# Patient Record
Sex: Female | Born: 1960 | Race: White | Hispanic: No | Marital: Married | Smoking: Current every day smoker
Health system: Southern US, Community
[De-identification: ages and names within clinical notes are randomized; demographics above are authoritative.]

## PROBLEM LIST (undated history)

## (undated) DIAGNOSIS — M1990 Unspecified osteoarthritis, unspecified site: Secondary | ICD-10-CM

## (undated) DIAGNOSIS — E559 Vitamin D deficiency, unspecified: Secondary | ICD-10-CM

## (undated) DIAGNOSIS — F419 Anxiety disorder, unspecified: Secondary | ICD-10-CM

## (undated) DIAGNOSIS — R011 Cardiac murmur, unspecified: Secondary | ICD-10-CM

## (undated) DIAGNOSIS — K219 Gastro-esophageal reflux disease without esophagitis: Secondary | ICD-10-CM

## (undated) DIAGNOSIS — I10 Essential (primary) hypertension: Secondary | ICD-10-CM

## (undated) HISTORY — DX: Unspecified osteoarthritis, unspecified site: M19.90

## (undated) HISTORY — DX: Essential (primary) hypertension: I10

## (undated) HISTORY — PX: BREAST CYST ASPIRATION: SHX578

## (undated) HISTORY — PX: BREAST BIOPSY: SHX20

## (undated) HISTORY — DX: Vitamin D deficiency, unspecified: E55.9

## (undated) HISTORY — PX: DILATION AND CURETTAGE OF UTERUS: SHX78

## (undated) HISTORY — PX: UTERINE FIBROID EMBOLIZATION: SHX825

## (undated) HISTORY — PX: TUBAL LIGATION: SHX77

## (undated) HISTORY — DX: Anxiety disorder, unspecified: F41.9

## (undated) HISTORY — PX: CHOLECYSTECTOMY: SHX55

## (undated) HISTORY — PX: SHOULDER ARTHROSCOPY: SHX128

## (undated) HISTORY — DX: Cardiac murmur, unspecified: R01.1

## (undated) HISTORY — PX: ABDOMINAL HYSTERECTOMY: SHX81

## (undated) HISTORY — PX: BREAST SURGERY: SHX581

---

## 2012-04-23 ENCOUNTER — Ambulatory Visit: Payer: Self-pay | Admitting: Family Medicine

## 2012-04-27 ENCOUNTER — Emergency Department: Payer: Self-pay | Admitting: Emergency Medicine

## 2012-04-27 LAB — URINALYSIS, COMPLETE
Bilirubin,UR: NEGATIVE
Glucose,UR: NEGATIVE mg/dL (ref 0–75)
Ketone: NEGATIVE
Ph: 5 (ref 4.5–8.0)
Protein: 30
Squamous Epithelial: 3
WBC UR: 2 /HPF (ref 0–5)

## 2012-04-27 LAB — COMPREHENSIVE METABOLIC PANEL
Alkaline Phosphatase: 95 U/L (ref 50–136)
Anion Gap: 7 (ref 7–16)
BUN: 9 mg/dL (ref 7–18)
Calcium, Total: 8.6 mg/dL (ref 8.5–10.1)
Chloride: 104 mmol/L (ref 98–107)
Co2: 25 mmol/L (ref 21–32)
EGFR (African American): >60
Glucose: 106 mg/dL — ABNORMAL HIGH (ref 65–99)
Osmolality: 271 (ref 275–301)
Potassium: 3.3 mmol/L — ABNORMAL LOW (ref 3.5–5.1)
SGOT(AST): 32 U/L (ref 15–37)
SGPT (ALT): 32 U/L (ref 12–78)

## 2012-04-27 LAB — CBC
HCT: 45.3 % (ref 35.0–47.0)
HGB: 15.3 g/dL (ref 12.0–16.0)
MCH: 29.6 pg (ref 26.0–34.0)
MCHC: 33.9 g/dL (ref 32.0–36.0)
MCV: 87 fL (ref 80–100)
RBC: 5.19 10*6/uL (ref 3.80–5.20)
WBC: 4.7 10*3/uL (ref 3.6–11.0)

## 2014-06-03 ENCOUNTER — Ambulatory Visit
Admit: 2014-06-03 | Disposition: A | Discharge status: Home / Self Care | Payer: Self-pay | Attending: Orthopedic Surgery | Admitting: Orthopedic Surgery

## 2014-06-06 ENCOUNTER — Ambulatory Visit
Admit: 2014-06-06 | Disposition: A | Discharge status: Home / Self Care | Payer: Self-pay | Attending: Family Medicine | Admitting: Family Medicine

## 2014-07-26 DIAGNOSIS — M5136 Other intervertebral disc degeneration, lumbar region: Secondary | ICD-10-CM | POA: Insufficient documentation

## 2014-07-26 DIAGNOSIS — M51369 Other intervertebral disc degeneration, lumbar region without mention of lumbar back pain or lower extremity pain: Secondary | ICD-10-CM | POA: Insufficient documentation

## 2014-08-30 ENCOUNTER — Encounter: Payer: Self-pay | Admitting: Family Medicine

## 2014-08-30 ENCOUNTER — Ambulatory Visit (INDEPENDENT_AMBULATORY_CARE_PROVIDER_SITE_OTHER): Payer: Managed Care, Other (non HMO) | Admitting: Family Medicine

## 2014-08-30 VITALS — BP 120/69 | HR 92 | Temp 98.2°F | Resp 18 | Ht 63.0 in | Wt 191.2 lb

## 2014-08-30 DIAGNOSIS — F419 Anxiety disorder, unspecified: Secondary | ICD-10-CM

## 2014-08-30 DIAGNOSIS — M2550 Pain in unspecified joint: Secondary | ICD-10-CM | POA: Diagnosis not present

## 2014-08-30 DIAGNOSIS — F329 Major depressive disorder, single episode, unspecified: Secondary | ICD-10-CM

## 2014-08-30 DIAGNOSIS — I10 Essential (primary) hypertension: Secondary | ICD-10-CM

## 2014-08-30 DIAGNOSIS — F418 Other specified anxiety disorders: Secondary | ICD-10-CM | POA: Diagnosis not present

## 2014-08-30 DIAGNOSIS — R011 Cardiac murmur, unspecified: Secondary | ICD-10-CM | POA: Insufficient documentation

## 2014-08-30 DIAGNOSIS — E785 Hyperlipidemia, unspecified: Secondary | ICD-10-CM | POA: Insufficient documentation

## 2014-08-30 MED ORDER — AMLODIPINE BESYLATE 10 MG PO TABS: 10.0000 mg | ORAL_TABLET | Freq: Every day | ORAL | Status: DC

## 2014-08-30 MED ORDER — FLUOXETINE HCL 40 MG PO CAPS: 40.0000 mg | ORAL_CAPSULE | Freq: Every day | ORAL | Status: DC

## 2014-08-30 NOTE — Progress Notes (Signed)
Name: Nichole Wells   MRN: [id number]    DOB: 03-Feb-1961   Date:08/30/2014       Progress Note  Subjective  Chief Complaint  Chief Complaint  Patient presents with  . Back Pain  . Hypertension  . Pain    Joint pain (all over)    Hypertension This is a chronic problem. Associated symptoms include malaise/fatigue and palpitations (occasional palpitations). Pertinent negatives include no chest pain or headaches. Past treatments include calcium channel blockers, diuretics and beta blockers. The current treatment provides significant improvement. There is no history of kidney disease, CAD/MI or CVA.  Depression Pt. Is here for follow up of Depression. Initially diagnosed over 15 years ago. Currently on Fluoxetine 40 mg daily for 7-8 years. Her symptoms included hopelessness, crying spells, anxiety, and anhedonia. These are now controlled on medication. No side effects reported.  Generalized Joint Pain Pt. describes symptoms of joint aches and pains all over including both hands, both wrists, both feet, and lower back. She feels as if ' my body is attacking my joints' and as if ' I am coming down with something'. She denies any fevers or chills but does complain of fatigue.  Past Medical History  Diagnosis Date  . Heart murmur   . Anxiety   . Hypertension     Past Surgical History  Procedure Laterality Date  . Breast surgery    . Dilation and curettage of uterus    . Cholecystectomy    . Uterine fibroid embolization    . Shoulder arthroscopy Right   . Tubal ligation      Family History  Problem Relation Age of Onset  . Cancer Mother     cervical, lung  . Heart disease Father   . Hypertension Father     History   Social History  . Marital Status: Married    Spouse Name: N/A  . Number of Children: N/A  . Years of Education: N/A   Occupational History  . Not on file.   Social History Main Topics  . Smoking status: Current Every Day Smoker -- 0.50 packs/day    Types:  Cigarettes  . Smokeless tobacco: Never Used  . Alcohol Use: 0.0 oz/week    0 Standard drinks or equivalent per week     Comment: Occasional  . Drug Use: No  . Sexual Activity: Yes     Comment: occasional   Other Topics Concern  . Not on file   Social History Narrative  . No narrative on file     Current outpatient prescriptions:  .  amLODipine (NORVASC) 10 MG tablet, Take 1 tablet by mouth daily., Disp: , Rfl: 5 .  FLUoxetine (PROZAC) 40 MG capsule, Take 1 capsule by mouth daily., Disp: , Rfl: 5 .  hydrochlorothiazide (HYDRODIURIL) 12.5 MG tablet, Take 1 tablet by mouth daily., Disp: , Rfl: 5 .  hydrOXYzine (ATARAX/VISTARIL) 25 MG tablet, Take 1 tablet by mouth daily., Disp: , Rfl: 5 .  metoprolol succinate (TOPROL-XL) 50 MG 24 hr tablet, Take 1 tablet by mouth daily., Disp: , Rfl: 5 .  naproxen sodium (ANAPROX) 550 MG tablet, TK 1 T PO Q 12 H, Disp: , Rfl: 0 .  pravastatin (PRAVACHOL) 40 MG tablet, Take 1 tablet by mouth daily., Disp: , Rfl: 5  Allergies  Allergen Reactions  . Penicillin G Hives  . Penicillins      Review of Systems  Constitutional: Positive for malaise/fatigue. Negative for fever, chills and weight loss.  Cardiovascular: Positive  for palpitations (occasional palpitations). Negative for chest pain.  Musculoskeletal: Positive for back pain and joint pain.  Neurological: Negative for headaches.  Psychiatric/Behavioral: Positive for depression. The patient is nervous/anxious.       Objective  Filed Vitals:   08/30/14 1358  BP: 120/69  Pulse: 92  Temp: 98.2 F (36.8 C)  TempSrc: Oral  Resp: 18  Height: 5' 3" (1.6 m)  Weight: 191 lb 3.2 oz (86.728 kg)  SpO2: 95%    Physical Exam  Constitutional: She is oriented to person, place, and time and well-developed, well-nourished, and in no distress.  HENT:  Head: Normocephalic and atraumatic.  Cardiovascular: Normal rate and regular rhythm.   No murmur heard. Pulmonary/Chest: Effort normal.    Abdominal: Soft. Bowel sounds are normal.  Musculoskeletal:       Back:       Hands:      Feet:  Neurological: She is alert and oriented to person, place, and time.  Skin: Skin is warm and dry.  Psychiatric: Memory, affect and judgment normal.  Nursing note and vitals reviewed.    Assessment & Plan 1. Essential hypertension BP is stable on present therapy. - amLODipine (NORVASC) 10 MG tablet; Take 1 tablet (10 mg total) by mouth daily.  Dispense: 90 tablet; Refill: 0  2. Anxiety and depression Stable on present therapy. - FLUoxetine (PROZAC) 40 MG capsule; Take 1 capsule (40 mg total) by mouth daily.  Dispense: 90 capsule; Refill: 0  3. Pain in joint involving multiple sites Order labs and X rays for evaluation of multiple joint pains. Pt. Has family history of autoimmune disease. - DG Hand Complete Left; Future - DG Foot Complete Left; Future - DG Hand Complete Right; Future - Antinuclear Antib (ANA) - Rheumatoid Factor - Sed Rate (ESR) - CBC w/Diff - C-reactive protein    Syed Asad A. Shah [facility] 08/30/2014 2:35 PM  

## 2014-08-31 ENCOUNTER — Telehealth: Payer: Self-pay | Admitting: Family Medicine

## 2014-08-31 ENCOUNTER — Ambulatory Visit
Admission: RE | Admit: 2014-08-31 | Discharge: 2014-08-31 | Disposition: A | Discharge status: Home / Self Care | Payer: Managed Care, Other (non HMO) | Source: Ambulatory Visit | Attending: Family Medicine | Admitting: Family Medicine

## 2014-08-31 DIAGNOSIS — M2550 Pain in unspecified joint: Secondary | ICD-10-CM

## 2014-08-31 LAB — CBC WITH DIFFERENTIAL/PLATELET
BASOS: 0 %
Basophils Absolute: 0 10*3/uL (ref 0.0–0.2)
EOS (ABSOLUTE): 0.1 10*3/uL (ref 0.0–0.4)
EOS: 1 %
HEMATOCRIT: 42.8 % (ref 34.0–46.6)
Hemoglobin: 14.3 g/dL (ref 11.1–15.9)
IMMATURE GRANS (ABS): 0 10*3/uL (ref 0.0–0.1)
Immature Granulocytes: 0 %
LYMPHS ABS: 2.7 10*3/uL (ref 0.7–3.1)
LYMPHS: 25 %
MCH: 29.8 pg (ref 26.6–33.0)
MCHC: 33.4 g/dL (ref 31.5–35.7)
MCV: 89 fL (ref 79–97)
MONOCYTES: 10 %
Monocytes Absolute: 1 10*3/uL — ABNORMAL HIGH (ref 0.1–0.9)
NEUTROS PCT: 64 %
Neutrophils Absolute: 7 10*3/uL (ref 1.4–7.0)
Platelets: 387 10*3/uL — ABNORMAL HIGH (ref 150–379)
RBC: 4.8 x10E6/uL (ref 3.77–5.28)
RDW: 13.8 % (ref 12.3–15.4)
WBC: 10.9 10*3/uL — ABNORMAL HIGH (ref 3.4–10.8)

## 2014-08-31 LAB — SEDIMENTATION RATE: Sed Rate: 4 mm/hr (ref 0–40)

## 2014-08-31 LAB — ANA: Anti Nuclear Antibody(ANA): NEGATIVE

## 2014-08-31 LAB — C-REACTIVE PROTEIN: CRP: 10.9 mg/L — AB (ref 0.0–4.9)

## 2014-08-31 LAB — RHEUMATOID FACTOR: RHEUMATOID FACTOR: 10.7 [IU]/mL (ref 0.0–13.9)

## 2014-08-31 NOTE — Telephone Encounter (Signed)
PT NEEDS ALL THE MEDS THAT YOU SENT FOR HER YESTERDAY TO [facility]UE TO INSURANCE CHANGE SHE DID NOT KNOW ABOUT. ALL OF THESE NEED TO BE 90 DAYS SUPPLY.  ALSO PT NEEDS REFILLS ON PAXIL ( NEEDS GENERIC) AND THE OTHER ONE IS BLOOD PRESSURE MEDS . SHE SAID TO MAKE IT EASIER ALL OF HER RX WILL NEED NEW RX'S FOR 90 DAY SUPPLY DUE TO INSURANCE CHANGE. [facility]

## 2014-09-01 ENCOUNTER — Other Ambulatory Visit: Payer: Self-pay | Admitting: Family Medicine

## 2014-09-01 DIAGNOSIS — M2550 Pain in unspecified joint: Secondary | ICD-10-CM

## 2014-09-04 ENCOUNTER — Other Ambulatory Visit: Payer: Self-pay | Admitting: Family Medicine

## 2014-09-04 DIAGNOSIS — F419 Anxiety disorder, unspecified: Secondary | ICD-10-CM

## 2014-09-04 DIAGNOSIS — I10 Essential (primary) hypertension: Secondary | ICD-10-CM

## 2014-09-04 DIAGNOSIS — E785 Hyperlipidemia, unspecified: Secondary | ICD-10-CM

## 2014-09-04 DIAGNOSIS — L299 Pruritus, unspecified: Secondary | ICD-10-CM

## 2014-09-04 DIAGNOSIS — F329 Major depressive disorder, single episode, unspecified: Secondary | ICD-10-CM

## 2014-09-04 NOTE — Telephone Encounter (Signed)
Medication has been refilled and sent to [place]

## 2014-09-04 NOTE — Telephone Encounter (Signed)
Patient called over the weekend requesting medication refills be sent to another pharmacy due to insurance. She was given a 7 day supply per Dr. Brigitte Pulse I am refilling medication and sending to [facility] [address]

## 2014-09-05 ENCOUNTER — Telehealth: Payer: Self-pay | Admitting: Family Medicine

## 2014-09-05 MED ORDER — HYDROCHLOROTHIAZIDE 12.5 MG PO TABS: 12.5000 mg | ORAL_TABLET | Freq: Every day | ORAL | Status: DC

## 2014-09-05 MED ORDER — PRAVASTATIN SODIUM 40 MG PO TABS: 40.0000 mg | ORAL_TABLET | Freq: Every day | ORAL | Status: DC

## 2014-09-05 MED ORDER — HYDROXYZINE HCL 25 MG PO TABS: 25.0000 mg | ORAL_TABLET | Freq: Every day | ORAL | Status: DC

## 2014-09-05 MED ORDER — FLUOXETINE HCL 40 MG PO CAPS: 40.0000 mg | ORAL_CAPSULE | Freq: Every day | ORAL | Status: DC

## 2014-09-05 MED ORDER — AMLODIPINE BESYLATE 10 MG PO TABS: 10.0000 mg | ORAL_TABLET | Freq: Every day | ORAL | Status: DC

## 2014-09-05 MED ORDER — METOPROLOL SUCCINATE ER 50 MG PO TB24: 50.0000 mg | ORAL_TABLET | Freq: Every day | ORAL | Status: DC

## 2014-09-05 NOTE — Telephone Encounter (Signed)
THIS PATIENT AND HER HUSBAND ARE PATIENTS OF [facility] AND THEY ARE ASKING TO SWITCH TO YOU INSTEAD. PLEASE LET ME KNOW IF THIS IS POSSIBLE SO THAT I CAN CALL AND LET THEM KNOW. SAYS THAT WHEN THEY NEED THINGS JUST DOES NOT SEEM TO GET DONE.

## 2014-09-06 ENCOUNTER — Telehealth: Payer: Self-pay | Admitting: Family Medicine

## 2014-09-06 NOTE — Telephone Encounter (Signed)
Cathy from [facility] (Rheumotoid) has scheduled the patient for Sept 1 @ 8:30. They are needing the xray report anything pertaining to joint pain in hand and feet. Please fax to [phone]

## 2014-09-06 NOTE — Telephone Encounter (Signed)
As long as they understand I do not manage chronic pain medication refills I will take over their care.  I will need to see them both at some point to transition care to me in 30 min appointments each if they have already been seen in the [facility] system.

## 2014-09-06 NOTE — Telephone Encounter (Signed)
Faxed x-rays to [facility]-rheumatology as requested to [phone]

## 2014-09-07 NOTE — Telephone Encounter (Signed)
SPOKE WITH JOHN THE HUSBAND AND HE IS GOING TO CALL ME BACK AND SET HIM AND HIS WIFE UP WITH AN APPT TO TRANS ION CARE WITH YOU. WAS MADE AWARE OF THE CHRONIC PAIN MEDICATION AND HE SAID THAT WAS FINE THAT HIS WIFE IS JUST ON ANXIETY MEDS.

## 2014-09-20 ENCOUNTER — Ambulatory Visit: Payer: Managed Care, Other (non HMO) | Admitting: Family Medicine

## 2014-11-29 ENCOUNTER — Other Ambulatory Visit: Payer: Self-pay | Admitting: Family Medicine

## 2015-02-24 ENCOUNTER — Other Ambulatory Visit: Payer: Self-pay | Admitting: Family Medicine

## 2015-03-13 ENCOUNTER — Telehealth: Payer: Self-pay | Admitting: Family Medicine

## 2015-03-13 MED ORDER — AMLODIPINE BESYLATE 10 MG PO TABS: 10.0000 mg | ORAL_TABLET | Freq: Every day | ORAL | Status: DC

## 2015-03-13 MED ORDER — HYDROCHLOROTHIAZIDE 12.5 MG PO TABS: 12.5000 mg | ORAL_TABLET | Freq: Every day | ORAL | Status: DC

## 2015-03-13 MED ORDER — METOPROLOL SUCCINATE ER 50 MG PO TB24: 50.0000 mg | ORAL_TABLET | Freq: Every day | ORAL | Status: DC

## 2015-03-13 NOTE — Telephone Encounter (Signed)
Medication has been refilled and [facility] [address] 

## 2015-03-13 NOTE — Telephone Encounter (Signed)
Patient called and scheduled appointment for 03-27-15 the first available time that she can come in, however she will be completely out of all her blood pressure medications. Is it possible to give her enough until her appointment date? If so please send [facility]

## 2015-03-27 ENCOUNTER — Encounter: Payer: Self-pay | Admitting: Family Medicine

## 2015-03-27 ENCOUNTER — Ambulatory Visit (INDEPENDENT_AMBULATORY_CARE_PROVIDER_SITE_OTHER): Payer: Managed Care, Other (non HMO) | Admitting: Family Medicine

## 2015-03-27 VITALS — BP 120/77 | HR 69 | Temp 98.6°F | Resp 17 | Ht 63.0 in | Wt 186.5 lb

## 2015-03-27 DIAGNOSIS — F329 Major depressive disorder, single episode, unspecified: Secondary | ICD-10-CM

## 2015-03-27 DIAGNOSIS — E785 Hyperlipidemia, unspecified: Secondary | ICD-10-CM

## 2015-03-27 DIAGNOSIS — F32A Depression, unspecified: Secondary | ICD-10-CM

## 2015-03-27 DIAGNOSIS — G4700 Insomnia, unspecified: Secondary | ICD-10-CM

## 2015-03-27 DIAGNOSIS — F419 Anxiety disorder, unspecified: Secondary | ICD-10-CM

## 2015-03-27 DIAGNOSIS — I10 Essential (primary) hypertension: Secondary | ICD-10-CM | POA: Diagnosis not present

## 2015-03-27 DIAGNOSIS — F418 Other specified anxiety disorders: Secondary | ICD-10-CM | POA: Diagnosis not present

## 2015-03-27 MED ORDER — HYDROCHLOROTHIAZIDE 12.5 MG PO TABS: 12.5000 mg | ORAL_TABLET | Freq: Every day | ORAL | Status: DC

## 2015-03-27 MED ORDER — SUVOREXANT 10 MG PO TABS: 1.0000 | ORAL_TABLET | Freq: Every day | ORAL | Status: DC

## 2015-03-27 MED ORDER — AMLODIPINE BESYLATE 10 MG PO TABS: 10.0000 mg | ORAL_TABLET | Freq: Every day | ORAL | Status: DC

## 2015-03-27 MED ORDER — PRAVASTATIN SODIUM 40 MG PO TABS: 40.0000 mg | ORAL_TABLET | Freq: Every day | ORAL | Status: DC

## 2015-03-27 MED ORDER — METOPROLOL SUCCINATE ER 50 MG PO TB24: 50.0000 mg | ORAL_TABLET | Freq: Every day | ORAL | Status: DC

## 2015-03-27 MED ORDER — FLUOXETINE HCL 40 MG PO CAPS: 40.0000 mg | ORAL_CAPSULE | Freq: Every day | ORAL | Status: DC

## 2015-03-27 NOTE — Progress Notes (Signed)
Name: Nichole Wells   MRN: [id number]    DOB: [birth year]   Date:03/27/2015       Progress Note  Subjective  Chief Complaint  Chief Complaint  Patient presents with  . Medication Refill    amlodipine 10 mg / hydrochlorothiazide 12.5 mg / metoprolol 50 mg / fluoxtine 40 mg / hydroxyzine 25 mg / pravastatin 40 mg     Hypertension This is a chronic problem. The problem is controlled. Pertinent negatives include no blurred vision, chest pain, headaches, palpitations or shortness of breath. Past treatments include diuretics, calcium channel blockers and beta blockers. There is no history of kidney disease or CAD/MI.  Hyperlipidemia This is a chronic problem. The problem is controlled. Pertinent negatives include no chest pain, leg pain, myalgias or shortness of breath. Current antihyperlipidemic treatment includes statins.  Insomnia Primary symptoms: sleep disturbance, difficulty falling asleep.  The onset quality is gradual. Past treatments include medication. Typical bedtime:  Shift work-varies.  PMH includes: hypertension, depression.  Depression        This is a chronic problem.The problem is unchanged.  Associated symptoms include insomnia.  Associated symptoms include no myalgias and no headaches.  Past treatments include SSRIs - Selective serotonin reuptake inhibitors.  Compliance with treatment is good.    Past Medical History  Diagnosis Date  . Heart murmur   . Anxiety   . Hypertension     Past Surgical History  Procedure Laterality Date  . Breast surgery    . Dilation and curettage of uterus    . Cholecystectomy    . Uterine fibroid embolization    . Shoulder arthroscopy Right   . Tubal ligation      Family History  Problem Relation Age of Onset  . Cancer Mother     cervical, lung  . Heart disease Father   . Hypertension Father     Social History   Social History  . Marital Status: Married    Spouse Name: N/A  . Number of Children: N/A  . Years of Education:  N/A   Occupational History  . Not on file.   Social History Main Topics  . Smoking status: Current Every Day Smoker -- 0.50 packs/day    Types: Cigarettes  . Smokeless tobacco: Never Used  . Alcohol Use: 0.0 oz/week    0 Standard drinks or equivalent per week     Comment: Occasional  . Drug Use: No  . Sexual Activity: Yes     Comment: occasional   Other Topics Concern  . Not on file   Social History Narrative     Current outpatient prescriptions:  .  amLODipine (NORVASC) 10 MG tablet, Take 1 tablet (10 mg total) by mouth daily., Disp: 30 tablet, Rfl: 0 .  FLUoxetine (PROZAC) 40 MG capsule, TAKE 1 CAPSULE (40 MG TOTAL) BY MOUTH DAILY., Disp: 90 capsule, Rfl: 0 .  hydrochlorothiazide (HYDRODIURIL) 12.5 MG tablet, Take 1 tablet (12.5 mg total) by mouth daily., Disp: 30 tablet, Rfl: 0 .  hydrOXYzine (ATARAX/VISTARIL) 25 MG tablet, TAKE 1 TABLET (25 MG TOTAL) BY MOUTH DAILY., Disp: 90 tablet, Rfl: 0 .  metoprolol succinate (TOPROL-XL) 50 MG 24 hr tablet, Take 1 tablet (50 mg total) by mouth daily. Take with or immediately following a meal., Disp: 30 tablet, Rfl: 0 .  naproxen sodium (ANAPROX) 550 MG tablet, TK 1 T PO Q 12 H, Disp: , Rfl: 0 .  pravastatin (PRAVACHOL) 40 MG tablet, TAKE 1 TABLET (40 MG TOTAL) BY MOUTH  DAILY., Disp: 90 tablet, Rfl: 0  Allergies  Allergen Reactions  . Penicillin G Hives  . Penicillins      Review of Systems  Eyes: Negative for blurred vision.  Respiratory: Negative for shortness of breath.   Cardiovascular: Negative for chest pain and palpitations.  Musculoskeletal: Negative for myalgias.  Neurological: Negative for headaches.  Psychiatric/Behavioral: Positive for depression and sleep disturbance. The patient has insomnia.     Objective  Filed Vitals:   03/27/15 1101  BP: 120/77  Pulse: 69  Temp: 98.6 F (37 C)  TempSrc: Oral  Resp: 17  Height: 5\' 3"  (1.6 m)  Weight: 186 lb 8 oz (84.596 kg)  SpO2: 95%    Physical Exam   Constitutional: She is oriented to person, place, and time and well-developed, well-nourished, and in no distress.  HENT:  Head: Normocephalic and atraumatic.  Cardiovascular: Normal rate and regular rhythm.   Murmur heard. Pulmonary/Chest: Effort normal and breath sounds normal.  Neurological: She is alert and oriented to person, place, and time.  Psychiatric: Mood, memory, affect and judgment normal.  Nursing note and vitals reviewed.   Assessment & Plan  1. Essential hypertension BP stable and controlled on therapy - amLODipine (NORVASC) 10 MG tablet; Take 1 tablet (10 mg total) by mouth daily.  Dispense: 30 tablet; Refill: 0 - hydrochlorothiazide (HYDRODIURIL) 12.5 MG tablet; Take 1 tablet (12.5 mg total) by mouth daily.  Dispense: 30 tablet; Refill: 0 - metoprolol succinate (TOPROL-XL) 50 MG 24 hr tablet; Take 1 tablet (50 mg total) by mouth daily. Take with or immediately following a meal.  Dispense: 30 tablet; Refill: 0  2. Anxiety and depression  - FLUoxetine (PROZAC) 40 MG capsule; Take 1 capsule (40 mg total) by mouth daily.  Dispense: 90 capsule; Refill: 0  3. HLD (hyperlipidemia)  - pravastatin (PRAVACHOL) 40 MG tablet; Take 1 tablet (40 mg total) by mouth daily.  Dispense: 90 tablet; Refill: 0 - Lipid Profile - Comprehensive Metabolic Panel (CMET)  4. Insomnia DC hydroxyzine and will start on Belsomra. Educated on potential adverse effects. Follow-up in one month. - Suvorexant (BELSOMRA) 10 MG TABS; Take 1 tablet by mouth at bedtime.  Dispense: 30 tablet; Refill: 0   Maitri Schnoebelen [facility] 03/27/2015 11:15 AM

## 2015-03-30 ENCOUNTER — Telehealth: Payer: Self-pay | Admitting: Family Medicine

## 2015-03-30 DIAGNOSIS — G4700 Insomnia, unspecified: Secondary | ICD-10-CM

## 2015-03-30 NOTE — Telephone Encounter (Signed)
Pt states she was told to call her insurance to see what they will cover and she found out that her insurance will cover Zolpidem , Zaloeplon.

## 2015-04-02 NOTE — Telephone Encounter (Signed)
Routed to Dr. Shah for advice  

## 2015-04-03 MED ORDER — ZOLPIDEM TARTRATE 5 MG PO TABS: 5.0000 mg | ORAL_TABLET | Freq: Every evening | ORAL | Status: DC | PRN

## 2015-04-03 NOTE — Telephone Encounter (Signed)
Routed to Dr. Shah for call back  

## 2015-04-03 NOTE — Telephone Encounter (Signed)
Returned call and discussed Ambien for insomnia. Recommended that she should take Ambien for a short term (1-2 months duration). Prescription is ready for pickup

## 2015-04-03 NOTE — Telephone Encounter (Signed)
Patient returning your call.

## 2015-04-10 ENCOUNTER — Telehealth: Payer: Self-pay | Admitting: Family Medicine

## 2015-04-10 NOTE — Telephone Encounter (Signed)
Called and spoke with pharmacy and medication has been changed to a 90- day supply on 04/10/2015

## 2015-04-10 NOTE — Telephone Encounter (Signed)
Pt states Amlopodine, Metlprolol ER, Hydrochlerozide, and cholesterol were all sent in for 30 days and her insurance will not cover it unless its 90 days. Pt requesting these be resent for 90 days. [facility]

## 2015-04-11 LAB — COMPREHENSIVE METABOLIC PANEL
A/G RATIO: 2 (ref 1.1–2.5)
ALT: 13 IU/L (ref 0–32)
AST: 19 IU/L (ref 0–40)
Albumin: 4.3 g/dL (ref 3.5–5.5)
Alkaline Phosphatase: 84 IU/L (ref 39–117)
BUN/Creatinine Ratio: 19 (ref 9–23)
BUN: 11 mg/dL (ref 6–24)
Bilirubin Total: 0.3 mg/dL (ref 0.0–1.2)
CALCIUM: 9.6 mg/dL (ref 8.7–10.2)
CO2: 25 mmol/L (ref 18–29)
CREATININE: 0.57 mg/dL (ref 0.57–1.00)
Chloride: 99 mmol/L (ref 96–106)
GFR, EST AFRICAN AMERICAN: 123 mL/min/{1.73_m2} (ref >59)
GFR, EST NON AFRICAN AMERICAN: 107 mL/min/{1.73_m2} (ref >59)
GLOBULIN, TOTAL: 2.2 g/dL (ref 1.5–4.5)
Glucose: 106 mg/dL — ABNORMAL HIGH (ref 65–99)
POTASSIUM: 4.6 mmol/L (ref 3.5–5.2)
SODIUM: 137 mmol/L (ref 134–144)
TOTAL PROTEIN: 6.5 g/dL (ref 6.0–8.5)

## 2015-04-11 LAB — LIPID PANEL
CHOLESTEROL TOTAL: 166 mg/dL (ref 100–199)
Chol/HDL Ratio: 3.1 ratio units (ref 0.0–4.4)
HDL: 53 mg/dL (ref >39)
LDL CALC: 93 mg/dL (ref 0–99)
TRIGLYCERIDES: 100 mg/dL (ref 0–149)
VLDL Cholesterol Cal: 20 mg/dL (ref 5–40)

## 2015-04-14 ENCOUNTER — Other Ambulatory Visit: Payer: Self-pay | Admitting: Family Medicine

## 2015-04-18 ENCOUNTER — Telehealth: Payer: Self-pay | Admitting: Family Medicine

## 2015-04-18 ENCOUNTER — Telehealth: Payer: Self-pay

## 2015-04-18 DIAGNOSIS — I10 Essential (primary) hypertension: Secondary | ICD-10-CM

## 2015-04-18 MED ORDER — AMLODIPINE BESYLATE 10 MG PO TABS: 10.0000 mg | ORAL_TABLET | Freq: Every day | ORAL | Status: DC

## 2015-04-18 MED ORDER — METOPROLOL SUCCINATE ER 50 MG PO TB24: 50.0000 mg | ORAL_TABLET | Freq: Every day | ORAL | Status: DC

## 2015-04-18 MED ORDER — HYDROCHLOROTHIAZIDE 12.5 MG PO TABS: 12.5000 mg | ORAL_TABLET | Freq: Every day | ORAL | Status: DC

## 2015-04-18 NOTE — Telephone Encounter (Signed)
Medication has been refilled with a 90-day supply and sent to pharmacy, patient has been notified

## 2015-04-18 NOTE — Telephone Encounter (Signed)
PT IS ASKING THAT YOU ALL CALL HER ABOUT HER MEDS. SHE IS NEEDING 90 DAY SUPPLY FOR THE INS WILL NOT COVER IT UNLESS IT IS 90 DAY SUPPLY. SAYS THAT SHE HAS REQUESTED THIS SEVERAL TIMES AND IT HAS NOT BEEN DONE YET AND SHE IS HAVING TO PAY OUT OF POCKET.

## 2015-04-18 NOTE — Telephone Encounter (Signed)
Medication has been refilled with a 90-day supply and [facility] [address] As requested by pharmacy

## 2015-04-24 ENCOUNTER — Ambulatory Visit: Payer: Managed Care, Other (non HMO) | Admitting: Family Medicine

## 2015-04-29 ENCOUNTER — Emergency Department: Payer: Managed Care, Other (non HMO)

## 2015-04-29 ENCOUNTER — Emergency Department
Admission: EM | Admit: 2015-04-29 | Discharge: 2015-04-29 | Disposition: A | Discharge status: Home / Self Care | Payer: Managed Care, Other (non HMO) | Attending: Emergency Medicine | Admitting: Emergency Medicine

## 2015-04-29 ENCOUNTER — Encounter: Payer: Self-pay | Admitting: *Deleted

## 2015-04-29 DIAGNOSIS — R109 Unspecified abdominal pain: Secondary | ICD-10-CM

## 2015-04-29 DIAGNOSIS — R1033 Periumbilical pain: Secondary | ICD-10-CM | POA: Diagnosis not present

## 2015-04-29 DIAGNOSIS — Z79899 Other long term (current) drug therapy: Secondary | ICD-10-CM | POA: Insufficient documentation

## 2015-04-29 DIAGNOSIS — I10 Essential (primary) hypertension: Secondary | ICD-10-CM | POA: Diagnosis not present

## 2015-04-29 DIAGNOSIS — R1013 Epigastric pain: Secondary | ICD-10-CM | POA: Diagnosis not present

## 2015-04-29 DIAGNOSIS — Z880 Allergy status to penicillin: Secondary | ICD-10-CM | POA: Diagnosis not present

## 2015-04-29 DIAGNOSIS — E785 Hyperlipidemia, unspecified: Secondary | ICD-10-CM | POA: Diagnosis not present

## 2015-04-29 DIAGNOSIS — F17210 Nicotine dependence, cigarettes, uncomplicated: Secondary | ICD-10-CM | POA: Insufficient documentation

## 2015-04-29 LAB — CBC WITH DIFFERENTIAL/PLATELET
BASOS ABS: 0 10*3/uL (ref 0–0.1)
Basophils Relative: 0 %
EOS ABS: 0 10*3/uL (ref 0–0.7)
EOS PCT: 0 %
HCT: 39.1 % (ref 35.0–47.0)
Hemoglobin: 13.5 g/dL (ref 12.0–16.0)
Lymphocytes Relative: 6 %
Lymphs Abs: 0.5 10*3/uL — ABNORMAL LOW (ref 1.0–3.6)
MCH: 29.7 pg (ref 26.0–34.0)
MCHC: 34.4 g/dL (ref 32.0–36.0)
MCV: 86.2 fL (ref 80.0–100.0)
Monocytes Absolute: 0.6 10*3/uL (ref 0.2–0.9)
Monocytes Relative: 7 %
Neutro Abs: 7.5 10*3/uL — ABNORMAL HIGH (ref 1.4–6.5)
Neutrophils Relative %: 87 %
PLATELETS: 287 10*3/uL (ref 150–440)
RBC: 4.54 MIL/uL (ref 3.80–5.20)
RDW: 14.6 % — AB (ref 11.5–14.5)
WBC: 8.7 10*3/uL (ref 3.6–11.0)

## 2015-04-29 LAB — COMPREHENSIVE METABOLIC PANEL
ALK PHOS: 75 U/L (ref 38–126)
ALT: 19 U/L (ref 14–54)
ANION GAP: 8 (ref 5–15)
AST: 26 U/L (ref 15–41)
Albumin: 3.7 g/dL (ref 3.5–5.0)
BILIRUBIN TOTAL: 0.4 mg/dL (ref 0.3–1.2)
BUN: 9 mg/dL (ref 6–20)
CALCIUM: 8.8 mg/dL — AB (ref 8.9–10.3)
CO2: 24 mmol/L (ref 22–32)
Chloride: 98 mmol/L — ABNORMAL LOW (ref 101–111)
Creatinine, Ser: 0.58 mg/dL (ref 0.44–1.00)
GFR calc Af Amer: >60 mL/min (ref >60)
GLUCOSE: 101 mg/dL — AB (ref 65–99)
Potassium: 3.2 mmol/L — ABNORMAL LOW (ref 3.5–5.1)
SODIUM: 130 mmol/L — AB (ref 135–145)
TOTAL PROTEIN: 6.4 g/dL — AB (ref 6.5–8.1)

## 2015-04-29 LAB — LIPASE, BLOOD: LIPASE: 36 U/L (ref 11–51)

## 2015-04-29 MED ORDER — GI COCKTAIL ~~LOC~~
ORAL | Status: AC
  Administered 2015-04-29: 30 mL via ORAL
  Filled 2015-04-29: qty 30

## 2015-04-29 MED ORDER — DICYCLOMINE HCL 20 MG PO TABS: 20.0000 mg | ORAL_TABLET | Freq: Three times a day (TID) | ORAL | Status: DC | PRN

## 2015-04-29 MED ORDER — GI COCKTAIL ~~LOC~~
30.0000 mL | Freq: Once | ORAL | Status: AC
  Administered 2015-04-29: 30 mL via ORAL

## 2015-04-29 MED ORDER — SODIUM CHLORIDE 0.9 % IV BOLUS (SEPSIS)
1000.0000 mL | Freq: Once | INTRAVENOUS | Status: AC
  Administered 2015-04-29: 1000 mL via INTRAVENOUS

## 2015-04-29 MED ORDER — SUCRALFATE 1 G PO TABS: 1.0000 g | ORAL_TABLET | Freq: Four times a day (QID) | ORAL | Status: DC

## 2015-04-29 NOTE — ED Notes (Signed)
Patient transported to x-ray. ?

## 2015-04-29 NOTE — ED Notes (Signed)
Patient c/o sinus pressure and cough for one week and was treated with cough med and Doxycycline. Patient c/o now of upper abdominal pain, back pain and fever.

## 2015-04-29 NOTE — Discharge Instructions (Signed)
Please seek medical attention for any high fevers, chest pain, shortness of breath, change in behavior, persistent vomiting, bloody stool or any other new or concerning symptoms. ° ° °Abdominal Pain, Adult °Many things can cause belly (abdominal) pain. Most times, the belly pain is not dangerous. Many cases of belly pain can be watched and treated at home. °HOME CARE  °· Do not take medicines that help you go poop (laxatives) unless told to by your doctor. °· Only take medicine as told by your doctor. °· Eat or drink as told by your doctor. Your doctor will tell you if you should be on a special diet. °GET HELP IF: °· You do not know what is causing your belly pain. °· You have belly pain while you are sick to your stomach (nauseous) or have runny poop (diarrhea). °· You have pain while you pee or poop. °· Your belly pain wakes you up at night. °· You have belly pain that gets worse or better when you eat. °· You have belly pain that gets worse when you eat fatty foods. °· You have a fever. °GET HELP RIGHT AWAY IF:  °· The pain does not go away within 2 hours. °· You keep throwing up (vomiting). °· The pain changes and is only in the right or left part of the belly. °· You have bloody or tarry looking poop. °MAKE SURE YOU:  °· Understand these instructions. °· Will watch your condition. °· Will get help right away if you are not doing well or get worse. °  °This information is not intended to replace advice given to you by your health care provider. Make sure you discuss any questions you have with your health care provider. °  °Document Released: 07/16/2007 Document Revised: 02/17/2014 Document Reviewed: 10/06/2012 °Elsevier Interactive Patient Education ©2016 [employer] ° °

## 2015-04-29 NOTE — ED Notes (Signed)
Patient was seen at [facility] on March 12th. There they dx her with a sinus infection and an URI. Patient was put on doxycycline 100mg  (1 tab 2x daily) and hydrocodone-chlorphen susp (48mL Q12H). Patient states she thinks her abd pain is from this medication.

## 2015-04-29 NOTE — ED Provider Notes (Signed)
[facility] Emergency Department Provider Note  ____________________________________________  Time seen: ~1315  I have reviewed the triage vital signs and the nursing notes.   HISTORY  Chief Complaint Abdominal Pain   History limited by: Not Limited   HPI Nichole Wells is a 55 y.o. female who presents to the emergency department today because of concerns for abdominal pain. This pain is located in the periumbilical and epigastric region. She describes it as cramping. It will become severe. She states it started after she was placed on antibiotics for sinus and upper respiratory infection. She has had some associated nausea. She denies any vomiting. Denies any diarrhea or bloody stool. She has had cough with brownish phlegm. No chest pain. No shortness breath.    Past Medical History  Diagnosis Date  . Heart murmur   . Anxiety   . Hypertension     Patient Active Problem List   Diagnosis Date Noted  . Insomnia 03/27/2015  . Hypertension 08/30/2014  . Anxiety and depression 08/30/2014  . Cardiac murmur 08/30/2014  . HLD (hyperlipidemia) 08/30/2014  . Pain in joint involving multiple sites 08/30/2014  . DDD (degenerative disc disease), lumbar 07/26/2014    Past Surgical History  Procedure Laterality Date  . Breast surgery    . Dilation and curettage of uterus    . Cholecystectomy    . Uterine fibroid embolization    . Shoulder arthroscopy Right   . Tubal ligation      Current Outpatient Rx  Name  Route  Sig  Dispense  Refill  . amLODipine (NORVASC) 10 MG tablet   Oral   Take 1 tablet (10 mg total) by mouth daily.   90 tablet   0   . FLUoxetine (PROZAC) 40 MG capsule   Oral   Take 1 capsule (40 mg total) by mouth daily.   90 capsule   0   . hydrochlorothiazide (HYDRODIURIL) 12.5 MG tablet   Oral   Take 1 tablet (12.5 mg total) by mouth daily.   90 tablet   0   . metoprolol succinate (TOPROL-XL) 50 MG 24 hr tablet   Oral   Take  1 tablet (50 mg total) by mouth daily. Take with or immediately following a meal.   30 tablet   0   . metoprolol succinate (TOPROL-XL) 50 MG 24 hr tablet   Oral   Take 1 tablet (50 mg total) by mouth daily. Take with or immediately following a meal.   90 tablet   0     WE NEED 90 DAY SUPPLYS FOR ALL MEDS   . naproxen sodium (ANAPROX) 550 MG tablet      TK 1 T PO Q 12 H      0   . pravastatin (PRAVACHOL) 40 MG tablet   Oral   Take 1 tablet (40 mg total) by mouth daily.   90 tablet   0   . zolpidem (AMBIEN) 5 MG tablet   Oral   Take 1 tablet (5 mg total) by mouth at bedtime as needed for sleep.   30 tablet   0     Allergies Penicillin g and Penicillins  Family History  Problem Relation Age of Onset  . Cancer Mother     cervical, lung  . Heart disease Father   . Hypertension Father     Social History Social History  Substance Use Topics  . Smoking status: Current Every Day Smoker -- 0.50 packs/day    Types:  Cigarettes  . Smokeless tobacco: Never Used  . Alcohol Use: 0.0 oz/week    0 Standard drinks or equivalent per week     Comment: Occasional    Review of Systems  Constitutional: Negative for fever. Cardiovascular: Negative for chest pain. Respiratory: Negative for shortness of breath. Positive for cough Gastrointestinal: Positive for abdominal pain Neurological: Negative for headaches, focal weakness or numbness.  10-point ROS otherwise negative.  ____________________________________________   PHYSICAL EXAM:  VITAL SIGNS: ED Triage Vitals  Enc Vitals Group     BP 04/29/15 1252 151/72 mmHg     Pulse Rate 04/29/15 1252 92     Resp 04/29/15 1252 18     Temp 04/29/15 1252 98.5 F (36.9 C)     Temp Source 04/29/15 1252 Oral     SpO2 04/29/15 1252 96 %     Weight 04/29/15 1252 183 lb (83.008 kg)     Height 04/29/15 1252 5\' 2"  (1.575 m)     Head Cir --      Peak Flow --      Pain Score 04/29/15 1255 8   Constitutional: Alert and oriented.  Well appearing and in no distress. Eyes: Conjunctivae are normal. PERRL. Normal extraocular movements. ENT   Head: Normocephalic and atraumatic.   Nose: No congestion/rhinnorhea.   Mouth/Throat: Mucous membranes are moist.   Neck: No stridor. Hematological/Lymphatic/Immunilogical: No cervical lymphadenopathy. Cardiovascular: Normal rate, regular rhythm.  No murmurs, rubs, or gallops. Respiratory: Normal respiratory effort without tachypnea nor retractions. Breath sounds are clear and equal bilaterally. No wheezes/rales/rhonchi. Gastrointestinal: Soft. Tender to palpation in the epigastric periumbilical regions. No rebound. No guarding. Genitourinary: Deferred Musculoskeletal: Normal range of motion in all extremities. No joint effusions.  No lower extremity tenderness nor edema. Neurologic:  Normal speech and language. No gross focal neurologic deficits are appreciated.  Skin:  Skin is warm, dry and intact. No rash noted. Psychiatric: Mood and affect are normal. Speech and behavior are normal. Patient exhibits appropriate insight and judgment.  ____________________________________________    LABS (pertinent positives/negatives)  Labs Reviewed  CBC WITH DIFFERENTIAL/PLATELET - Abnormal; Notable for the following:    RDW 14.6 (*)    Neutro Abs 7.5 (*)    Lymphs Abs 0.5 (*)    All other components within normal limits  COMPREHENSIVE METABOLIC PANEL - Abnormal; Notable for the following:    Sodium 130 (*)    Potassium 3.2 (*)    Chloride 98 (*)    Glucose, Bld 101 (*)    Calcium 8.8 (*)    Total Protein 6.4 (*)    All other components within normal limits  LIPASE, BLOOD     ____________________________________________   EKG  I, Nance Pear, attending physician, personally viewed and interpreted this EKG  EKG Time: 1256 Rate: 91 Rhythm: normal sinus rhythm Axis: normal Intervals: qtc 452 QRS: narrow ST changes: no st elevation Impression: normal  ekg  ____________________________________________    RADIOLOGY  CXR IMPRESSION: No acute abnormality. Mild chronic bronchitic changes.  ____________________________________________   PROCEDURES  Procedure(s) performed: None  Critical Care performed: No  ____________________________________________   INITIAL IMPRESSION / ASSESSMENT AND PLAN / ED COURSE  Pertinent labs & imaging results that were available during my care of the patient were reviewed by me and considered in my medical decision making (see chart for details).  Patient presented to the emergency department today because of concerns for abdominal pain. She recently started doxycycline for sinusitis and upper respiratory infection. Blood work here without  any leukocytosis, elevated lipase were elevated liver enzymes. Patient did have some improvement with GI cocktail. This point I think likely gastritis secondary to antibiotic use. I doubt intra-abdominal infection. Will discharge home with Bentyl and sucralfate.  ____________________________________________   FINAL CLINICAL IMPRESSION(S) / ED DIAGNOSES  Final diagnoses:  Abdominal pain, unspecified abdominal location     Nance Pear, MD 04/29/15 1533

## 2015-06-21 ENCOUNTER — Other Ambulatory Visit: Payer: Self-pay | Admitting: Family Medicine

## 2015-07-02 ENCOUNTER — Emergency Department
Admission: EM | Admit: 2015-07-02 | Discharge: 2015-07-02 | Disposition: A | Discharge status: Home / Self Care | Payer: Managed Care, Other (non HMO) | Attending: Emergency Medicine | Admitting: Emergency Medicine

## 2015-07-02 ENCOUNTER — Encounter: Payer: Self-pay | Admitting: Emergency Medicine

## 2015-07-02 DIAGNOSIS — F329 Major depressive disorder, single episode, unspecified: Secondary | ICD-10-CM | POA: Insufficient documentation

## 2015-07-02 DIAGNOSIS — E785 Hyperlipidemia, unspecified: Secondary | ICD-10-CM | POA: Diagnosis not present

## 2015-07-02 DIAGNOSIS — I10 Essential (primary) hypertension: Secondary | ICD-10-CM | POA: Diagnosis not present

## 2015-07-02 DIAGNOSIS — Y9389 Activity, other specified: Secondary | ICD-10-CM | POA: Insufficient documentation

## 2015-07-02 DIAGNOSIS — S61215A Laceration without foreign body of left ring finger without damage to nail, initial encounter: Secondary | ICD-10-CM | POA: Insufficient documentation

## 2015-07-02 DIAGNOSIS — M5136 Other intervertebral disc degeneration, lumbar region: Secondary | ICD-10-CM | POA: Diagnosis not present

## 2015-07-02 DIAGNOSIS — Z79899 Other long term (current) drug therapy: Secondary | ICD-10-CM | POA: Diagnosis not present

## 2015-07-02 DIAGNOSIS — Y929 Unspecified place or not applicable: Secondary | ICD-10-CM | POA: Insufficient documentation

## 2015-07-02 DIAGNOSIS — S61218A Laceration without foreign body of other finger without damage to nail, initial encounter: Secondary | ICD-10-CM

## 2015-07-02 DIAGNOSIS — F17210 Nicotine dependence, cigarettes, uncomplicated: Secondary | ICD-10-CM | POA: Insufficient documentation

## 2015-07-02 DIAGNOSIS — Y999 Unspecified external cause status: Secondary | ICD-10-CM | POA: Diagnosis not present

## 2015-07-02 DIAGNOSIS — Z791 Long term (current) use of non-steroidal anti-inflammatories (NSAID): Secondary | ICD-10-CM | POA: Insufficient documentation

## 2015-07-02 DIAGNOSIS — W4904XA Ring or other jewelry causing external constriction, initial encounter: Secondary | ICD-10-CM | POA: Insufficient documentation

## 2015-07-02 MED ORDER — BACITRACIN ZINC 500 UNIT/GM EX OINT
TOPICAL_OINTMENT | CUTANEOUS | Status: AC
  Filled 2015-07-02: qty 0.9

## 2015-07-02 MED ORDER — LIDOCAINE HCL (PF) 1 % IJ SOLN
INTRAMUSCULAR | Status: AC
  Filled 2015-07-02: qty 5

## 2015-07-02 MED ORDER — TETANUS-DIPHTH-ACELL PERTUSSIS 5-2.5-18.5 LF-MCG/0.5 IM SUSP
0.5000 mL | Freq: Once | INTRAMUSCULAR | Status: AC
  Administered 2015-07-02: 0.5 mL via INTRAMUSCULAR

## 2015-07-02 MED ORDER — LIDOCAINE HCL (PF) 1 % IJ SOLN: 5.0000 mL | Freq: Once | INTRAMUSCULAR | Status: DC

## 2015-07-02 MED ORDER — BACITRACIN ZINC 500 UNIT/GM EX OINT: 1.0000 "application " | TOPICAL_OINTMENT | Freq: Two times a day (BID) | CUTANEOUS | Status: DC

## 2015-07-02 MED ORDER — TETANUS-DIPHTH-ACELL PERTUSSIS 5-2.5-18.5 LF-MCG/0.5 IM SUSP
INTRAMUSCULAR | Status: AC
  Administered 2015-07-02: 0.5 mL via INTRAMUSCULAR
  Filled 2015-07-02: qty 0.5
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
## 2015-07-02 NOTE — ED Provider Notes (Signed)
[facility] Emergency Department Provider Note  ____________________________________________  Time seen: Approximately 8:26 AM  I have reviewed the triage vital signs and the nursing notes.   HISTORY  Chief Complaint Laceration   HPI Nichole Wells is a 55 y.o. female who presents to the emergency department for evaluation of a laceration that she sustained this morning. She went to take a frozen beer bottle out of the freezer and it exploded in her hand. She has a laceration to the medial aspect of her left ring finger. Bleeding is well controlled. Tetanus is possibly out of date.  Past Medical History  Diagnosis Date  . Heart murmur   . Anxiety   . Hypertension     Patient Active Problem List   Diagnosis Date Noted  . Insomnia 03/27/2015  . Hypertension 08/30/2014  . Anxiety and depression 08/30/2014  . Cardiac murmur 08/30/2014  . HLD (hyperlipidemia) 08/30/2014  . Pain in joint involving multiple sites 08/30/2014  . DDD (degenerative disc disease), lumbar 07/26/2014    Past Surgical History  Procedure Laterality Date  . Breast surgery    . Dilation and curettage of uterus    . Cholecystectomy    . Uterine fibroid embolization    . Shoulder arthroscopy Right   . Tubal ligation      Current Outpatient Rx  Name  Route  Sig  Dispense  Refill  . amLODipine (NORVASC) 10 MG tablet   Oral   Take 1 tablet (10 mg total) by mouth daily.   90 tablet   0   . dicyclomine (BENTYL) 20 MG tablet   Oral   Take 1 tablet (20 mg total) by mouth 3 (three) times daily as needed (abdominal pain).   30 tablet   0   . FLUoxetine (PROZAC) 40 MG capsule      TAKE 1 CAPSULE (40 MG TOTAL) BY MOUTH DAILY.   90 capsule   0   . hydrochlorothiazide (HYDRODIURIL) 12.5 MG tablet   Oral   Take 1 tablet (12.5 mg total) by mouth daily.   90 tablet   0   . metoprolol succinate (TOPROL-XL) 50 MG 24 hr tablet   Oral   Take 1 tablet (50 mg total) by mouth  daily. Take with or immediately following a meal.   30 tablet   0   . metoprolol succinate (TOPROL-XL) 50 MG 24 hr tablet   Oral   Take 1 tablet (50 mg total) by mouth daily. Take with or immediately following a meal.   90 tablet   0     WE NEED 90 DAY SUPPLYS FOR ALL MEDS   . naproxen sodium (ANAPROX) 550 MG tablet      TK 1 T PO Q 12 H      0   . pravastatin (PRAVACHOL) 40 MG tablet      TAKE 1 TABLET (40 MG TOTAL) BY MOUTH DAILY.   90 tablet   0   . sucralfate (CARAFATE) 1 g tablet   Oral   Take 1 tablet (1 g total) by mouth 4 (four) times daily.   40 tablet   0   . zolpidem (AMBIEN) 5 MG tablet   Oral   Take 1 tablet (5 mg total) by mouth at bedtime as needed for sleep.   30 tablet   0     Allergies Penicillin g and Penicillins  Family History  Problem Relation Age of Onset  . Cancer Mother  cervical, lung  . Heart disease Father   . Hypertension Father     Social History Social History  Substance Use Topics  . Smoking status: Current Every Day Smoker -- 0.50 packs/day    Types: Cigarettes  . Smokeless tobacco: Never Used  . Alcohol Use: 0.0 oz/week    0 Standard drinks or equivalent per week     Comment: Occasional    Review of Systems  Constitutional: Negative for fever/chills Respiratory: Negative for shortness of breath. Musculoskeletal: Positive for pain. Skin: Positive for laceration Neurological: Negative for numbness. ____________________________________________   PHYSICAL EXAM:  VITAL SIGNS:132/68; 74; 20; 98.6; 98% ED Triage Vitals  Enc Vitals Group     BP --      Pulse --      Resp --      Temp --      Temp src --      SpO2 --      Weight --      Height --      Head Cir --      Peak Flow --      Pain Score --      Pain Loc --      Pain Edu? --      Excl. in [place]? --      Constitutional: Alert and oriented. Well appearing and in no acute distress. Eyes: Conjunctivae are normal. PERRL. EOMI. Nose: No  congestion/rhinnorhea. Mouth/Throat: Mucous membranes are moist.   Neck: No stridor. Cardiovascular: Good peripheral circulation. Respiratory: Normal respiratory effort. Musculoskeletal: FROM throughout. Neurologic:  Normal speech and language. No gross focal neurologic deficits are appreciated.   ____________________________________________   LABS (all labs ordered are listed, but only abnormal results are displayed)  Labs Reviewed - No data to display ____________________________________________  EKG   ____________________________________________  RADIOLOGY   ____________________________________________   PROCEDURES  Procedure(s) performed:   Yellow band removed from left ring finger via string wrap and lubricating jelly.  LACERATION REPAIR  Performed by: Sherrie George Consent: Verbal consent obtained. Risks and benefits: risks, benefits and alternatives were discussed Consent given by: patient Patient identity confirmed: provided demographic data Prepped and Draped in normal sterile fashion Wound explored  Laceration Location: Left medial ring at PIP  Laceration Length: 1.5 cm  No Foreign Bodies seen or palpated  Anesthesia: digital block  Local anesthetic: lidocaine 1% without epinephrine  Anesthetic total: 3.5 ml  Irrigation method: syringe Amount of cleaning: standard  Skin closure: 5-0 Nylon  Number of sutures: 4  Technique: Simple interrupted  Patient tolerance: Patient tolerated the procedure well with no immediate complications.  ____________________________________________   INITIAL IMPRESSION / ASSESSMENT AND PLAN / ED COURSE  Pertinent labs & imaging results that were available during my care of the patient were reviewed by me and considered in my medical decision making (see chart for details).  Patient will be advised to take tylenol or ibuprofen as needed.  She was advised to follow up with PCP in 10 days for suture removal.   She was also advised to return to the emergency department for symptoms that change or worsen if unable to schedule an appointment.  ____________________________________________   FINAL CLINICAL IMPRESSION(S) / ED DIAGNOSES  Final diagnoses:  Laceration of finger, ring, initial encounter  Ring or other jewelry causing external constriction, initial encounter       Victorino Dike, FNP 07/02/15 1234  Eula Listen, MD 07/02/15 1557

## 2015-07-02 NOTE — Discharge Instructions (Signed)
Laceration Care, Adult  A laceration is a cut that goes through all layers of the skin. The cut also goes into the tissue that is right under the skin. Some cuts heal on their own. Others need to be closed with stitches (sutures), staples, skin adhesive strips, or wound glue. Taking care of your cut lowers your risk of infection and helps your cut to heal better.  HOW TO TAKE CARE OF YOUR CUT  For stitches or staples:  · Keep the wound clean and dry.  · If you were given a bandage (dressing), you should change it at least one time per day or as told by your doctor. You should also change it if it gets wet or dirty.  · Keep the wound completely dry for the first 24 hours or as told by your doctor. After that time, you may take a shower or a bath. However, make sure that the wound is not soaked in water until after the stitches or staples have been removed.  · Clean the wound one time each day or as told by your doctor:    Wash the wound with soap and water.    Rinse the wound with water until all of the soap comes off.    Pat the wound dry with a clean towel. Do not rub the wound.  · After you clean the wound, put a thin layer of antibiotic ointment on it as told by your doctor. This ointment:    Helps to prevent infection.    Keeps the bandage from sticking to the wound.  · Have your stitches or staples removed as told by your doctor.  If your doctor used skin adhesive strips:   · Keep the wound clean and dry.  · If you were given a bandage, you should change it at least one time per day or as told by your doctor. You should also change it if it gets dirty or wet.  · Do not get the skin adhesive strips wet. You can take a shower or a bath, but be careful to keep the wound dry.  · If the wound gets wet, pat it dry with a clean towel. Do not rub the wound.  · Skin adhesive strips fall off on their own. You can trim the strips as the wound heals. Do not remove any strips that are still stuck to the wound. They will  fall off after a while.  If your doctor used wound glue:  · Try to keep your wound dry, but you may briefly wet it in the shower or bath. Do not soak the wound in water, such as by swimming.  · After you take a shower or a bath, gently pat the wound dry with a clean towel. Do not rub the wound.  · Do not do any activities that will make you really sweaty until the skin glue has fallen off on its own.  · Do not apply liquid, cream, or ointment medicine to your wound while the skin glue is still on.  · If you were given a bandage, you should change it at least one time per day or as told by your doctor. You should also change it if it gets dirty or wet.  · If a bandage is placed over the wound, do not let the tape for the bandage touch the skin glue.  · Do not pick at the glue. The skin glue usually stays on for 5-10 days. Then, it   falls off of the skin.  General Instructions   · To help prevent scarring, make sure to cover your wound with sunscreen whenever you are outside after stitches are removed, after adhesive strips are removed, or when wound glue stays in place and the wound is healed. Make sure to wear a sunscreen of at least 30 SPF.  · Take over-the-counter and prescription medicines only as told by your doctor.  · If you were given antibiotic medicine or ointment, take or apply it as told by your doctor. Do not stop using the antibiotic even if your wound is getting better.  · Do not scratch or pick at the wound.  · Keep all follow-up visits as told by your doctor. This is important.  · Check your wound every day for signs of infection. Watch for:    Redness, swelling, or pain.    Fluid, blood, or pus.  · Raise (elevate) the injured area above the level of your heart while you are sitting or lying down, if possible.  GET HELP IF:  · You got a tetanus shot and you have any of these problems at the injection site:    Swelling.    Very bad pain.    Redness.    Bleeding.  · You have a fever.  · A wound that was  closed breaks open.  · You notice a bad smell coming from your wound or your bandage.  · You notice something coming out of the wound, such as wood or glass.  · Medicine does not help your pain.  · You have more redness, swelling, or pain at the site of your wound.  · You have fluid, blood, or pus coming from your wound.  · You notice a change in the color of your skin near your wound.  · You need to change the bandage often because fluid, blood, or pus is coming from the wound.  · You start to have a new rash.  · You start to have numbness around the wound.  GET HELP RIGHT AWAY IF:  · You have very bad swelling around the wound.  · Your pain suddenly gets worse and is very bad.  · You notice painful lumps near the wound or on skin that is anywhere on your body.  · You have a red streak going away from your wound.  · The wound is on your hand or foot and you cannot move a finger or toe like you usually can.  · The wound is on your hand or foot and you notice that your fingers or toes look pale or bluish.     This information is not intended to replace advice given to you by your health care provider. Make sure you discuss any questions you have with your health care provider.     Document Released: 07/16/2007 Document Revised: 06/13/2014 Document Reviewed: 01/23/2014  Elsevier Interactive Patient Education ©2016 [employer]

## 2015-07-12 ENCOUNTER — Ambulatory Visit (INDEPENDENT_AMBULATORY_CARE_PROVIDER_SITE_OTHER): Payer: Managed Care, Other (non HMO) | Admitting: Family Medicine

## 2015-07-12 ENCOUNTER — Encounter: Payer: Self-pay | Admitting: Family Medicine

## 2015-07-12 VITALS — BP 134/77 | HR 75 | Temp 98.4°F | Resp 15 | Ht 62.0 in | Wt 180.8 lb

## 2015-07-12 DIAGNOSIS — Z4802 Encounter for removal of sutures: Secondary | ICD-10-CM | POA: Insufficient documentation

## 2015-07-12 DIAGNOSIS — I10 Essential (primary) hypertension: Secondary | ICD-10-CM | POA: Diagnosis not present

## 2015-07-12 MED ORDER — HYDROCHLOROTHIAZIDE 12.5 MG PO TABS: 12.5000 mg | ORAL_TABLET | Freq: Every day | ORAL | Status: DC

## 2015-07-12 MED ORDER — METOPROLOL SUCCINATE ER 50 MG PO TB24: 50.0000 mg | ORAL_TABLET | Freq: Every day | ORAL | Status: DC

## 2015-07-12 MED ORDER — AMLODIPINE BESYLATE 10 MG PO TABS: 10.0000 mg | ORAL_TABLET | Freq: Every day | ORAL | Status: DC

## 2015-07-12 NOTE — Progress Notes (Signed)
Name: Nichole Wells   MRN: [id number]    DOB: [birth year]   Date:07/12/2015       Progress Note  Subjective  Chief Complaint  Chief Complaint  Patient presents with  . Medication Refill    metoprolol 50 mg / amlodipine 10 mg / hydrochlorothiazide 12.5 mg  . Suture / Staple Removal    Suture / Staple Removal The sutures were placed 7 to 10 days ago. She tried regular peroxide and water cleansings and antibiotic ointment use since the wound repair. Her temperature was unmeasured prior to arrival. There has been no drainage from the wound. The redness has improved. The swelling has improved. The pain has improved. She has no difficulty moving the affected extremity or digit.  Hypertension This is a chronic problem. The problem is controlled. Associated symptoms include palpitations (does get intermittent palpitations.). Pertinent negatives include no chest pain, headaches or shortness of breath. Past treatments include beta blockers, calcium channel blockers and diuretics. There are no compliance problems.  There is no history of kidney disease, CAD/MI or CVA.     Past Medical History  Diagnosis Date  . Heart murmur   . Anxiety   . Hypertension     Past Surgical History  Procedure Laterality Date  . Breast surgery    . Dilation and curettage of uterus    . Cholecystectomy    . Uterine fibroid embolization    . Shoulder arthroscopy Right   . Tubal ligation      Family History  Problem Relation Age of Onset  . Cancer Mother     cervical, lung  . Heart disease Father   . Hypertension Father     Social History   Social History  . Marital Status: Married    Spouse Name: N/A  . Number of Children: N/A  . Years of Education: N/A   Occupational History  . Not on file.   Social History Main Topics  . Smoking status: Current Every Day Smoker -- 0.50 packs/day    Types: Cigarettes  . Smokeless tobacco: Never Used  . Alcohol Use: 0.0 oz/week    0 Standard drinks or  equivalent per week     Comment: Occasional  . Drug Use: No  . Sexual Activity: Yes     Comment: occasional   Other Topics Concern  . Not on file   Social History Narrative     Current outpatient prescriptions:  .  amLODipine (NORVASC) 10 MG tablet, Take 1 tablet (10 mg total) by mouth daily., Disp: 90 tablet, Rfl: 0 .  FLUoxetine (PROZAC) 40 MG capsule, TAKE 1 CAPSULE (40 MG TOTAL) BY MOUTH DAILY., Disp: 90 capsule, Rfl: 0 .  hydrochlorothiazide (HYDRODIURIL) 12.5 MG tablet, Take 1 tablet (12.5 mg total) by mouth daily., Disp: 90 tablet, Rfl: 0 .  meloxicam (MOBIC) 7.5 MG tablet, TAKE 1 TABLET (7.5 MG TOTAL) BY MOUTH ONCE DAILY., Disp: , Rfl: 1 .  metoprolol succinate (TOPROL-XL) 50 MG 24 hr tablet, Take 1 tablet (50 mg total) by mouth daily. Take with or immediately following a meal., Disp: 30 tablet, Rfl: 0 .  metoprolol succinate (TOPROL-XL) 50 MG 24 hr tablet, Take 1 tablet (50 mg total) by mouth daily. Take with or immediately following a meal., Disp: 90 tablet, Rfl: 0 .  pravastatin (PRAVACHOL) 40 MG tablet, TAKE 1 TABLET (40 MG TOTAL) BY MOUTH DAILY., Disp: 90 tablet, Rfl: 0 .  zolpidem (AMBIEN) 5 MG tablet, Take 1 tablet (5 mg total) by mouth  at bedtime as needed for sleep., Disp: 30 tablet, Rfl: 0  Allergies  Allergen Reactions  . Hydroxychloroquine Itching  . Penicillin G Hives  . Penicillins      Review of Systems  Respiratory: Negative for shortness of breath.   Cardiovascular: Positive for palpitations (does get intermittent palpitations.). Negative for chest pain.  Neurological: Negative for headaches.    Objective  Filed Vitals:   07/12/15 0844  BP: 134/77  Pulse: 75  Temp: 98.4 F (36.9 C)  TempSrc: Oral  Resp: 15  Height: 5\' 2"  (1.575 m)  Weight: 180 lb 12.8 oz (82.01 kg)  SpO2: 96%    Physical Exam  Constitutional: She is oriented to person, place, and time and well-developed, well-nourished, and in no distress.  HENT:  Head: Normocephalic and  atraumatic.  Cardiovascular: Normal rate and regular rhythm.   Pulmonary/Chest: Effort normal and breath sounds normal.  Musculoskeletal:       Hands: Repaired laceration on the left ring fingerpalmar aspect, mild surrounding erythema, no drainage from the incision.  Neurological: She is alert and oriented to person, place, and time.  Nursing note and vitals reviewed.    Assessment & Plan  1. Essential hypertension Blood pressure at goal, refills provided - amLODipine (NORVASC) 10 MG tablet; Take 1 tablet (10 mg total) by mouth daily.  Dispense: 90 tablet; Refill: 0 - hydrochlorothiazide (HYDRODIURIL) 12.5 MG tablet; Take 1 tablet (12.5 mg total) by mouth daily.  Dispense: 90 tablet; Refill: 0 - metoprolol succinate (TOPROL-XL) 50 MG 24 hr tablet; Take 1 tablet (50 mg total) by mouth daily. Take with or immediately following a meal.  Dispense: 90 tablet; Refill: 0  2. Encounter for removal of sutures Sutures were removed, patient tolerated the procedure without complications.   Nichole Wells Nichole Wells 07/12/2015 9:09 AM

## 2015-07-14 ENCOUNTER — Other Ambulatory Visit: Payer: Self-pay | Admitting: Family Medicine

## 2015-07-23 ENCOUNTER — Other Ambulatory Visit: Payer: Self-pay | Admitting: Family Medicine

## 2015-10-01 ENCOUNTER — Ambulatory Visit (INDEPENDENT_AMBULATORY_CARE_PROVIDER_SITE_OTHER): Payer: Managed Care, Other (non HMO) | Admitting: Family Medicine

## 2015-10-01 ENCOUNTER — Encounter: Payer: Self-pay | Admitting: Family Medicine

## 2015-10-01 VITALS — BP 126/84 | HR 73 | Temp 98.3°F | Resp 16 | Ht 62.0 in | Wt 187.5 lb

## 2015-10-01 DIAGNOSIS — F418 Other specified anxiety disorders: Secondary | ICD-10-CM | POA: Diagnosis not present

## 2015-10-01 DIAGNOSIS — I10 Essential (primary) hypertension: Secondary | ICD-10-CM | POA: Diagnosis not present

## 2015-10-01 DIAGNOSIS — E785 Hyperlipidemia, unspecified: Secondary | ICD-10-CM | POA: Diagnosis not present

## 2015-10-01 DIAGNOSIS — F329 Major depressive disorder, single episode, unspecified: Secondary | ICD-10-CM

## 2015-10-01 DIAGNOSIS — F32A Depression, unspecified: Secondary | ICD-10-CM

## 2015-10-01 DIAGNOSIS — F419 Anxiety disorder, unspecified: Secondary | ICD-10-CM

## 2015-10-01 MED ORDER — FLUOXETINE HCL 40 MG PO CAPS: 40.0000 mg | ORAL_CAPSULE | Freq: Every day | ORAL | 1 refills | Status: DC

## 2015-10-01 MED ORDER — PRAVASTATIN SODIUM 40 MG PO TABS: 40.0000 mg | ORAL_TABLET | Freq: Every day | ORAL | 1 refills | Status: DC

## 2015-10-01 MED ORDER — METOPROLOL SUCCINATE ER 50 MG PO TB24: 50.0000 mg | ORAL_TABLET | Freq: Every day | ORAL | 1 refills | Status: DC

## 2015-10-01 MED ORDER — HYDROCHLOROTHIAZIDE 12.5 MG PO TABS: 12.5000 mg | ORAL_TABLET | Freq: Every day | ORAL | 1 refills | Status: DC

## 2015-10-01 MED ORDER — AMLODIPINE BESYLATE 10 MG PO TABS: 10.0000 mg | ORAL_TABLET | Freq: Every day | ORAL | 0 refills | Status: DC

## 2015-10-01 NOTE — Progress Notes (Signed)
Name: Nichole Wells   MRN: [id number]    DOB: 1960/04/28   Date:10/01/2015       Progress Note  Subjective  Chief Complaint  Chief Complaint  Patient presents with  . Follow-up    medication refills    Hypertension  This is a chronic problem. The problem is controlled. Pertinent negatives include no blurred vision, chest pain, headaches, palpitations or shortness of breath. Past treatments include diuretics, calcium channel blockers and beta blockers. There is no history of kidney disease or CAD/MI.  Hyperlipidemia  This is a chronic problem. The problem is controlled. Pertinent negatives include no chest pain, leg pain, myalgias or shortness of breath. Current antihyperlipidemic treatment includes statins.  Depression         This is a chronic problem.The problem is unchanged.  Associated symptoms include no fatigue, no myalgias and no headaches.( No symptoms as long a she is on medication.)  Past treatments include SSRIs - Selective serotonin reuptake inhibitors.  Compliance with treatment is good.    Past Medical History:  Diagnosis Date  . Anxiety   . Heart murmur   . Hypertension     Past Surgical History:  Procedure Laterality Date  . BREAST SURGERY    . CHOLECYSTECTOMY    . DILATION AND CURETTAGE OF UTERUS    . SHOULDER ARTHROSCOPY Right   . TUBAL LIGATION    . UTERINE FIBROID EMBOLIZATION      Family History  Problem Relation Age of Onset  . Cancer Mother     cervical, lung  . Heart disease Father   . Hypertension Father     Social History   Social History  . Marital status: Married    Spouse name: N/A  . Number of children: N/A  . Years of education: N/A   Occupational History  . Not on file.   Social History Main Topics  . Smoking status: Current Every Day Smoker    Packs/day: 0.50    Types: Cigarettes  . Smokeless tobacco: Never Used  . Alcohol use 0.0 oz/week     Comment: Occasional  . Drug use: No  . Sexual activity: Yes     Comment:  occasional   Other Topics Concern  . Not on file   Social History Narrative  . No narrative on file     Current Outpatient Prescriptions:  .  amLODipine (NORVASC) 10 MG tablet, Take 1 tablet (10 mg total) by mouth daily., Disp: 90 tablet, Rfl: 0 .  FLUoxetine (PROZAC) 40 MG capsule, TAKE 1 CAPSULE (40 MG TOTAL) BY MOUTH DAILY., Disp: 90 capsule, Rfl: 0 .  hydrochlorothiazide (HYDRODIURIL) 12.5 MG tablet, Take 1 tablet (12.5 mg total) by mouth daily., Disp: 90 tablet, Rfl: 0 .  meloxicam (MOBIC) 7.5 MG tablet, TAKE 1 TABLET (7.5 MG TOTAL) BY MOUTH ONCE DAILY., Disp: , Rfl: 1 .  metoprolol succinate (TOPROL-XL) 50 MG 24 hr tablet, Take 1 tablet (50 mg total) by mouth daily. Take with or immediately following a meal., Disp: 90 tablet, Rfl: 0 .  pravastatin (PRAVACHOL) 40 MG tablet, TAKE 1 TABLET (40 MG TOTAL) BY MOUTH DAILY., Disp: 90 tablet, Rfl: 0 .  zolpidem (AMBIEN) 5 MG tablet, Take 1 tablet (5 mg total) by mouth at bedtime as needed for sleep., Disp: 30 tablet, Rfl: 0  Allergies  Allergen Reactions  . Hydroxychloroquine Itching  . Penicillin G Hives  . Penicillins      Review of Systems  Constitutional: Negative for fatigue.  Eyes:  Negative for blurred vision.  Respiratory: Negative for shortness of breath.   Cardiovascular: Negative for chest pain and palpitations.  Musculoskeletal: Negative for myalgias.  Neurological: Negative for headaches.  Psychiatric/Behavioral: Positive for depression.      Objective  Vitals:   10/01/15 1343  BP: 126/84  Pulse: 73  Resp: 16  Temp: 98.3 F (36.8 C)  TempSrc: Oral  SpO2: 96%  Weight: 187 lb 8 oz (85 kg)  Height: 5\' 2"  (1.575 m)    Physical Exam  Constitutional: She is oriented to person, place, and time and well-developed, well-nourished, and in no distress.  HENT:  Head: Normocephalic and atraumatic.  Cardiovascular: Normal rate, regular rhythm, S1 normal and S2 normal.   Murmur heard.  Systolic murmur is present  with a grade of 2/6  Pulmonary/Chest: Effort normal and breath sounds normal. No accessory muscle usage. No respiratory distress. She has no wheezes. She has no rhonchi.  Neurological: She is alert and oriented to person, place, and time.  Psychiatric: Mood, memory, affect and judgment normal. Her mood appears not anxious. She does not exhibit a depressed mood.  Nursing note and vitals reviewed.     Assessment & Plan  1. Essential hypertension  - amLODipine (NORVASC) 10 MG tablet; Take 1 tablet (10 mg total) by mouth daily.  Dispense: 90 tablet; Refill: 0 - hydrochlorothiazide (HYDRODIURIL) 12.5 MG tablet; Take 1 tablet (12.5 mg total) by mouth daily.  Dispense: 90 tablet; Refill: 1 - metoprolol succinate (TOPROL-XL) 50 MG 24 hr tablet; Take 1 tablet (50 mg total) by mouth daily. Take with or immediately following a meal.  Dispense: 90 tablet; Refill: 1  2. HLD (hyperlipidemia)  - pravastatin (PRAVACHOL) 40 MG tablet; Take 1 tablet (40 mg total) by mouth daily.  Dispense: 90 tablet; Refill: 1  3. Anxiety and depression Symptoms in remission while on SSRI therapy - FLUoxetine (PROZAC) 40 MG capsule; Take 1 capsule (40 mg total) by mouth daily.  Dispense: 90 capsule; Refill: 1   Chrissa Meetze [facility] 10/01/2015 1:50 PM

## 2015-11-01 ENCOUNTER — Encounter: Payer: Self-pay | Admitting: Family Medicine

## 2015-11-01 ENCOUNTER — Ambulatory Visit (INDEPENDENT_AMBULATORY_CARE_PROVIDER_SITE_OTHER): Payer: Managed Care, Other (non HMO) | Admitting: Family Medicine

## 2015-11-01 VITALS — BP 120/80 | HR 70 | Temp 98.4°F | Resp 16 | Ht 62.0 in | Wt 187.0 lb

## 2015-11-01 DIAGNOSIS — Z1211 Encounter for screening for malignant neoplasm of colon: Secondary | ICD-10-CM | POA: Diagnosis not present

## 2015-11-01 DIAGNOSIS — L814 Other melanin hyperpigmentation: Secondary | ICD-10-CM

## 2015-11-01 DIAGNOSIS — Z0000 Encounter for general adult medical examination without abnormal findings: Secondary | ICD-10-CM | POA: Diagnosis not present

## 2015-11-01 DIAGNOSIS — L819 Disorder of pigmentation, unspecified: Secondary | ICD-10-CM

## 2015-11-01 DIAGNOSIS — R739 Hyperglycemia, unspecified: Secondary | ICD-10-CM | POA: Diagnosis not present

## 2015-11-01 LAB — COMPLETE METABOLIC PANEL WITH GFR
ALBUMIN: 4.1 g/dL (ref 3.6–5.1)
ALK PHOS: 73 U/L (ref 33–130)
ALT: 17 U/L (ref 6–29)
AST: 18 U/L (ref 10–35)
BILIRUBIN TOTAL: 0.3 mg/dL (ref 0.2–1.2)
BUN: 13 mg/dL (ref 7–25)
CO2: 26 mmol/L (ref 20–31)
CREATININE: 0.6 mg/dL (ref 0.50–1.05)
Calcium: 9.3 mg/dL (ref 8.6–10.4)
Chloride: 100 mmol/L (ref 98–110)
GFR, Est African American: >89 mL/min (ref >=60)
GFR, Est Non African American: >89 mL/min (ref >=60)
GLUCOSE: 92 mg/dL (ref 65–99)
Potassium: 4.4 mmol/L (ref 3.5–5.3)
SODIUM: 137 mmol/L (ref 135–146)
TOTAL PROTEIN: 6.4 g/dL (ref 6.1–8.1)

## 2015-11-01 LAB — TSH: TSH: 1.73 mIU/L

## 2015-11-01 LAB — POCT GLYCOSYLATED HEMOGLOBIN (HGB A1C): HEMOGLOBIN A1C: 5.8

## 2015-11-01 NOTE — Progress Notes (Signed)
Name: Nichole Wells   MRN: [id number]    DOB: 10/10/1960   Date:11/01/2015       Progress Note  Subjective  Chief Complaint  Chief Complaint  Patient presents with  . Annual Exam    CPE    HPI  Pt. Presents for a Complete Physical Exam. She is scheduled with GYN for mammogram and Pap smear screenings.  She never had a colonoscopy and had blood work done through [facility] for wellness screening and according to the patient, her cholesterol came back fine.     Past Medical History:  Diagnosis Date  . Anxiety   . Heart murmur   . Hypertension     Past Surgical History:  Procedure Laterality Date  . BREAST SURGERY    . CHOLECYSTECTOMY    . DILATION AND CURETTAGE OF UTERUS    . SHOULDER ARTHROSCOPY Right   . TUBAL LIGATION    . UTERINE FIBROID EMBOLIZATION      Family History  Problem Relation Age of Onset  . Cancer Mother     cervical, lung  . Heart disease Father   . Hypertension Father     Social History   Social History  . Marital status: Married    Spouse name: N/A  . Number of children: N/A  . Years of education: N/A   Occupational History  . Not on file.   Social History Main Topics  . Smoking status: Current Every Day Smoker    Packs/day: 0.50    Types: Cigarettes  . Smokeless tobacco: Never Used  . Alcohol use 0.0 oz/week     Comment: Occasional  . Drug use: No  . Sexual activity: Yes     Comment: occasional   Other Topics Concern  . Not on file   Social History Narrative  . No narrative on file     Current Outpatient Prescriptions:  .  amLODipine (NORVASC) 10 MG tablet, Take 1 tablet (10 mg total) by mouth daily., Disp: 90 tablet, Rfl: 0 .  FLUoxetine (PROZAC) 40 MG capsule, Take 1 capsule (40 mg total) by mouth daily., Disp: 90 capsule, Rfl: 1 .  hydrochlorothiazide (HYDRODIURIL) 12.5 MG tablet, Take 1 tablet (12.5 mg total) by mouth daily., Disp: 90 tablet, Rfl: 1 .  metoprolol succinate (TOPROL-XL) 50 MG 24 hr tablet, Take 1 tablet (50  mg total) by mouth daily. Take with or immediately following a meal., Disp: 90 tablet, Rfl: 1 .  pravastatin (PRAVACHOL) 40 MG tablet, Take 1 tablet (40 mg total) by mouth daily., Disp: 90 tablet, Rfl: 1 .  meloxicam (MOBIC) 7.5 MG tablet, TAKE 1 TABLET (7.5 MG TOTAL) BY MOUTH ONCE DAILY., Disp: , Rfl: 1 .  zolpidem (AMBIEN) 5 MG tablet, Take 1 tablet (5 mg total) by mouth at bedtime as needed for sleep. (Patient not taking: Reported on 11/01/2015), Disp: 30 tablet, Rfl: 0  Allergies  Allergen Reactions  . Hydroxychloroquine Itching  . Penicillin G Hives  . Penicillins      Review of Systems  Constitutional: Negative for chills, fever and malaise/fatigue.  Eyes: Negative for blurred vision.  Respiratory: Negative for cough and shortness of breath.   Cardiovascular: Negative for chest pain and palpitations.  Gastrointestinal: Positive for heartburn (occasional heartburn). Negative for blood in stool, melena, nausea and vomiting.  Genitourinary: Negative for dysuria, frequency and hematuria.  Musculoskeletal: Positive for back pain and joint pain. Negative for myalgias and neck pain.  Skin: Positive for rash.  Neurological: Negative for dizziness and  headaches.  Psychiatric/Behavioral: Negative for depression. The patient is not nervous/anxious and does not have insomnia.     Objective  Vitals:   11/01/15 1057  BP: 120/80  Pulse: 70  Resp: 16  Temp: 98.4 F (36.9 C)  TempSrc: Oral  SpO2: 96%  Weight: 187 lb (84.8 kg)  Height: 5\' 2"  (1.575 m)    Physical Exam  Constitutional: She is oriented to person, place, and time and well-developed, well-nourished, and in no distress.  HENT:  Head: Normocephalic.  Cardiovascular: Normal rate, regular rhythm, S1 normal and S2 normal.   Murmur heard.  Systolic murmur is present with a grade of 2/6  Pulmonary/Chest: Breath sounds normal. She has no wheezes. She has no rhonchi.  Abdominal: Soft. Bowel sounds are normal.   Musculoskeletal:       Right ankle: She exhibits no swelling.       Left ankle: She exhibits no swelling.  Neurological: She is alert and oriented to person, place, and time.  Skin: Skin is warm and dry. Lesion noted.  Scattered hyperpigmented macular well defined lesion on the left and right thigh,  Psychiatric: Mood, memory, affect and judgment normal.  Nursing note and vitals reviewed.   Assessment & Plan  1. Well woman exam without gynecological exam Patient will schedule GYN exam and screenings with gynecologist - COMPLETE METABOLIC PANEL WITH GFR - Vitamin D (25 hydroxy) - TSH  2. Screening for colon cancer  - Cologuard  3. Hyperglycemia Point-of-care A1c is 5.8%, consistent with prediabetes - POCT HgB A1C  4. Age spots Likely benign, no change in shape, color, or character. Reassured  [name] Asad A. [facility] 11/01/2015 11:22 AM

## 2015-11-02 LAB — VITAMIN D 25 HYDROXY (VIT D DEFICIENCY, FRACTURES): Vit D, 25-Hydroxy: 24 ng/mL — ABNORMAL LOW (ref 30–100)

## 2015-11-09 ENCOUNTER — Telehealth: Payer: Self-pay

## 2015-11-09 MED ORDER — VITAMIN D (ERGOCALCIFEROL) 1.25 MG (50000 UNIT) PO CAPS: 50000.0000 [IU] | ORAL_CAPSULE | ORAL | 0 refills | Status: DC

## 2015-11-09 NOTE — Telephone Encounter (Signed)
Patient has been notified of lab results and a prescription for vitamin D 50,000 units take 1 capsule once a week for 12 weeks has been sent to [facility] [address] Per Dr. Manuella Ghazi, patient has been notified and verbalized understanding

## 2015-11-12 ENCOUNTER — Telehealth: Payer: Self-pay | Admitting: Emergency Medicine

## 2015-11-12 MED ORDER — VITAMIN D3 1.25 MG (50000 UT) PO CAPS: 1.0000 | ORAL_CAPSULE | ORAL | 0 refills | Status: AC

## 2015-11-12 NOTE — Telephone Encounter (Signed)
Prescription for vitamin D was sent as vitamin D2 by Nichole Wells. I will send a prescription for vitamin D3, cholecalciferol 50,000 units 1 capsule weekly 12 weeks. Recheck levels in 3 months. If patient wants to take over-the-counter vitamin D, she may take 2000 IUs daily after completion of 3 months of vitamin D3 50,000 units.

## 2015-11-12 NOTE — Telephone Encounter (Signed)
Script was sent to pharmacy for Vitamin D 50,000, the insurance will not cover. How much do she need to take if she get over the counter

## 2015-11-13 ENCOUNTER — Ambulatory Visit (INDEPENDENT_AMBULATORY_CARE_PROVIDER_SITE_OTHER): Payer: Managed Care, Other (non HMO) | Admitting: Obstetrics and Gynecology

## 2015-11-13 ENCOUNTER — Encounter: Payer: Self-pay | Admitting: Obstetrics and Gynecology

## 2015-11-13 VITALS — BP 120/77 | HR 73 | Ht 62.0 in | Wt 188.5 lb

## 2015-11-13 DIAGNOSIS — Z842 Family history of other diseases of the genitourinary system: Secondary | ICD-10-CM | POA: Diagnosis not present

## 2015-11-13 DIAGNOSIS — R140 Abdominal distension (gaseous): Secondary | ICD-10-CM | POA: Diagnosis not present

## 2015-11-13 DIAGNOSIS — N946 Dysmenorrhea, unspecified: Secondary | ICD-10-CM | POA: Diagnosis not present

## 2015-11-13 DIAGNOSIS — D259 Leiomyoma of uterus, unspecified: Secondary | ICD-10-CM

## 2015-11-13 DIAGNOSIS — Z8041 Family history of malignant neoplasm of ovary: Secondary | ICD-10-CM | POA: Insufficient documentation

## 2015-11-13 DIAGNOSIS — R102 Pelvic and perineal pain: Secondary | ICD-10-CM

## 2015-11-13 DIAGNOSIS — Z9889 Other specified postprocedural states: Secondary | ICD-10-CM

## 2015-11-13 NOTE — Patient Instructions (Signed)
1. Ultrasound is scheduled to assess uterine fibroids and pelvic pain 2. Return in 2 weeks for annual exam and further management planning

## 2015-11-13 NOTE — Progress Notes (Signed)
GYN ENCOUNTER NOTE  Subjective:       Nichole Wells is a 55 y.o. G39P2002 female is here for gynecologic evaluation of the following issues:  1. Establish care 2. Abdominal pelvic pain  55 year old white female para 2002, having regular cycles, status post BTL, presents for evaluation of right-sided pelvic pain which began several weeks ago. Pain is described as intermittent, lasting seconds, without any exacerbating or alleviating factors. She denies UTI symptoms in the form of urinary frequency, urgency, or dysuria. She denies deep thrusting dyspareunia. Bowel function is normal. She does report some bloating. No recent weight gain or weight loss. Occasional vasomotor symptoms mild   Gynecologic History Patient's last menstrual period was 10/30/2015. Menarche: Age 12 Intervals: Regular on a monthly basis Duration: 3 days Dysmenorrhea: Mild currently; past history of moderate dysmenorrhea which did cause her to miss school and work in the past on an infrequent basis. History of uterine fibroids. Status post uterine artery embolization in 2010 because of menorrhagia; post procedure bleeding normalized History of previous D&C 2 because of menorrhagia without hyperplasia or carcinoma findings Family history of ovarian cancer in maternal aunt; no genetic testing was performed. Family history of breast cancer in a different maternal aunt Family history of endometriosis in daughter who recently underwent hysterectomy  Obstetric History OB History  Gravida Para Term Preterm AB Living  2 2 2     2   SAB TAB Ectopic Multiple Live Births          2    # Outcome Date GA Lbr Len/2nd Weight Sex Delivery Anes PTL Lv  2 Term 1988   7 lb 9.6 oz (3.447 kg) F Vag-Spont   LIV  1 Term 1986   7 lb 9.6 oz (3.447 kg) M Vag-Spont   LIV      Past Medical History:  Diagnosis Date  . Anxiety   . Arthritis   . Heart murmur   . Hypertension   . Vitamin D deficiency     Past Surgical History:   Procedure Laterality Date  . BREAST SURGERY     benign biopsy  . CHOLECYSTECTOMY    . DILATION AND CURETTAGE OF UTERUS    . SHOULDER ARTHROSCOPY Right   . TUBAL LIGATION    . UTERINE FIBROID EMBOLIZATION      Current Outpatient Prescriptions on File Prior to Visit  Medication Sig Dispense Refill  . amLODipine (NORVASC) 10 MG tablet Take 1 tablet (10 mg total) by mouth daily. 90 tablet 0  . Cholecalciferol (VITAMIN D3) 50000 units CAPS Take 1 capsule by mouth once a week. 12 capsule 0  . FLUoxetine (PROZAC) 40 MG capsule Take 1 capsule (40 mg total) by mouth daily. 90 capsule 1  . hydrochlorothiazide (HYDRODIURIL) 12.5 MG tablet Take 1 tablet (12.5 mg total) by mouth daily. 90 tablet 1  . meloxicam (MOBIC) 7.5 MG tablet TAKE 1 TABLET (7.5 MG TOTAL) BY MOUTH ONCE DAILY.  1  . metoprolol succinate (TOPROL-XL) 50 MG 24 hr tablet Take 1 tablet (50 mg total) by mouth daily. Take with or immediately following a meal. 90 tablet 1  . pravastatin (PRAVACHOL) 40 MG tablet Take 1 tablet (40 mg total) by mouth daily. 90 tablet 1   No current facility-administered medications on file prior to visit.     Allergies  Allergen Reactions  . Hydroxychloroquine Itching  . Penicillin G Hives  . Penicillins     Social History   Social History  . Marital status:  Married    Spouse name: N/A  . Number of children: N/A  . Years of education: N/A   Occupational History  . Not on file.   Social History Main Topics  . Smoking status: Current Every Day Smoker    Packs/day: 0.50    Types: Cigarettes  . Smokeless tobacco: Never Used  . Alcohol use 0.0 oz/week     Comment: Occasional  . Drug use: No  . Sexual activity: Yes    Birth control/ protection: Surgical     Comment: occasional   Other Topics Concern  . Not on file   Social History Narrative  . No narrative on file    Family History  Problem Relation Age of Onset  . Cancer Mother     cervical, lung  . Heart disease Father   .  Hypertension Father   . Breast cancer Maternal Aunt   . Ovarian cancer Maternal Aunt   . Diabetes Neg Hx     The following portions of the patient's history were reviewed and updated as appropriate: allergies, current medications, past family history, past medical history, past social history, past surgical history and problem list.  Review of Systems Review of Systems -Per history of present illness  Objective:   BP 120/77   Pulse 73   Ht 5\' 2"  (1.575 m)   Wt 188 lb 8 oz (85.5 kg)   LMP 10/30/2015   BMI 34.48 kg/m  CONSTITUTIONAL: Well-developed, well-nourished female in no acute distress.  HENT:  Normocephalic, atraumatic.  NECK: Normal range of motion, supple, no masses.  Normal thyroid.  SKIN: Skin is warm and dry. No rash noted. Not diaphoretic. No erythema. No pallor. [place]: Alert and oriented to person, place, and time. PSYCHIATRIC: Normal mood and affect. Normal behavior. Normal judgment and thought content. CARDIOVASCULAR:Not Examined RESPIRATORY: Not Examined BREASTS: Not Examined ABDOMEN: Soft, non distended; Non tender.  No Organomegaly. PELVIC:  External Genitalia: Normal  BUS: Normal  Vagina: Normal  Cervix: Parous; no lesions; mild cervical motion tenderness  Uterus: Enlarged 12 week size, irregular shape, normal consistency, mobile, slightly tender with left-sided prominence  Adnexa: Normal; nonpalpable nontender  RV: Normal external exam  Bladder: Nontender MUSCULOSKELETAL: Normal range of motion. No tenderness.  No cyanosis, clubbing, or edema.     Assessment:   1. Uterine leiomyoma, unspecified location - US Pelvis Complete; Future - US Transvaginal Non-OB; Future  2. Pelvic pain - US Pelvis Complete; Future - US Transvaginal Non-OB; Future  3. Dysmenorrhea  4. Status post embolization of uterine artery  5. Family history of endometriosis, daughter  74. Family history of ovarian cancer, maternal aunt  78. Family history of breast  cancer, maternal aunt  27. Obesity     Plan:   1. Pelvic ultrasound 2. Return in 2 weeks for annual exam and further management planning  Brayton Mars, MD  Note: This dictation was prepared with Dragon dictation along with smaller phrase technology. Any transcriptional errors that result from this process are unintentional.

## 2015-11-20 ENCOUNTER — Ambulatory Visit (INDEPENDENT_AMBULATORY_CARE_PROVIDER_SITE_OTHER): Payer: Managed Care, Other (non HMO)

## 2015-11-20 DIAGNOSIS — R102 Pelvic and perineal pain: Secondary | ICD-10-CM | POA: Diagnosis not present

## 2015-11-20 DIAGNOSIS — D259 Leiomyoma of uterus, unspecified: Secondary | ICD-10-CM | POA: Diagnosis not present

## 2015-11-29 ENCOUNTER — Ambulatory Visit (INDEPENDENT_AMBULATORY_CARE_PROVIDER_SITE_OTHER): Payer: Managed Care, Other (non HMO) | Admitting: Obstetrics and Gynecology

## 2015-11-29 VITALS — BP 130/73 | HR 69 | Ht 62.0 in | Wt 186.3 lb

## 2015-11-29 DIAGNOSIS — Z9889 Other specified postprocedural states: Secondary | ICD-10-CM | POA: Diagnosis not present

## 2015-11-29 DIAGNOSIS — N946 Dysmenorrhea, unspecified: Secondary | ICD-10-CM

## 2015-11-29 DIAGNOSIS — Z8041 Family history of malignant neoplasm of ovary: Secondary | ICD-10-CM | POA: Diagnosis not present

## 2015-11-29 DIAGNOSIS — Z1231 Encounter for screening mammogram for malignant neoplasm of breast: Secondary | ICD-10-CM | POA: Diagnosis not present

## 2015-11-29 DIAGNOSIS — Z842 Family history of other diseases of the genitourinary system: Secondary | ICD-10-CM | POA: Diagnosis not present

## 2015-11-29 DIAGNOSIS — Z1239 Encounter for other screening for malignant neoplasm of breast: Secondary | ICD-10-CM

## 2015-11-29 DIAGNOSIS — R102 Pelvic and perineal pain: Secondary | ICD-10-CM | POA: Diagnosis not present

## 2015-11-29 DIAGNOSIS — R140 Abdominal distension (gaseous): Secondary | ICD-10-CM

## 2015-11-29 DIAGNOSIS — Z01419 Encounter for gynecological examination (general) (routine) without abnormal findings: Secondary | ICD-10-CM | POA: Diagnosis not present

## 2015-11-29 DIAGNOSIS — D259 Leiomyoma of uterus, unspecified: Secondary | ICD-10-CM

## 2015-11-29 NOTE — Patient Instructions (Addendum)
1. Pap smear is done 2. Mammogram is ordered 3. Smoking cessation strongly encouraged 4. Continue with healthy eating and exercise 5. Contraception-tubal ligation 6. Schedule laparoscopy with peritoneal biopsies to rule out endometriosis 7. Return for preoperative appointment the week before surgery 8. Return in 1 year for annual exam  Diagnostic Laparoscopy A diagnostic laparoscopy is a procedure to diagnose diseases in the abdomen. During the procedure, a thin, lighted, pencil-sized instrument called a laparoscope is inserted into the abdomen through an incision. The laparoscope allows your health care provider to look at the organs inside your body. LET [facility] CARE PROVIDER KNOW ABOUT:  Any allergies you have.  All medicines you are taking, including vitamins, herbs, eye drops, creams, and over-the-counter medicines.  Previous problems you or members of your family have had with the use of anesthetics.  Any blood disorders you have.  Previous surgeries you have had.  Medical conditions you have. RISKS AND COMPLICATIONS  Generally, this is a safe procedure. However, problems can occur, which may include:  Infection.  Bleeding.  Damage to other organs.  Allergic reaction to the anesthetics used during the procedure. BEFORE THE PROCEDURE  Do not eat or drink anything after midnight on the night before the procedure or as directed by your health care provider.  Ask your health care provider about:  Changing or stopping your regular medicines.  Taking medicines such as aspirin and ibuprofen. These medicines can thin your blood. Do not take these medicines before your procedure if your health care provider instructs you not to.  Plan to have someone take you home after the procedure. PROCEDURE  You may be given a medicine to help you relax (sedative).  You will be given a medicine to make you sleep (general anesthetic).  Your abdomen will be inflated with a gas.  This will make your organs easier to see.  Small incisions will be made in your abdomen.  A laparoscope and other small instruments will be inserted into the abdomen through the incisions.  A tissue sample may be removed from an organ in the abdomen for examination.  The instruments will be removed from the abdomen.  The gas will be released.  The incisions will be closed with stitches (sutures). AFTER THE PROCEDURE  Your blood pressure, heart rate, breathing rate, and blood oxygen level will be monitored often until the medicines you were given have worn off.   This information is not intended to replace advice given to you by your health care provider. Make sure you discuss any questions you have with your health care provider.   Document Released: 05/05/2000 Document Revised: 10/18/2014 Document Reviewed: 09/09/2013 Elsevier Interactive Patient Education [employer].    Endometriosis Endometriosis is a condition in which the tissue that lines the uterus (endometrium) grows outside of its normal location. The tissue may grow in many locations close to the uterus, but it commonly grows on the ovaries, fallopian tubes, vagina, or bowel. Because the uterus expels, or sheds, its lining every menstrual cycle, there is bleeding wherever the endometrial tissue is located. This can cause pain because blood is irritating to tissues not normally exposed to it.  CAUSES  The cause of endometriosis is not known.  SIGNS AND SYMPTOMS  Often, there are no symptoms. When symptoms are present, they can vary with the location of the displaced tissue. Various symptoms can occur at different times. Although symptoms occur mainly during a woman's menstrual period, they can also occur midcycle and  usually stop with menopause. Some people may go months with no symptoms at all. Symptoms may include:   Back or abdominal pain.   Heavier bleeding during periods.   Pain during intercourse.    Painful bowel movements.   Infertility. DIAGNOSIS  Your health care provider will do a physical exam and ask about your symptoms. Various tests may be done, such as:   Blood tests and urine tests. These are done to help rule out other problems.   Ultrasound. This test is done to look for abnormal tissue.   An X-ray of the lower bowel (barium enema).  Laparoscopy. In this procedure, a thin, lighted tube with a tiny camera on the end (laparoscope) is inserted into your abdomen. This helps your health care provider look for abnormal tissue to confirm the diagnosis. The health care provider may also remove a small piece of tissue (biopsy) from any abnormal tissue found. This tissue sample can then be sent to a lab so it can be looked at under a microscope. TREATMENT  Treatment will vary and may include:   Medicines to relieve pain. Nonsteroidal anti-inflammatory drugs (NSAIDs) are a type of pain medicine that can help to relieve the pain caused by endometriosis.  Hormonal therapy. When using hormonal therapy, periods are eliminated. This eliminates the monthly exposure to blood by the displaced endometrial tissue.   Surgery. Surgery may sometimes be done to remove the abnormal endometrial tissue. In severe cases, surgery may be done to remove the fallopian tubes, uterus, and ovaries (hysterectomy). HOME CARE INSTRUCTIONS   Take all medicines as directed by your health care provider. Do not take aspirin because it may increase bleeding when you are not on hormonal therapy.   Avoid activities that produce pain, including sexual activity. SEEK MEDICAL CARE IF:  You have pelvic pain before, after, or during your periods.  You have pelvic pain between periods that gets worse during your period.  You have pelvic pain during or after sex.  You have pelvic pain with bowel movements or urination, especially during your period.  You have problems getting pregnant.  You have a  fever. SEEK IMMEDIATE MEDICAL CARE IF:   Your pain is severe and is not responding to pain medicine.   You have severe nausea and vomiting, or you cannot keep foods down.   You have pain that is limited to the right lower part of your abdomen.   You have swelling or increasing pain in your abdomen.   You see blood in your stool.  MAKE SURE YOU:   Understand these instructions.  Will watch your condition.  Will get help right away if you are not doing well or get worse.   This information is not intended to replace advice given to you by your health care provider. Make sure you discuss any questions you have with your health care provider.   Document Released: 01/25/2000 Document Revised: 02/17/2014 Document Reviewed: 09/24/2012 Elsevier Interactive Patient Education [employer].

## 2015-11-29 NOTE — Progress Notes (Signed)
[place] IS year you have engaged urine down neck vessels  The Now she is now she interruptedANNUAL PREVENTATIVE CARE GYN  ENCOUNTER NOTE  Subjective:       Nichole Wells is a 55 y.o. G10P2002 female here for a routine annual gynecologic exam.  Current complaints: 1.  U/s results  2.  Left side pelvic pain  3.  Bloating 4. History of dysmenorrhea 5. Family history of endometriosis  11/20/2015 ultrasound    Findings:  The uterus measures 7.9 x 4.1x 4.6cm. Echo texture is heterogenous with evidence of focal masses. Within the uterus are multiple suspected fibroids measuring: Fibroid 1: Right Fundal with calcifications measures 1.4 x 1.5 x 1.5 cm. Fibroid 2: Left Fundal with calcifications measures 2 x 2.2 x 1.5 cm.  The Endometrium measures 4.8 mm.  Right Ovary measures 2.5 x 1.8 x 2.2 cm. It is normal in appearance. Left Ovary measures 4.7 x 3.7 x 4 cm. Simple cyst on left ovary measures 3.1 x 2.9 x 2.9 cm. Survey of the adnexa demonstrates no adnexal masses. There is no free fluid in the cul de sac.  Impression: 1. Fibroid Uterus 2. Left ovarian cyst 3.1 cm    Gynecologic History Patient's last menstrual period was 10/30/2015. Contraception: tubal ligation Last Pap: 2015 . Results were: normal Last mammogram: 2012. Results were: normal  Obstetric History OB History  Gravida Para Term Preterm AB Living  2 2 2     2   SAB TAB Ectopic Multiple Live Births          2    # Outcome Date GA Lbr Len/2nd Weight Sex Delivery Anes PTL Lv  2 Term 1988   7 lb 9.6 oz (3.447 kg) F Vag-Spont   LIV  1 Term 1986   7 lb 9.6 oz (3.447 kg) M Vag-Spont   LIV      Past Medical History:  Diagnosis Date  . Anxiety   . Arthritis   . Heart murmur   . Hypertension   . Vitamin D deficiency     Past Surgical History:  Procedure Laterality Date  . BREAST SURGERY     benign biopsy  . CHOLECYSTECTOMY    . DILATION AND CURETTAGE OF UTERUS    . SHOULDER ARTHROSCOPY Right   . TUBAL  LIGATION    . UTERINE FIBROID EMBOLIZATION      Current Outpatient Prescriptions on File Prior to Visit  Medication Sig Dispense Refill  . amLODipine (NORVASC) 10 MG tablet Take 1 tablet (10 mg total) by mouth daily. 90 tablet 0  . Cholecalciferol (VITAMIN D3) 50000 units CAPS Take 1 capsule by mouth once a week. 12 capsule 0  . etodolac (LODINE) 400 MG tablet Take 400 mg by mouth 2 (two) times daily.    Marland Kitchen FLUoxetine (PROZAC) 40 MG capsule Take 1 capsule (40 mg total) by mouth daily. 90 capsule 1  . hydrochlorothiazide (HYDRODIURIL) 12.5 MG tablet Take 1 tablet (12.5 mg total) by mouth daily. 90 tablet 1  . meloxicam (MOBIC) 7.5 MG tablet TAKE 1 TABLET (7.5 MG TOTAL) BY MOUTH ONCE DAILY.  1  . metoprolol succinate (TOPROL-XL) 50 MG 24 hr tablet Take 1 tablet (50 mg total) by mouth daily. Take with or immediately following a meal. 90 tablet 1  . pravastatin (PRAVACHOL) 40 MG tablet Take 1 tablet (40 mg total) by mouth daily. 90 tablet 1   No current facility-administered medications on file prior to visit.     Allergies  Allergen  Reactions  . Hydroxychloroquine Itching  . Penicillin G Hives  . Penicillins     Social History   Social History  . Marital status: Married    Spouse name: N/A  . Number of children: N/A  . Years of education: N/A   Occupational History  . Not on file.   Social History Main Topics  . Smoking status: Current Every Day Smoker    Packs/day: 0.50    Types: Cigarettes  . Smokeless tobacco: Never Used  . Alcohol use 0.0 oz/week     Comment: Occasional  . Drug use: No  . Sexual activity: Yes    Birth control/ protection: Surgical     Comment: occasional   Other Topics Concern  . Not on file   Social History Narrative  . No narrative on file    Family History  Problem Relation Age of Onset  . Cancer Mother     cervical, lung  . Heart disease Father   . Hypertension Father   . Breast cancer Maternal Aunt   . Ovarian cancer Maternal Aunt    . Diabetes Neg Hx     The following portions of the patient's history were reviewed and updated as appropriate: allergies, current medications, past family history, past medical history, past social history, past surgical history and problem list.  Review of Systems ROS Review of Systems - General ROS: negative for - chills, fatigue, fever, hot flashes, night sweats, weight gain or weight loss Psychological ROS: negative for - anxiety, decreased libido, depression, mood swings, physical abuse or sexual abuse Ophthalmic ROS: negative for - blurry vision, eye pain or loss of vision ENT ROS: negative for - headaches, hearing change, visual changes or vocal changes Allergy and Immunology ROS: negative for - hives, itchy/watery eyes or seasonal allergies Hematological and Lymphatic ROS: negative for - bleeding problems, bruising, swollen lymph nodes or weight loss Endocrine ROS: negative for - galactorrhea, hair pattern changes, hot flashes, malaise/lethargy, mood swings, palpitations, polydipsia/polyuria, skin changes, temperature intolerance or unexpected weight changes Breast ROS: negative for - new or changing breast lumps or nipple discharge Respiratory ROS: negative for - cough or shortness of breath Cardiovascular ROS: negative for - chest pain, irregular heartbeat, palpitations or shortness of breath Gastrointestinal ROS: no abdominal pain, change in bowel habits, or black or bloody stools Genito-Urinary ROS: no dysuria, trouble voiding, or hematuria Musculoskeletal ROS: negative for - joint pain or joint stiffness Neurological ROS: negative for - bowel and bladder control changes Dermatological ROS: negative for rash and skin lesion changes   Objective:   BP 130/73   Pulse 69   Ht 5\' 2"  (1.575 m)   Wt 186 lb 4.8 oz (84.5 kg)   LMP 10/30/2015   BMI 34.07 kg/m  CONSTITUTIONAL: Well-developed, well-nourished female in no acute distress.  PSYCHIATRIC: Normal mood and affect. Normal  behavior. Normal judgment and thought content. [place]: Alert and oriented to person, place, and time. Normal muscle tone coordination. No cranial nerve deficit noted. HENT:  Normocephalic, atraumatic EYES: Conjunctivae and EOM are normal.  No scleral icterus.  NECK: Normal range of motion, supple, no masses.  Normal thyroid.  SKIN: Skin is warm and dry. No rash noted. Not diaphoretic. No erythema. No pallor. CARDIOVASCULAR: Normal heart rate noted, regular rhythm, no murmur. RESPIRATORY: Clear to auscultation bilaterally. Effort and breath sounds normal, no problems with respiration noted. BREASTS: Symmetric in size. No masses, skin changes, nipple drainage, or lymphadenopathy. ABDOMEN: Soft, normal bowel sounds, no distention noted.  No tenderness, rebound or guarding.  BLADDER: Normal PELVIC:  External Genitalia: Normal  BUS: Normal  Vagina: Normal  Cervix: Normal; No lesions; greenish brown discharge at os were: Set high within the vaginal vault; 1/4 cervical motion tenderness  Uterus: 10-12 weeks size, midplane to anterior; 2/4 tender  Adnexa: Normal; nonpalpable; tenderness 2/4 left greater than right  RV: External hemorrhoids, No Rectal Masses and Normal Sphincter tone  MUSCULOSKELETAL: Normal range of motion. No tenderness.  No cyanosis, clubbing, or edema.  2+ distal pulses. LYMPHATIC: No Axillary, Supraclavicular, or Inguinal Adenopathy.    Assessment:   Annual gynecologic examination 55 y.o. Contraception: tubal ligation bmi- 34 Problem List Items Addressed This Visit    None    Visit Diagnoses   None.    Uterine fibroids Left ovarian cyst History of dysmenorrhea History of abdominal bloating History of endometriosis in family-daughter Plan:  Pap: pap w/hpv Mammogram: Ordered Stool Guaiac Testing:  thru pcp Labs: thru pcp Routine preventative health maintenance measures emphasized: Exercise/Diet/Weight control, Tobacco Warnings and Alcohol/Substance use  risks Schedule laparoscopy with peritoneal biopsies Return week before surgery for preoperative assessment Return to [place], CMA  Brayton Mars, MD  Note: This dictation was prepared with Dragon dictation along with smaller phrase technology. Any transcriptional errors that result from this process are unintentional.

## 2015-12-05 ENCOUNTER — Encounter: Payer: Self-pay | Admitting: Obstetrics and Gynecology

## 2015-12-05 ENCOUNTER — Ambulatory Visit (INDEPENDENT_AMBULATORY_CARE_PROVIDER_SITE_OTHER): Payer: Managed Care, Other (non HMO) | Admitting: Obstetrics and Gynecology

## 2015-12-05 VITALS — BP 136/81 | HR 67 | Ht 62.0 in | Wt 186.5 lb

## 2015-12-05 DIAGNOSIS — Z8041 Family history of malignant neoplasm of ovary: Secondary | ICD-10-CM

## 2015-12-05 DIAGNOSIS — D259 Leiomyoma of uterus, unspecified: Secondary | ICD-10-CM

## 2015-12-05 DIAGNOSIS — Z01818 Encounter for other preprocedural examination: Secondary | ICD-10-CM

## 2015-12-05 DIAGNOSIS — R140 Abdominal distension (gaseous): Secondary | ICD-10-CM

## 2015-12-05 DIAGNOSIS — N946 Dysmenorrhea, unspecified: Secondary | ICD-10-CM

## 2015-12-05 DIAGNOSIS — Z9889 Other specified postprocedural states: Secondary | ICD-10-CM

## 2015-12-05 DIAGNOSIS — R102 Pelvic and perineal pain: Secondary | ICD-10-CM

## 2015-12-05 DIAGNOSIS — Z842 Family history of other diseases of the genitourinary system: Secondary | ICD-10-CM

## 2015-12-05 NOTE — H&P (Signed)
Subjective:  PREOPERATIVE HISTORY AND PHYSICAL   Date of surgery: 12/10/2015 Procedure: Laparoscopy with peritoneal biopsies    Patient is a 55 y.o. G2P2019female, Status post BTL for contraception, scheduled for Laparoscopy with peritoneal biopsies on 12/10/2015 for evaluation of pelvic pain, abdominal bloating, ovarian cyst, uterine fibroids.  Initial presentation to the clinic was for right-sided pelvic pain evaluation which began in September 2017. Pain is intermittent, lasting seconds, without any exacerbating or alleviating factors. Bowel function is normal. Bladder function is normal. She denies deep thrusting dyspareunia.  11/20/2015 ultrasound    Findings: The uterus measures 7.9 x 4.1x 4.6cm. Echo texture is heterogenouswithevidence of focal masses. Within the uterus are multiple suspected fibroids measuring: Fibroid 1: Right Fundal with calcifications measures 1.4 x 1.5 x 1.5 cm. Fibroid 2: Left Fundal with calcifications measures 2 x 2.2 x 1.5 cm.  The Endometriummeasures 4.8 mm.  Right Ovary measures 2.5 x 1.8 x 2.2 cm. It is normal in appearance. Left Ovary measures 4.7 x 3.7 x 4 cm. Simple cyst on left ovary measures 3.1 x 2.9 x 2.9 cm. Survey of the adnexa demonstrates no adnexal masses. There is no free fluid in the cul de sac.  Impression: 1. Fibroid Uterus 2. Left ovarian cyst 3.1 cm   Pertinent Gynecological History: Menarche: Age 3 Intervals: Regular on a monthly basis Duration: 3 days Dysmenorrhea: Mild currently; past history of moderate dysmenorrhea which did cause her to miss school and work in the past on an infrequent basis. History of uterine fibroids. Status post uterine artery embolization in 2010 because of menorrhagia; post procedure bleeding normalized History of previous D&C 2 because of menorrhagia without hyperplasia or carcinoma findings Family history of ovarian cancer in maternal aunt; no genetic testing was performed. Family  history of breast cancer in a different maternal aunt Family history of endometriosis in daughter who recently underwent hysterectomy   Menstrual History: OB History    Gravida Para Term Preterm AB Living   2 2 2     2    SAB TAB Ectopic Multiple Live Births           2      Patient's last menstrual period was 10/30/2015.    Past Medical History:  Diagnosis Date  . Anxiety   . Arthritis   . Heart murmur   . Hypertension   . Vitamin D deficiency     Past Surgical History:  Procedure Laterality Date  . BREAST SURGERY     benign biopsy  . CHOLECYSTECTOMY    . DILATION AND CURETTAGE OF UTERUS    . SHOULDER ARTHROSCOPY Right   . TUBAL LIGATION    . UTERINE FIBROID EMBOLIZATION      OB History  Gravida Para Term Preterm AB Living  2 2 2     2   SAB TAB Ectopic Multiple Live Births          2    # Outcome Date GA Lbr Len/2nd Weight Sex Delivery Anes PTL Lv  2 Term 1988   7 lb 9.6 oz (3.447 kg) F Vag-Spont   LIV  1 Term 1986   7 lb 9.6 oz (3.447 kg) M Vag-Spont   LIV      Social History   Social History  . Marital status: Married    Spouse name: N/A  . Number of children: N/A  . Years of education: N/A   Social History Main Topics  . Smoking status: Current Every Day Smoker  Packs/day: 0.50    Types: Cigarettes  . Smokeless tobacco: Never Used  . Alcohol use 0.0 oz/week     Comment: Occasional  . Drug use: No  . Sexual activity: Yes    Birth control/ protection: Surgical     Comment: occasional   Other Topics Concern  . None   Social History Narrative  . None    Family History  Problem Relation Age of Onset  . Cancer Mother     cervical, lung  . Heart disease Father   . Hypertension Father   . Breast cancer Maternal Aunt   . Ovarian cancer Maternal Aunt   . Diabetes Neg Hx      (Not in a hospital admission)  Allergies  Allergen Reactions  . Hydroxychloroquine Itching  . Penicillin G Hives    Has patient had a PCN reaction causing  immediate rash, facial/tongue/throat swelling, SOB or lightheadedness with hypotension:unsure Has patient had a PCN reaction causing severe rash involving mucus membranes or skin necrosis:unsure Has patient had a PCN reaction that required hospitalization:unsure Has patient had a PCN reaction occurring within the last 10 years:No If all of the above answers are "NO", then may proceed with Cephalosporin use.   Marland Kitchen Penicillins     Review of Systems Constitutional: No recent fever/chills/sweats Respiratory: No recent cough/bronchitis Cardiovascular: No chest pain Gastrointestinal: No recent nausea/vomiting/diarrhea Genitourinary: No UTI symptoms Hematologic/lymphatic:No history of coagulopathy or recent blood thinner use    Objective:    BP 136/81   Pulse 67   Ht 5\' 2"  (1.575 m)   Wt 186 lb 8 oz (84.6 kg)   LMP 10/30/2015   BMI 34.11 kg/m   General:   Normal  Skin:   normal  HEENT:  Normal  Neck:  Supple without Adenopathy or Thyromegaly  Lungs:   Heart:              Breasts:   Abdomen:  Pelvis:  M/S   Extremeties:  Neuro:    clear to auscultation bilaterally   Normal without murmur   Not Examined   soft, non-tender; bowel sounds normal; no masses,  no organomegaly   Exam deferred to OR  No CVAT  Warm/Dry   Normal        11/13/2015 PELVIC:             External Genitalia: Normal             BUS: Normal             Vagina: Normal             Cervix: Parous; no lesions; mild cervical motion tenderness             Uterus: Enlarged 12 week size, irregular shape, normal consistency, mobile, slightly tender with left-sided prominence             Adnexa: Normal; nonpalpable nontender             RV: Normal external exam             Bladder: Nontender Assessment:    1. Pelvic pain 2. Dysmenorrhea 3. Family history of endometriosis 4. History of uterine fibroids 5. Status post uterine artery embolization 6. Family history of ovarian cancer   Plan:   Laparoscopy  with peritoneal biopsies  Preop counseling: Patient is to undergo laparoscopy with peritoneal biopsies on 12/10/2015. She is understanding of the planned procedure and is accepting of all surgical risks which includes but are  not limited to bleeding, infection, pelvic organ injury with need for repair, blood clot disorders, anesthesia risks, etc. All questions have been answered. Informed consent is given. Patient is ready and willing to proceed with surgery as scheduled.

## 2015-12-05 NOTE — Progress Notes (Signed)
Subjective:  PREOPERATIVE HISTORY AND PHYSICAL   Date of surgery: 12/10/2015 Procedure: Laparoscopy with peritoneal biopsies    Patient is a 55 y.o. G2P2037female scheduled for Laparoscopy with peritoneal biopsies on 12/10/2015 for evaluation of pelvic pain, abdominal bloating, ovarian cyst, uterine fibroids.  11/20/2015 ultrasound    Findings: The uterus measures 7.9 x 4.1x 4.6cm. Echo texture is heterogenouswithevidence of focal masses. Within the uterus are multiple suspected fibroids measuring: Fibroid 1: Right Fundal with calcifications measures 1.4 x 1.5 x 1.5 cm. Fibroid 2: Left Fundal with calcifications measures 2 x 2.2 x 1.5 cm.  The Endometriummeasures 4.8 mm.  Right Ovary measures 2.5 x 1.8 x 2.2 cm. It is normal in appearance. Left Ovary measures 4.7 x 3.7 x 4 cm. Simple cyst on left ovary measures 3.1 x 2.9 x 2.9 cm. Survey of the adnexa demonstrates no adnexal masses. There is no free fluid in the cul de sac.  Impression: 1. Fibroid Uterus 2. Left ovarian cyst 3.1 cm   Pertinent Gynecological History: Menarche: Age 37 Intervals: Regular on a monthly basis Duration: 3 days Dysmenorrhea: Mild currently; past history of moderate dysmenorrhea which did cause her to miss school and work in the past on an infrequent basis. History of uterine fibroids. Status post uterine artery embolization in 2010 because of menorrhagia; post procedure bleeding normalized History of previous D&C 2 because of menorrhagia without hyperplasia or carcinoma findings Family history of ovarian cancer in maternal aunt; no genetic testing was performed. Family history of breast cancer in a different maternal aunt Family history of endometriosis in daughter who recently underwent hysterectomy   Menstrual History: OB History    Gravida Para Term Preterm AB Living   2 2 2     2    SAB TAB Ectopic Multiple Live Births           2      Patient's last menstrual period was  10/30/2015.    Past Medical History:  Diagnosis Date  . Anxiety   . Arthritis   . Heart murmur   . Hypertension   . Vitamin D deficiency     Past Surgical History:  Procedure Laterality Date  . BREAST SURGERY     benign biopsy  . CHOLECYSTECTOMY    . DILATION AND CURETTAGE OF UTERUS    . SHOULDER ARTHROSCOPY Right   . TUBAL LIGATION    . UTERINE FIBROID EMBOLIZATION      OB History  Gravida Para Term Preterm AB Living  2 2 2     2   SAB TAB Ectopic Multiple Live Births          2    # Outcome Date GA Lbr Len/2nd Weight Sex Delivery Anes PTL Lv  2 Term 1988   7 lb 9.6 oz (3.447 kg) F Vag-Spont   LIV  1 Term 1986   7 lb 9.6 oz (3.447 kg) M Vag-Spont   LIV      Social History   Social History  . Marital status: Married    Spouse name: N/A  . Number of children: N/A  . Years of education: N/A   Social History Main Topics  . Smoking status: Current Every Day Smoker    Packs/day: 0.50    Types: Cigarettes  . Smokeless tobacco: Never Used  . Alcohol use 0.0 oz/week     Comment: Occasional  . Drug use: No  . Sexual activity: Yes    Birth control/ protection: Surgical  Comment: occasional   Other Topics Concern  . None   Social History Narrative  . None    Family History  Problem Relation Age of Onset  . Cancer Mother     cervical, lung  . Heart disease Father   . Hypertension Father   . Breast cancer Maternal Aunt   . Ovarian cancer Maternal Aunt   . Diabetes Neg Hx      (Not in a hospital admission)  Allergies  Allergen Reactions  . Hydroxychloroquine Itching  . Penicillin G Hives    Has patient had a PCN reaction causing immediate rash, facial/tongue/throat swelling, SOB or lightheadedness with hypotension:unsure Has patient had a PCN reaction causing severe rash involving mucus membranes or skin necrosis:unsure Has patient had a PCN reaction that required hospitalization:unsure Has patient had a PCN reaction occurring within the last 10  years:No If all of the above answers are "NO", then may proceed with Cephalosporin use.   Marland Kitchen Penicillins     Review of Systems Constitutional: No recent fever/chills/sweats Respiratory: No recent cough/bronchitis Cardiovascular: No chest pain Gastrointestinal: No recent nausea/vomiting/diarrhea Genitourinary: No UTI symptoms Hematologic/lymphatic:No history of coagulopathy or recent blood thinner use    Objective:    BP 136/81   Pulse 67   Ht 5\' 2"  (1.575 m)   Wt 186 lb 8 oz (84.6 kg)   LMP 10/30/2015   BMI 34.11 kg/m   General:   Normal  Skin:   normal  HEENT:  Normal  Neck:  Supple without Adenopathy or Thyromegaly  Lungs:   Heart:              Breasts:   Abdomen:  Pelvis:  M/S   Extremeties:  Neuro:    clear to auscultation bilaterally   Normal without murmur   Not Examined   soft, non-tender; bowel sounds normal; no masses,  no organomegaly   Exam deferred to OR  No CVAT  Warm/Dry   Normal        11/13/2015 PELVIC:             External Genitalia: Normal             BUS: Normal             Vagina: Normal             Cervix: Parous; no lesions; mild cervical motion tenderness             Uterus: Enlarged 12 week size, irregular shape, normal consistency, mobile, slightly tender with left-sided prominence             Adnexa: Normal; nonpalpable nontender             RV: Normal external exam             Bladder: Nontender Assessment:    1. Pelvic pain 2. Dysmenorrhea 3. Family history of endometriosis 4. History of uterine fibroids 5. Status post uterine artery embolization 6. Family history of ovarian cancer   Plan:   Laparoscopy with peritoneal biopsies  Preop counseling: Patient is to undergo laparoscopy with peritoneal biopsies on 12/10/2015. She is understanding of the planned procedure and is accepting of all surgical risks which includes but are not limited to bleeding, infection, pelvic organ injury with need for repair, blood clot  disorders, anesthesia risks, etc. All questions have been answered. Informed consent is given. Patient is ready and willing to proceed with surgery as scheduled.  Brayton Mars, MD  Note: This  dictation was prepared with Dragon dictation along with smaller phrase technology. Any transcriptional errors that result from this process are unintentional.

## 2015-12-05 NOTE — Patient Instructions (Signed)
1. Return in 1 week for postop check after surgery 

## 2015-12-06 ENCOUNTER — Encounter
Admission: RE | Admit: 2015-12-06 | Discharge: 2015-12-06 | Disposition: A | Discharge status: Home / Self Care | Payer: Managed Care, Other (non HMO) | Source: Ambulatory Visit | Attending: Obstetrics and Gynecology | Admitting: Obstetrics and Gynecology

## 2015-12-06 DIAGNOSIS — Z01812 Encounter for preprocedural laboratory examination: Secondary | ICD-10-CM | POA: Diagnosis present

## 2015-12-06 LAB — CBC WITH DIFFERENTIAL/PLATELET
BASOS ABS: 0 10*3/uL (ref 0–0.1)
BASOS PCT: 1 %
EOS PCT: 1 %
Eosinophils Absolute: 0.1 10*3/uL (ref 0–0.7)
HCT: 41.9 % (ref 35.0–47.0)
Hemoglobin: 14.6 g/dL (ref 12.0–16.0)
LYMPHS PCT: 21 %
Lymphs Abs: 1.8 10*3/uL (ref 1.0–3.6)
MCH: 30.8 pg (ref 26.0–34.0)
MCHC: 34.9 g/dL (ref 32.0–36.0)
MCV: 88.4 fL (ref 80.0–100.0)
MONO ABS: 0.6 10*3/uL (ref 0.2–0.9)
Monocytes Relative: 7 %
NEUTROS ABS: 6 10*3/uL (ref 1.4–6.5)
Neutrophils Relative %: 70 %
PLATELETS: 323 10*3/uL (ref 150–440)
RBC: 4.74 MIL/uL (ref 3.80–5.20)
RDW: 14.7 % — AB (ref 11.5–14.5)
WBC: 8.6 10*3/uL (ref 3.6–11.0)

## 2015-12-06 LAB — PAP IG AND HPV HIGH-RISK
HPV, high-risk: NEGATIVE
PAP SMEAR COMMENT: 0

## 2015-12-06 LAB — RAPID HIV SCREEN (HIV 1/2 AB+AG)
HIV 1/2 ANTIBODIES: NONREACTIVE
HIV-1 P24 ANTIGEN - HIV24: NONREACTIVE

## 2015-12-06 LAB — COLOGUARD: Cologuard: NEGATIVE

## 2015-12-06 NOTE — Patient Instructions (Signed)
  Your procedure is scheduled on: 12/10/15 Report to Day Surgery. [facility]OND [facility]LOOR To find out your arrival time please call [phone] between 1PM - 3PM on 12/07/15.  Remember: Instructions that are not followed completely may result in serious medical risk, up to and including death, or upon the discretion of your surgeon and anesthesiologist your surgery may need to be rescheduled.    __X__ 1. Do not eat food or drink liquids after midnight. No gum chewing or hard candies.     __X__ 2. No Alcohol for 24 hours before or after surgery.   __X__ 3. Do Not Smoke For 24 Hours Prior to Your Surgery.   ____ 4. Bring all medications with you on the day of surgery if instructed.    _X___ 5. Notify your doctor if there is any change in your medical condition     (cold, fever, infections).       Do not wear jewelry, make-up, hairpins, clips or nail polish.  Do not wear lotions, powders, or perfumes. You may wear deodorant.  Do not shave 48 hours prior to surgery. Men may shave face and neck.  Do not bring valuables to the hospital.    [facility] is not responsible for any belongings or valuables.               Contacts, dentures or bridgework may not be worn into surgery.  Leave your suitcase in the car. After surgery it may be brought to your room.  For patients admitted to the hospital, discharge time is determined by your                treatment team.   Patients discharged the day of surgery will not be allowed to drive home.   X____ Take these medicines the morning of surgery with A SIP OF WATER:    1.AMLODIPINE  2. FLUOXETINE   3. METOPROLOL  4.  5.  6.  ____ Fleet Enema (as directed)   __X__ Use CHG Soap as directed  ____ Use inhalers on the day of surgery  ____ Stop metformin 2 days prior to surgery    ____ Take 1/2 of usual insulin dose the night before surgery and none on the morning of surgery.   ____ Stop Coumadin/Plavix/aspirin on  _X___ Stop  Anti-inflammatories on  ETODOLAC /NAPROXYN   ____ Stop supplements until after surgery.    ____ Bring C-Pap to the hospital.

## 2015-12-07 LAB — RPR: RPR Ser Ql: NONREACTIVE

## 2015-12-10 ENCOUNTER — Ambulatory Visit: Payer: Managed Care, Other (non HMO) | Admitting: Anesthesiology

## 2015-12-10 ENCOUNTER — Ambulatory Visit
Admission: RE | Admit: 2015-12-10 | Discharge: 2015-12-10 | Disposition: A | Discharge status: Home / Self Care | Payer: Managed Care, Other (non HMO) | Source: Ambulatory Visit | Attending: Obstetrics and Gynecology | Admitting: Obstetrics and Gynecology

## 2015-12-10 ENCOUNTER — Encounter
Admission: RE | Disposition: A | Discharge status: Home / Self Care | Payer: Self-pay | Source: Ambulatory Visit | Attending: Obstetrics and Gynecology

## 2015-12-10 DIAGNOSIS — F419 Anxiety disorder, unspecified: Secondary | ICD-10-CM | POA: Insufficient documentation

## 2015-12-10 DIAGNOSIS — Z801 Family history of malignant neoplasm of trachea, bronchus and lung: Secondary | ICD-10-CM | POA: Diagnosis not present

## 2015-12-10 DIAGNOSIS — G8929 Other chronic pain: Secondary | ICD-10-CM | POA: Diagnosis present

## 2015-12-10 DIAGNOSIS — R102 Pelvic and perineal pain: Secondary | ICD-10-CM

## 2015-12-10 DIAGNOSIS — N946 Dysmenorrhea, unspecified: Secondary | ICD-10-CM | POA: Diagnosis not present

## 2015-12-10 DIAGNOSIS — D259 Leiomyoma of uterus, unspecified: Secondary | ICD-10-CM | POA: Diagnosis not present

## 2015-12-10 DIAGNOSIS — F17210 Nicotine dependence, cigarettes, uncomplicated: Secondary | ICD-10-CM | POA: Diagnosis not present

## 2015-12-10 DIAGNOSIS — Z9889 Other specified postprocedural states: Secondary | ICD-10-CM | POA: Insufficient documentation

## 2015-12-10 DIAGNOSIS — Z9049 Acquired absence of other specified parts of digestive tract: Secondary | ICD-10-CM | POA: Insufficient documentation

## 2015-12-10 DIAGNOSIS — Z803 Family history of malignant neoplasm of breast: Secondary | ICD-10-CM | POA: Diagnosis not present

## 2015-12-10 DIAGNOSIS — N83202 Unspecified ovarian cyst, left side: Secondary | ICD-10-CM | POA: Diagnosis not present

## 2015-12-10 DIAGNOSIS — E559 Vitamin D deficiency, unspecified: Secondary | ICD-10-CM | POA: Insufficient documentation

## 2015-12-10 DIAGNOSIS — N838 Other noninflammatory disorders of ovary, fallopian tube and broad ligament: Secondary | ICD-10-CM | POA: Insufficient documentation

## 2015-12-10 DIAGNOSIS — Z8041 Family history of malignant neoplasm of ovary: Secondary | ICD-10-CM | POA: Insufficient documentation

## 2015-12-10 DIAGNOSIS — M1990 Unspecified osteoarthritis, unspecified site: Secondary | ICD-10-CM | POA: Diagnosis not present

## 2015-12-10 DIAGNOSIS — Z888 Allergy status to other drugs, medicaments and biological substances status: Secondary | ICD-10-CM | POA: Insufficient documentation

## 2015-12-10 DIAGNOSIS — Z9851 Tubal ligation status: Secondary | ICD-10-CM | POA: Diagnosis not present

## 2015-12-10 DIAGNOSIS — I10 Essential (primary) hypertension: Secondary | ICD-10-CM | POA: Insufficient documentation

## 2015-12-10 DIAGNOSIS — D282 Benign neoplasm of uterine tubes and ligaments: Secondary | ICD-10-CM | POA: Insufficient documentation

## 2015-12-10 DIAGNOSIS — Z880 Allergy status to penicillin: Secondary | ICD-10-CM | POA: Diagnosis not present

## 2015-12-10 HISTORY — PX: LAPAROSCOPIC LYSIS OF ADHESIONS: SHX5905

## 2015-12-10 HISTORY — PX: LAPAROSCOPY: SHX197

## 2015-12-10 LAB — POCT PREGNANCY, URINE: PREG TEST UR: NEGATIVE

## 2015-12-10 SURGERY — LAPAROSCOPY, DIAGNOSTIC
Anesthesia: General

## 2015-12-10 MED ORDER — MIDAZOLAM HCL 2 MG/2ML IJ SOLN
INTRAMUSCULAR | Status: DC | PRN
  Administered 2015-12-10: 2 mg via INTRAVENOUS

## 2015-12-10 MED ORDER — ONDANSETRON HCL 4 MG/2ML IJ SOLN
INTRAMUSCULAR | Status: DC | PRN
  Administered 2015-12-10: 4 mg via INTRAVENOUS

## 2015-12-10 MED ORDER — FENTANYL CITRATE (PF) 100 MCG/2ML IJ SOLN
25.0000 ug | INTRAMUSCULAR | Status: DC | PRN
  Administered 2015-12-10 (×2): 25 ug via INTRAVENOUS

## 2015-12-10 MED ORDER — OXYCODONE-ACETAMINOPHEN 5-325 MG PO TABS: 1.0000 | ORAL_TABLET | ORAL | 0 refills | Status: DC | PRN

## 2015-12-10 MED ORDER — ONDANSETRON HCL 4 MG/2ML IJ SOLN: 4.0000 mg | Freq: Once | INTRAMUSCULAR | Status: DC | PRN

## 2015-12-10 MED ORDER — LIDOCAINE HCL (CARDIAC) 20 MG/ML IV SOLN
INTRAVENOUS | Status: DC | PRN
  Administered 2015-12-10: 100 mg via INTRAVENOUS

## 2015-12-10 MED ORDER — LACTATED RINGERS IV SOLN
INTRAVENOUS | Status: DC
  Administered 2015-12-10: 15:00:00 via INTRAVENOUS

## 2015-12-10 MED ORDER — EPHEDRINE SULFATE 50 MG/ML IJ SOLN
INTRAMUSCULAR | Status: DC | PRN
  Administered 2015-12-10: 10 mg via INTRAVENOUS

## 2015-12-10 MED ORDER — PROPOFOL 10 MG/ML IV BOLUS
INTRAVENOUS | Status: DC | PRN
  Administered 2015-12-10: 200 mg via INTRAVENOUS

## 2015-12-10 MED ORDER — FENTANYL CITRATE (PF) 100 MCG/2ML IJ SOLN
INTRAMUSCULAR | Status: AC
  Administered 2015-12-10: 25 ug via INTRAVENOUS
  Filled 2015-12-10: qty 2

## 2015-12-10 MED ORDER — SUGAMMADEX SODIUM 200 MG/2ML IV SOLN
INTRAVENOUS | Status: DC | PRN
  Administered 2015-12-10: 200 mg via INTRAVENOUS

## 2015-12-10 MED ORDER — FAMOTIDINE 20 MG PO TABS
20.0000 mg | ORAL_TABLET | Freq: Once | ORAL | Status: AC
  Administered 2015-12-10: 20 mg via ORAL

## 2015-12-10 MED ORDER — FENTANYL CITRATE (PF) 100 MCG/2ML IJ SOLN
INTRAMUSCULAR | Status: DC | PRN
  Administered 2015-12-10: 50 ug via INTRAVENOUS
  Administered 2015-12-10: 100 ug via INTRAVENOUS
  Administered 2015-12-10 (×2): 50 ug via INTRAVENOUS

## 2015-12-10 MED ORDER — DEXAMETHASONE SODIUM PHOSPHATE 10 MG/ML IJ SOLN
INTRAMUSCULAR | Status: DC | PRN
  Administered 2015-12-10: 10 mg via INTRAVENOUS

## 2015-12-10 MED ORDER — LACTATED RINGERS IV SOLN
INTRAVENOUS | Status: DC
  Administered 2015-12-10 (×2): via INTRAVENOUS

## 2015-12-10 MED ORDER — ROCURONIUM BROMIDE 100 MG/10ML IV SOLN
INTRAVENOUS | Status: DC | PRN
  Administered 2015-12-10: 50 mg via INTRAVENOUS

## 2015-12-10 MED ORDER — FAMOTIDINE 20 MG PO TABS
ORAL_TABLET | ORAL | Status: AC
  Administered 2015-12-10: 20 mg via ORAL
  Filled 2015-12-10: qty 1

## 2015-12-10 SURGICAL SUPPLY — 36 items
BLADE SURG SZ11 CARB STEEL (BLADE) ×4 IMPLANT
CANISTER SUCT 1200ML W/VALVE (MISCELLANEOUS) ×4 IMPLANT
CATH ROBINSON RED A/P 16FR (CATHETERS) ×4 IMPLANT
CHLORAPREP W/TINT 26ML (MISCELLANEOUS) ×4 IMPLANT
DRESSING TELFA 4X3 1S ST N-ADH (GAUZE/BANDAGES/DRESSINGS) ×12 IMPLANT
DRSG TEGADERM 2-3/8X2-3/4 SM (GAUZE/BANDAGES/DRESSINGS) ×12 IMPLANT
DRSG TEGADERM 4X4.75 (GAUZE/BANDAGES/DRESSINGS) ×12 IMPLANT
DRSG TELFA 3X8 NADH (GAUZE/BANDAGES/DRESSINGS) ×4 IMPLANT
GLOVE BIO SURGEON STRL SZ8 (GLOVE) ×4 IMPLANT
GLOVE INDICATOR 8.0 STRL GRN (GLOVE) ×4 IMPLANT
GOWN STRL REUS W/ TWL LRG LVL3 (GOWN DISPOSABLE) ×2 IMPLANT
GOWN STRL REUS W/ TWL XL LVL3 (GOWN DISPOSABLE) ×2 IMPLANT
GOWN STRL REUS W/TWL LRG LVL3 (GOWN DISPOSABLE) ×2
GOWN STRL REUS W/TWL XL LVL3 (GOWN DISPOSABLE) ×2
IRRIGATION STRYKERFLOW (MISCELLANEOUS) ×2 IMPLANT
IRRIGATOR STRYKERFLOW (MISCELLANEOUS) ×4
IV LACTATED RINGERS 1000ML (IV SOLUTION) ×4 IMPLANT
KIT PINK PAD W/HEAD ARE REST (MISCELLANEOUS) ×4
KIT PINK PAD W/HEAD ARM REST (MISCELLANEOUS) ×2 IMPLANT
KIT RM TURNOVER CYSTO AR (KITS) ×4 IMPLANT
LABEL OR SOLS (LABEL) IMPLANT
LIQUID BAND (GAUZE/BANDAGES/DRESSINGS) ×4 IMPLANT
NS IRRIG 500ML POUR BTL (IV SOLUTION) ×4 IMPLANT
PACK GYN LAPAROSCOPIC (MISCELLANEOUS) ×4 IMPLANT
PAD OB MATERNITY 4.3X12.25 (PERSONAL CARE ITEMS) ×4 IMPLANT
PAD PREP 24X41 OB/GYN DISP (PERSONAL CARE ITEMS) ×4 IMPLANT
POUCH ENDO CATCH 10MM SPEC (MISCELLANEOUS) IMPLANT
SCISSORS METZENBAUM CVD 33 (INSTRUMENTS) IMPLANT
SLEEVE ENDOPATH XCEL 5M (ENDOMECHANICALS) ×4 IMPLANT
SUT MNCRL 4-0 (SUTURE) ×2
SUT MNCRL 4-0 27XMFL (SUTURE) ×2
SUT VIC AB 0 UR5 27 (SUTURE) ×4 IMPLANT
SUTURE MNCRL 4-0 27XMF (SUTURE) ×2 IMPLANT
SYR 30ML LL (SYRINGE) ×4 IMPLANT
TROCAR XCEL NON-BLD 5MMX100MML (ENDOMECHANICALS) ×4 IMPLANT
TUBING INSUFFLATOR HI FLOW (MISCELLANEOUS) ×4 IMPLANT

## 2015-12-10 NOTE — Op Note (Addendum)
OPERATIVE NOTE:  Nichole Wells PROCEDURE DATE: 12/10/2015   PREOPERATIVE DIAGNOSIS:  1. Chronic pelvic pain 2. Fibroid uterus 3. Left ovarian cyst  POSTOPERATIVE DIAGNOSIS:  1. Chronic pelvic pain 2. Fibroid uterus 3. Left ovarian cyst 4. Pelvic adhesions, mild  PROCEDURE:  1. Laparoscopy with adhesiolysis and peritoneal biopsies   SURGEON:  Brayton Mars, MD ASSISTANTS: PA-S Alla German ANESTHESIA: General INDICATIONS: 55 y.o. G2P2002 with history of worsening pelvic pain, dysmenorrhea, known uterine fibroids and left ovarian cyst, presents for surgical evaluation.  FINDINGS:   1. Gynecoid pelvis (LAVH candidate) 2. 3 cm simple left ovarian cyst, drained  3. Yellow cystic lesions of fallopian tube, biopsy 4. Lower uterine segment nodules consistent with fibroids, less than 1 cm each 5. Evidence of prior tubal ligation with evidence of HULKA CLIPS present   I/O's: No intake/output data recorded. COUNTS:  YES SPECIMENS:  1. Left uterosacral ligament biopsy/adhesion 2. Lower uterine segment, left sided, white nodule 3. Lower uterine segment, right sided, fibroid 4. Left fallopian tube yellow lesions (cystic)  ANTIBIOTIC PROPHYLAXIS:N/A COMPLICATIONS: None immediate  PROCEDURE IN DETAIL: Patient brought to the operating room placed in the supine position. General endotracheal anesthesia was induced without difficulty. She is placed in dorsal lithotomy position using the bumblebee stirrups. A ChloraPrep and Hibiclens abdominal perineal intravaginal prep and drape was performed in standard fashion. Timeout was performed. Bladder was drained of 25 cc of urine with a red Robinson catheter. TENACULUM was placed on the cervix to facilitate uterine manipulation Laparoscopy was performed in standard fashion. 5 mm vertical incision is made in the subumbilical. The Optiview laparoscopic trocar system is placed intra-abdominally through a direct entry technique without evidence  of bowel or vascular injury. A second 5 mm port was placed in the lower uterine segment under direct visualization. The above-noted findings were photo documented. The abnormal lesions identified were biopsied. Kleppinger bipolar forceps were used on the biopsy sites to facilitate hemostasis. the left ovarian cyst was opened with scissors and drained of clear serous fluid.  Irrigation of the pelvis was performed at all biopsy sites. The irrigant fluid was aspirated. Once satisfied with complete survey of the pelvis and abdomen, procedure was terminated, with all instrumentation being removed from the abdominal pelvic cavity. Pneumoperitoneum was released. The incisions were closed with simple interrupted sutures of 4-0 Monocryl. Dermabond glue was placed over the incisions. Patient is awakened mobilized and taken to recovery room in satisfactory condition.  Martin A. Zipporah Plants, MD, ACOG [facility]

## 2015-12-10 NOTE — Anesthesia Procedure Notes (Signed)
Procedure Name: Intubation Date/Time: 12/10/2015 1:30 PM Performed by: Leander Rams Pre-anesthesia Checklist: Patient identified, Emergency Drugs available, Suction available, Patient being monitored and Timeout performed Patient Re-evaluated:Patient Re-evaluated prior to inductionOxygen Delivery Method: Circle system utilized Preoxygenation: Pre-oxygenation with 100% oxygen Intubation Type: IV induction Ventilation: Mask ventilation without difficulty Laryngoscope Size: Mac and 3 Grade View: Grade I Tube type: Oral Tube size: 7.0 mm Number of attempts: 1 Airway Equipment and Method: Stylet Secured at: 22 cm Dental Injury: Teeth and Oropharynx as per pre-operative assessment

## 2015-12-10 NOTE — Anesthesia Preprocedure Evaluation (Signed)
Anesthesia Evaluation  Patient identified by MRN, date of birth, ID band Patient awake    Reviewed: Allergy & Precautions, H&P , NPO status , Patient's Chart, lab work & pertinent test results, reviewed documented beta blocker date and time   History of Anesthesia Complications Negative for: history of anesthetic complications  Airway Mallampati: III  TM Distance: >3 FB Neck ROM: full    Dental no notable dental hx. (+) Teeth Intact   Pulmonary neg shortness of breath, sleep apnea , neg COPD, neg recent URI, Current Smoker,           Cardiovascular Exercise Tolerance: Good hypertension, On Medications (-) angina(-) CAD, (-) Past MI, (-) Cardiac Stents and (-) CABG (-) dysrhythmias + Valvular Problems/Murmurs      Neuro/Psych PSYCHIATRIC DISORDERS (Anxiety and Depression) negative neurological ROS     GI/Hepatic negative GI ROS, Neg liver ROS,   Endo/Other  negative endocrine ROS  Renal/GU negative Renal ROS  negative genitourinary   Musculoskeletal   Abdominal   Peds  Hematology negative hematology ROS (+)   Anesthesia Other Findings Past Medical History: No date: Anxiety No date: Arthritis     Comment: poss ra No date: Heart murmur No date: Hypertension No date: Vitamin D deficiency   Reproductive/Obstetrics negative OB ROS                             Anesthesia Physical Anesthesia Plan  ASA: II  Anesthesia Plan: General   Post-op Pain Management:    Induction:   Airway Management Planned:   Additional Equipment:   Intra-op Plan:   Post-operative Plan:   Informed Consent: I have reviewed the patients History and Physical, chart, labs and discussed the procedure including the risks, benefits and alternatives for the proposed anesthesia with the patient or authorized representative who has indicated his/her understanding and acceptance.   Dental Advisory Given  Plan  Discussed with: Anesthesiologist, CRNA and Surgeon  Anesthesia Plan Comments:         Anesthesia Quick Evaluation

## 2015-12-10 NOTE — H&P (View-Only) (Signed)
Subjective:  PREOPERATIVE HISTORY AND PHYSICAL   Date of surgery: 12/10/2015 Procedure: Laparoscopy with peritoneal biopsies    Patient is a 55 y.o. G2P2087female, Status post BTL for contraception, scheduled for Laparoscopy with peritoneal biopsies on 12/10/2015 for evaluation of pelvic pain, abdominal bloating, ovarian cyst, uterine fibroids.  Initial presentation to the clinic was for right-sided pelvic pain evaluation which began in September 2017. Pain is intermittent, lasting seconds, without any exacerbating or alleviating factors. Bowel function is normal. Bladder function is normal. She denies deep thrusting dyspareunia.  11/20/2015 ultrasound    Findings: The uterus measures 7.9 x 4.1x 4.6cm. Echo texture is heterogenouswithevidence of focal masses. Within the uterus are multiple suspected fibroids measuring: Fibroid 1: Right Fundal with calcifications measures 1.4 x 1.5 x 1.5 cm. Fibroid 2: Left Fundal with calcifications measures 2 x 2.2 x 1.5 cm.  The Endometriummeasures 4.8 mm.  Right Ovary measures 2.5 x 1.8 x 2.2 cm. It is normal in appearance. Left Ovary measures 4.7 x 3.7 x 4 cm. Simple cyst on left ovary measures 3.1 x 2.9 x 2.9 cm. Survey of the adnexa demonstrates no adnexal masses. There is no free fluid in the cul de sac.  Impression: 1. Fibroid Uterus 2. Left ovarian cyst 3.1 cm   Pertinent Gynecological History: Menarche: Age 25 Intervals: Regular on a monthly basis Duration: 3 days Dysmenorrhea: Mild currently; past history of moderate dysmenorrhea which did cause her to miss school and work in the past on an infrequent basis. History of uterine fibroids. Status post uterine artery embolization in 2010 because of menorrhagia; post procedure bleeding normalized History of previous D&C 2 because of menorrhagia without hyperplasia or carcinoma findings Family history of ovarian cancer in maternal aunt; no genetic testing was performed. Family  history of breast cancer in a different maternal aunt Family history of endometriosis in daughter who recently underwent hysterectomy   Menstrual History: OB History    Gravida Para Term Preterm AB Living   2 2 2     2    SAB TAB Ectopic Multiple Live Births           2      Patient's last menstrual period was 10/30/2015.    Past Medical History:  Diagnosis Date  . Anxiety   . Arthritis   . Heart murmur   . Hypertension   . Vitamin D deficiency     Past Surgical History:  Procedure Laterality Date  . BREAST SURGERY     benign biopsy  . CHOLECYSTECTOMY    . DILATION AND CURETTAGE OF UTERUS    . SHOULDER ARTHROSCOPY Right   . TUBAL LIGATION    . UTERINE FIBROID EMBOLIZATION      OB History  Gravida Para Term Preterm AB Living  2 2 2     2   SAB TAB Ectopic Multiple Live Births          2    # Outcome Date GA Lbr Len/2nd Weight Sex Delivery Anes PTL Lv  2 Term 1988   7 lb 9.6 oz (3.447 kg) F Vag-Spont   LIV  1 Term 1986   7 lb 9.6 oz (3.447 kg) M Vag-Spont   LIV      Social History   Social History  . Marital status: Married    Spouse name: N/A  . Number of children: N/A  . Years of education: N/A   Social History Main Topics  . Smoking status: Current Every Day Smoker  Packs/day: 0.50    Types: Cigarettes  . Smokeless tobacco: Never Used  . Alcohol use 0.0 oz/week     Comment: Occasional  . Drug use: No  . Sexual activity: Yes    Birth control/ protection: Surgical     Comment: occasional   Other Topics Concern  . None   Social History Narrative  . None    Family History  Problem Relation Age of Onset  . Cancer Mother     cervical, lung  . Heart disease Father   . Hypertension Father   . Breast cancer Maternal Aunt   . Ovarian cancer Maternal Aunt   . Diabetes Neg Hx      (Not in a hospital admission)  Allergies  Allergen Reactions  . Hydroxychloroquine Itching  . Penicillin G Hives    Has patient had a PCN reaction causing  immediate rash, facial/tongue/throat swelling, SOB or lightheadedness with hypotension:unsure Has patient had a PCN reaction causing severe rash involving mucus membranes or skin necrosis:unsure Has patient had a PCN reaction that required hospitalization:unsure Has patient had a PCN reaction occurring within the last 10 years:No If all of the above answers are "NO", then may proceed with Cephalosporin use.   Marland Kitchen Penicillins     Review of Systems Constitutional: No recent fever/chills/sweats Respiratory: No recent cough/bronchitis Cardiovascular: No chest pain Gastrointestinal: No recent nausea/vomiting/diarrhea Genitourinary: No UTI symptoms Hematologic/lymphatic:No history of coagulopathy or recent blood thinner use    Objective:    BP 136/81   Pulse 67   Ht 5\' 2"  (1.575 m)   Wt 186 lb 8 oz (84.6 kg)   LMP 10/30/2015   BMI 34.11 kg/m   General:   Normal  Skin:   normal  HEENT:  Normal  Neck:  Supple without Adenopathy or Thyromegaly  Lungs:   Heart:              Breasts:   Abdomen:  Pelvis:  M/S   Extremeties:  Neuro:    clear to auscultation bilaterally   Normal without murmur   Not Examined   soft, non-tender; bowel sounds normal; no masses,  no organomegaly   Exam deferred to OR  No CVAT  Warm/Dry   Normal        11/13/2015 PELVIC:             External Genitalia: Normal             BUS: Normal             Vagina: Normal             Cervix: Parous; no lesions; mild cervical motion tenderness             Uterus: Enlarged 12 week size, irregular shape, normal consistency, mobile, slightly tender with left-sided prominence             Adnexa: Normal; nonpalpable nontender             RV: Normal external exam             Bladder: Nontender Assessment:    1. Pelvic pain 2. Dysmenorrhea 3. Family history of endometriosis 4. History of uterine fibroids 5. Status post uterine artery embolization 6. Family history of ovarian cancer   Plan:   Laparoscopy  with peritoneal biopsies  Preop counseling: Patient is to undergo laparoscopy with peritoneal biopsies on 12/10/2015. She is understanding of the planned procedure and is accepting of all surgical risks which includes but are  not limited to bleeding, infection, pelvic organ injury with need for repair, blood clot disorders, anesthesia risks, etc. All questions have been answered. Informed consent is given. Patient is ready and willing to proceed with surgery as scheduled.

## 2015-12-10 NOTE — Discharge Instructions (Signed)

## 2015-12-10 NOTE — Interval H&P Note (Signed)
History and Physical Interval Note:  12/10/2015 12:46 PM  Nichole Wells  has presented today for surgery, with the diagnosis of uterine leiomyoma, dysmenorrhea,pelvic pain  The various methods of treatment have been discussed with the patient and family. After consideration of risks, benefits and other options for treatment, the patient has consented to  Procedure(s): LAPAROSCOPY DIAGNOSTIC WITH BIOPSIES (N/A) as a surgical intervention .  The patient's history has been reviewed, patient examined, no change in status, stable for surgery.  I have reviewed the patient's chart and labs.  Questions were answered to the patient's satisfaction.     Hassell Done A Defrancesco

## 2015-12-10 NOTE — Transfer of Care (Signed)
Immediate Anesthesia Transfer of Care Note  Patient: Nichole Wells  Procedure(s) Performed: Procedure(s): LAPAROSCOPY DIAGNOSTIC WITH BIOPSIES (N/A) LAPAROSCOPIC LYSIS OF ADHESIONS  Patient Location: PACU  Anesthesia Type:General  Level of Consciousness: awake  Airway & Oxygen Therapy: Patient Spontanous Breathing  Post-op Assessment: Report given to RN  Post vital signs: stable  Last Vitals:  Vitals:   12/10/15 1117  BP: 124/65  Pulse: 62  Resp: 16  Temp: 36.6 C    Last Pain:  Vitals:   12/10/15 1117  TempSrc: Oral         Complications: No apparent anesthesia complications

## 2015-12-11 ENCOUNTER — Encounter: Payer: Self-pay | Admitting: Obstetrics and Gynecology

## 2015-12-12 LAB — SURGICAL PATHOLOGY

## 2015-12-12 NOTE — Anesthesia Postprocedure Evaluation (Signed)
Anesthesia Post Note  Patient: Carlyn Deshler  Procedure(s) Performed: Procedure(s) (LRB): LAPAROSCOPY DIAGNOSTIC WITH BIOPSIES (N/A) LAPAROSCOPIC LYSIS OF ADHESIONS  Patient location during evaluation: PACU Anesthesia Type: General Level of consciousness: awake and alert Pain management: pain level controlled Vital Signs Assessment: post-procedure vital signs reviewed and stable Respiratory status: spontaneous breathing, nonlabored ventilation and respiratory function stable Cardiovascular status: blood pressure returned to baseline and stable Postop Assessment: no signs of nausea or vomiting Anesthetic complications: no    Last Vitals:  Vitals:   12/10/15 1521 12/10/15 1553  BP: 115/63 (!) 116/58  Pulse: 80 76  Resp: 13 16  Temp:      Last Pain:  Vitals:   12/11/15 0850  TempSrc:   PainSc: 1                  Martha Clan

## 2015-12-18 ENCOUNTER — Ambulatory Visit (INDEPENDENT_AMBULATORY_CARE_PROVIDER_SITE_OTHER): Payer: Managed Care, Other (non HMO) | Admitting: Obstetrics and Gynecology

## 2015-12-18 ENCOUNTER — Encounter: Payer: Self-pay | Admitting: Obstetrics and Gynecology

## 2015-12-18 VITALS — BP 115/69 | HR 65 | Ht 62.0 in | Wt 184.9 lb

## 2015-12-18 DIAGNOSIS — R102 Pelvic and perineal pain: Secondary | ICD-10-CM

## 2015-12-18 DIAGNOSIS — D259 Leiomyoma of uterus, unspecified: Secondary | ICD-10-CM

## 2015-12-18 DIAGNOSIS — Z9889 Other specified postprocedural states: Secondary | ICD-10-CM

## 2015-12-18 DIAGNOSIS — N946 Dysmenorrhea, unspecified: Secondary | ICD-10-CM

## 2015-12-18 DIAGNOSIS — R140 Abdominal distension (gaseous): Secondary | ICD-10-CM

## 2015-12-18 DIAGNOSIS — Z09 Encounter for follow-up examination after completed treatment for conditions other than malignant neoplasm: Secondary | ICD-10-CM

## 2015-12-18 NOTE — Progress Notes (Signed)
Chief complaint: 1. One week postop check 2. Status post laparoscopy with peritoneal biopsies  Patient presents for 1 week follow-up after surgery. She is doing well bowel and bladder function. She is not having any significant pelvic pain.  Findings from surgery were reviewed including multiple small fibroids, scarring of uterosacral ligament with biopsy showing fibroadipose tissue without evidence of endometriosis.  While the findings in surgery did not confirm endometriosis in this patient who has a family history of endometriosis, she is concerned about the persistent chronic pain, abnormal bleeding, and bloating symptoms. She wishes to proceed with definitive surgery.  Pathology: DIAGNOSIS:  A. LEFT UTEROSACRAL LIGAMENT; BIOPSY:  - FIBROADIPOSE TISSUE.   B. LOWER LEFT UTERINE SEGMENT NODULE; BIOPSY:  - DENSE FIBROMUSCULAR TISSUE WITH FEATURES OF A LEIOMYOMA.  - NEGATIVE FOR ATYPIA, INCREASED MITOSES AND NECROSIS.   C. LOWER RIGHT UTERINE SEGMENT NODULE; BIOPSY:  - FEATURES OF A LEIOMYOMA.  - NEGATIVE FOR ATYPIA, INCREASED MITOSES AND NECROSIS.   D. YELLOW LESION, LEFT FALLOPIAN TUBE; BIOPSY:  - PARATUBAL CYSTS.    OBJECTIVE: BP 115/69   Pulse 65   Ht 5\' 2"  (1.575 m)   Wt 184 lb 14.4 oz (83.9 kg)   LMP 10/12/2015 (Approximate)   BMI 33.82 kg/m  Pleasant female in no acute distress. Abdomen: Soft, nontender; laparoscopy incision sites are healing well without evidence of hernia. Dermabond glue persists.  ASSESSMENT: 1. Chronic pelvic pain 2. Abdominal bloating 3. Family history of ovarian cancer 4. Family history of endometriosis 5. Uterine fibroids 6. Dense post uterine artery embolization 7. Abnormal uterine bleeding  PLAN: 1. Options of management were reviewed including medical or definitive surgical management 2. Patient decides to proceed with definitive surgery. LAVH BSO is scheduled for mid December 3. Patient is to return for preoperative appointment 1  week before surgery.  Brayton Mars, MD  Note: This dictation was prepared with Dragon dictation along with smaller phrase technology. Any transcriptional errors that result from this process are unintentional.

## 2015-12-18 NOTE — Patient Instructions (Signed)
1. Resume activities as tolerated 2. LAVH BSO is to be scheduled today for mid December 2018 3. Return in 1 week prior to surgery for preoperative appointment

## 2016-01-01 ENCOUNTER — Encounter (HOSPITAL_COMMUNITY): Payer: Self-pay

## 2016-01-01 ENCOUNTER — Ambulatory Visit
Admission: RE | Admit: 2016-01-01 | Discharge: 2016-01-01 | Disposition: A | Discharge status: Home / Self Care | Payer: Managed Care, Other (non HMO) | Source: Ambulatory Visit | Attending: Obstetrics and Gynecology | Admitting: Obstetrics and Gynecology

## 2016-01-01 DIAGNOSIS — Z1231 Encounter for screening mammogram for malignant neoplasm of breast: Secondary | ICD-10-CM | POA: Diagnosis not present

## 2016-01-01 DIAGNOSIS — Z1239 Encounter for other screening for malignant neoplasm of breast: Secondary | ICD-10-CM

## 2016-01-03 ENCOUNTER — Other Ambulatory Visit: Payer: Self-pay | Admitting: Family Medicine

## 2016-01-03 DIAGNOSIS — I10 Essential (primary) hypertension: Secondary | ICD-10-CM

## 2016-01-11 ENCOUNTER — Other Ambulatory Visit: Payer: Self-pay | Admitting: *Deleted

## 2016-01-11 ENCOUNTER — Inpatient Hospital Stay
Admission: RE | Admit: 2016-01-11 | Discharge: 2016-01-11 | Disposition: A | Discharge status: Home / Self Care | Payer: Self-pay | Source: Ambulatory Visit | Attending: *Deleted | Admitting: *Deleted

## 2016-01-11 DIAGNOSIS — Z9289 Personal history of other medical treatment: Secondary | ICD-10-CM

## 2016-01-22 ENCOUNTER — Other Ambulatory Visit: Payer: Self-pay | Admitting: Family Medicine

## 2016-01-22 DIAGNOSIS — I10 Essential (primary) hypertension: Secondary | ICD-10-CM

## 2016-01-23 ENCOUNTER — Encounter: Payer: Self-pay | Admitting: Obstetrics and Gynecology

## 2016-01-23 ENCOUNTER — Encounter
Admission: RE | Admit: 2016-01-23 | Discharge: 2016-01-23 | Disposition: A | Discharge status: Home / Self Care | Payer: Managed Care, Other (non HMO) | Source: Ambulatory Visit | Attending: Obstetrics and Gynecology | Admitting: Obstetrics and Gynecology

## 2016-01-23 ENCOUNTER — Ambulatory Visit (INDEPENDENT_AMBULATORY_CARE_PROVIDER_SITE_OTHER): Payer: Managed Care, Other (non HMO) | Admitting: Obstetrics and Gynecology

## 2016-01-23 VITALS — BP 152/80 | HR 64 | Ht 62.0 in | Wt 188.1 lb

## 2016-01-23 DIAGNOSIS — Z01812 Encounter for preprocedural laboratory examination: Secondary | ICD-10-CM | POA: Insufficient documentation

## 2016-01-23 DIAGNOSIS — Z01810 Encounter for preprocedural cardiovascular examination: Secondary | ICD-10-CM | POA: Diagnosis present

## 2016-01-23 DIAGNOSIS — R001 Bradycardia, unspecified: Secondary | ICD-10-CM | POA: Diagnosis not present

## 2016-01-23 DIAGNOSIS — D259 Leiomyoma of uterus, unspecified: Secondary | ICD-10-CM

## 2016-01-23 DIAGNOSIS — Z8041 Family history of malignant neoplasm of ovary: Secondary | ICD-10-CM

## 2016-01-23 DIAGNOSIS — R102 Pelvic and perineal pain: Secondary | ICD-10-CM

## 2016-01-23 DIAGNOSIS — Z842 Family history of other diseases of the genitourinary system: Secondary | ICD-10-CM

## 2016-01-23 DIAGNOSIS — N946 Dysmenorrhea, unspecified: Secondary | ICD-10-CM

## 2016-01-23 DIAGNOSIS — Z01818 Encounter for other preprocedural examination: Secondary | ICD-10-CM

## 2016-01-23 DIAGNOSIS — Z9889 Other specified postprocedural states: Secondary | ICD-10-CM

## 2016-01-23 DIAGNOSIS — I10 Essential (primary) hypertension: Secondary | ICD-10-CM | POA: Diagnosis not present

## 2016-01-23 DIAGNOSIS — Z0289 Encounter for other administrative examinations: Secondary | ICD-10-CM

## 2016-01-23 DIAGNOSIS — R140 Abdominal distension (gaseous): Secondary | ICD-10-CM

## 2016-01-23 HISTORY — DX: Gastro-esophageal reflux disease without esophagitis: K21.9

## 2016-01-23 LAB — BASIC METABOLIC PANEL
Anion gap: 7 (ref 5–15)
BUN: 16 mg/dL (ref 6–20)
CHLORIDE: 105 mmol/L (ref 101–111)
CO2: 25 mmol/L (ref 22–32)
Calcium: 9.4 mg/dL (ref 8.9–10.3)
Creatinine, Ser: 0.45 mg/dL (ref 0.44–1.00)
GFR calc Af Amer: >60 mL/min (ref >60)
GFR calc non Af Amer: >60 mL/min (ref >60)
Glucose, Bld: 88 mg/dL (ref 65–99)
POTASSIUM: 3.7 mmol/L (ref 3.5–5.1)
SODIUM: 137 mmol/L (ref 135–145)

## 2016-01-23 LAB — CBC WITH DIFFERENTIAL/PLATELET
Basophils Absolute: 0.1 10*3/uL (ref 0–0.1)
Basophils Relative: 1 %
EOS PCT: 1 %
Eosinophils Absolute: 0.1 10*3/uL (ref 0–0.7)
HCT: 41.4 % (ref 35.0–47.0)
HEMOGLOBIN: 14.2 g/dL (ref 12.0–16.0)
LYMPHS ABS: 2.6 10*3/uL (ref 1.0–3.6)
LYMPHS PCT: 24 %
MCH: 30.3 pg (ref 26.0–34.0)
MCHC: 34.3 g/dL (ref 32.0–36.0)
MCV: 88.4 fL (ref 80.0–100.0)
Monocytes Absolute: 0.8 10*3/uL (ref 0.2–0.9)
Monocytes Relative: 8 %
NEUTROS PCT: 66 %
Neutro Abs: 7.3 10*3/uL — ABNORMAL HIGH (ref 1.4–6.5)
Platelets: 355 10*3/uL (ref 150–440)
RBC: 4.68 MIL/uL (ref 3.80–5.20)
RDW: 14.1 % (ref 11.5–14.5)
WBC: 10.9 10*3/uL (ref 3.6–11.0)

## 2016-01-23 LAB — RAPID HIV SCREEN (HIV 1/2 AB+AG)
HIV 1/2 Antibodies: NONREACTIVE
HIV-1 P24 Antigen - HIV24: NONREACTIVE

## 2016-01-23 LAB — TYPE AND SCREEN
ABO/RH(D): O POS
ANTIBODY SCREEN: NEGATIVE

## 2016-01-23 NOTE — Patient Instructions (Signed)
1. Return in 2 weeks after surgery for postop check

## 2016-01-23 NOTE — Patient Instructions (Signed)
  Your procedure is scheduled on: 01-28-16 [facility]) Report to Same Day Surgery 2nd floor medical mall [facility] Entrance-take elevator on left to 2nd floor.  Check in with surgery information desk.) To find out your arrival time please call [phone] between 1PM - 3PM on 01-25-16 (FRIDAY)  Remember: Instructions that are not followed completely may result in serious medical risk, up to and including death, or upon the discretion of your surgeon and anesthesiologist your surgery may need to be rescheduled.    _x___ 1. Do not eat food or drink liquids after midnight. No gum chewing or hard candies.     __x__ 2. No Alcohol for 24 hours before or after surgery.   __x__3. No Smoking for 24 prior to surgery.   ____  4. Bring all medications with you on the day of surgery if instructed.    __x__ 5. Notify your doctor if there is any change in your medical condition     (cold, fever, infections).     Do not wear jewelry, make-up, hairpins, clips or nail polish.  Do not wear lotions, powders, or perfumes. You may wear deodorant.  Do not shave 48 hours prior to surgery. Men may shave face and neck.  Do not bring valuables to the hospital.    [facility] is not responsible for any belongings or valuables.               Contacts, dentures or bridgework may not be worn into surgery.  Leave your suitcase in the car. After surgery it may be brought to your room.  For patients admitted to the hospital, discharge time is determined by your treatment team.   Patients discharged the day of surgery will not be allowed to drive home.  You will need someone to drive you home and stay with you the night of your procedure.    Please read over the following fact sheets that you were given:   [facility] Preparing for Surgery and or MRSA Information   _x___ Take these medicines the morning of surgery with A SIP OF WATER:    1. AMLODIPINE  2. PROZAC  3. METOPROLOL  4. PRILOSEC  5. TAKE A PRILOSEC  Sunday NIGHT BEFORE BED (01-27-16)  6.  ____Fleets enema or Magnesium Citrate as directed.   _x___ Use CHG Soap or sage wipes as directed on instruction sheet   ____ Use inhalers on the day of surgery and bring to hospital day of surgery  ____ Stop metformin 2 days prior to surgery    ____ Take 1/2 of usual insulin dose the night before surgery and none on the morning of surgery.   ____ Stop Aspirin, Coumadin, Pllavix ,Eliquis, Effient, or Pradaxa  x__ Stop Anti-inflammatories such as Advil, Aleve, Ibuprofen, Motrin, Naproxen,          Naprosyn, Goodies powders or aspirin products-STOP LODINE AND NAPROXEN NOW-Ok to take Tylenol.   ____ Stop supplements until after surgery.    ____ Bring C-Pap to the hospital.

## 2016-01-23 NOTE — H&P (Signed)
Subjective:  PREOPERATIVE HISTORY AND PHYSICAL    Date of surgery: 01/28/2016 Procedure: LAVH BSO   Patient is a 55 y.o. G2P2016female scheduled for LAVH BSO on 01/28/2016 for management of chronic pelvic pain, family history of ovarian cancer, and family history of endometriosis. She is status post uterine artery embolization for fibroids. She is status post laparoscopy with peritoneal biopsies which did NOT demonstrate pathologic confirmation of endometriosis:  DIAGNOSIS:  A. LEFT UTEROSACRAL LIGAMENT; BIOPSY:  - FIBROADIPOSE TISSUE.   B. LOWER LEFT UTERINE SEGMENT NODULE; BIOPSY:  - DENSE FIBROMUSCULAR TISSUE WITH FEATURES OF A LEIOMYOMA.  - NEGATIVE FOR ATYPIA, INCREASED MITOSES AND NECROSIS.   C. LOWER RIGHT UTERINE SEGMENT NODULE; BIOPSY:  - FEATURES OF A LEIOMYOMA.  - NEGATIVE FOR ATYPIA, INCREASED MITOSES AND NECROSIS.   D. YELLOW LESION, LEFT FALLOPIAN TUBE; BIOPSY:  - PARATUBAL CYSTS.  Findings at the time of laparoscopy included small multiple fibroids, scarring of uterosacral ligament.  Patient now desires definitive surgery for her chronic pelvic pain and associated comorbidities.    Pertinent Gynecological History: Menarche: Age 63 Intervals: Regular on a monthly basis Duration: 3 days Dysmenorrhea: Mild currently; past history of moderate dysmenorrhea which did cause her to miss school and work in the past on an infrequent basis. History of uterine fibroids. Status post uterine artery embolization in 2010 because of menorrhagia; post procedure bleeding normalized History of previous D&C 2 because of menorrhagia without hyperplasia or carcinoma findings Family history of ovarian cancer in maternal aunt; no genetic testing was performed. Family history of breast cancer ina different maternal aunt Family history of endometriosis in daughter who recently underwent hysterectomy   Menstrual History: OB History    Gravida Para Term Preterm AB Living   2 2 2     2     SAB TAB Ectopic Multiple Live Births           2      Menarche age:NA No LMP recorded. Patient has had an ablation.    Past Medical History:  Diagnosis Date  . Anxiety   . Arthritis    poss ra  . Heart murmur   . Hypertension   . Vitamin D deficiency     Past Surgical History:  Procedure Laterality Date  . BREAST BIOPSY Left    stereo- neg  . BREAST CYST ASPIRATION Right    neg  . BREAST SURGERY     benign biopsy  . CHOLECYSTECTOMY    . DILATION AND CURETTAGE OF UTERUS    . LAPAROSCOPIC LYSIS OF ADHESIONS  12/10/2015   Procedure: LAPAROSCOPIC LYSIS OF ADHESIONS;  Surgeon: Brayton Mars, MD;  Location: [facility];  Service: Gynecology;;  . LAPAROSCOPY N/A 12/10/2015   Procedure: LAPAROSCOPY DIAGNOSTIC WITH BIOPSIES;  Surgeon: Brayton Mars, MD;  Location: [facility];  Service: Gynecology;  Laterality: N/A;  . SHOULDER ARTHROSCOPY Right   . TUBAL LIGATION    . UTERINE FIBROID EMBOLIZATION      OB History  Gravida Para Term Preterm AB Living  2 2 2     2   SAB TAB Ectopic Multiple Live Births          2    # Outcome Date GA Lbr Len/2nd Weight Sex Delivery Anes PTL Lv  2 Term 1988   7 lb 9.6 oz (3.447 kg) F Vag-Spont   LIV  1 Term 1986   7 lb 9.6 oz (3.447 kg) M Vag-Spont   LIV      Social  History   Social History  . Marital status: Married    Spouse name: N/A  . Number of children: N/A  . Years of education: N/A   Social History Main Topics  . Smoking status: Current Every Day Smoker    Packs/day: 0.50    Types: Cigarettes  . Smokeless tobacco: Never Used  . Alcohol use 0.0 oz/week     Comment: Occasional  . Drug use: No  . Sexual activity: Yes    Birth control/ protection: Surgical     Comment: occasional   Other Topics Concern  . None   Social History Narrative  . None    Family History  Problem Relation Age of Onset  . Cancer Mother     cervical, lung  . Heart disease Father   . Hypertension Father   . Breast cancer  Maternal Aunt   . Ovarian cancer Maternal Aunt   . Diabetes Neg Hx      (Not in a hospital admission)  Allergies  Allergen Reactions  . Hydroxychloroquine Itching    Pt take zyrtec to alleviate symptoms   . Penicillin G Hives    Has patient had a PCN reaction causing immediate rash, facial/tongue/throat swelling, SOB or lightheadedness with hypotension:unsure Has patient had a PCN reaction causing severe rash involving mucus membranes or skin necrosis:unsure Has patient had a PCN reaction that required hospitalization:unsure Has patient had a PCN reaction occurring within the last 10 years:No If all of the above answers are "NO", then may proceed with Cephalosporin use.     Review of Systems Constitutional: No recent fever/chills/sweats Respiratory: No recent cough/bronchitis Cardiovascular: No chest pain Gastrointestinal: No recent nausea/vomiting/diarrhea Genitourinary: No UTI symptoms Hematologic/lymphatic:No history of coagulopathy or recent blood thinner use    Objective:    BP (!) 152/80   Pulse 64   Ht 5\' 2"  (1.575 m)   Wt 188 lb 1.6 oz (85.3 kg)   BMI 34.40 kg/m   General:   Normal  Skin:   normal  HEENT:  Normal  Neck:  Supple without Adenopathy or Thyromegaly  Lungs:   Heart:              Breasts:   Abdomen:  Pelvis:  M/S   Extremeties:  Neuro:    clear to auscultation bilaterally   Normal without murmur   Not Examined   soft, non-tender; bowel sounds normal; no masses,  no organomegaly   Exam deferred to OR  No CVAT  Warm/Dry   Normal       11/13/2015 PELVIC: External Genitalia: Normal BUS: Normal Vagina: Normal Cervix: Parous; no lesions; mild cervical motion tenderness Uterus: Enlarged 12 weeksize, irregular shape, normal consistency, mobile, slightly tender with left-sided prominence Adnexa: Normal; nonpalpable nontender RV: Normal external  exam Bladder: Nontender  Assessment:    1. Chronic pelvic pain 2. Abdominal bloating 3. Family history of ovarian cancer 4. Family history of endometriosis 5. Uterine fibroids 6. Dense post uterine artery embolization 7. Abnormal uterine bleeding 8. Status post recent laparoscopy with pathology not conclusively demonstrating endometriosis; patient desires definitive surgery    Plan:  LAVH BSO   Preop counseling: Patient is to undergo LAVH BSO for chronic pelvic pain thought to be secondary to symptomatic endometriosis as well as family history of ovarian cancer. She is understanding of the planned procedure and is aware of and is accepting of all surgical risks which include but are not limited to bleeding, infection, pelvic organ injury with need for repair,  blood clot disorders, anesthesia risks, etc. All questions have been answered. Informed consent is given. Patient is ready and willing to proceed with surgery as scheduled.  Brayton Mars, MD  Note: This dictation was prepared with Dragon dictation along with smaller phrase technology. Any transcriptional errors that result from this process are unintentional.

## 2016-01-23 NOTE — Progress Notes (Signed)
Subjective:  PREOPERATIVE HISTORY AND PHYSICAL    Date of surgery: 01/28/2016 Procedure: LAVH BSO   Patient is a 55 y.o. G2P2090female scheduled for LAVH BSO on 01/28/2016 for management of chronic pelvic pain, family history of ovarian cancer, and family history of endometriosis. She is status post uterine artery embolization for fibroids. She is status post laparoscopy with peritoneal biopsies which did NOT demonstrate pathologic confirmation of endometriosis:  DIAGNOSIS:  A. LEFT UTEROSACRAL LIGAMENT; BIOPSY:  - FIBROADIPOSE TISSUE.   B. LOWER LEFT UTERINE SEGMENT NODULE; BIOPSY:  - DENSE FIBROMUSCULAR TISSUE WITH FEATURES OF A LEIOMYOMA.  - NEGATIVE FOR ATYPIA, INCREASED MITOSES AND NECROSIS.   C. LOWER RIGHT UTERINE SEGMENT NODULE; BIOPSY:  - FEATURES OF A LEIOMYOMA.  - NEGATIVE FOR ATYPIA, INCREASED MITOSES AND NECROSIS.   D. YELLOW LESION, LEFT FALLOPIAN TUBE; BIOPSY:  - PARATUBAL CYSTS.  Findings at the time of laparoscopy included small multiple fibroids, scarring of uterosacral ligament.  Patient now desires definitive surgery for her chronic pelvic pain and associated comorbidities.    Pertinent Gynecological History: Menarche: Age 73 Intervals: Regular on a monthly basis Duration: 3 days Dysmenorrhea: Mild currently; past history of moderate dysmenorrhea which did cause her to miss school and work in the past on an infrequent basis. History of uterine fibroids. Status post uterine artery embolization in 2010 because of menorrhagia; post procedure bleeding normalized History of previous D&C 2 because of menorrhagia without hyperplasia or carcinoma findings Family history of ovarian cancer in maternal aunt; no genetic testing was performed. Family history of breast cancer ina different maternal aunt Family history of endometriosis in daughter who recently underwent hysterectomy   Menstrual History: OB History    Gravida Para Term Preterm AB Living   2 2 2     2     SAB TAB Ectopic Multiple Live Births           2      Menarche age:NA No LMP recorded. Patient has had an ablation.    Past Medical History:  Diagnosis Date  . Anxiety   . Arthritis    poss ra  . Heart murmur   . Hypertension   . Vitamin D deficiency     Past Surgical History:  Procedure Laterality Date  . BREAST BIOPSY Left    stereo- neg  . BREAST CYST ASPIRATION Right    neg  . BREAST SURGERY     benign biopsy  . CHOLECYSTECTOMY    . DILATION AND CURETTAGE OF UTERUS    . LAPAROSCOPIC LYSIS OF ADHESIONS  12/10/2015   Procedure: LAPAROSCOPIC LYSIS OF ADHESIONS;  Surgeon: Brayton Mars, MD;  Location: [facility];  Service: Gynecology;;  . LAPAROSCOPY N/A 12/10/2015   Procedure: LAPAROSCOPY DIAGNOSTIC WITH BIOPSIES;  Surgeon: Brayton Mars, MD;  Location: [facility];  Service: Gynecology;  Laterality: N/A;  . SHOULDER ARTHROSCOPY Right   . TUBAL LIGATION    . UTERINE FIBROID EMBOLIZATION      OB History  Gravida Para Term Preterm AB Living  2 2 2     2   SAB TAB Ectopic Multiple Live Births          2    # Outcome Date GA Lbr Len/2nd Weight Sex Delivery Anes PTL Lv  2 Term 1988   7 lb 9.6 oz (3.447 kg) F Vag-Spont   LIV  1 Term 1986   7 lb 9.6 oz (3.447 kg) M Vag-Spont   LIV      Social  History   Social History  . Marital status: Married    Spouse name: N/A  . Number of children: N/A  . Years of education: N/A   Social History Main Topics  . Smoking status: Current Every Day Smoker    Packs/day: 0.50    Types: Cigarettes  . Smokeless tobacco: Never Used  . Alcohol use 0.0 oz/week     Comment: Occasional  . Drug use: No  . Sexual activity: Yes    Birth control/ protection: Surgical     Comment: occasional   Other Topics Concern  . None   Social History Narrative  . None    Family History  Problem Relation Age of Onset  . Cancer Mother     cervical, lung  . Heart disease Father   . Hypertension Father   . Breast cancer  Maternal Aunt   . Ovarian cancer Maternal Aunt   . Diabetes Neg Hx      (Not in a hospital admission)  Allergies  Allergen Reactions  . Hydroxychloroquine Itching    Pt take zyrtec to alleviate symptoms   . Penicillin G Hives    Has patient had a PCN reaction causing immediate rash, facial/tongue/throat swelling, SOB or lightheadedness with hypotension:unsure Has patient had a PCN reaction causing severe rash involving mucus membranes or skin necrosis:unsure Has patient had a PCN reaction that required hospitalization:unsure Has patient had a PCN reaction occurring within the last 10 years:No If all of the above answers are "NO", then may proceed with Cephalosporin use.     Review of Systems Constitutional: No recent fever/chills/sweats Respiratory: No recent cough/bronchitis Cardiovascular: No chest pain Gastrointestinal: No recent nausea/vomiting/diarrhea Genitourinary: No UTI symptoms Hematologic/lymphatic:No history of coagulopathy or recent blood thinner use    Objective:    BP (!) 152/80   Pulse 64   Ht 5\' 2"  (1.575 m)   Wt 188 lb 1.6 oz (85.3 kg)   BMI 34.40 kg/m   General:   Normal  Skin:   normal  HEENT:  Normal  Neck:  Supple without Adenopathy or Thyromegaly  Lungs:   Heart:              Breasts:   Abdomen:  Pelvis:  M/S   Extremeties:  Neuro:    clear to auscultation bilaterally   Normal without murmur   Not Examined   soft, non-tender; bowel sounds normal; no masses,  no organomegaly   Exam deferred to OR  No CVAT  Warm/Dry   Normal       11/13/2015 PELVIC: External Genitalia: Normal BUS: Normal Vagina: Normal Cervix: Parous; no lesions; mild cervical motion tenderness Uterus: Enlarged 12 weeksize, irregular shape, normal consistency, mobile, slightly tender with left-sided prominence Adnexa: Normal; nonpalpable nontender RV: Normal external  exam Bladder: Nontender  Assessment:    1. Chronic pelvic pain 2. Abdominal bloating 3. Family history of ovarian cancer 4. Family history of endometriosis 5. Uterine fibroids 6. Dense post uterine artery embolization 7. Abnormal uterine bleeding 8. Status post recent laparoscopy with pathology not conclusively demonstrating endometriosis; patient desires definitive surgery    Plan:  LAVH BSO   Preop counseling: Patient is to undergo LAVH BSO for chronic pelvic pain thought to be secondary to symptomatic endometriosis as well as family history of ovarian cancer. She is understanding of the planned procedure and is aware of and is accepting of all surgical risks which include but are not limited to bleeding, infection, pelvic organ injury with need for repair,  blood clot disorders, anesthesia risks, etc. All questions have been answered. Informed consent is given. Patient is ready and willing to proceed with surgery as scheduled.  Brayton Mars, MD  Note: This dictation was prepared with Dragon dictation along with smaller phrase technology. Any transcriptional errors that result from this process are unintentional.

## 2016-01-24 ENCOUNTER — Telehealth: Payer: Self-pay | Admitting: Family Medicine

## 2016-01-24 LAB — RPR: RPR Ser Ql: NONREACTIVE

## 2016-01-24 NOTE — Telephone Encounter (Signed)
PT IS NEEDING REFILL ON HER BLOOD PRESSURE. SHE SAID THAT SHE IS HAVING SURGERY ON Monday AND SHE IS OUT AS OF TODAY. [facility] is [facility] [address] ( not the one in [place]

## 2016-01-25 ENCOUNTER — Other Ambulatory Visit: Payer: Self-pay

## 2016-01-25 DIAGNOSIS — I10 Essential (primary) hypertension: Secondary | ICD-10-CM

## 2016-01-25 MED ORDER — AMLODIPINE BESYLATE 10 MG PO TABS: 10.0000 mg | ORAL_TABLET | Freq: Every day | ORAL | 0 refills | Status: DC

## 2016-01-28 ENCOUNTER — Encounter
Admission: RE | Disposition: A | Discharge status: Home / Self Care | Payer: Self-pay | Source: Ambulatory Visit | Attending: Obstetrics and Gynecology

## 2016-01-28 ENCOUNTER — Ambulatory Visit: Payer: Managed Care, Other (non HMO) | Admitting: Anesthesiology

## 2016-01-28 ENCOUNTER — Observation Stay
Admission: RE | Admit: 2016-01-28 | Discharge: 2016-01-29 | Disposition: A | Discharge status: Home / Self Care | Payer: Managed Care, Other (non HMO) | Source: Ambulatory Visit | Attending: Obstetrics and Gynecology | Admitting: Obstetrics and Gynecology

## 2016-01-28 DIAGNOSIS — I10 Essential (primary) hypertension: Secondary | ICD-10-CM | POA: Insufficient documentation

## 2016-01-28 DIAGNOSIS — D270 Benign neoplasm of right ovary: Secondary | ICD-10-CM | POA: Insufficient documentation

## 2016-01-28 DIAGNOSIS — K219 Gastro-esophageal reflux disease without esophagitis: Secondary | ICD-10-CM | POA: Diagnosis not present

## 2016-01-28 DIAGNOSIS — Z6834 Body mass index (BMI) 34.0-34.9, adult: Secondary | ICD-10-CM | POA: Insufficient documentation

## 2016-01-28 DIAGNOSIS — M1990 Unspecified osteoarthritis, unspecified site: Secondary | ICD-10-CM | POA: Diagnosis not present

## 2016-01-28 DIAGNOSIS — D251 Intramural leiomyoma of uterus: Secondary | ICD-10-CM | POA: Diagnosis not present

## 2016-01-28 DIAGNOSIS — R102 Pelvic and perineal pain: Secondary | ICD-10-CM | POA: Insufficient documentation

## 2016-01-28 DIAGNOSIS — D271 Benign neoplasm of left ovary: Secondary | ICD-10-CM | POA: Insufficient documentation

## 2016-01-28 DIAGNOSIS — Z880 Allergy status to penicillin: Secondary | ICD-10-CM | POA: Diagnosis not present

## 2016-01-28 DIAGNOSIS — N800 Endometriosis of uterus: Secondary | ICD-10-CM | POA: Insufficient documentation

## 2016-01-28 DIAGNOSIS — E669 Obesity, unspecified: Secondary | ICD-10-CM | POA: Insufficient documentation

## 2016-01-28 DIAGNOSIS — F17210 Nicotine dependence, cigarettes, uncomplicated: Secondary | ICD-10-CM | POA: Insufficient documentation

## 2016-01-28 DIAGNOSIS — N949 Unspecified condition associated with female genital organs and menstrual cycle: Secondary | ICD-10-CM

## 2016-01-28 DIAGNOSIS — Z8041 Family history of malignant neoplasm of ovary: Secondary | ICD-10-CM | POA: Diagnosis not present

## 2016-01-28 DIAGNOSIS — Z90710 Acquired absence of both cervix and uterus: Secondary | ICD-10-CM

## 2016-01-28 DIAGNOSIS — F419 Anxiety disorder, unspecified: Secondary | ICD-10-CM | POA: Insufficient documentation

## 2016-01-28 DIAGNOSIS — Z888 Allergy status to other drugs, medicaments and biological substances status: Secondary | ICD-10-CM | POA: Diagnosis not present

## 2016-01-28 DIAGNOSIS — N72 Inflammatory disease of cervix uteri: Secondary | ICD-10-CM | POA: Insufficient documentation

## 2016-01-28 DIAGNOSIS — N838 Other noninflammatory disorders of ovary, fallopian tube and broad ligament: Secondary | ICD-10-CM | POA: Insufficient documentation

## 2016-01-28 DIAGNOSIS — G8929 Other chronic pain: Principal | ICD-10-CM | POA: Insufficient documentation

## 2016-01-28 DIAGNOSIS — N809 Endometriosis, unspecified: Secondary | ICD-10-CM

## 2016-01-28 HISTORY — PX: LAPAROSCOPIC VAGINAL HYSTERECTOMY WITH SALPINGO OOPHORECTOMY: SHX6681

## 2016-01-28 LAB — ABO/RH: ABO/RH(D): O POS

## 2016-01-28 LAB — POCT PREGNANCY, URINE: PREG TEST UR: NEGATIVE

## 2016-01-28 SURGERY — HYSTERECTOMY, VAGINAL, LAPAROSCOPY-ASSISTED, WITH SALPINGO-OOPHORECTOMY
Anesthesia: General | Laterality: Bilateral

## 2016-01-28 MED ORDER — DOCUSATE SODIUM 100 MG PO CAPS
100.0000 mg | ORAL_CAPSULE | Freq: Two times a day (BID) | ORAL | Status: DC
  Administered 2016-01-28 – 2016-01-29 (×3): 100 mg via ORAL
  Filled 2016-01-28 (×3): qty 1

## 2016-01-28 MED ORDER — ONDANSETRON HCL 4 MG/2ML IJ SOLN
INTRAMUSCULAR | Status: DC | PRN
  Administered 2016-01-28: 4 mg via INTRAVENOUS

## 2016-01-28 MED ORDER — ROCURONIUM BROMIDE 100 MG/10ML IV SOLN
INTRAVENOUS | Status: DC | PRN
  Administered 2016-01-28: 50 mg via INTRAVENOUS

## 2016-01-28 MED ORDER — PANTOPRAZOLE SODIUM 40 MG PO TBEC
40.0000 mg | DELAYED_RELEASE_TABLET | Freq: Every day | ORAL | Status: DC
  Administered 2016-01-29: 40 mg via ORAL
  Filled 2016-01-28: qty 1

## 2016-01-28 MED ORDER — FENTANYL CITRATE (PF) 100 MCG/2ML IJ SOLN
INTRAMUSCULAR | Status: DC | PRN
  Administered 2016-01-28 (×4): 50 ug via INTRAVENOUS
  Administered 2016-01-28: 100 ug via INTRAVENOUS

## 2016-01-28 MED ORDER — PROPOFOL 10 MG/ML IV BOLUS
INTRAVENOUS | Status: DC | PRN
  Administered 2016-01-28: 50 mg via INTRAVENOUS
  Administered 2016-01-28: 150 mg via INTRAVENOUS

## 2016-01-28 MED ORDER — HYDROCHLOROTHIAZIDE 25 MG PO TABS
12.5000 mg | ORAL_TABLET | Freq: Every day | ORAL | Status: DC
Start: 2016-01-28 — End: 2016-01-29
  Administered 2016-01-29: 12.5 mg via ORAL
  Filled 2016-01-28 (×3): qty 0.5

## 2016-01-28 MED ORDER — CLINDAMYCIN PHOSPHATE 600 MG/50ML IV SOLN
INTRAVENOUS | Status: DC | PRN
  Administered 2016-01-28: 900 mg via INTRAVENOUS

## 2016-01-28 MED ORDER — HYDROXYCHLOROQUINE SULFATE 200 MG PO TABS
200.0000 mg | ORAL_TABLET | Freq: Two times a day (BID) | ORAL | Status: DC
  Administered 2016-01-28 (×2): 200 mg via ORAL
  Filled 2016-01-28 (×2): qty 1

## 2016-01-28 MED ORDER — SIMETHICONE 80 MG PO CHEW
80.0000 mg | CHEWABLE_TABLET | Freq: Four times a day (QID) | ORAL | Status: DC | PRN
  Administered 2016-01-28: 80 mg via ORAL
  Filled 2016-01-28: qty 1

## 2016-01-28 MED ORDER — GENTAMICIN SULFATE 40 MG/ML IJ SOLN
INTRAVENOUS | Status: AC
  Administered 2016-01-28: 430 mL via INTRAVENOUS
  Filled 2016-01-28: qty 10.75

## 2016-01-28 MED ORDER — FENTANYL CITRATE (PF) 100 MCG/2ML IJ SOLN
INTRAMUSCULAR | Status: AC
  Administered 2016-01-28: 25 ug via INTRAVENOUS
  Filled 2016-01-28: qty 2

## 2016-01-28 MED ORDER — PHENYLEPHRINE HCL 10 MG/ML IJ SOLN
INTRAMUSCULAR | Status: DC | PRN
  Administered 2016-01-28 (×12): 200 ug via INTRAVENOUS

## 2016-01-28 MED ORDER — LACTATED RINGERS IV SOLN: INTRAVENOUS | Status: DC

## 2016-01-28 MED ORDER — BUPIVACAINE HCL (PF) 0.5 % IJ SOLN
INTRAMUSCULAR | Status: DC | PRN
  Administered 2016-01-28: 10 mL

## 2016-01-28 MED ORDER — LIDOCAINE HCL (CARDIAC) 20 MG/ML IV SOLN
INTRAVENOUS | Status: DC | PRN
  Administered 2016-01-28: 100 mg via INTRAVENOUS

## 2016-01-28 MED ORDER — KETOROLAC TROMETHAMINE 30 MG/ML IJ SOLN
30.0000 mg | Freq: Four times a day (QID) | INTRAMUSCULAR | Status: DC
  Administered 2016-01-28 – 2016-01-29 (×5): 30 mg via INTRAVENOUS
  Filled 2016-01-28 (×5): qty 1

## 2016-01-28 MED ORDER — GLYCOPYRROLATE 0.2 MG/ML IJ SOLN
INTRAMUSCULAR | Status: DC | PRN
  Administered 2016-01-28: 0.6 mg via INTRAVENOUS

## 2016-01-28 MED ORDER — CLINDAMYCIN PHOSPHATE 900 MG/50ML IV SOLN
INTRAVENOUS | Status: AC
  Administered 2016-01-28: 900 mg
  Filled 2016-01-28: qty 50

## 2016-01-28 MED ORDER — MORPHINE SULFATE (PF) 2 MG/ML IV SOLN
1.0000 mg | INTRAVENOUS | Status: DC | PRN
  Administered 2016-01-28 (×2): 2 mg via INTRAVENOUS
  Filled 2016-01-28 (×2): qty 1

## 2016-01-28 MED ORDER — LACTATED RINGERS IV SOLN
INTRAVENOUS | Status: DC | PRN
  Administered 2016-01-28 (×2): via INTRAVENOUS

## 2016-01-28 MED ORDER — NEOSTIGMINE METHYLSULFATE 10 MG/10ML IV SOLN
INTRAVENOUS | Status: DC | PRN
  Administered 2016-01-28: 5 mg via INTRAVENOUS

## 2016-01-28 MED ORDER — KETOROLAC TROMETHAMINE 30 MG/ML IJ SOLN: 30.0000 mg | Freq: Four times a day (QID) | INTRAMUSCULAR | Status: DC

## 2016-01-28 MED ORDER — FENTANYL CITRATE (PF) 100 MCG/2ML IJ SOLN
25.0000 ug | INTRAMUSCULAR | Status: AC | PRN
  Administered 2016-01-28 (×6): 25 ug via INTRAVENOUS

## 2016-01-28 MED ORDER — MIDAZOLAM HCL 2 MG/2ML IJ SOLN
INTRAMUSCULAR | Status: DC | PRN
  Administered 2016-01-28: 2 mg via INTRAVENOUS

## 2016-01-28 MED ORDER — BISACODYL 10 MG RE SUPP: 10.0000 mg | Freq: Every day | RECTAL | Status: DC | PRN

## 2016-01-28 MED ORDER — FLUOXETINE HCL 20 MG PO CAPS
40.0000 mg | ORAL_CAPSULE | Freq: Every day | ORAL | Status: DC
  Administered 2016-01-29: 40 mg via ORAL
  Filled 2016-01-28 (×3): qty 2

## 2016-01-28 MED ORDER — HYDROMORPHONE HCL 1 MG/ML IJ SOLN
0.2500 mg | INTRAMUSCULAR | Status: DC | PRN
  Administered 2016-01-28 (×4): 0.25 mg via INTRAVENOUS

## 2016-01-28 MED ORDER — LACTATED RINGERS IV SOLN
INTRAVENOUS | Status: DC
  Administered 2016-01-28: 1000 mL via INTRAVENOUS

## 2016-01-28 MED ORDER — DEXAMETHASONE SODIUM PHOSPHATE 10 MG/ML IJ SOLN
INTRAMUSCULAR | Status: DC | PRN
  Administered 2016-01-28: 4 mg via INTRAVENOUS

## 2016-01-28 MED ORDER — OXYCODONE-ACETAMINOPHEN 5-325 MG PO TABS
1.0000 | ORAL_TABLET | ORAL | Status: DC | PRN
  Administered 2016-01-28 – 2016-01-29 (×4): 2 via ORAL
  Filled 2016-01-28 (×2): qty 2
  Filled 2016-01-28 (×2): qty 1
  Filled 2016-01-28: qty 2

## 2016-01-28 MED ORDER — AMLODIPINE BESYLATE 10 MG PO TABS
10.0000 mg | ORAL_TABLET | Freq: Every day | ORAL | Status: DC
  Administered 2016-01-29: 10 mg via ORAL
  Filled 2016-01-28: qty 1

## 2016-01-28 MED ORDER — ONDANSETRON HCL 4 MG/2ML IJ SOLN: 4.0000 mg | Freq: Once | INTRAMUSCULAR | Status: DC | PRN

## 2016-01-28 MED ORDER — SUCCINYLCHOLINE CHLORIDE 20 MG/ML IJ SOLN
INTRAMUSCULAR | Status: DC | PRN
  Administered 2016-01-28: 100 mg via INTRAVENOUS

## 2016-01-28 MED ORDER — BUPIVACAINE HCL (PF) 0.5 % IJ SOLN
INTRAMUSCULAR | Status: AC
  Filled 2016-01-28: qty 30

## 2016-01-28 MED ORDER — LACTATED RINGERS IV SOLN
INTRAVENOUS | Status: DC
  Administered 2016-01-28 – 2016-01-29 (×3): via INTRAVENOUS

## 2016-01-28 MED ORDER — ACETAMINOPHEN 325 MG PO TABS: 650.0000 mg | ORAL_TABLET | ORAL | Status: DC | PRN

## 2016-01-28 MED ORDER — HYDROMORPHONE HCL 1 MG/ML IJ SOLN
INTRAMUSCULAR | Status: AC
  Administered 2016-01-28: 0.25 mg via INTRAVENOUS
  Filled 2016-01-28: qty 1

## 2016-01-28 MED ORDER — METOPROLOL SUCCINATE ER 50 MG PO TB24
50.0000 mg | ORAL_TABLET | Freq: Every day | ORAL | Status: DC
  Administered 2016-01-29: 50 mg via ORAL
  Filled 2016-01-28: qty 1

## 2016-01-28 MED ORDER — LORATADINE 10 MG PO TABS
10.0000 mg | ORAL_TABLET | Freq: Every day | ORAL | Status: DC
  Administered 2016-01-28 – 2016-01-29 (×2): 10 mg via ORAL
  Filled 2016-01-28 (×2): qty 1

## 2016-01-28 SURGICAL SUPPLY — 50 items
ANCHOR TIS RET SYS 235ML (MISCELLANEOUS) ×3 IMPLANT
BAG URO DRAIN 2000ML W/SPOUT (MISCELLANEOUS) ×3 IMPLANT
BLADE SURG SZ11 CARB STEEL (BLADE) ×3 IMPLANT
CATH FOLEY 2WAY  5CC 16FR (CATHETERS) ×2
CATH URTH 16FR FL 2W BLN LF (CATHETERS) ×1 IMPLANT
CHLORAPREP W/TINT 26ML (MISCELLANEOUS) ×3 IMPLANT
CORD MONOPOLAR M/FML 12FT (MISCELLANEOUS) ×3 IMPLANT
DEVICE PMI PUNCTURE CLOSURE (MISCELLANEOUS) ×3 IMPLANT
DISSECTOR KITTNER STICK (MISCELLANEOUS) ×1 IMPLANT
DISSECTORS/KITTNER STICK (MISCELLANEOUS) ×3
DRSG TEGADERM 2X2.25 PEDS (GAUZE/BANDAGES/DRESSINGS) ×9 IMPLANT
ELECT REM PT RETURN 9FT ADLT (ELECTROSURGICAL) ×3
ELECTRODE REM PT RTRN 9FT ADLT (ELECTROSURGICAL) ×1 IMPLANT
GAUZE PETRO XEROFOAM 1X8 (MISCELLANEOUS) IMPLANT
GLOVE BIO SURGEON STRL SZ 6.5 (GLOVE) ×4 IMPLANT
GLOVE BIO SURGEON STRL SZ8 (GLOVE) ×30 IMPLANT
GLOVE BIO SURGEONS STRL SZ 6.5 (GLOVE) ×2
GLOVE INDICATOR 7.0 STRL GRN (GLOVE) ×3 IMPLANT
GOWN STRL REUS W/ TWL LRG LVL3 (GOWN DISPOSABLE) ×2 IMPLANT
GOWN STRL REUS W/ TWL XL LVL3 (GOWN DISPOSABLE) ×1 IMPLANT
GOWN STRL REUS W/TWL LRG LVL3 (GOWN DISPOSABLE) ×4
GOWN STRL REUS W/TWL XL LVL3 (GOWN DISPOSABLE) ×2
IRRIGATION STRYKERFLOW (MISCELLANEOUS) ×1 IMPLANT
IRRIGATOR STRYKERFLOW (MISCELLANEOUS) ×3
IV LACTATED RINGERS 1000ML (IV SOLUTION) ×3 IMPLANT
KIT PINK PAD W/HEAD ARE REST (MISCELLANEOUS) ×3
KIT PINK PAD W/HEAD ARM REST (MISCELLANEOUS) ×1 IMPLANT
KIT RM TURNOVER CYSTO AR (KITS) ×3 IMPLANT
LABEL OR SOLS (LABEL) ×3 IMPLANT
LIQUID BAND (GAUZE/BANDAGES/DRESSINGS) ×3 IMPLANT
PACK BASIN MINOR ARMC (MISCELLANEOUS) ×3 IMPLANT
PACK GYN LAPAROSCOPIC (MISCELLANEOUS) ×3 IMPLANT
PAD OB MATERNITY 4.3X12.25 (PERSONAL CARE ITEMS) ×3 IMPLANT
PENCIL ELECTRO HAND CTR (MISCELLANEOUS) ×3 IMPLANT
SCISSORS METZENBAUM CVD 33 (INSTRUMENTS) IMPLANT
SHEARS HARMONIC ACE PLUS 36CM (ENDOMECHANICALS) ×3 IMPLANT
SLEEVE ENDOPATH XCEL 5M (ENDOMECHANICALS) ×6 IMPLANT
SUT CHROMIC 2 0 CT 1 (SUTURE) IMPLANT
SUT MNCRL 4-0 (SUTURE) ×4
SUT MNCRL 4-0 27XMFL (SUTURE) ×2
SUT VIC AB 0 CT1 27 (SUTURE) ×2
SUT VIC AB 0 CT1 27XCR 8 STRN (SUTURE) ×1 IMPLANT
SUT VIC AB 0 CT1 36 (SUTURE) ×6 IMPLANT
SUT VIC AB 0 CT2 27 (SUTURE) ×3 IMPLANT
SUTURE MNCRL 4-0 27XMF (SUTURE) ×2 IMPLANT
SYRINGE 10CC LL (SYRINGE) ×3 IMPLANT
TAPE TRANSPORE STRL 2 31045 (GAUZE/BANDAGES/DRESSINGS) ×3 IMPLANT
TROCAR ENDO BLADELESS 11MM (ENDOMECHANICALS) ×3 IMPLANT
TROCAR XCEL NON-BLD 5MMX100MML (ENDOMECHANICALS) ×3 IMPLANT
TUBING INSUFFLATOR HEATED (MISCELLANEOUS) ×3 IMPLANT

## 2016-01-28 NOTE — Op Note (Signed)
OPERATIVE NOTE:  Nichole Wells PROCEDURE DATE: 01/28/2016   PREOPERATIVE DIAGNOSIS:  1. Chronic pelvic pain 2. Symptomatic endometriosis  POSTOPERATIVE DIAGNOSIS:  1. Chronic pelvic pain 2. Symptomatic endometriosis  PROCEDURE: LAVH BSO  SURGEON:  Brayton Mars, MD ASSISTANTS: Dr. Marcelline Mates ANESTHESIA: General INDICATIONS: 55 y.o. [id number] with chronic pelvic pain and suspected endometriosis, presents for definitive surgical management.  FINDINGS:  Bilateral ovarian cysts; lower uterine segment fibroid on the right side; normal-appearing appendix; ureters appeared normal with peristalsis post procedure   I/O's: Total I/O In: 1000 [I.V.:1000] Out: 100 [Blood:100] COUNTS:  YES SPECIMENS: Uterus with cervix, bilateral fallopian tubes and fibroid ANTIBIOTIC PROPHYLAXIS:Gentamicin and clindamycin COMPLICATIONS: None immediate  PROCEDURE IN DETAIL: Patient was brought to the operating room and placed in the supine position. General endotracheal anesthesia was induced without difficulty. She was placed in the dorsal lithotomy position using the bumblebee stirrups. A ChloraPrep and Hibiclens abdominal perineal intravaginal prep and drape was performed in standard fashion. Timeout was completed. Foley catheter placed and was draining clear yellow urine. A Hulka tenaculum was placed onto the cervix to facilitate uterine manipulation intraoperatively. A subumbilical vertical incision 5 mm in length was made. The Optiview laparoscopic trocar system was used with a 5 mm port to make a direct entry into the abdominal pelvic cavity without evidence of bowel or vascular injury. 2 other 5 mm ports were placed right and left lower quadrants respectively under direct visualization. Hysterectomy was then performed in standard fashion. The Ace Harmonic scalpel along with the use of graspers facilitated the procedure. The infundibulo pelvic ligaments were clamped coagulated and cut. The mesosalpinx was  likewise clamped coagulated and cut with the Ace Harmonic scalpel. The round ligaments were clamped coagulated and cut. The anterior posterior leafs of broad ligament were opened and dissected out to expose the right sided lower uterine segment fibroid.. The cardinal broad ligament complexes were taken down in standard fashion using a clamping coagulating and cutting technique with the Ace Harmonic scalpel. Once the uterine vessels were adequately skeletonized Kleppinger bipolar cautery was used to fulgurate these vessels. They were then transected. The bladder flap was created in the lower uterine segment through sharp and blunt dissection. A lower uterine segment fibroid was freed up and separately removed using a grasper and the Ace Harmonic scalpel. This was placed in the cul-de-sac for removal later. Once the cardinal broad ligament complexes were taken down to the level of the cervicovaginal junction, the procedure was then moved to the vaginal approach. Weighted speculum was placed in the vagina. A double-tooth tenaculum placed on the cervix to facilitate uterine manipulation. A posterior colpotomy was made with Mayo scissors. Uterosacral ligaments were clamped cut and stick tied using 0 Vicryl suture. The cervix was circumscribed with the Bovie cautery and the bladder and vagina was dissected off lower uterine segment in standard fashion. Sequentially the remainder of the cardinal ligament complexes were taken down and the specimen was removed from the operative field. The vagina was closed with 2-0 chromic sutures in a simple interrupted manner. Following closure of the vagina, repeat laparoscopy was performed. Good hemostasis was noted. The pelvis was irrigated with saline and the irrigant was aspirated. The fibroid 3 cm in diameter was then removed through an 11 mm port was placed through the right lower quadrant 5 mm port incision site. An Endo Catch bag was used to facilitate removal. The 11 mm port  site was then closed using a 0 Vicryl suture with the needle passer.  Good closure was obtained. Once satisfied with final inspection of the pelvis and abdomen," was removed. The pneumoperitoneum was released. The 5 mm incisions and the skin incisions millimeter ports using 4-0 Monocryl. Dermabond glue was placed over the incisions. Patient was then awakened and spent taking the recovery room in satisfactory condition.  Catalino Plascencia A. Zipporah Plants, MD, [facility]

## 2016-01-28 NOTE — Transfer of Care (Signed)
Immediate Anesthesia Transfer of Care Note  Patient: Nichole Wells  Procedure(s) Performed: Procedure(s): LAPAROSCOPIC ASSISTED VAGINAL HYSTERECTOMY WITH SALPINGO OOPHORECTOMY (Bilateral)  Patient Location: PACU  Anesthesia Type:General  Level of Consciousness: awake  Airway & Oxygen Therapy: Patient Spontanous Breathing  Post-op Assessment: Report given to RN  Post vital signs: stable  Last Vitals:  Vitals:   01/28/16 0615  BP: 133/63  Pulse: 65  Resp: 17  Temp: 36.8 C    Last Pain:  Vitals:   01/28/16 0615  TempSrc: Tympanic         Complications: No apparent anesthesia complications

## 2016-01-28 NOTE — Anesthesia Preprocedure Evaluation (Signed)
Anesthesia Evaluation  Patient identified by MRN, date of birth, ID band Patient awake    Reviewed: Allergy & Precautions, NPO status , Patient's Chart, lab work & pertinent test results, reviewed documented beta blocker date and time   Airway Mallampati: III  TM Distance: >3 FB     Dental  (+) Chipped   Pulmonary Current Smoker,           Cardiovascular hypertension, Pt. on medications and Pt. on home beta blockers      Neuro/Psych PSYCHIATRIC DISORDERS Anxiety    GI/Hepatic GERD  Controlled,  Endo/Other    Renal/GU      Musculoskeletal  (+) Arthritis ,   Abdominal   Peds  Hematology   Anesthesia Other Findings Obese.  Reproductive/Obstetrics                             Anesthesia Physical Anesthesia Plan  ASA: III  Anesthesia Plan: General   Post-op Pain Management:    Induction: Intravenous  Airway Management Planned: Oral ETT  Additional Equipment:   Intra-op Plan:   Post-operative Plan:   Informed Consent: I have reviewed the patients History and Physical, chart, labs and discussed the procedure including the risks, benefits and alternatives for the proposed anesthesia with the patient or authorized representative who has indicated his/her understanding and acceptance.     Plan Discussed with: CRNA  Anesthesia Plan Comments:         Anesthesia Quick Evaluation

## 2016-01-28 NOTE — Interval H&P Note (Signed)
History and Physical Interval Note:  01/28/2016 6:55 AM  Nichole Wells  has presented today for surgery, with the diagnosis of DYSMENORRHEA, UTERINE LEIOMYOMA, PELVIC PAIN  The various methods of treatment have been discussed with the patient and family. After consideration of risks, benefits and other options for treatment, the patient has consented to  Procedure(s): LAPAROSCOPIC ASSISTED VAGINAL HYSTERECTOMY WITH SALPINGO OOPHORECTOMY (Bilateral) as a surgical intervention .  The patient's history has been reviewed, patient examined, no change in status, stable for surgery.  I have reviewed the patient's chart and labs.  Questions were answered to the patient's satisfaction.     Hassell Done A Adrine Hayworth

## 2016-01-28 NOTE — Interval H&P Note (Signed)
History and Physical Interval Note:  01/28/2016 7:31 AM  Nichole Wells  has presented today for surgery, with the diagnosis of DYSMENORRHEA, UTERINE LEIOMYOMA, PELVIC PAIN  The various methods of treatment have been discussed with the patient and family. After consideration of risks, benefits and other options for treatment, the patient has consented to  Procedure(s): LAPAROSCOPIC ASSISTED VAGINAL HYSTERECTOMY WITH SALPINGO OOPHORECTOMY (Bilateral) as a surgical intervention .  The patient's history has been reviewed, patient examined, no change in status, stable for surgery.  I have reviewed the patient's chart and labs.  Questions were answered to the patient's satisfaction.     Hassell Done A Linkoln Alkire

## 2016-01-28 NOTE — Anesthesia Postprocedure Evaluation (Signed)
Anesthesia Post Note  Patient: Nichole Wells  Procedure(s) Performed: Procedure(s) (LRB): LAPAROSCOPIC ASSISTED VAGINAL HYSTERECTOMY WITH SALPINGO OOPHORECTOMY (Bilateral)  Patient location during evaluation: PACU Anesthesia Type: General Level of consciousness: awake and alert Pain management: pain level controlled Vital Signs Assessment: post-procedure vital signs reviewed and stable Respiratory status: spontaneous breathing, nonlabored ventilation, respiratory function stable and patient connected to nasal cannula oxygen Cardiovascular status: blood pressure returned to baseline and stable Postop Assessment: no signs of nausea or vomiting Anesthetic complications: no     Last Vitals:  Vitals:   01/28/16 1337 01/28/16 1446  BP: (!) 109/53 (!) 107/50  Pulse: 78 80  Resp: 20 18  Temp: 36.9 C 36.7 C    Last Pain:  Vitals:   01/28/16 1516  TempSrc:   PainSc: [place]

## 2016-01-28 NOTE — Anesthesia Procedure Notes (Signed)
Procedure Name: Intubation Date/Time: 01/28/2016 7:48 AM Performed by: Leander Rams Pre-anesthesia Checklist: Patient identified, Emergency Drugs available, Suction available, Patient being monitored and Timeout performed Patient Re-evaluated:Patient Re-evaluated prior to inductionOxygen Delivery Method: Circle system utilized Preoxygenation: Pre-oxygenation with 100% oxygen Intubation Type: IV induction Ventilation: Mask ventilation without difficulty Laryngoscope Size: Mac and 3 Grade View: Grade I Tube type: Oral Tube size: 7.0 mm Number of attempts: 1 Airway Equipment and Method: Stylet Secured at: 22 cm Tube secured with: Tape

## 2016-01-28 NOTE — H&P (View-Only) (Signed)
Subjective:  PREOPERATIVE HISTORY AND PHYSICAL    Date of surgery: 01/28/2016 Procedure: LAVH BSO   Patient is a 55 y.o. [id number] scheduled for LAVH BSO on 01/28/2016 for management of chronic pelvic pain, family history of ovarian cancer, and family history of endometriosis. She is status post uterine artery embolization for fibroids. She is status post laparoscopy with peritoneal biopsies which did NOT demonstrate pathologic confirmation of endometriosis:  DIAGNOSIS:  A. LEFT UTEROSACRAL LIGAMENT; BIOPSY:  - FIBROADIPOSE TISSUE.   B. LOWER LEFT UTERINE SEGMENT NODULE; BIOPSY:  - DENSE FIBROMUSCULAR TISSUE WITH FEATURES OF A LEIOMYOMA.  - NEGATIVE FOR ATYPIA, INCREASED MITOSES AND NECROSIS.   C. LOWER RIGHT UTERINE SEGMENT NODULE; BIOPSY:  - FEATURES OF A LEIOMYOMA.  - NEGATIVE FOR ATYPIA, INCREASED MITOSES AND NECROSIS.   D. YELLOW LESION, LEFT FALLOPIAN TUBE; BIOPSY:  - PARATUBAL CYSTS.  Findings at the time of laparoscopy included small multiple fibroids, scarring of uterosacral ligament.  Patient now desires definitive surgery for her chronic pelvic pain and associated comorbidities.    Pertinent Gynecological History: Menarche: Age 47 Intervals: Regular on a monthly basis Duration: 3 days Dysmenorrhea: Mild currently; past history of moderate dysmenorrhea which did cause her to miss school and work in the past on an infrequent basis. History of uterine fibroids. Status post uterine artery embolization in 2010 because of menorrhagia; post procedure bleeding normalized History of previous D&C 2 because of menorrhagia without hyperplasia or carcinoma findings Family history of ovarian cancer in maternal aunt; no genetic testing was performed. Family history of breast cancer ina different maternal aunt Family history of endometriosis in daughter who recently underwent hysterectomy   Menstrual History: OB History    Gravida Para Term Preterm AB Living   2 2 2     2     SAB TAB Ectopic Multiple Live Births           2      Menarche age:NA No LMP recorded. Patient has had an ablation.    Past Medical History:  Diagnosis Date  . Anxiety   . Arthritis    poss ra  . Heart murmur   . Hypertension   . Vitamin D deficiency     Past Surgical History:  Procedure Laterality Date  . BREAST BIOPSY Left    stereo- neg  . BREAST CYST ASPIRATION Right    neg  . BREAST SURGERY     benign biopsy  . CHOLECYSTECTOMY    . DILATION AND CURETTAGE OF UTERUS    . LAPAROSCOPIC LYSIS OF ADHESIONS  12/10/2015   Procedure: LAPAROSCOPIC LYSIS OF ADHESIONS;  Surgeon: Brayton Mars, MD;  Location: [facility];  Service: Gynecology;;  . LAPAROSCOPY N/A 12/10/2015   Procedure: LAPAROSCOPY DIAGNOSTIC WITH BIOPSIES;  Surgeon: Brayton Mars, MD;  Location: [facility];  Service: Gynecology;  Laterality: N/A;  . SHOULDER ARTHROSCOPY Right   . TUBAL LIGATION    . UTERINE FIBROID EMBOLIZATION      OB History  Gravida Para Term Preterm AB Living  2 2 2     2   SAB TAB Ectopic Multiple Live Births          2    # Outcome Date GA Lbr Len/2nd Weight Sex Delivery Anes PTL Lv  2 Term 1988   7 lb 9.6 oz (3.447 kg) F Vag-Spont   LIV  1 Term 1986   7 lb 9.6 oz (3.447 kg) M Vag-Spont   LIV      Social  History   Social History  . Marital status: Married    Spouse name: N/A  . Number of children: N/A  . Years of education: N/A   Social History Main Topics  . Smoking status: Current Every Day Smoker    Packs/day: 0.50    Types: Cigarettes  . Smokeless tobacco: Never Used  . Alcohol use 0.0 oz/week     Comment: Occasional  . Drug use: No  . Sexual activity: Yes    Birth control/ protection: Surgical     Comment: occasional   Other Topics Concern  . None   Social History Narrative  . None    Family History  Problem Relation Age of Onset  . Cancer Mother     cervical, lung  . Heart disease Father   . Hypertension Father   . Breast cancer  Maternal Aunt   . Ovarian cancer Maternal Aunt   . Diabetes Neg Hx      (Not in a hospital admission)  Allergies  Allergen Reactions  . Hydroxychloroquine Itching    Pt take zyrtec to alleviate symptoms   . Penicillin G Hives    Has patient had a PCN reaction causing immediate rash, facial/tongue/throat swelling, SOB or lightheadedness with hypotension:unsure Has patient had a PCN reaction causing severe rash involving mucus membranes or skin necrosis:unsure Has patient had a PCN reaction that required hospitalization:unsure Has patient had a PCN reaction occurring within the last 10 years:No If all of the above answers are "NO", then may proceed with Cephalosporin use.     Review of Systems Constitutional: No recent fever/chills/sweats Respiratory: No recent cough/bronchitis Cardiovascular: No chest pain Gastrointestinal: No recent nausea/vomiting/diarrhea Genitourinary: No UTI symptoms Hematologic/lymphatic:No history of coagulopathy or recent blood thinner use    Objective:    BP (!) 152/80   Pulse 64   Ht 5\' 2"  (1.575 m)   Wt 188 lb 1.6 oz (85.3 kg)   BMI 34.40 kg/m   General:   Normal  Skin:   normal  HEENT:  Normal  Neck:  Supple without Adenopathy or Thyromegaly  Lungs:   Heart:              Breasts:   Abdomen:  Pelvis:  M/S   Extremeties:  Neuro:    clear to auscultation bilaterally   Normal without murmur   Not Examined   soft, non-tender; bowel sounds normal; no masses,  no organomegaly   Exam deferred to OR  No CVAT  Warm/Dry   Normal       11/13/2015 PELVIC: External Genitalia: Normal BUS: Normal Vagina: Normal Cervix: Parous; no lesions; mild cervical motion tenderness Uterus: Enlarged 12 weeksize, irregular shape, normal consistency, mobile, slightly tender with left-sided prominence Adnexa: Normal; nonpalpable nontender RV: Normal external  exam Bladder: Nontender  Assessment:    1. Chronic pelvic pain 2. Abdominal bloating 3. Family history of ovarian cancer 4. Family history of endometriosis 5. Uterine fibroids 6. Dense post uterine artery embolization 7. Abnormal uterine bleeding 8. Status post recent laparoscopy with pathology not conclusively demonstrating endometriosis; patient desires definitive surgery    Plan:  LAVH BSO   Preop counseling: Patient is to undergo LAVH BSO for chronic pelvic pain thought to be secondary to symptomatic endometriosis as well as family history of ovarian cancer. She is understanding of the planned procedure and is aware of and is accepting of all surgical risks which include but are not limited to bleeding, infection, pelvic organ injury with need for repair,  blood clot disorders, anesthesia risks, etc. All questions have been answered. Informed consent is given. Patient is ready and willing to proceed with surgery as scheduled.  Brayton Mars, MD  Note: This dictation was prepared with Dragon dictation along with smaller phrase technology. Any transcriptional errors that result from this process are unintentional.

## 2016-01-29 DIAGNOSIS — G8929 Other chronic pain: Secondary | ICD-10-CM | POA: Diagnosis not present

## 2016-01-29 LAB — HEMOGLOBIN: HEMOGLOBIN: 11.9 g/dL — AB (ref 12.0–16.0)

## 2016-01-29 MED ORDER — OXYCODONE-ACETAMINOPHEN 5-325 MG PO TABS: 1.0000 | ORAL_TABLET | ORAL | 0 refills | Status: DC | PRN

## 2016-01-29 MED ORDER — DOCUSATE SODIUM 100 MG PO CAPS: 100.0000 mg | ORAL_CAPSULE | Freq: Two times a day (BID) | ORAL | 0 refills | Status: DC

## 2016-01-29 NOTE — Discharge Summary (Signed)
Physician Discharge Summary  Patient ID: Nichole Wells MRN: [id number] DOB/AGE: Mar 23, 1960 55 y.o.  Admit date: 01/28/2016 Discharge date: 01/29/2016  Admission Diagnoses: Chronic Pelvic Pain, Endometriosis and Fibroids  Discharge Diagnoses:  SAA  Operative Procedures: Procedure(s): LAPAROSCOPIC ASSISTED VAGINAL HYSTERECTOMY WITH SALPINGO OOPHORECTOMY (Bilateral)  Hospital Course: Uncomplicated    Significant Diagnostic Studies:  Lab Results  Component Value Date   HGB 11.9 (L) 01/29/2016   HGB 14.2 01/23/2016   HGB 14.6 12/06/2015   Lab Results  Component Value Date   HCT 41.4 01/23/2016   HCT 41.9 12/06/2015   HCT 39.1 04/29/2015   CBC Latest Ref Rng & Units 01/29/2016 01/23/2016 12/06/2015  WBC 3.6 - 11.0 K/uL - 10.9 8.6  Hemoglobin 12.0 - 16.0 g/dL 11.9(L) 14.2 14.6  Hematocrit 35.0 - 47.0 % - 41.4 41.9  Platelets 150 - 440 K/uL - 355 323     Discharged Condition: good  Discharge Exam: Blood pressure (!) 119/59, pulse 66, temperature 98.3 F (36.8 C), temperature source Oral, resp. rate 18, last menstrual period 12/24/2015, SpO2 94 %. Incision/Wound: clean, dry and no drainage  Disposition: 01-Home or Self Care  Discharge Instructions    Discharge patient    Complete by:  As directed      Allergies as of 01/29/2016      Reactions   Hydroxychloroquine Itching   Pt take zyrtec to alleviate symptoms    Penicillin G Hives   Has patient had a PCN reaction causing immediate rash, facial/tongue/throat swelling, SOB or lightheadedness with hypotension:unsure Has patient had a PCN reaction causing severe rash involving mucus membranes or skin necrosis:unsure Has patient had a PCN reaction that required hospitalization:unsure Has patient had a PCN reaction occurring within the last 10 years:No If all of the above answers are "NO", then may proceed with Cephalosporin use.      Medication List    TAKE these medications   amLODipine 10 MG tablet Commonly  known as:  NORVASC Take 1 tablet (10 mg total) by mouth daily.   cetirizine 10 MG tablet Commonly known as:  ZYRTEC Take 10 mg by mouth daily.   docusate sodium 100 MG capsule Commonly known as:  COLACE Take 1 capsule (100 mg total) by mouth 2 (two) times daily.   etodolac 400 MG tablet Commonly known as:  LODINE Take 400 mg by mouth 2 (two) times daily as needed for moderate pain.   FLUoxetine 40 MG capsule Commonly known as:  PROZAC Take 1 capsule (40 mg total) by mouth daily. What changed:  when to take this   hydrochlorothiazide 12.5 MG tablet Commonly known as:  HYDRODIURIL TAKE 1 TABLET (12.5 MG TOTAL) BY MOUTH DAILY.   hydroxychloroquine 200 MG tablet Commonly known as:  PLAQUENIL Take 200 mg by mouth 2 (two) times daily.   metoprolol succinate 50 MG 24 hr tablet Commonly known as:  TOPROL-XL TAKE 1 TABLET (50 MG TOTAL) BY MOUTH DAILY. TAKE WITH OR IMMEDIATELY FOLLOWING A MEAL. What changed:  when to take this  additional instructions   naproxen sodium 220 MG tablet Commonly known as:  ANAPROX Take 220 mg by mouth 2 (two) times daily as needed (for pain.).   omeprazole 20 MG capsule Commonly known as:  PRILOSEC Take 20 mg by mouth as needed.   oxyCODONE-acetaminophen 5-325 MG tablet Commonly known as:  PERCOCET/ROXICET Take 1-2 tablets by mouth every 4 (four) hours as needed (moderate to severe pain (when tolerating fluids)).   pravastatin 40 MG tablet Commonly known  as:  PRAVACHOL Take 1 tablet (40 mg total) by mouth daily. What changed:  when to take this   Vitamin D3 50000 units Caps Take 1 capsule by mouth once a week. What changed:  when to take this      Follow-up Information    Brayton Mars, MD. Go in 2 week(s).   Specialties:  Obstetrics and Gynecology, Radiology Why:  Post Op Check Contact information: [address] [place] [address] [phone]           Signed: Alanda Slim Loyce Flaming 01/29/2016,  12:12 PM

## 2016-01-29 NOTE — Progress Notes (Signed)
Discharge inst reviewed with pt.  Verb u/o.  Rx given for home use

## 2016-01-30 LAB — SURGICAL PATHOLOGY

## 2016-02-07 ENCOUNTER — Other Ambulatory Visit: Payer: Self-pay | Admitting: Family Medicine

## 2016-02-13 ENCOUNTER — Ambulatory Visit (INDEPENDENT_AMBULATORY_CARE_PROVIDER_SITE_OTHER): Payer: Managed Care, Other (non HMO) | Admitting: Obstetrics and Gynecology

## 2016-02-13 VITALS — BP 121/74 | HR 67 | Ht 62.0 in | Wt 184.8 lb

## 2016-02-13 DIAGNOSIS — N800 Endometriosis of uterus: Secondary | ICD-10-CM

## 2016-02-13 DIAGNOSIS — N946 Dysmenorrhea, unspecified: Secondary | ICD-10-CM

## 2016-02-13 DIAGNOSIS — N809 Endometriosis, unspecified: Secondary | ICD-10-CM

## 2016-02-13 DIAGNOSIS — Z842 Family history of other diseases of the genitourinary system: Secondary | ICD-10-CM

## 2016-02-13 DIAGNOSIS — Z90710 Acquired absence of both cervix and uterus: Secondary | ICD-10-CM

## 2016-02-13 DIAGNOSIS — Z09 Encounter for follow-up examination after completed treatment for conditions other than malignant neoplasm: Secondary | ICD-10-CM

## 2016-02-13 DIAGNOSIS — D271 Benign neoplasm of left ovary: Secondary | ICD-10-CM | POA: Insufficient documentation

## 2016-02-13 DIAGNOSIS — N8003 Adenomyosis of the uterus: Secondary | ICD-10-CM | POA: Insufficient documentation

## 2016-02-13 DIAGNOSIS — R102 Pelvic and perineal pain: Secondary | ICD-10-CM

## 2016-02-13 DIAGNOSIS — Z9889 Other specified postprocedural states: Secondary | ICD-10-CM

## 2016-02-13 DIAGNOSIS — D259 Leiomyoma of uterus, unspecified: Secondary | ICD-10-CM

## 2016-02-13 NOTE — Progress Notes (Signed)
Chief complaint: 1. Postop check 2. Status post LAVH BSO 3. Symptomatic endometriosis with chronic pelvic pain  PATHOLOGY: DIAGNOSIS:  A. UTERUS WITH CERVIX, BILATERAL FALLOPIAN TUBES AND OVARIES; TOTAL  HYSTERECTOMY WITH BILATERAL SALPINGO-OOPHORECTOMY:  - CHRONIC CERVICITIS WITH SQUAMOUS METAPLASIA.  - PROLIFERATIVE ENDOMETRIUM.  - MYOMETRIUM WITH ADENOMYOSIS AND CALCIFICATION.  - CHANGES CONSISTENT WITH EMBOLIZATION INTERVENTION.  - BILATERAL FALLOPIAN TUBES WITH PARATUBAL CYSTS.  - BILATERAL OVARIES WITH CYSTIC FOLLICLES.  - LEFT OVARY WITH MATURE TERATOMA CONTAINING BENIGN RESPIRATORY AND  SQUAMOUS EPITHELIUM, CARTILAGE AND THYROID TISSUE.  - SEPARATE FRAGMENT OF LEIOMYOMA, 2.0 CM.  - NEGATIVE FOR ATYPIA AND MALIGNANCY.     SUBJECTIVE: Patient noticed mild abdominal soreness. Bowel function is normal. Bladder function is normal. She is not currently taking any pain medication. Questions about return to work were addressed.  OBJECTIVE: BP 121/74   Pulse 67   Ht 5\' 2"  (1.575 m)   Wt 184 lb 12.8 oz (83.8 kg)   LMP 12/24/2015 (Approximate)   BMI 33.80 kg/m  Well-appearing white female in no acute distress Abdomen: Soft, nontender; laparoscopy incisions are well approximated without evidence of hernia; Dermabond glue is still present. Pelvic: Deferred   ASSESSMENT: 1. Normal postop check 2 weeks status post LAVH BSO 2. Pathology: Adenomyosis, left ovarian mature cystic teratoma  PLAN: 1. Resume activities as tolerated 2. Return in 4 weeks for final postop check  Brayton Mars, MD  Note: This dictation was prepared with Dragon dictation along with smaller phrase technology. Any transcriptional errors that result from this process are unintentional.

## 2016-02-13 NOTE — Patient Instructions (Signed)
1. Resume activities as tolerated 2. Return for final postop check in 4 weeks

## 2016-02-22 ENCOUNTER — Other Ambulatory Visit: Payer: Self-pay | Admitting: Family Medicine

## 2016-02-22 DIAGNOSIS — I10 Essential (primary) hypertension: Secondary | ICD-10-CM

## 2016-03-12 ENCOUNTER — Ambulatory Visit (INDEPENDENT_AMBULATORY_CARE_PROVIDER_SITE_OTHER): Payer: Managed Care, Other (non HMO) | Admitting: Obstetrics and Gynecology

## 2016-03-12 ENCOUNTER — Encounter: Payer: Self-pay | Admitting: Obstetrics and Gynecology

## 2016-03-12 VITALS — BP 114/74 | HR 85 | Ht 62.0 in | Wt 188.6 lb

## 2016-03-12 DIAGNOSIS — Z90710 Acquired absence of both cervix and uterus: Secondary | ICD-10-CM

## 2016-03-12 DIAGNOSIS — Z09 Encounter for follow-up examination after completed treatment for conditions other than malignant neoplasm: Secondary | ICD-10-CM

## 2016-03-12 MED ORDER — ESTRADIOL 1 MG PO TABS: 1.0000 mg | ORAL_TABLET | Freq: Every day | ORAL | 1 refills | Status: DC

## 2016-03-12 NOTE — Progress Notes (Signed)
Chief complaint: 1. Status post LAVH BSO 2. Final postop check  Patient presents for her final postop check after LAVH BSO on 01/28/2016. Bowel bladder function are normal. She is not experiencing any significant pelvic pain. Patient reports no significant vasomotor symptoms; however, she is experiencing some emotional lability; she desires to try with estrogen replacement therapy  PATHOLOGY: DIAGNOSIS:  A. UTERUS WITH CERVIX, BILATERAL FALLOPIAN TUBES AND OVARIES; TOTAL  HYSTERECTOMY WITH BILATERAL SALPINGO-OOPHORECTOMY:  - CHRONIC CERVICITIS WITH SQUAMOUS METAPLASIA.  - PROLIFERATIVE ENDOMETRIUM.  - MYOMETRIUM WITH ADENOMYOSIS AND CALCIFICATION.  - CHANGES CONSISTENT WITH EMBOLIZATION INTERVENTION.  - BILATERAL FALLOPIAN TUBES WITH PARATUBAL CYSTS.  - BILATERAL OVARIES WITH CYSTIC FOLLICLES.  - LEFT OVARY WITH MATURE TERATOMA CONTAINING BENIGN RESPIRATORY AND  SQUAMOUS EPITHELIUM, CARTILAGE AND THYROID TISSUE.  - SEPARATE FRAGMENT OF LEIOMYOMA, 2.0 CM.  - NEGATIVE FOR ATYPIA AND MALIGNANCY.    OBJECTIVE: BP 114/74   Pulse 85   Ht 5\' 2"  (1.575 m)   Wt 188 lb 9.6 oz (85.5 kg)   LMP 12/24/2015 (Approximate)   BMI 34.50 kg/m  Pleasant well-appearing female in no acute distress Abdomen: Soft, nontender; laparoscopy port sites are well-healed without evidence of hernia. Pelvic: External genitalia normal BUS-normal Vagina-good vault support; residual tissue was removed from top of the vaginal cuff; no significant tenderness or mass Cervix-surgically absent Uterus-surgically absent Bimanual-old masses or tenderness Rectovaginal-normal external exam  ASSESSMENT: 1. Status post LAVH BSO 6 weeks ago 2. Normal postop check 3. Mild emotional lability  PLAN: 1. Resume activities as tolerated 2. Start estradiol 1 mg a day 3. Recommend calcium with vitamin D supplementation twice a day 4. 6 weeks for follow-up 5. R for return to work is given  Brayton Mars, MD  Note:  This dictation was prepared with Dragon dictation along with smaller phrase technology. Any transcriptional errors that result from this process are unintentional.

## 2016-03-12 NOTE — Patient Instructions (Signed)
1. Resume all activities without restriction 2. Estradiol 1 mg a day described 3. Recommend calcium 600 mg with vitamin D 400 international units twice a day 4. Return in 6 weeks for follow-up

## 2016-03-26 ENCOUNTER — Other Ambulatory Visit: Payer: Self-pay | Admitting: Family Medicine

## 2016-03-26 DIAGNOSIS — F419 Anxiety disorder, unspecified: Secondary | ICD-10-CM

## 2016-03-26 DIAGNOSIS — F329 Major depressive disorder, single episode, unspecified: Secondary | ICD-10-CM

## 2016-03-26 DIAGNOSIS — E785 Hyperlipidemia, unspecified: Secondary | ICD-10-CM

## 2016-03-26 DIAGNOSIS — F32A Depression, unspecified: Secondary | ICD-10-CM

## 2016-04-16 ENCOUNTER — Encounter: Payer: Self-pay | Admitting: Family Medicine

## 2016-04-16 ENCOUNTER — Ambulatory Visit (INDEPENDENT_AMBULATORY_CARE_PROVIDER_SITE_OTHER): Payer: 59 | Admitting: Family Medicine

## 2016-04-16 VITALS — BP 118/76 | HR 90 | Temp 98.3°F | Resp 16 | Ht 62.0 in | Wt 191.2 lb

## 2016-04-16 DIAGNOSIS — J0100 Acute maxillary sinusitis, unspecified: Secondary | ICD-10-CM

## 2016-04-16 MED ORDER — AZITHROMYCIN 250 MG PO TABS: ORAL_TABLET | ORAL | 0 refills | Status: DC

## 2016-04-16 MED ORDER — FLUTICASONE PROPIONATE 50 MCG/ACT NA SUSP: 2.0000 | Freq: Every day | NASAL | 0 refills | Status: DC

## 2016-04-16 NOTE — Progress Notes (Signed)
Name: Nichole Wells   MRN: [id number]    DOB: 1960/06/08   Date:04/16/2016       Progress Note  Subjective  Chief Complaint  Chief Complaint  Patient presents with  . Acute Visit    Possible sinus infection x2 weeks    Sinusitis  This is a new problem. The current episode started 1 to 4 weeks ago (2 weeks ago). There has been no fever. Associated symptoms include congestion, coughing, ear pain (feeling itchiness in ears), sinus pressure and a sore throat. Pertinent negatives include no sneezing. Past treatments include oral decongestants (took Coricidin yesterday).     Past Medical History:  Diagnosis Date  . Anxiety   . Arthritis    poss ra  . GERD (gastroesophageal reflux disease)    occ  . Heart murmur   . Hypertension   . Vitamin D deficiency     Past Surgical History:  Procedure Laterality Date  . BREAST BIOPSY Left    stereo- neg  . BREAST CYST ASPIRATION Right    neg  . BREAST SURGERY     benign biopsy  . CHOLECYSTECTOMY    . DILATION AND CURETTAGE OF UTERUS    . LAPAROSCOPIC LYSIS OF ADHESIONS  12/10/2015   Procedure: LAPAROSCOPIC LYSIS OF ADHESIONS;  Surgeon: Brayton Mars, MD;  Location: [facility];  Service: Gynecology;;  . LAPAROSCOPIC VAGINAL HYSTERECTOMY WITH SALPINGO OOPHORECTOMY Bilateral 01/28/2016   Procedure: LAPAROSCOPIC ASSISTED VAGINAL HYSTERECTOMY WITH SALPINGO OOPHORECTOMY;  Surgeon: Brayton Mars, MD;  Location: [facility];  Service: Gynecology;  Laterality: Bilateral;  . LAPAROSCOPY N/A 12/10/2015   Procedure: LAPAROSCOPY DIAGNOSTIC WITH BIOPSIES;  Surgeon: Brayton Mars, MD;  Location: [facility];  Service: Gynecology;  Laterality: N/A;  . SHOULDER ARTHROSCOPY Right   . TUBAL LIGATION    . UTERINE FIBROID EMBOLIZATION      Family History  Problem Relation Age of Onset  . Cancer Mother     cervical, lung  . Heart disease Father   . Hypertension Father   . Breast cancer Maternal Aunt   . Ovarian cancer Maternal Aunt   .  Diabetes Neg Hx     Social History   Social History  . Marital status: Married    Spouse name: N/A  . Number of children: N/A  . Years of education: N/A   Occupational History  . Not on file.   Social History Main Topics  . Smoking status: Current Every Day Smoker    Packs/day: 0.50    Years: 20.00    Types: Cigarettes  . Smokeless tobacco: Never Used  . Alcohol use 0.0 oz/week     Comment: Occasional  . Drug use: No  . Sexual activity: Yes    Birth control/ protection: Surgical     Comment: occasional   Other Topics Concern  . Not on file   Social History Narrative  . No narrative on file     Current Outpatient Prescriptions:  .  amLODipine (NORVASC) 10 MG tablet, TAKE 1 TABLET (10 MG TOTAL) BY MOUTH DAILY., Disp: 90 tablet, Rfl: 1 .  cetirizine (ZYRTEC) 10 MG tablet, Take 10 mg by mouth daily., Disp: , Rfl:  .  estradiol (ESTRACE) 1 MG tablet, Take 1 tablet (1 mg total) by mouth daily., Disp: 30 tablet, Rfl: 1 .  etodolac (LODINE) 400 MG tablet, Take 400 mg by mouth 2 (two) times daily as needed for moderate pain. , Disp: , Rfl:  .  FLUoxetine (PROZAC) 40 MG  capsule, Take 1 capsule (40 mg total) by mouth daily. (Patient taking differently: Take 40 mg by mouth every morning. ), Disp: 90 capsule, Rfl: 1 .  hydrochlorothiazide (HYDRODIURIL) 12.5 MG tablet, TAKE 1 TABLET (12.5 MG TOTAL) BY MOUTH DAILY., Disp: 90 tablet, Rfl: 1 .  hydroxychloroquine (PLAQUENIL) 200 MG tablet, Take 200 mg by mouth 2 (two) times daily., Disp: , Rfl:  .  metoprolol succinate (TOPROL-XL) 50 MG 24 hr tablet, TAKE 1 TABLET (50 MG TOTAL) BY MOUTH DAILY. TAKE WITH OR IMMEDIATELY FOLLOWING A MEAL. (Patient taking differently: Take 50 mg by mouth every morning. Take with or immediately following a meal.), Disp: 90 tablet, Rfl: 1 .  pravastatin (PRAVACHOL) 40 MG tablet, Take 1 tablet (40 mg total) by mouth daily. (Patient taking differently: Take 40 mg by mouth every evening. ), Disp: 90 tablet, Rfl:  1  Allergies  Allergen Reactions  . Hydroxychloroquine Itching    Pt take zyrtec to alleviate symptoms   . Penicillin G Hives    Has patient had a PCN reaction causing immediate rash, facial/tongue/throat swelling, SOB or lightheadedness with hypotension:unsure Has patient had a PCN reaction causing severe rash involving mucus membranes or skin necrosis:unsure Has patient had a PCN reaction that required hospitalization:unsure Has patient had a PCN reaction occurring within the last 10 years:No If all of the above answers are "NO", then may proceed with Cephalosporin use.      Review of Systems  HENT: Positive for congestion, ear pain (feeling itchiness in ears), sinus pressure and sore throat. Negative for sneezing.   Respiratory: Positive for cough.      Objective  Vitals:   04/16/16 0940  BP: 118/76  Pulse: 90  Resp: 16  Temp: 98.3 F (36.8 C)  TempSrc: Oral  SpO2: 96%  Weight: 191 lb 3.2 oz (86.7 kg)  Height: 5\' 2"  (1.575 m)    Physical Exam  Constitutional: She is oriented to person, place, and time and well-developed, well-nourished, and in no distress.  HENT:  Head: Normocephalic and atraumatic.  Right Ear: Tympanic membrane and ear canal normal. No drainage or swelling.  Left Ear: No drainage or swelling.  Nose: Right sinus exhibits maxillary sinus tenderness. Left sinus exhibits maxillary sinus tenderness.  Mouth/Throat: Posterior oropharyngeal erythema present. No oropharyngeal exudate.  Nasal turbinate hypertrophy, mucosal inflammation  Neck: Neck supple.  Cardiovascular: Normal rate, regular rhythm, S1 normal, S2 normal and normal heart sounds.   No murmur heard. Pulmonary/Chest: Effort normal and breath sounds normal. She has no wheezes.  Neurological: She is alert and oriented to person, place, and time.  Nursing note and vitals reviewed.    Assessment & Plan  1. Acute non-recurrent maxillary sinusitis By history and exam, start on Z-Pak and  Flonase - azithromycin (ZITHROMAX) 250 MG tablet; 2 tabs po day 1, then 1 tab po q day x 4 days  Dispense: 6 tablet; Refill: 0 - fluticasone (FLONASE) 50 MCG/ACT nasal spray; Place 2 sprays into both nostrils daily.  Dispense: 16 g; Refill: 0   Teren Zurcher [facility] 04/16/2016 10:05 AM

## 2016-04-23 ENCOUNTER — Encounter: Payer: Self-pay | Admitting: Obstetrics and Gynecology

## 2016-04-23 ENCOUNTER — Ambulatory Visit (INDEPENDENT_AMBULATORY_CARE_PROVIDER_SITE_OTHER): Payer: 59 | Admitting: Obstetrics and Gynecology

## 2016-04-23 VITALS — BP 123/71 | HR 68 | Ht 62.0 in | Wt 190.5 lb

## 2016-04-23 DIAGNOSIS — Z79890 Hormone replacement therapy (postmenopausal): Secondary | ICD-10-CM

## 2016-04-23 DIAGNOSIS — E8940 Asymptomatic postprocedural ovarian failure: Secondary | ICD-10-CM

## 2016-04-23 DIAGNOSIS — N898 Other specified noninflammatory disorders of vagina: Secondary | ICD-10-CM

## 2016-04-23 MED ORDER — ESTRADIOL 1 MG PO TABS: 1.0000 mg | ORAL_TABLET | Freq: Every day | ORAL | 1 refills | Status: DC

## 2016-04-23 NOTE — Patient Instructions (Signed)
1. Continue estradiol 1 mg a day 2. Continue with routine physical activities without restriction 3. Return in 12 months for an annual gynecologic physical 4. Continue with primary care follow-up-Dr. Brigitte Pulse

## 2016-04-23 NOTE — Progress Notes (Signed)
Chief complaint: 1. Surgical menopause 2. Granulation tissue  Patient presents for follow-up. She was started on estradiol 1 mg a day and is not having any significant vasomotor symptoms. Emotional lability has diminished to a baseline level. Bowel and bladder function are normal. She She is not experiencing any vaginal spotting or discharge. She is not experiencing any pelvic pain. Last visit was notable for granulation tissue being removed from the vaginal apex.  OBJECTIVE: BP 123/71   Pulse 68   Ht 5\' 2"  (1.575 m)   Wt 190 lb 8 oz (86.4 kg)   LMP 12/24/2015 (Approximate)   BMI 34.84 kg/m  Pleasant female in no acute distress Abdomen: Soft, nontender; laparoscopy incisions are well-healed Pelvic exam: External genitalia-normal BUS-normal Vagina-excellent vault support; 5 mm fragment of granulation tissue is identified at the vaginal cuff apex, nonfriable Cervix-surgically absent Uterus surgically absent BiManual-no palpable tenderness or mass Rectovaginal-normal external exam  ASSESSMENT: 1. Surgical menopause status post LAVH BSO for symptomatic endometriosis, uterine fibroids, and dermoid cyst, stable on ERT 2. Minimal residual granulation tissue noted vaginal cuff apex, Ace  PLAN: 1. Continue with routine activities without restriction 2. Return in 1 year for physical exam 3. Return sooner if vaginal spotting, bleeding, or hernia develops 4. Continue taking estradiol 1 mg a day  A total of 15 minutes were spent face-to-face with the patient during this encounter and over half of that time dealt with counseling and coordination of care.   Brayton Mars, MD  Note: This dictation was prepared with Dragon dictation along with smaller phrase technology. Any transcriptional errors that result from this process are unintentional. .[email or web address]

## 2016-04-24 ENCOUNTER — Other Ambulatory Visit: Payer: Self-pay | Admitting: Family Medicine

## 2016-04-24 DIAGNOSIS — F419 Anxiety disorder, unspecified: Principal | ICD-10-CM

## 2016-04-24 DIAGNOSIS — F329 Major depressive disorder, single episode, unspecified: Secondary | ICD-10-CM

## 2016-04-24 DIAGNOSIS — F32A Depression, unspecified: Secondary | ICD-10-CM

## 2016-04-24 NOTE — Telephone Encounter (Signed)
Recently seen; covering for primary; Rx approved

## 2016-05-28 ENCOUNTER — Ambulatory Visit: Payer: 59 | Admitting: Family Medicine

## 2016-05-28 ENCOUNTER — Encounter: Payer: Self-pay | Admitting: Family Medicine

## 2016-05-28 ENCOUNTER — Ambulatory Visit (INDEPENDENT_AMBULATORY_CARE_PROVIDER_SITE_OTHER): Payer: 59 | Admitting: Family Medicine

## 2016-05-28 VITALS — BP 124/81 | HR 72 | Temp 98.0°F | Resp 16 | Ht 62.0 in | Wt 192.2 lb

## 2016-05-28 DIAGNOSIS — E7800 Pure hypercholesterolemia, unspecified: Secondary | ICD-10-CM

## 2016-05-28 DIAGNOSIS — E559 Vitamin D deficiency, unspecified: Secondary | ICD-10-CM

## 2016-05-28 DIAGNOSIS — I10 Essential (primary) hypertension: Secondary | ICD-10-CM | POA: Diagnosis not present

## 2016-05-28 MED ORDER — METOPROLOL SUCCINATE ER 50 MG PO TB24: 50.0000 mg | ORAL_TABLET | Freq: Every day | ORAL | 1 refills | Status: DC

## 2016-05-28 MED ORDER — HYDROCHLOROTHIAZIDE 12.5 MG PO TABS: 12.5000 mg | ORAL_TABLET | Freq: Every day | ORAL | 1 refills | Status: DC

## 2016-05-28 MED ORDER — PRAVASTATIN SODIUM 40 MG PO TABS: 40.0000 mg | ORAL_TABLET | Freq: Every day | ORAL | 1 refills | Status: DC

## 2016-05-28 NOTE — Progress Notes (Signed)
Name: Nichole Wells   MRN: [id number]    DOB: May 09, 1960   Date:05/28/2016       Progress Note  Subjective  Chief Complaint  Chief Complaint  Patient presents with  . Follow-up    BP    Hypertension  This is a chronic problem. The problem is unchanged. The problem is controlled. Pertinent negatives include no blurred vision, chest pain, headaches, orthopnea, palpitations or shortness of breath. Past treatments include beta blockers, calcium channel blockers and diuretics. There is no history of kidney disease, CAD/MI or CVA.  Hyperlipidemia  This is a chronic problem. The problem is controlled. Recent lipid tests were reviewed and are normal. Pertinent negatives include no chest pain, leg pain, myalgias or shortness of breath. Current antihyperlipidemic treatment includes statins.  Patient has deficiency of vitamin D, last level obtained in September 2017 was 24 ng per mL, she has finished taking a 3 month course of vitamin D3 50,000 units, will repeat levels today   Past Medical History:  Diagnosis Date  . Anxiety   . Arthritis    poss ra  . GERD (gastroesophageal reflux disease)    occ  . Heart murmur   . Hypertension   . Vitamin D deficiency     Past Surgical History:  Procedure Laterality Date  . BREAST BIOPSY Left    stereo- neg  . BREAST CYST ASPIRATION Right    neg  . BREAST SURGERY     benign biopsy  . CHOLECYSTECTOMY    . DILATION AND CURETTAGE OF UTERUS    . LAPAROSCOPIC LYSIS OF ADHESIONS  12/10/2015   Procedure: LAPAROSCOPIC LYSIS OF ADHESIONS;  Surgeon: Brayton Mars, MD;  Location: [facility];  Service: Gynecology;;  . LAPAROSCOPIC VAGINAL HYSTERECTOMY WITH SALPINGO OOPHORECTOMY Bilateral 01/28/2016   Procedure: LAPAROSCOPIC ASSISTED VAGINAL HYSTERECTOMY WITH SALPINGO OOPHORECTOMY;  Surgeon: Brayton Mars, MD;  Location: [facility];  Service: Gynecology;  Laterality: Bilateral;  . LAPAROSCOPY N/A 12/10/2015   Procedure: LAPAROSCOPY DIAGNOSTIC WITH  BIOPSIES;  Surgeon: Brayton Mars, MD;  Location: [facility];  Service: Gynecology;  Laterality: N/A;  . SHOULDER ARTHROSCOPY Right   . TUBAL LIGATION    . UTERINE FIBROID EMBOLIZATION      Family History  Problem Relation Age of Onset  . Cancer Mother     cervical, lung  . Heart disease Father   . Hypertension Father   . Breast cancer Maternal Aunt   . Ovarian cancer Maternal Aunt   . Diabetes Neg Hx     Social History   Social History  . Marital status: Married    Spouse name: N/A  . Number of children: N/A  . Years of education: N/A   Occupational History  . Not on file.   Social History Main Topics  . Smoking status: Current Every Day Smoker    Packs/day: 0.50    Years: 20.00    Types: Cigarettes  . Smokeless tobacco: Never Used  . Alcohol use 0.0 oz/week     Comment: Occasional  . Drug use: No  . Sexual activity: Yes    Birth control/ protection: Surgical     Comment: occasional   Other Topics Concern  . Not on file   Social History Narrative  . No narrative on file     Current Outpatient Prescriptions:  .  amLODipine (NORVASC) 10 MG tablet, TAKE 1 TABLET (10 MG TOTAL) BY MOUTH DAILY., Disp: 90 tablet, Rfl: 1 .  cetirizine (ZYRTEC) 10 MG tablet, Take  10 mg by mouth daily., Disp: , Rfl:  .  estradiol (ESTRACE) 1 MG tablet, Take 1 tablet (1 mg total) by mouth daily., Disp: 90 tablet, Rfl: 1 .  etodolac (LODINE) 400 MG tablet, Take 400 mg by mouth 2 (two) times daily as needed for moderate pain. , Disp: , Rfl:  .  FLUoxetine (PROZAC) 40 MG capsule, TAKE 1 CAPSULE (40 MG TOTAL) BY MOUTH DAILY., Disp: 90 capsule, Rfl: 0 .  hydrochlorothiazide (HYDRODIURIL) 12.5 MG tablet, TAKE 1 TABLET (12.5 MG TOTAL) BY MOUTH DAILY., Disp: 90 tablet, Rfl: 1 .  hydroxychloroquine (PLAQUENIL) 200 MG tablet, Take 200 mg by mouth 2 (two) times daily., Disp: , Rfl:  .  metoprolol succinate (TOPROL-XL) 50 MG 24 hr tablet, TAKE 1 TABLET (50 MG TOTAL) BY MOUTH DAILY. TAKE WITH  OR IMMEDIATELY FOLLOWING A MEAL. (Patient taking differently: Take 50 mg by mouth every morning. Take with or immediately following a meal.), Disp: 90 tablet, Rfl: 1 .  pravastatin (PRAVACHOL) 40 MG tablet, Take 1 tablet (40 mg total) by mouth daily. (Patient taking differently: Take 40 mg by mouth every evening. ), Disp: 90 tablet, Rfl: 1 .  fluticasone (FLONASE) 50 MCG/ACT nasal spray, Place 2 sprays into both nostrils daily. (Patient not taking: Reported on 05/28/2016), Disp: 16 g, Rfl: 0  Allergies  Allergen Reactions  . Hydroxychloroquine Itching    Pt take zyrtec to alleviate symptoms   . Penicillin G Hives    Has patient had a PCN reaction causing immediate rash, facial/tongue/throat swelling, SOB or lightheadedness with hypotension:unsure Has patient had a PCN reaction causing severe rash involving mucus membranes or skin necrosis:unsure Has patient had a PCN reaction that required hospitalization:unsure Has patient had a PCN reaction occurring within the last 10 years:No If all of the above answers are "NO", then may proceed with Cephalosporin use.      Review of Systems  Eyes: Negative for blurred vision.  Respiratory: Negative for shortness of breath.   Cardiovascular: Negative for chest pain, palpitations and orthopnea.  Musculoskeletal: Negative for myalgias.  Neurological: Negative for headaches.      Objective  Vitals:   05/28/16 1537  BP: 124/81  Pulse: 72  Resp: 16  Temp: 98 F (36.7 C)  TempSrc: Oral  SpO2: 96%  Weight: 192 lb 3.2 oz (87.2 kg)  Height: 5\' 2"  (1.575 m)    Physical Exam  Constitutional: She is oriented to person, place, and time and well-developed, well-nourished, and in no distress.  HENT:  Head: Normocephalic.  Cardiovascular: Normal rate, regular rhythm, S1 normal and S2 normal.   Murmur heard.  Systolic murmur is present with a grade of 2/6  Pulmonary/Chest: Breath sounds normal. She has no wheezes. She has no rhonchi.  Abdominal:  Soft. Bowel sounds are normal.  Musculoskeletal:       Right ankle: She exhibits no swelling.       Left ankle: She exhibits no swelling.  Neurological: She is alert and oriented to person, place, and time.  Skin: Skin is warm and dry.  Psychiatric: Mood, memory, affect and judgment normal.  Nursing note and vitals reviewed.    Assessment & Plan  1. Essential hypertension  - hydrochlorothiazide (HYDRODIURIL) 12.5 MG tablet; Take 1 tablet (12.5 mg total) by mouth daily.  Dispense: 90 tablet; Refill: 1 - metoprolol succinate (TOPROL-XL) 50 MG 24 hr tablet; Take 1 tablet (50 mg total) by mouth daily. Take with or immediately following a meal.  Dispense: 90 tablet; Refill:  1  2. Pure hypercholesterolemia  - COMPLETE METABOLIC PANEL WITH GFR - Lipid panel - pravastatin (PRAVACHOL) 40 MG tablet; Take 1 tablet (40 mg total) by mouth daily.  Dispense: 90 tablet; Refill: 1  3. Vitamin D deficiency  - VITAMIN D 25 Hydroxy (Vit-D Deficiency, Fractures)  [place] Doig Asad A. [facility] 05/28/2016 3:52 PM

## 2016-06-27 ENCOUNTER — Other Ambulatory Visit: Payer: Self-pay | Admitting: Family Medicine

## 2016-06-27 LAB — COMPLETE METABOLIC PANEL WITH GFR
ALBUMIN: 4.1 g/dL (ref 3.6–5.1)
ALK PHOS: 63 U/L (ref 33–130)
ALT: 13 U/L (ref 6–29)
AST: 14 U/L (ref 10–35)
BUN: 10 mg/dL (ref 7–25)
CHLORIDE: 105 mmol/L (ref 98–110)
CO2: 25 mmol/L (ref 20–31)
Calcium: 9.1 mg/dL (ref 8.6–10.4)
Creat: 0.64 mg/dL (ref 0.50–1.05)
GFR, Est African American: >89 mL/min (ref >=60)
GFR, Est Non African American: >89 mL/min (ref >=60)
GLUCOSE: 86 mg/dL (ref 65–99)
POTASSIUM: 4.2 mmol/L (ref 3.5–5.3)
SODIUM: 140 mmol/L (ref 135–146)
Total Bilirubin: 0.4 mg/dL (ref 0.2–1.2)
Total Protein: 6.4 g/dL (ref 6.1–8.1)

## 2016-06-27 LAB — LIPID PANEL
CHOL/HDL RATIO: 2.9 ratio (ref <5.0)
Cholesterol: 161 mg/dL (ref <200)
HDL: 55 mg/dL (ref >50)
LDL CALC: 83 mg/dL (ref <100)
Triglycerides: 114 mg/dL (ref <150)
VLDL: 23 mg/dL (ref <30)

## 2016-06-28 LAB — VITAMIN D 25 HYDROXY (VIT D DEFICIENCY, FRACTURES): Vit D, 25-Hydroxy: 28 ng/mL — ABNORMAL LOW (ref 30–100)

## 2016-07-23 ENCOUNTER — Other Ambulatory Visit: Payer: Self-pay | Admitting: Family Medicine

## 2016-07-23 DIAGNOSIS — F329 Major depressive disorder, single episode, unspecified: Secondary | ICD-10-CM

## 2016-07-23 DIAGNOSIS — F419 Anxiety disorder, unspecified: Principal | ICD-10-CM

## 2016-07-28 ENCOUNTER — Ambulatory Visit (INDEPENDENT_AMBULATORY_CARE_PROVIDER_SITE_OTHER): Payer: 59 | Admitting: Family Medicine

## 2016-07-28 ENCOUNTER — Encounter: Payer: Self-pay | Admitting: Family Medicine

## 2016-07-28 DIAGNOSIS — F329 Major depressive disorder, single episode, unspecified: Secondary | ICD-10-CM

## 2016-07-28 DIAGNOSIS — F419 Anxiety disorder, unspecified: Secondary | ICD-10-CM

## 2016-07-28 MED ORDER — FLUOXETINE HCL 40 MG PO CAPS: 40.0000 mg | ORAL_CAPSULE | Freq: Every day | ORAL | 0 refills | Status: DC

## 2016-07-28 NOTE — Progress Notes (Signed)
Name: Nichole Wells   MRN: [id number]    DOB: [birth year]   Date:07/28/2016       Progress Note  Subjective  Chief Complaint  Chief Complaint  Patient presents with  . Medication Refill    Depression         This is a chronic problem.  The onset quality is gradual.   The problem has been gradually improving since onset.  Associated symptoms include no decreased concentration, no fatigue, no hopelessness, no decreased interest, no headaches and no indigestion.  Past treatments include SSRIs - Selective serotonin reuptake inhibitors.    Past Medical History:  Diagnosis Date  . Anxiety   . Arthritis    poss ra  . GERD (gastroesophageal reflux disease)    occ  . Heart murmur   . Hypertension   . Vitamin D deficiency     Past Surgical History:  Procedure Laterality Date  . BREAST BIOPSY Left    stereo- neg  . BREAST CYST ASPIRATION Right    neg  . BREAST SURGERY     benign biopsy  . CHOLECYSTECTOMY    . DILATION AND CURETTAGE OF UTERUS    . LAPAROSCOPIC LYSIS OF ADHESIONS  12/10/2015   Procedure: LAPAROSCOPIC LYSIS OF ADHESIONS;  Surgeon: Brayton Mars, MD;  Location: [facility];  Service: Gynecology;;  . LAPAROSCOPIC VAGINAL HYSTERECTOMY WITH SALPINGO OOPHORECTOMY Bilateral 01/28/2016   Procedure: LAPAROSCOPIC ASSISTED VAGINAL HYSTERECTOMY WITH SALPINGO OOPHORECTOMY;  Surgeon: Brayton Mars, MD;  Location: [facility];  Service: Gynecology;  Laterality: Bilateral;  . LAPAROSCOPY N/A 12/10/2015   Procedure: LAPAROSCOPY DIAGNOSTIC WITH BIOPSIES;  Surgeon: Brayton Mars, MD;  Location: [facility];  Service: Gynecology;  Laterality: N/A;  . SHOULDER ARTHROSCOPY Right   . TUBAL LIGATION    . UTERINE FIBROID EMBOLIZATION      Family History  Problem Relation Age of Onset  . Cancer Mother        cervical, lung  . Heart disease Father   . Hypertension Father   . Breast cancer Maternal Aunt   . Ovarian cancer Maternal Aunt   . Diabetes Neg Hx     Social  History   Social History  . Marital status: Married    Spouse name: N/A  . Number of children: N/A  . Years of education: N/A   Occupational History  . Not on file.   Social History Main Topics  . Smoking status: Current Every Day Smoker    Packs/day: 0.50    Years: 20.00    Types: Cigarettes  . Smokeless tobacco: Never Used  . Alcohol use 0.0 oz/week     Comment: Occasional  . Drug use: No  . Sexual activity: Yes    Birth control/ protection: Surgical     Comment: occasional   Other Topics Concern  . Not on file   Social History Narrative  . No narrative on file     Current Outpatient Prescriptions:  .  amLODipine (NORVASC) 10 MG tablet, TAKE 1 TABLET (10 MG TOTAL) BY MOUTH DAILY., Disp: 90 tablet, Rfl: 1 .  cetirizine (ZYRTEC) 10 MG tablet, Take 10 mg by mouth daily., Disp: , Rfl:  .  estradiol (ESTRACE) 1 MG tablet, Take 1 tablet (1 mg total) by mouth daily., Disp: 90 tablet, Rfl: 1 .  etodolac (LODINE) 400 MG tablet, Take 400 mg by mouth 2 (two) times daily as needed for moderate pain. , Disp: , Rfl:  .  FLUoxetine (PROZAC) 40 MG capsule,  TAKE 1 CAPSULE (40 MG TOTAL) BY MOUTH DAILY., Disp: 90 capsule, Rfl: 0 .  fluticasone (FLONASE) 50 MCG/ACT nasal spray, Place 2 sprays into both nostrils daily., Disp: 16 g, Rfl: 0 .  hydrochlorothiazide (HYDRODIURIL) 12.5 MG tablet, Take 1 tablet (12.5 mg total) by mouth daily., Disp: 90 tablet, Rfl: 1 .  hydroxychloroquine (PLAQUENIL) 200 MG tablet, Take 200 mg by mouth 2 (two) times daily., Disp: , Rfl:  .  metoprolol succinate (TOPROL-XL) 50 MG 24 hr tablet, Take 1 tablet (50 mg total) by mouth daily. Take with or immediately following a meal., Disp: 90 tablet, Rfl: 1 .  pravastatin (PRAVACHOL) 40 MG tablet, Take 1 tablet (40 mg total) by mouth daily., Disp: 90 tablet, Rfl: 1  Allergies  Allergen Reactions  . Hydroxychloroquine Itching    Pt take zyrtec to alleviate symptoms   . Penicillin G Hives    Has patient had a PCN  reaction causing immediate rash, facial/tongue/throat swelling, SOB or lightheadedness with hypotension:unsure Has patient had a PCN reaction causing severe rash involving mucus membranes or skin necrosis:unsure Has patient had a PCN reaction that required hospitalization:unsure Has patient had a PCN reaction occurring within the last 10 years:No If all of the above answers are "NO", then may proceed with Cephalosporin use.      Review of Systems  Constitutional: Negative for fatigue.  Neurological: Negative for headaches.  Psychiatric/Behavioral: Positive for depression. Negative for decreased concentration.     Objective  Vitals:   07/28/16 1533  BP: 126/76  Pulse: 84  Resp: 16  Temp: 98.3 F (36.8 C)  TempSrc: Oral  SpO2: 96%  Weight: 186 lb (84.4 kg)  Height: 5\' 2"  (1.575 m)    Physical Exam  Constitutional: She is oriented to person, place, and time and well-developed, well-nourished, and in no distress.  HENT:  Head: Normocephalic and atraumatic.  Cardiovascular: Normal rate, regular rhythm and normal heart sounds.   No murmur heard. Pulmonary/Chest: Effort normal and breath sounds normal. She has no wheezes.  Neurological: She is alert and oriented to person, place, and time.  Psychiatric: Mood, memory, affect and judgment normal.  Nursing note and vitals reviewed.        Assessment & Plan  1. Anxiety and depression Stable on SSRI, refills provided - FLUoxetine (PROZAC) 40 MG capsule; Take 1 capsule (40 mg total) by mouth daily.  Dispense: 90 capsule; Refill: 0   Wilfrido Luedke Asad A. [facility] 07/28/2016 4:01 PM

## 2016-08-13 ENCOUNTER — Other Ambulatory Visit: Payer: Self-pay | Admitting: Family Medicine

## 2016-08-13 DIAGNOSIS — I10 Essential (primary) hypertension: Secondary | ICD-10-CM

## 2016-09-10 ENCOUNTER — Encounter: Payer: 59 | Admitting: Family Medicine

## 2016-09-23 ENCOUNTER — Encounter: Payer: Self-pay | Admitting: Family Medicine

## 2016-09-23 ENCOUNTER — Ambulatory Visit (INDEPENDENT_AMBULATORY_CARE_PROVIDER_SITE_OTHER): Payer: 59 | Admitting: Family Medicine

## 2016-09-23 VITALS — BP 123/75 | HR 74 | Temp 98.1°F | Resp 16 | Ht 62.0 in | Wt 185.4 lb

## 2016-09-23 DIAGNOSIS — Z1159 Encounter for screening for other viral diseases: Secondary | ICD-10-CM | POA: Diagnosis not present

## 2016-09-23 DIAGNOSIS — Z0000 Encounter for general adult medical examination without abnormal findings: Secondary | ICD-10-CM | POA: Diagnosis not present

## 2016-09-23 DIAGNOSIS — I10 Essential (primary) hypertension: Secondary | ICD-10-CM | POA: Diagnosis not present

## 2016-09-23 MED ORDER — AMLODIPINE BESYLATE 10 MG PO TABS: 10.0000 mg | ORAL_TABLET | Freq: Every day | ORAL | 1 refills | Status: DC

## 2016-09-23 NOTE — Progress Notes (Signed)
Name: Nichole Wells   MRN: [id number]    DOB: [birth year]   Date:09/23/2016       Progress Note  Subjective  Chief Complaint  Chief Complaint  Patient presents with  . Annual Exam    CPE    HPI  Pt. presents for Complete Physical Exam. She had a total hysterectomy in December 2017. Pap smear no longer indicated.  Her first Cologuard was negative in October 2017. She is not due for mammogram until November 2018. She is due for Hepatitis C screening.    Past Medical History:  Diagnosis Date  . Anxiety   . Arthritis    poss ra  . GERD (gastroesophageal reflux disease)    occ  . Heart murmur   . Hypertension   . Vitamin D deficiency     Past Surgical History:  Procedure Laterality Date  . BREAST BIOPSY Left    stereo- neg  . BREAST CYST ASPIRATION Right    neg  . BREAST SURGERY     benign biopsy  . CHOLECYSTECTOMY    . DILATION AND CURETTAGE OF UTERUS    . LAPAROSCOPIC LYSIS OF ADHESIONS  12/10/2015   Procedure: LAPAROSCOPIC LYSIS OF ADHESIONS;  Surgeon: Brayton Mars, MD;  Location: [facility];  Service: Gynecology;;  . LAPAROSCOPIC VAGINAL HYSTERECTOMY WITH SALPINGO OOPHORECTOMY Bilateral 01/28/2016   Procedure: LAPAROSCOPIC ASSISTED VAGINAL HYSTERECTOMY WITH SALPINGO OOPHORECTOMY;  Surgeon: Brayton Mars, MD;  Location: [facility];  Service: Gynecology;  Laterality: Bilateral;  . LAPAROSCOPY N/A 12/10/2015   Procedure: LAPAROSCOPY DIAGNOSTIC WITH BIOPSIES;  Surgeon: Brayton Mars, MD;  Location: [facility];  Service: Gynecology;  Laterality: N/A;  . SHOULDER ARTHROSCOPY Right   . TUBAL LIGATION    . UTERINE FIBROID EMBOLIZATION      Family History  Problem Relation Age of Onset  . Cancer Mother        cervical, lung  . Heart disease Father   . Hypertension Father   . Breast cancer Maternal Aunt   . Ovarian cancer Maternal Aunt   . Diabetes Neg Hx     Social History   Social History  . Marital status: Married    Spouse name: N/A  .  Number of children: N/A  . Years of education: N/A   Occupational History  . Not on file.   Social History Main Topics  . Smoking status: Current Every Day Smoker    Packs/day: 0.50    Years: 20.00    Types: Cigarettes  . Smokeless tobacco: Never Used  . Alcohol use 0.0 oz/week     Comment: Occasional  . Drug use: No  . Sexual activity: Yes    Birth control/ protection: Surgical     Comment: occasional   Other Topics Concern  . Not on file   Social History Narrative  . No narrative on file     Current Outpatient Prescriptions:  .  amLODipine (NORVASC) 10 MG tablet, TAKE 1 TABLET (10 MG TOTAL) BY MOUTH DAILY., Disp: 90 tablet, Rfl: 1 .  cetirizine (ZYRTEC) 10 MG tablet, Take 10 mg by mouth daily., Disp: , Rfl:  .  estradiol (ESTRACE) 1 MG tablet, Take 1 tablet (1 mg total) by mouth daily., Disp: 90 tablet, Rfl: 1 .  etodolac (LODINE) 400 MG tablet, Take 400 mg by mouth 2 (two) times daily as needed for moderate pain. , Disp: , Rfl:  .  FLUoxetine (PROZAC) 40 MG capsule, Take 1 capsule (40 mg total) by mouth daily.,  Disp: 90 capsule, Rfl: 0 .  fluticasone (FLONASE) 50 MCG/ACT nasal spray, Place 2 sprays into both nostrils daily., Disp: 16 g, Rfl: 0 .  hydrochlorothiazide (HYDRODIURIL) 12.5 MG tablet, Take 1 tablet (12.5 mg total) by mouth daily., Disp: 90 tablet, Rfl: 1 .  hydroxychloroquine (PLAQUENIL) 200 MG tablet, Take 200 mg by mouth 2 (two) times daily., Disp: , Rfl:  .  metoprolol succinate (TOPROL-XL) 50 MG 24 hr tablet, Take 1 tablet (50 mg total) by mouth daily. Take with or immediately following a meal., Disp: 90 tablet, Rfl: 1 .  pravastatin (PRAVACHOL) 40 MG tablet, Take 1 tablet (40 mg total) by mouth daily., Disp: 90 tablet, Rfl: 1  Allergies  Allergen Reactions  . Hydroxychloroquine Itching    Pt take zyrtec to alleviate symptoms   . Penicillin G Hives    Has patient had a PCN reaction causing immediate rash, facial/tongue/throat swelling, SOB or  lightheadedness with hypotension:unsure Has patient had a PCN reaction causing severe rash involving mucus membranes or skin necrosis:unsure Has patient had a PCN reaction that required hospitalization:unsure Has patient had a PCN reaction occurring within the last 10 years:No If all of the above answers are "NO", then may proceed with Cephalosporin use.      Review of Systems  Constitutional: Negative for chills, fever and malaise/fatigue.  HENT: Negative for congestion, ear pain and sore throat.   Eyes: Negative for blurred vision and double vision.  Respiratory: Negative for cough and shortness of breath.   Cardiovascular: Negative for chest pain and leg swelling.  Gastrointestinal: Negative for abdominal pain, blood in stool, diarrhea, nausea and vomiting.  Genitourinary: Negative for dysuria and hematuria.  Musculoskeletal: Negative for back pain and neck pain.  Neurological: Negative for headaches.  Psychiatric/Behavioral: Negative for depression. The patient is not nervous/anxious and does not have insomnia.      Objective  Vitals:   09/23/16 1343  BP: 123/75  Pulse: 74  Resp: 16  Temp: 98.1 F (36.7 C)  TempSrc: Oral  SpO2: 96%  Weight: 185 lb 6.4 oz (84.1 kg)  Height: 5\' 2"  (1.575 m)    Physical Exam  Constitutional: She is oriented to person, place, and time and well-developed, well-nourished, and in no distress.  HENT:  Head: Normocephalic and atraumatic.  Right Ear: External ear normal.  Left Ear: External ear normal.  Mouth/Throat: Oropharynx is clear and moist.  Eyes: Conjunctivae are normal.  Neck: Neck supple. No thyromegaly present.  Cardiovascular: Normal rate, regular rhythm and normal heart sounds.   No murmur heard. Pulmonary/Chest: Effort normal and breath sounds normal. She has no wheezes.  Abdominal: Soft. Bowel sounds are normal. There is no tenderness.  Musculoskeletal: Normal range of motion. She exhibits no edema.  Neurological: She is  alert and oriented to person, place, and time.  Psychiatric: Mood, memory, affect and judgment normal.  Nursing note and vitals reviewed.      Assessment & Plan  1. Well woman exam without gynecological exam Completed age-appropriate development exam, patient follows up with gynecology  2. Need for hepatitis C screening test  - Hepatitis C antibody  3. Essential hypertension She has not been able to obtain antihypertensive medications through her pharmacy, sent another refill for amlodipine at the request of the patient. - amLODipine (NORVASC) 10 MG tablet; Take 1 tablet (10 mg total) by mouth daily.  Dispense: 90 tablet; Refill: 1   Katieann Hungate Asad A. [facility] 09/23/2016 2:19 PM

## 2016-09-24 LAB — HEPATITIS C ANTIBODY: HCV Ab: NONREACTIVE

## 2016-10-23 ENCOUNTER — Other Ambulatory Visit: Payer: Self-pay | Admitting: Family Medicine

## 2016-10-23 DIAGNOSIS — F419 Anxiety disorder, unspecified: Principal | ICD-10-CM

## 2016-10-23 DIAGNOSIS — F329 Major depressive disorder, single episode, unspecified: Secondary | ICD-10-CM

## 2016-11-04 ENCOUNTER — Other Ambulatory Visit: Payer: Self-pay | Admitting: Family Medicine

## 2016-11-04 ENCOUNTER — Other Ambulatory Visit: Payer: Self-pay | Admitting: Obstetrics and Gynecology

## 2016-11-04 DIAGNOSIS — I10 Essential (primary) hypertension: Secondary | ICD-10-CM

## 2016-11-04 NOTE — Telephone Encounter (Signed)
Medication has been refilled and [facility] [address] 

## 2016-11-18 ENCOUNTER — Other Ambulatory Visit: Payer: Self-pay | Admitting: Family Medicine

## 2016-11-18 DIAGNOSIS — I10 Essential (primary) hypertension: Secondary | ICD-10-CM

## 2016-11-18 DIAGNOSIS — E7800 Pure hypercholesterolemia, unspecified: Secondary | ICD-10-CM

## 2016-12-02 ENCOUNTER — Telehealth: Payer: Self-pay | Admitting: Family Medicine

## 2016-12-02 NOTE — Telephone Encounter (Signed)
errenous °

## 2016-12-13 ENCOUNTER — Other Ambulatory Visit: Payer: Self-pay | Admitting: Family Medicine

## 2016-12-13 DIAGNOSIS — I10 Essential (primary) hypertension: Secondary | ICD-10-CM

## 2017-01-20 ENCOUNTER — Ambulatory Visit: Payer: 59 | Admitting: Family Medicine

## 2017-01-20 ENCOUNTER — Encounter: Payer: Self-pay | Admitting: Family Medicine

## 2017-01-20 VITALS — BP 130/74 | HR 78 | Temp 98.6°F | Resp 16 | Wt 185.6 lb

## 2017-01-20 DIAGNOSIS — J0101 Acute recurrent maxillary sinusitis: Secondary | ICD-10-CM

## 2017-01-20 DIAGNOSIS — Z880 Allergy status to penicillin: Secondary | ICD-10-CM | POA: Diagnosis not present

## 2017-01-20 MED ORDER — DOXYCYCLINE HYCLATE 100 MG PO TABS: 100.0000 mg | ORAL_TABLET | Freq: Two times a day (BID) | ORAL | 0 refills | Status: AC

## 2017-01-20 MED ORDER — PROMETHAZINE HCL 25 MG PO TABS: 12.5000 mg | ORAL_TABLET | Freq: Three times a day (TID) | ORAL | 0 refills | Status: DC | PRN

## 2017-01-20 NOTE — Patient Instructions (Addendum)
Out of work tomorrow Try vitamin C (orange juice if not diabetic or vitamin C tablets) and drink green tea to help your immune system during your illness Get plenty of rest and hydration Please do eat yogurt or kimchi or take a probiotic daily for the next month We want to replace the healthy germs in the gut If you notice foul, watery diarrhea in the next two months, schedule an appointment RIGHT AWAY or go to an urgent care or the emergency room if a holiday or over a weekend   Sinusitis, Adult Sinusitis is soreness and inflammation of your sinuses. Sinuses are hollow spaces in the bones around your face. They are located:  Around your eyes.  In the middle of your forehead.  Behind your nose.  In your cheekbones.  Your sinuses and nasal passages are lined with a stringy fluid (mucus). Mucus normally drains out of your sinuses. When your nasal tissues get inflamed or swollen, the mucus can get trapped or blocked so air cannot flow through your sinuses. This lets bacteria, viruses, and funguses grow, and that leads to infection. Follow these instructions at home: Medicines  Take, use, or apply over-the-counter and prescription medicines only as told by your doctor. These may include nasal sprays.  If you were prescribed an antibiotic medicine, take it as told by your doctor. Do not stop taking the antibiotic even if you start to feel better. Hydrate and Humidify  Drink enough water to keep your pee (urine) clear or pale yellow.  Use a cool mist humidifier to keep the humidity level in your home above 50%.  Breathe in steam for 10-15 minutes, 3-4 times a day or as told by your doctor. You can do this in the bathroom while a hot shower is running.  Try not to spend time in cool or dry air. Rest  Rest as much as possible.  Sleep with your head raised (elevated).  Make sure to get enough sleep each night. General instructions  Put a warm, moist washcloth on your face 3-4 times  a day or as told by your doctor. This will help with discomfort.  Wash your hands often with soap and water. If there is no soap and water, use hand sanitizer.  Do not smoke. Avoid being around people who are smoking (secondhand smoke).  Keep all follow-up visits as told by your doctor. This is important. Contact a doctor if:  You have a fever.  Your symptoms get worse.  Your symptoms do not get better within 10 days. Get help right away if:  You have a very bad headache.  You cannot stop throwing up (vomiting).  You have pain or swelling around your face or eyes.  You have trouble seeing.  You feel confused.  Your neck is stiff.  You have trouble breathing. This information is not intended to replace advice given to you by your health care provider. Make sure you discuss any questions you have with your health care provider. Document Released: 07/16/2007 Document Revised: 09/23/2015 Document Reviewed: 11/22/2014 Elsevier Interactive Patient Education  [employer].

## 2017-01-20 NOTE — Progress Notes (Signed)
BP 130/74   Pulse 78   Temp 98.6 F (37 C) (Oral)   Resp 16   Wt 185 lb 9.6 oz (84.2 kg)   LMP 12/24/2015 (Approximate)   SpO2 97%   BMI 33.95 kg/m    Subjective:    Patient ID: Nichole Wells, female    DOB: [birth year], 56 y.o.   MRN: [id number]  HPI: Nichole Wells is a 56 y.o. female  Chief Complaint  Patient presents with  . URI    several weeks, symptoms include sinus pressure, aches, cough    HPI Patient is here for an acute visit She has a lot of sinus problems; head hurts, above and below her eyes Coughing; bringing up yellow phlegm; not settled in the chest yet Not sure about fevers; does not really run fevers usually Has had sinus infections Nothing tastes rights Ears are clogged like muffled No rash No travel She is using saline, mucinex   Depression screen [facility] 2/9 01/20/2017 09/23/2016 07/28/2016 05/28/2016 04/16/2016  Decreased Interest 0 0 0 0 0  Down, Depressed, Hopeless 0 0 0 0 0  PHQ - 2 Score 0 0 0 0 0    Relevant past medical, surgical, family and social history reviewed Past Medical History:  Diagnosis Date  . Anxiety   . Arthritis    poss ra  . GERD (gastroesophageal reflux disease)    occ  . Heart murmur   . Hypertension   . Vitamin D deficiency    Past Surgical History:  Procedure Laterality Date  . BREAST BIOPSY Left    stereo- neg  . BREAST CYST ASPIRATION Right    neg  . BREAST SURGERY     benign biopsy  . CHOLECYSTECTOMY    . DILATION AND CURETTAGE OF UTERUS    . LAPAROSCOPIC LYSIS OF ADHESIONS  12/10/2015   Procedure: LAPAROSCOPIC LYSIS OF ADHESIONS;  Surgeon: Brayton Mars, MD;  Location: [facility] ORS;  Service: Gynecology;;  . LAPAROSCOPIC VAGINAL HYSTERECTOMY WITH SALPINGO OOPHORECTOMY Bilateral 01/28/2016   Procedure: LAPAROSCOPIC ASSISTED VAGINAL HYSTERECTOMY WITH SALPINGO OOPHORECTOMY;  Surgeon: Brayton Mars, MD;  Location: [facility] OR[facility];  Service: Gynecology;  Laterality: Bilateral;  . LAPAROSCOPY N/A 12/10/2015     Procedure: LAPAROSCOPY DIAGNOSTIC WITH BIOPSIES;  Surgeon: Brayton Mars, MD;  Location: [facility] OR[facility];  Service: Gynecology;  Laterality: N/A;  . SHOULDER ARTHROSCOPY Right   . TUBAL LIGATION    . UTERINE FIBROID EMBOLIZATION     Family History  Problem Relation Age of Onset  . Cancer Mother        cervical, lung  . Lung cancer Mother   . Heart disease Father   . Hypertension Father   . COPD Father   . Breast cancer Maternal Aunt   . Ovarian cancer Maternal Aunt   . Spina bifida Sister   . Diabetes Son   . Diabetes Maternal Grandmother   . Heart disease Maternal Grandmother   . Parkinson's disease Paternal Grandfather    Social History   Tobacco Use  . Smoking status: Current Every Day Smoker    Packs/day: 0.50    Years: 20.00    Pack years: 10.00    Types: Cigarettes  . Smokeless tobacco: Never Used  Substance Use Topics  . Alcohol use: Yes    Alcohol/week: 0.0 oz    Comment: Occasional  . Drug use: No    Interim medical history since last visit reviewed. Allergies and medications reviewed  Review of Systems Per HPI unless  specifically indicated above     Objective:    BP 130/74   Pulse 78   Temp 98.6 F (37 C) (Oral)   Resp 16   Wt 185 lb 9.6 oz (84.2 kg)   LMP 12/24/2015 (Approximate)   SpO2 97%   BMI 33.95 kg/m   Wt Readings from Last 3 Encounters:  01/20/17 185 lb 9.6 oz (84.2 kg)  09/23/16 185 lb 6.4 oz (84.1 kg)  07/28/16 186 lb (84.4 kg)    Physical Exam  Constitutional: She appears well-developed and well-nourished.  HENT:  Right Ear: Tympanic membrane and ear canal normal.  Left Ear: Tympanic membrane and ear canal normal.  Nose: Mucosal edema and rhinorrhea present. Right sinus exhibits maxillary sinus tenderness and frontal sinus tenderness. Left sinus exhibits maxillary sinus tenderness and frontal sinus tenderness.  Mouth/Throat: Mucous membranes are normal. Mucous membranes are not dry. No oropharyngeal exudate, posterior  oropharyngeal edema or posterior oropharyngeal erythema.  Scant dried cerumen in LEFT canal  Eyes: EOM are normal. No scleral icterus.  Cardiovascular: Normal rate and regular rhythm.  Pulmonary/Chest: Effort normal and breath sounds normal.  Lymphadenopathy:    She has no cervical adenopathy.  Skin: She is not diaphoretic. No pallor.  Psychiatric: She has a normal mood and affect. Her behavior is normal.    Results for orders placed or performed in visit on 09/23/16  Hepatitis C antibody  Result Value Ref Range   HCV Ab NON-REACTIVE NON-REACTIVE      Assessment & Plan:   Problem List Items Addressed This Visit    None    Visit Diagnoses    Acute recurrent maxillary sinusitis    -  Primary   reviewed up-to-date; will use doxy; rest, hydration, out of work tomorrow; risk of C diff in AVS, try yogurt or kimchi or probiotics   Relevant Medications   doxycycline (VIBRA-TABS) 100 MG tablet   promethazine (PHENERGAN) 25 MG tablet   Penicillin allergy       front line treatment is augmentin; resistance to zpaks explained; will treat with doxy       Follow up plan: No Follow-up on file.  An after-visit summary was printed and given to the patient at [place].  Please see the patient instructions which may contain other information and recommendations beyond what is mentioned above in the assessment and plan.  Meds ordered this encounter  Medications  . doxycycline (VIBRA-TABS) 100 MG tablet    Sig: Take 1 tablet (100 mg total) by mouth 2 (two) times daily for 10 days.    Dispense:  20 tablet    Refill:  0  . promethazine (PHENERGAN) 25 MG tablet    Sig: Take 0.5-1 tablets (12.5-25 mg total) by mouth every 8 (eight) hours as needed for nausea or vomiting.    Dispense:  20 tablet    Refill:  0    No orders of the defined types were placed in this encounter.

## 2017-01-28 ENCOUNTER — Other Ambulatory Visit: Payer: Self-pay | Admitting: Family Medicine

## 2017-01-28 DIAGNOSIS — F329 Major depressive disorder, single episode, unspecified: Secondary | ICD-10-CM

## 2017-01-28 DIAGNOSIS — F419 Anxiety disorder, unspecified: Principal | ICD-10-CM

## 2017-01-28 DIAGNOSIS — F32A Depression, unspecified: Secondary | ICD-10-CM

## 2017-04-29 ENCOUNTER — Other Ambulatory Visit: Payer: Self-pay | Admitting: Obstetrics and Gynecology

## 2017-04-30 ENCOUNTER — Other Ambulatory Visit: Payer: Self-pay

## 2017-04-30 DIAGNOSIS — F32A Depression, unspecified: Secondary | ICD-10-CM

## 2017-04-30 DIAGNOSIS — F419 Anxiety disorder, unspecified: Principal | ICD-10-CM

## 2017-04-30 DIAGNOSIS — F329 Major depressive disorder, single episode, unspecified: Secondary | ICD-10-CM

## 2017-04-30 NOTE — Telephone Encounter (Signed)
90 day supply

## 2017-04-30 NOTE — Telephone Encounter (Signed)
Not seen since Dec ( acute visit) , needs to be seen, we can give just emergency supply until her next appointment

## 2017-05-01 NOTE — Telephone Encounter (Signed)
LVM for pt to call the office for an appt.

## 2017-05-03 MED ORDER — FLUOXETINE HCL 40 MG PO CAPS: 40.0000 mg | ORAL_CAPSULE | Freq: Every day | ORAL | 0 refills | Status: DC

## 2017-05-04 ENCOUNTER — Encounter: Payer: Self-pay | Admitting: Family Medicine

## 2017-05-04 ENCOUNTER — Other Ambulatory Visit: Payer: Self-pay | Admitting: Obstetrics and Gynecology

## 2017-05-04 ENCOUNTER — Other Ambulatory Visit: Payer: Self-pay | Admitting: Family Medicine

## 2017-05-04 DIAGNOSIS — Z1231 Encounter for screening mammogram for malignant neoplasm of breast: Secondary | ICD-10-CM

## 2017-05-07 ENCOUNTER — Encounter: Payer: Self-pay | Admitting: Family Medicine

## 2017-05-07 ENCOUNTER — Ambulatory Visit: Payer: 59 | Admitting: Family Medicine

## 2017-05-07 VITALS — BP 122/72 | HR 73 | Resp 14 | Ht 62.0 in | Wt 181.1 lb

## 2017-05-07 DIAGNOSIS — M1380 Other specified arthritis, unspecified site: Secondary | ICD-10-CM | POA: Insufficient documentation

## 2017-05-07 DIAGNOSIS — Z720 Tobacco use: Secondary | ICD-10-CM | POA: Diagnosis not present

## 2017-05-07 DIAGNOSIS — F329 Major depressive disorder, single episode, unspecified: Secondary | ICD-10-CM

## 2017-05-07 DIAGNOSIS — J310 Chronic rhinitis: Secondary | ICD-10-CM | POA: Insufficient documentation

## 2017-05-07 DIAGNOSIS — E7800 Pure hypercholesterolemia, unspecified: Secondary | ICD-10-CM | POA: Diagnosis not present

## 2017-05-07 DIAGNOSIS — E559 Vitamin D deficiency, unspecified: Secondary | ICD-10-CM | POA: Diagnosis not present

## 2017-05-07 DIAGNOSIS — I10 Essential (primary) hypertension: Secondary | ICD-10-CM

## 2017-05-07 DIAGNOSIS — F3340 Major depressive disorder, recurrent, in remission, unspecified: Secondary | ICD-10-CM | POA: Diagnosis not present

## 2017-05-07 DIAGNOSIS — M1990 Unspecified osteoarthritis, unspecified site: Secondary | ICD-10-CM | POA: Insufficient documentation

## 2017-05-07 DIAGNOSIS — F419 Anxiety disorder, unspecified: Secondary | ICD-10-CM

## 2017-05-07 DIAGNOSIS — R739 Hyperglycemia, unspecified: Secondary | ICD-10-CM | POA: Diagnosis not present

## 2017-05-07 MED ORDER — METOPROLOL SUCCINATE ER 50 MG PO TB24: 50.0000 mg | ORAL_TABLET | Freq: Every day | ORAL | 1 refills | Status: DC

## 2017-05-07 MED ORDER — FLUOXETINE HCL 40 MG PO CAPS: 40.0000 mg | ORAL_CAPSULE | Freq: Every day | ORAL | 1 refills | Status: DC

## 2017-05-07 MED ORDER — PRAVASTATIN SODIUM 40 MG PO TABS: 40.0000 mg | ORAL_TABLET | Freq: Every day | ORAL | 1 refills | Status: DC

## 2017-05-07 MED ORDER — HYDROCHLOROTHIAZIDE 12.5 MG PO TABS: 12.5000 mg | ORAL_TABLET | Freq: Every day | ORAL | 1 refills | Status: DC

## 2017-05-07 MED ORDER — AMLODIPINE BESYLATE 10 MG PO TABS: 10.0000 mg | ORAL_TABLET | Freq: Every day | ORAL | 1 refills | Status: DC

## 2017-05-07 NOTE — Progress Notes (Signed)
Name: Nichole Wells   MRN: [id number]    DOB: 11/11/1960   Date:05/07/2017       Progress Note  Subjective  Chief Complaint  Chief Complaint  Patient presents with  . Follow-up    HPI  HTN: taking medication daily, no side effects , no chest pain, but has occasional palpitation, however toprol XL helps with that.   Inflammatory arthritis: seeing Dr. Jefm Bryant, states currently only on topical medication, occasional aleve and methotrexate weekly. Having a flare today on her hands, tender, warm and red  Hyperlipidemia: she has not been compliant with Pravastatin but will resume it, she does not like taking medication because it causes myalgia advised to try taking Co-Q 10 and if no improvement we can try Livalo  Major Depression: she was diagnosed in her 46's, she has been taking Prozac for many years, tried other medication in the past. Initially seen by psychiatrist. Doing well on current regiment and does not want to change  Hyperglycemia: she denies polyphagia, polydipsia or polyuria.   Patient Active Problem List   Diagnosis Date Noted  . Inflammatory arthritis 05/07/2017  . Inflamed nasal mucosa 05/07/2017  . Current tobacco use 05/07/2017  . Vitamin D deficiency 05/28/2016  . Surgical menopause on hormone replacement therapy 04/23/2016  . Status post laparoscopic assisted vaginal hysterectomy (LAVH) 01/28/2016  . Family history of ovarian cancer 11/13/2015  . Insomnia 03/27/2015  . Hypertension 08/30/2014  . Anxiety and depression 08/30/2014  . Cardiac murmur 08/30/2014  . HLD (hyperlipidemia) 08/30/2014  . DDD (degenerative disc disease), lumbar 07/26/2014    Past Surgical History:  Procedure Laterality Date  . BREAST BIOPSY Left    stereo- neg  . BREAST CYST ASPIRATION Right    neg  . BREAST SURGERY     benign biopsy  . CHOLECYSTECTOMY    . DILATION AND CURETTAGE OF UTERUS    . LAPAROSCOPIC LYSIS OF ADHESIONS  12/10/2015   Procedure: LAPAROSCOPIC LYSIS OF  ADHESIONS;  Surgeon: Brayton Mars, MD;  Location: [facility];  Service: Gynecology;;  . LAPAROSCOPIC VAGINAL HYSTERECTOMY WITH SALPINGO OOPHORECTOMY Bilateral 01/28/2016   Procedure: LAPAROSCOPIC ASSISTED VAGINAL HYSTERECTOMY WITH SALPINGO OOPHORECTOMY;  Surgeon: Brayton Mars, MD;  Location: [facility];  Service: Gynecology;  Laterality: Bilateral;  . LAPAROSCOPY N/A 12/10/2015   Procedure: LAPAROSCOPY DIAGNOSTIC WITH BIOPSIES;  Surgeon: Brayton Mars, MD;  Location: [facility] ORS;  Service: Gynecology;  Laterality: N/A;  . SHOULDER ARTHROSCOPY Right   . TUBAL LIGATION    . UTERINE FIBROID EMBOLIZATION      Family History  Problem Relation Age of Onset  . Cancer Mother        cervical, lung  . Lung cancer Mother   . Heart disease Father   . Hypertension Father   . COPD Father   . Breast cancer Maternal Aunt   . Ovarian cancer Maternal Aunt   . Spina bifida Sister   . Diabetes Son   . Diabetes Maternal Grandmother   . Heart disease Maternal Grandmother   . Parkinson's disease Paternal Grandfather     Social History   Socioeconomic History  . Marital status: Married    Spouse name: [name]   . Number of children: 2  . Years of education: Not on file  . Highest education level: Some college, no degree  Occupational History  . Occupation: Freight forwarder  Social Needs  . Financial resource strain: Not on file  . Food insecurity:    Worry: Not on file  Inability: Not on file  . Transportation needs:    Medical: Not on file    Non-medical: Not on file  Tobacco Use  . Smoking status: Current Every Day Smoker    Packs/day: 0.50    Years: 30.00    Pack years: 15.00    Types: Cigarettes  . Smokeless tobacco: Never Used  Substance and Sexual Activity  . Alcohol use: Yes    Alcohol/week: 0.0 oz    Comment: Occasional  . Drug use: No  . Sexual activity: Yes    Partners: Male    Birth control/protection: Surgical  Lifestyle  . Physical activity:    Days per week:  0 days    Minutes per session: 0 min  . Stress: To some extent  Relationships  . Social connections:    Talks on phone: Not on file    Gets together: Not on file    Attends religious service: Not on file    Active member of club or organization: Not on file    Attends meetings of clubs or organizations: Not on file    Relationship status: Not on file  . Intimate partner violence:    Fear of current or ex partner: Not on file    Emotionally abused: Not on file    Physically abused: Not on file    Forced sexual activity: Not on file  Other Topics Concern  . Not on file  Social History Narrative   Married   Works full time   Has 2 grown children      Current Outpatient Medications:  .  amLODipine (NORVASC) 10 MG tablet, Take 1 tablet (10 mg total) by mouth daily., Disp: 90 tablet, Rfl: 1 .  cetirizine (ZYRTEC) 10 MG tablet, Take 10 mg by mouth daily., Disp: , Rfl:  .  diclofenac sodium (VOLTAREN) 1 % GEL, Apply 1 application topically as needed., Disp: , Rfl:  .  estradiol (ESTRACE) 1 MG tablet, TAKE 1 TABLET BY MOUTH EVERY DAY, Disp: 90 tablet, Rfl: 0 .  FLUoxetine (PROZAC) 40 MG capsule, Take 1 capsule (40 mg total) by mouth daily., Disp: 90 capsule, Rfl: 1 .  fluticasone (FLONASE) 50 MCG/ACT nasal spray, Place 2 sprays into both nostrils daily., Disp: 16 g, Rfl: 0 .  folic acid (FOLVITE) 1 MG tablet, Take 1 mg by mouth daily., Disp: , Rfl:  .  gabapentin (NEURONTIN) 100 MG capsule, Take 100 mg by mouth 3 (three) times daily., Disp: , Rfl: 11 .  hydrochlorothiazide (HYDRODIURIL) 12.5 MG tablet, Take 1 tablet (12.5 mg total) by mouth daily., Disp: 90 tablet, Rfl: 1 .  methotrexate (RHEUMATREX) 2.5 MG tablet, Take 15 mg by mouth every 7 (seven) days., Disp: , Rfl: 1 .  metoprolol succinate (TOPROL-XL) 50 MG 24 hr tablet, Take 1 tablet (50 mg total) by mouth daily. Take with or immediately following a meal., Disp: 90 tablet, Rfl: 1 .  pravastatin (PRAVACHOL) 40 MG tablet, Take 1  tablet (40 mg total) by mouth daily., Disp: 90 tablet, Rfl: 1  Allergies  Allergen Reactions  . Hydroxychloroquine Itching    Pt take zyrtec to alleviate symptoms   . Penicillin G Hives    Has patient had a PCN reaction causing immediate rash, facial/tongue/throat swelling, SOB or lightheadedness with hypotension:unsure Has patient had a PCN reaction causing severe rash involving mucus membranes or skin necrosis:unsure Has patient had a PCN reaction that required hospitalization:unsure Has patient had a PCN reaction occurring within the last 10 years:No  If all of the above answers are "NO", then may proceed with Cephalosporin use.      ROS  Constitutional: Negative for fever ,for mild  weight change.  Respiratory: Negative for cough and shortness of breath.   Cardiovascular: Negative for chest pain , occasional palpitations.  Gastrointestinal: Negative for abdominal pain, no bowel changes.  Musculoskeletal: Negative for gait problem , positive for intermittent joint swelling.  Skin: Negative for rash.  Neurological: Negative for dizziness or headache.  No other specific complaints in a complete review of systems (except as listed in HPI above).  Objective  Vitals:   05/07/17 1046  BP: 122/72  Pulse: 73  Resp: 14  SpO2: 98%  Weight: 181 lb 1.6 oz (82.1 kg)  Height: 5\' 2"  (1.575 m)    Body mass index is 33.12 kg/m.  Physical Exam  Constitutional: Patient appears well-developed and well-nourished. Obese No distress.  HEENT: head atraumatic, normocephalic, pupils equal and reactive to light, neck supple, throat within normal limits Cardiovascular: Normal rate, regular rhythm and normal heart sounds.  No murmur heard. No BLE edema. Pulmonary/Chest: Effort normal and breath sounds normal. No respiratory distress. Abdominal: Soft.  There is no tenderness. Psychiatric: Patient has a normal mood and affect. behavior is normal. Judgment and thought content normal. Muscular  skeletal: increase in warmth on both wrist, and redness on palm of her hands today, tender to touch   PHQ2/9: Depression screen [facility] 2/9 01/20/2017 09/23/2016 07/28/2016 05/28/2016 04/16/2016  Decreased Interest 0 0 0 0 0  Down, Depressed, Hopeless 0 0 0 0 0  PHQ - 2 Score 0 0 0 0 0     Fall Risk: Fall Risk  05/07/2017 01/20/2017 09/23/2016 07/28/2016 05/28/2016  Falls in the past year? No No No No No     Functional Status Survey: Is the patient deaf or have difficulty hearing?: No Does the patient have difficulty seeing, even when wearing glasses/contacts?: No Does the patient have difficulty concentrating, remembering, or making decisions?: No Does the patient have difficulty walking or climbing stairs?: No Does the patient have difficulty dressing or bathing?: No Does the patient have difficulty doing errands alone such as visiting a doctor's office or shopping?: No    Assessment & Plan  1. Essential hypertension  - amLODipine (NORVASC) 10 MG tablet; Take 1 tablet (10 mg total) by mouth daily.  Dispense: 90 tablet; Refill: 1 - hydrochlorothiazide (HYDRODIURIL) 12.5 MG tablet; Take 1 tablet (12.5 mg total) by mouth daily.  Dispense: 90 tablet; Refill: 1 - metoprolol succinate (TOPROL-XL) 50 MG 24 hr tablet; Take 1 tablet (50 mg total) by mouth daily. Take with or immediately following a meal.  Dispense: 90 tablet; Refill: 1  2. Recurrent major depressive disorder, in remission (HCC)  - FLUoxetine (PROZAC) 40 MG capsule; Take 1 capsule (40 mg total) by mouth daily.  Dispense: 90 capsule; Refill: 1  3. Pure hypercholesterolemia  - pravastatin (PRAVACHOL) 40 MG tablet; Take 1 tablet (40 mg total) by mouth daily.  Dispense: 90 tablet; Refill: 1  4. Vitamin D deficiency   5. Hyperglycemia  Recheck labs next visit    8. Tobacco use  Not ready, but discussed importance of quitting and ways to help her quit

## 2017-05-12 ENCOUNTER — Ambulatory Visit
Admission: RE | Admit: 2017-05-12 | Discharge: 2017-05-12 | Disposition: A | Discharge status: Home / Self Care | Payer: 59 | Source: Ambulatory Visit | Attending: Family Medicine | Admitting: Family Medicine

## 2017-05-12 DIAGNOSIS — Z1231 Encounter for screening mammogram for malignant neoplasm of breast: Secondary | ICD-10-CM | POA: Diagnosis not present

## 2017-07-31 ENCOUNTER — Other Ambulatory Visit: Payer: Self-pay | Admitting: Obstetrics and Gynecology

## 2017-10-05 ENCOUNTER — Ambulatory Visit (INDEPENDENT_AMBULATORY_CARE_PROVIDER_SITE_OTHER): Payer: 59 | Admitting: Nurse Practitioner

## 2017-10-05 ENCOUNTER — Ambulatory Visit
Admission: RE | Admit: 2017-10-05 | Discharge: 2017-10-05 | Disposition: A | Discharge status: Home / Self Care | Payer: 59 | Source: Ambulatory Visit | Attending: Nurse Practitioner | Admitting: Nurse Practitioner

## 2017-10-05 ENCOUNTER — Encounter: Payer: Self-pay | Admitting: Nurse Practitioner

## 2017-10-05 VITALS — BP 150/80 | HR 90 | Temp 98.3°F | Resp 16 | Ht 62.0 in | Wt 179.7 lb

## 2017-10-05 DIAGNOSIS — M7989 Other specified soft tissue disorders: Secondary | ICD-10-CM | POA: Diagnosis not present

## 2017-10-05 DIAGNOSIS — Z0000 Encounter for general adult medical examination without abnormal findings: Secondary | ICD-10-CM

## 2017-10-05 DIAGNOSIS — F17210 Nicotine dependence, cigarettes, uncomplicated: Secondary | ICD-10-CM

## 2017-10-05 DIAGNOSIS — Z23 Encounter for immunization: Secondary | ICD-10-CM | POA: Diagnosis not present

## 2017-10-05 DIAGNOSIS — E7800 Pure hypercholesterolemia, unspecified: Secondary | ICD-10-CM | POA: Diagnosis not present

## 2017-10-05 DIAGNOSIS — Z5181 Encounter for therapeutic drug level monitoring: Secondary | ICD-10-CM

## 2017-10-05 DIAGNOSIS — Z122 Encounter for screening for malignant neoplasm of respiratory organs: Secondary | ICD-10-CM

## 2017-10-05 DIAGNOSIS — I10 Essential (primary) hypertension: Secondary | ICD-10-CM

## 2017-10-05 DIAGNOSIS — F419 Anxiety disorder, unspecified: Secondary | ICD-10-CM

## 2017-10-05 DIAGNOSIS — E559 Vitamin D deficiency, unspecified: Secondary | ICD-10-CM

## 2017-10-05 DIAGNOSIS — F329 Major depressive disorder, single episode, unspecified: Secondary | ICD-10-CM

## 2017-10-05 MED ORDER — ZOSTER VAC RECOMB ADJUVANTED 50 MCG/0.5ML IM SUSR: 0.5000 mL | Freq: Once | INTRAMUSCULAR | 0 refills | Status: AC

## 2017-10-05 NOTE — Patient Instructions (Addendum)
-  150 minutes of physical activity weekly, eat two servings of fish weekly, eat one serving of tree nuts ( cashews, pistachios, pecans, almonds.Marland Kitchen) every other day, eat 6 servings of fruit/vegetables daily and drink plenty of water and avoid sweet beverages.   -Please set up appointment to meet with rheumatologist.   - You can go to the [facility] some time this week to complete the x-ray of your right hand. Use ice and heat to help with pain and swelling. If unimproved, worsening, noticing redness, heat ect. Please come in for re-evaluation  -CDC recommends that healthy adults 50 years and older get two doses of the shingles vaccine called Shingrix (recombinant zoster vaccine), separated by 2 to 6 months, to prevent shingles and the complications from the disease. Your doctor or pharmacist can give you Shingrix as a shot in your upper arm. Shingrix provides strong protection against shingles and PHN. Two doses of Shingrix is more than 90% effective at preventing shingles and PHN

## 2017-10-05 NOTE — Progress Notes (Addendum)
Name: Nichole Wells   MRN: [id number]    DOB: 20-Mar-1960   Date:10/05/2017       Progress Note  Subjective  Chief Complaint  Chief Complaint  Patient presents with  . Annual Exam    HPI   Patient presents for annual CPE. Hypertension: rx amlodipine 10mg  and HCTZ 12.5mg  and metoprolol 50mg . Hasn't taken medication today because she forgot while rushing here. Checks it at home rarely.  BP Readings from Last 3 Encounters:  10/05/17 (!) 150/80  05/07/17 122/72  01/20/17 130/74    Hyperlipidemia: was taking pravastatin 40mg  but stopped several months ago.  Lab Results  Component Value Date   CHOL 161 06/27/2016   HDL 55 06/27/2016   LDLCALC 83 06/27/2016   TRIG 114 06/27/2016   CHOLHDL 2.9 06/27/2016    Vitamin D deficiency : takes OTC vitamin D 2000 IU a day.  Anxiety & Depression: rx prozac 40mg  daily; no issues- feels like it is very well controlled. Diagnosed  With MDD in her 22's.  Inflammatory arthritis: takes methotrexate 15mg  q weekly; sees Dr. Jefm Bryant- last seen on 02/16/2017 added neurontin.  States pain in hands and feet have worsened over the past 3 weeks plans to make an appointment with him.    Right hand has small lump that popped up and started growing. States cut it on thorn 3 weeks ago and then noticed that area popped up. No fevers, chills, mildly tender to palpation, no drainage.    Diet:  Breakfast- muffins Lunch- sandwich, chips Dinner- Depends; chicken, pork not as much beef Occasionally snacks in between- usually nothing healthy Gets 0-1 fruits a day; 1 vegetable a day Drinks unsweetened tea. Avoids soda- drinks rarely.  Exercise: very active at work. Doesn't exercise outside of work.   USPSTF grade A and B recommendations    Office Visit from 10/05/2017 in [facility]  AUDIT-C Score  2     Depression:  Depression screen [facility] 2/9 10/05/2017 01/20/2017 09/23/2016 07/28/2016 05/28/2016  Decreased Interest 0 0 0 0 0  Down,  Depressed, Hopeless 0 0 0 0 0  PHQ - 2 Score 0 0 0 0 0   Hypertension: BP Readings from Last 3 Encounters:  10/05/17 (!) 150/80  05/07/17 122/72  01/20/17 130/74   Obesity: Wt Readings from Last 3 Encounters:  10/05/17 179 lb 11.2 oz (81.5 kg)  05/07/17 181 lb 1.6 oz (82.1 kg)  01/20/17 185 lb 9.6 oz (84.2 kg)   BMI Readings from Last 3 Encounters:  10/05/17 32.87 kg/m  05/07/17 33.12 kg/m  01/20/17 33.95 kg/m    Hep C Screening: negative in 2018 STD testing and prevention (HIV/chl/gon/syphilis):  declines Intimate partner violence: denies  Sexual History/Pain during Intercourse: denies Menstrual History/LMP/Abnormal Bleeding: denies  Incontinence Symptoms: rarely- incontinence- stress   Advanced Care Planning: A voluntary discussion about advance care planning including the explanation and discussion of advance directives.  Discussed health care proxy and Living will, and the patient was able to identify a health care proxy as husband Manasvini Whatley.  Patient does not have a living will at present time. If patient does have living will, I have requested they bring this to the clinic to be scanned in to their chart.  Breast cancer: last completed on 05/2017- negative  No results found for: [facility]  Cervical cancer screening: complete hysterectomy  Mom had cervical cancer.   Osteoporosis Screening: taking vitamin D; weight bearing exercise.  No results found for: HMDEXASCAN  Lipids:  Lab Results  Component Value Date   CHOL 161 06/27/2016   CHOL 166 04/10/2015   Lab Results  Component Value Date   HDL 55 06/27/2016   HDL 53 04/10/2015   Lab Results  Component Value Date   LDLCALC 83 06/27/2016   LDLCALC 93 04/10/2015   Lab Results  Component Value Date   TRIG 114 06/27/2016   TRIG 100 04/10/2015   Lab Results  Component Value Date   CHOLHDL 2.9 06/27/2016   CHOLHDL 3.1 04/10/2015   No results found for: LDLDIRECT  Glucose:  Glucose  Date Value Ref  Range Status  04/27/2012 106 (H) 65 - 99 mg/dL Final   Glucose, Bld  Date Value Ref Range Status  06/27/2016 86 65 - 99 mg/dL Final  01/23/2016 88 65 - 99 mg/dL Final  11/01/2015 92 65 - 99 mg/dL Final    Skin cancer: denies history; doesn't sunscreen  Colorectal cancer: negative cologuard in 2017  Lung cancer:  Smoked 20 cig's a day on average for 30 years- for 30 pack year history.  Low Dose CT Chest recommended if Age 54-80 years, 30 pack-year currently smoking OR have quit w/in 15years. Patient does qualify.    Patient Active Problem List   Diagnosis Date Noted  . Inflammatory arthritis 05/07/2017  . Inflamed nasal mucosa 05/07/2017  . Current tobacco use 05/07/2017  . Vitamin D deficiency 05/28/2016  . Surgical menopause on hormone replacement therapy 04/23/2016  . Status post laparoscopic assisted vaginal hysterectomy (LAVH) 01/28/2016  . Family history of ovarian cancer 11/13/2015  . Insomnia 03/27/2015  . Hypertension 08/30/2014  . Anxiety and depression 08/30/2014  . Cardiac murmur 08/30/2014  . HLD (hyperlipidemia) 08/30/2014  . DDD (degenerative disc disease), lumbar 07/26/2014    Past Surgical History:  Procedure Laterality Date  . ABDOMINAL HYSTERECTOMY    . BREAST BIOPSY Left    stereo- neg  . BREAST CYST ASPIRATION Right    neg  . BREAST SURGERY     benign biopsy  . CHOLECYSTECTOMY    . DILATION AND CURETTAGE OF UTERUS    . LAPAROSCOPIC LYSIS OF ADHESIONS  12/10/2015   Procedure: LAPAROSCOPIC LYSIS OF ADHESIONS;  Surgeon: Brayton Mars, MD;  Location: [facility] ORS;  Service: Gynecology;;  . LAPAROSCOPIC VAGINAL HYSTERECTOMY WITH SALPINGO OOPHORECTOMY Bilateral 01/28/2016   Procedure: LAPAROSCOPIC ASSISTED VAGINAL HYSTERECTOMY WITH SALPINGO OOPHORECTOMY;  Surgeon: Brayton Mars, MD;  Location: [facility] ORS;  Service: Gynecology;  Laterality: Bilateral;  . LAPAROSCOPY N/A 12/10/2015   Procedure: LAPAROSCOPY DIAGNOSTIC WITH BIOPSIES;  Surgeon: Brayton Mars, MD;  Location: [facility] ORS;  Service: Gynecology;  Laterality: N/A;  . SHOULDER ARTHROSCOPY Right   . TUBAL LIGATION    . UTERINE FIBROID EMBOLIZATION      Family History  Problem Relation Age of Onset  . Cancer Mother        cervical, lung  . Lung cancer Mother   . Heart disease Father   . Hypertension Father   . COPD Father   . Breast cancer Maternal Aunt   . Ovarian cancer Maternal Aunt   . Spina bifida Sister   . Diabetes Son   . Diabetes Maternal Grandmother   . Heart disease Maternal Grandmother   . Parkinson's disease Paternal Grandfather     Social History   Socioeconomic History  . Marital status: Married    Spouse name: [name]   . Number of children: 2  . Years of education: Not on file  .  Highest education level: Some college, no degree  Occupational History  . Occupation: Freight forwarder  Social Needs  . Financial resource strain: Not hard at all  . Food insecurity:    Worry: Never true    Inability: Never true  . Transportation needs:    Medical: No    Non-medical: No  Tobacco Use  . Smoking status: Current Every Day Smoker    Packs/day: 0.50    Years: 30.00    Pack years: 15.00    Types: Cigarettes    Start date: 10/06/1987  . Smokeless tobacco: Never Used  Substance and Sexual Activity  . Alcohol use: Yes    Alcohol/week: 0.0 standard drinks    Comment: Occasional  . Drug use: No  . Sexual activity: Yes    Partners: Male    Birth control/protection: Surgical  Lifestyle  . Physical activity:    Days per week: 5 days    Minutes per session: 60 min  . Stress: Not at all  Relationships  . Social connections:    Talks on phone: Once a week    Gets together: Never    Attends religious service: Never    Active member of club or organization: No    Attends meetings of clubs or organizations: Never    Relationship status: Married  . Intimate partner violence:    Fear of current or ex partner: No    Emotionally abused: No    Physically  abused: No    Forced sexual activity: No  Other Topics Concern  . Not on file  Social History Narrative   Married   Works full time   Has 2 grown children      Current Outpatient Medications:  .  amLODipine (NORVASC) 10 MG tablet, Take 1 tablet (10 mg total) by mouth daily., Disp: 90 tablet, Rfl: 1 .  cetirizine (ZYRTEC) 10 MG tablet, Take 10 mg by mouth daily., Disp: , Rfl:  .  FLUoxetine (PROZAC) 40 MG capsule, Take 1 capsule (40 mg total) by mouth daily., Disp: 90 capsule, Rfl: 1 .  fluticasone (FLONASE) 50 MCG/ACT nasal spray, Place 2 sprays into both nostrils daily. (Patient taking differently: Place 2 sprays into both nostrils as needed. ), Disp: 16 g, Rfl: 0 .  gabapentin (NEURONTIN) 100 MG capsule, Take 100 mg by mouth 3 (three) times daily., Disp: , Rfl: 11 .  hydrochlorothiazide (HYDRODIURIL) 12.5 MG tablet, Take 1 tablet (12.5 mg total) by mouth daily., Disp: 90 tablet, Rfl: 1 .  metoprolol succinate (TOPROL-XL) 50 MG 24 hr tablet, Take 1 tablet (50 mg total) by mouth daily. Take with or immediately following a meal., Disp: 90 tablet, Rfl: 1 .  estradiol (ESTRACE) 1 MG tablet, TAKE 1 TABLET BY MOUTH EVERY DAY (Patient not taking: Reported on 10/05/2017), Disp: 90 tablet, Rfl: 0 .  folic acid (FOLVITE) 1 MG tablet, Take 1 mg by mouth daily., Disp: , Rfl:  .  methotrexate (RHEUMATREX) 2.5 MG tablet, Take 15 mg by mouth every 7 (seven) days., Disp: , Rfl: 1 .  pravastatin (PRAVACHOL) 40 MG tablet, Take 1 tablet (40 mg total) by mouth daily. (Patient not taking: Reported on 10/05/2017), Disp: 90 tablet, Rfl: 1  Allergies  Allergen Reactions  . Hydroxychloroquine Itching    Pt take zyrtec to alleviate symptoms   . Penicillin G Hives    Has patient had a PCN reaction causing immediate rash, facial/tongue/throat swelling, SOB or lightheadedness with hypotension:unsure Has patient had a PCN reaction causing severe  rash involving mucus membranes or skin necrosis:unsure Has patient had  a PCN reaction that required hospitalization:unsure Has patient had a PCN reaction occurring within the last 10 years:No If all of the above answers are "NO", then may proceed with Cephalosporin use.      Review of Systems  Constitutional: Negative for chills, fever and weight loss.  HENT: Positive for congestion. Negative for sore throat and tinnitus.   Eyes: Negative for blurred vision and double vision.  Respiratory: Negative for cough, sputum production and shortness of breath.   Cardiovascular: Positive for palpitations (rarely- self-resolves lasts about 4 seconds). Negative for chest pain.  Gastrointestinal: Negative for abdominal pain, blood in stool, constipation, diarrhea, nausea and vomiting.  Genitourinary: Negative for dysuria and hematuria.  Musculoskeletal: Positive for back pain, joint pain, myalgias and neck pain. Negative for falls.  Neurological: Negative for dizziness, tingling, weakness and headaches.  Endo/Heme/Allergies: Positive for polydipsia. Bruises/bleeds easily.  Psychiatric/Behavioral: Negative for depression and suicidal ideas. The patient is not nervous/anxious and does not have insomnia.       Objective  Vitals:   10/05/17 0904 10/05/17 0911  BP: (!) 158/86 (!) 150/80  Pulse: 90   Resp: 16   Temp: 98.3 F (36.8 C)   TempSrc: Oral   SpO2: 96%   Weight: 179 lb 11.2 oz (81.5 kg)   Height: 5\' 2"  (1.575 m)     Body mass index is 32.87 kg/m.  Physical Exam  Constitutional: She appears well-developed and well-nourished.  HENT:  Head: Normocephalic and atraumatic.  Right Ear: Hearing, tympanic membrane, external ear and ear canal normal.  Left Ear: Hearing, tympanic membrane, external ear and ear canal normal.  Nose: Nose normal.  Mouth/Throat: Uvula is midline, oropharynx is clear and moist and mucous membranes are normal.  Eyes: Pupils are equal, round, and reactive to light. Conjunctivae and EOM are normal.  Neck: Trachea normal and normal  range of motion. Neck supple. No thyromegaly present.  Cardiovascular: Normal rate, regular rhythm and intact distal pulses.  Murmur heard. Pulmonary/Chest: Effort normal and breath sounds normal. She exhibits no mass, no tenderness and no retraction. Right breast exhibits no mass, no nipple discharge, no skin change and no tenderness. Left breast exhibits no mass, no nipple discharge, no skin change and no tenderness.  Abdominal: Soft. Bowel sounds are normal. There is no tenderness. There is no CVA tenderness.  Musculoskeletal: Normal range of motion. She exhibits no edema or deformity.  Neurological: She is alert. She has normal strength.  Reflex Scores:      Patellar reflexes are 2+ on the right side and 2+ on the left side. Skin: Skin is warm, dry and intact. No rash noted. No erythema.     Psychiatric: She has a normal mood and affect. Her speech is normal and behavior is normal. Judgment and thought content normal.  Vitals reviewed.    No results found for this or any previous visit (from the past 2160 hour(s)).    PHQ2/9: Depression screen [facility] 2/9 10/05/2017 01/20/2017 09/23/2016 07/28/2016 05/28/2016  Decreased Interest 0 0 0 0 0  Down, Depressed, Hopeless 0 0 0 0 0  PHQ - 2 Score 0 0 0 0 0     Fall Risk: Fall Risk  10/05/2017 05/07/2017 01/20/2017 09/23/2016 07/28/2016  Falls in the past year? No No No No No     Functional Status Survey: Is the patient deaf or have difficulty hearing?: No Does the patient have difficulty seeing, even when wearing  glasses/contacts?: Yes(glasses) Does the patient have difficulty concentrating, remembering, or making decisions?: No Does the patient have difficulty walking or climbing stairs?: No Does the patient have difficulty dressing or bathing?: No Does the patient have difficulty doing errands alone such as visiting a doctor's office or shopping?: No   Assessment & Plan 1. Annual physical exam Discussed, diet, exercise and  immunizations; please see patient instruction handout.  - COMPLETE METABOLIC PANEL WITH GFR  2. Essential hypertension Elevated today as she did not take her medicines; will return in 2 weeks for nurse bp check - COMPLETE METABOLIC PANEL WITH GFR  3. Pure hypercholesterolemia Discussed diet; stopped taking pravachol- will discuss returning on it based off of lipids today - Lipid Profile  4. Vitamin D deficiency Continue OTC, will monitorer - Vitamin D (25 hydroxy)  5. Anxiety and depression Continue med; stable  - COMPLETE METABOLIC PANEL WITH GFR  6. Medication monitoring encounter - COMPLETE METABOLIC PANEL WITH GFR  7. Encounter for screening for malignant neoplasm of respiratory organs - CT CHEST LUNG CA SCREEN LOW DOSE W/O CM; Future  8. Smokes less than 1 pack a day with greater than 30 pack year history Discussed cutting down; greater than 3 minutes discussing  - CT CHEST LUNG CA SCREEN LOW DOSE W/O CM; Future  9. Need for influenza vaccination - Flu Vaccine QUAD 6+ mos PF IM (Fluarix Quad PF)  10. Need for shingles vaccine - Zoster Vaccine Adjuvanted [facility]) injection; Inject 0.5 mLs into the muscle once for 1 dose.  Dispense: 0.5 mL; Refill: 0  11. Swelling of right hand Discussed, ice/heat, monitor  - DG Hand Complete Right; Future  -USPSTF grade A and B recommendations reviewed with patient; age-appropriate recommendations, preventive care, screening tests, etc discussed and encouraged; healthy living encouraged; see AVS for patient education given to patient -Discussed importance of 150 minutes of physical activity weekly, eat two servings of fish weekly, eat one serving of tree nuts ( cashews, pistachios, pecans, almonds.Marland Kitchen) every other day, eat 6 servings of fruit/vegetables daily and drink plenty of water and avoid sweet beverages.   -Reviewed Health Maintenance: flu shot today.

## 2017-10-06 LAB — COMPLETE METABOLIC PANEL WITH GFR
AG Ratio: 1.7 (calc) (ref 1.0–2.5)
ALBUMIN MSPROF: 4.3 g/dL (ref 3.6–5.1)
ALT: 23 U/L (ref 6–29)
AST: 21 U/L (ref 10–35)
Alkaline phosphatase (APISO): 83 U/L (ref 33–130)
BUN: 14 mg/dL (ref 7–25)
CALCIUM: 10.1 mg/dL (ref 8.6–10.4)
CO2: 24 mmol/L (ref 20–32)
CREATININE: 0.6 mg/dL (ref 0.50–1.05)
Chloride: 105 mmol/L (ref 98–110)
GFR, EST AFRICAN AMERICAN: 119 mL/min/{1.73_m2} (ref >=60)
GFR, EST NON AFRICAN AMERICAN: 103 mL/min/{1.73_m2} (ref >=60)
GLOBULIN: 2.6 g/dL (ref 1.9–3.7)
Glucose, Bld: 92 mg/dL (ref 65–99)
Potassium: 4.4 mmol/L (ref 3.5–5.3)
SODIUM: 139 mmol/L (ref 135–146)
TOTAL PROTEIN: 6.9 g/dL (ref 6.1–8.1)
Total Bilirubin: 0.4 mg/dL (ref 0.2–1.2)

## 2017-10-06 LAB — VITAMIN D 25 HYDROXY (VIT D DEFICIENCY, FRACTURES): Vit D, 25-Hydroxy: 38 ng/mL (ref 30–100)

## 2017-10-06 LAB — LIPID PANEL
CHOL/HDL RATIO: 3.5 (calc) (ref <5.0)
Cholesterol: 200 mg/dL — ABNORMAL HIGH (ref <200)
HDL: 57 mg/dL (ref >50)
LDL CHOLESTEROL (CALC): 124 mg/dL — AB
NON-HDL CHOLESTEROL (CALC): 143 mg/dL — AB (ref <130)
Triglycerides: 86 mg/dL (ref <150)

## 2017-10-07 ENCOUNTER — Telehealth: Payer: Self-pay | Admitting: *Deleted

## 2017-10-07 NOTE — Telephone Encounter (Signed)
Received referral for low dose lung cancer screening CT scan. Message left at phone number listed in EMR for patient to call me back to facilitate scheduling scan.  

## 2017-10-09 ENCOUNTER — Other Ambulatory Visit: Payer: Self-pay | Admitting: Family Medicine

## 2017-10-09 DIAGNOSIS — E7800 Pure hypercholesterolemia, unspecified: Secondary | ICD-10-CM

## 2017-10-09 NOTE — Telephone Encounter (Signed)
[facility]ill Request for Cholesterol medication. [facility]   Last physical: 10/05/2017   Lab Results  Component Value Date   CHOL 200 (H) 10/05/2017   HDL 57 10/05/2017   LDLCALC 124 (H) 10/05/2017   TRIG 86 10/05/2017   CHOLHDL 3.5 10/05/2017    Follow up on 10/13/2017

## 2017-10-13 ENCOUNTER — Ambulatory Visit: Payer: 59

## 2017-10-16 ENCOUNTER — Telehealth: Payer: Self-pay | Admitting: *Deleted

## 2017-10-16 NOTE — Telephone Encounter (Signed)
Received referral for low dose lung cancer screening CT scan.  Message left at phone number listed in EMR for patient to call either myself or Shawn Perkins back at [phone] to facilitate scheduling the scan.    

## 2017-10-20 ENCOUNTER — Telehealth: Payer: Self-pay | Admitting: *Deleted

## 2017-10-20 ENCOUNTER — Encounter: Payer: Self-pay | Admitting: *Deleted

## 2017-10-20 DIAGNOSIS — Z122 Encounter for screening for malignant neoplasm of respiratory organs: Secondary | ICD-10-CM

## 2017-10-20 NOTE — Telephone Encounter (Signed)
Received a referral for initial lung cancer screening scan.  Contacted the patient and obtained their smoking history, currently smokes 1 ppd with 37 pkyr history   as well as answering questions related to screening process.  Patient denies signs of lung cancer such as weight loss or hemoptysis at this time.  Patient denies comorbidity that would prevent curative treatment if lung cancer were found.  Patient is scheduled for the Shared Decision Making Visit and CT scan on 11-10-17@1330  .

## 2017-10-26 ENCOUNTER — Other Ambulatory Visit: Payer: Self-pay | Admitting: Family Medicine

## 2017-10-26 DIAGNOSIS — F3340 Major depressive disorder, recurrent, in remission, unspecified: Secondary | ICD-10-CM

## 2017-11-10 ENCOUNTER — Inpatient Hospital Stay: Payer: 59 | Attending: Oncology | Admitting: Oncology

## 2017-11-10 ENCOUNTER — Encounter: Payer: Self-pay | Admitting: Family Medicine

## 2017-11-10 ENCOUNTER — Ambulatory Visit
Admission: RE | Admit: 2017-11-10 | Discharge: 2017-11-10 | Disposition: A | Discharge status: Home / Self Care | Payer: 59 | Source: Ambulatory Visit | Attending: Oncology | Admitting: Oncology

## 2017-11-10 ENCOUNTER — Encounter: Payer: 59 | Admitting: Family Medicine

## 2017-11-10 DIAGNOSIS — J439 Emphysema, unspecified: Secondary | ICD-10-CM | POA: Diagnosis not present

## 2017-11-10 DIAGNOSIS — R932 Abnormal findings on diagnostic imaging of liver and biliary tract: Secondary | ICD-10-CM | POA: Insufficient documentation

## 2017-11-10 DIAGNOSIS — Z87891 Personal history of nicotine dependence: Secondary | ICD-10-CM | POA: Diagnosis not present

## 2017-11-10 DIAGNOSIS — I700 Atherosclerosis of aorta: Secondary | ICD-10-CM | POA: Insufficient documentation

## 2017-11-10 DIAGNOSIS — J432 Centrilobular emphysema: Secondary | ICD-10-CM | POA: Insufficient documentation

## 2017-11-10 DIAGNOSIS — Z122 Encounter for screening for malignant neoplasm of respiratory organs: Secondary | ICD-10-CM | POA: Diagnosis present

## 2017-11-10 NOTE — Progress Notes (Signed)
In accordance with CMS guidelines, patient has met eligibility criteria including age, absence of signs or symptoms of lung cancer.  Social History   Tobacco Use  . Smoking status: Current Every Day Smoker    Packs/day: 1.00    Years: 37.00    Pack years: 37.00    Types: Cigarettes    Start date: 10/06/1987  . Smokeless tobacco: Never Used  Substance Use Topics  . Alcohol use: Yes    Alcohol/week: 0.0 standard drinks    Comment: Occasional  . Drug use: No     A shared decision-making session was conducted prior to the performance of CT scan. This includes one or more decision aids, includes benefits and harms of screening, follow-up diagnostic testing, over-diagnosis, false positive rate, and total radiation exposure.  Counseling on the importance of adherence to annual lung cancer LDCT screening, impact of co-morbidities, and ability or willingness to undergo diagnosis and treatment is imperative for compliance of the program.  Counseling on the importance of continued smoking cessation for former smokers; the importance of smoking cessation for current smokers, and information about tobacco cessation interventions have been given to patient including [place] and 1800 quit [place] programs.  Written order for lung cancer screening with LDCT has been given to the patient and any and all questions have been answered to the best of my abilities.   Yearly follow up will be coordinated by Burgess Estelle, Thoracic Navigator.  Faythe Casa, NP 11/10/2017 3:07 PM

## 2017-11-11 ENCOUNTER — Other Ambulatory Visit: Payer: Self-pay

## 2017-11-11 ENCOUNTER — Other Ambulatory Visit: Payer: Self-pay | Admitting: Family Medicine

## 2017-11-11 ENCOUNTER — Encounter: Payer: Self-pay | Admitting: *Deleted

## 2017-11-11 DIAGNOSIS — F3340 Major depressive disorder, recurrent, in remission, unspecified: Secondary | ICD-10-CM

## 2017-11-11 MED ORDER — FLUOXETINE HCL 40 MG PO CAPS: ORAL_CAPSULE | ORAL | 0 refills | Status: DC

## 2017-11-11 NOTE — Telephone Encounter (Signed)
Sending one refill, but she needs to come in to discuss CT lung results in the next 4-6 weeks

## 2017-11-11 NOTE — Telephone Encounter (Signed)
This appears to be a Dr. Ancil Boozer patient

## 2017-11-11 NOTE — Telephone Encounter (Signed)
Copied from [place] [id number]. Topic: General - Other >> Nov 10, 2017  4:07 PM Carolyn Stare wrote:  Pt never picked up the RX that was sent in on 10/26/17 and now need a refill saw . Had a CPE with Suezanne Cheshire in August 2019   FLUoxetine (PROZAC) 40 MG capsule   [facility] [address]

## 2017-11-12 ENCOUNTER — Telehealth: Payer: Self-pay | Admitting: *Deleted

## 2017-11-12 NOTE — Telephone Encounter (Signed)
Notified patient of LDCT lung cancer screening program results with recommendation for 12 month follow up imaging. Also notified of incidental findings noted below and is encouraged to discuss further with PCP who will receive a copy of this note and/or the CT report. Specifically, patient should expect PCP's office to contact her regarding MRI follow up of hepatic findings. Patient verbalizes understanding.   IMPRESSION: 1. Lung-RADS 2, benign appearance or behavior. Continue annual screening with low-dose chest CT without contrast in 12 months. 2. Markedly heterogeneous liver attenuation. This is most likely related to geographic fatty deposition within the liver parenchyma. MRI of the abdomen is recommended to confirm. 3.  Aortic Atherosclerois (ICD10-170.0) 4.  Emphysema. (ICD10-J43.9)

## 2017-11-12 NOTE — Telephone Encounter (Signed)
Left detailed voicemail

## 2017-11-13 ENCOUNTER — Ambulatory Visit: Payer: 59 | Admitting: Family Medicine

## 2017-11-13 ENCOUNTER — Other Ambulatory Visit: Payer: Self-pay | Admitting: Family Medicine

## 2017-11-13 ENCOUNTER — Encounter: Payer: Self-pay | Admitting: Family Medicine

## 2017-11-13 VITALS — BP 144/76 | HR 72 | Temp 98.2°F | Resp 16 | Ht 62.0 in | Wt 177.3 lb

## 2017-11-13 DIAGNOSIS — J439 Emphysema, unspecified: Secondary | ICD-10-CM | POA: Diagnosis not present

## 2017-11-13 DIAGNOSIS — F3340 Major depressive disorder, recurrent, in remission, unspecified: Secondary | ICD-10-CM | POA: Diagnosis not present

## 2017-11-13 DIAGNOSIS — E7800 Pure hypercholesterolemia, unspecified: Secondary | ICD-10-CM

## 2017-11-13 DIAGNOSIS — K769 Liver disease, unspecified: Secondary | ICD-10-CM

## 2017-11-13 DIAGNOSIS — I10 Essential (primary) hypertension: Secondary | ICD-10-CM

## 2017-11-13 DIAGNOSIS — R932 Abnormal findings on diagnostic imaging of liver and biliary tract: Secondary | ICD-10-CM | POA: Diagnosis not present

## 2017-11-13 DIAGNOSIS — I700 Atherosclerosis of aorta: Secondary | ICD-10-CM

## 2017-11-13 DIAGNOSIS — M5432 Sciatica, left side: Secondary | ICD-10-CM

## 2017-11-13 DIAGNOSIS — Z716 Tobacco abuse counseling: Secondary | ICD-10-CM

## 2017-11-13 MED ORDER — METOPROLOL SUCCINATE ER 50 MG PO TB24: 50.0000 mg | ORAL_TABLET | Freq: Every day | ORAL | 1 refills | Status: DC

## 2017-11-13 MED ORDER — VARENICLINE TARTRATE 0.5 MG X 11 & 1 MG X 42 PO MISC: ORAL | 0 refills | Status: DC

## 2017-11-13 MED ORDER — ROSUVASTATIN CALCIUM 20 MG PO TABS: 20.0000 mg | ORAL_TABLET | Freq: Every day | ORAL | 1 refills | Status: DC

## 2017-11-13 MED ORDER — VARENICLINE TARTRATE 1 MG PO TABS: 1.0000 mg | ORAL_TABLET | Freq: Two times a day (BID) | ORAL | 2 refills | Status: DC

## 2017-11-13 MED ORDER — AMLODIPINE BESYLATE 10 MG PO TABS: 10.0000 mg | ORAL_TABLET | Freq: Every day | ORAL | 1 refills | Status: DC

## 2017-11-13 MED ORDER — CYCLOBENZAPRINE HCL 5 MG PO TABS: 5.0000 mg | ORAL_TABLET | Freq: Three times a day (TID) | ORAL | 0 refills | Status: DC | PRN

## 2017-11-13 MED ORDER — PREDNISONE 10 MG (48) PO TBPK: ORAL_TABLET | ORAL | 0 refills | Status: DC

## 2017-11-13 MED ORDER — TRAMADOL HCL 50 MG PO TABS: 50.0000 mg | ORAL_TABLET | Freq: Three times a day (TID) | ORAL | 0 refills | Status: DC | PRN

## 2017-11-13 MED ORDER — FLUOXETINE HCL 40 MG PO CAPS: ORAL_CAPSULE | ORAL | 1 refills | Status: DC

## 2017-11-13 MED ORDER — HYDROCHLOROTHIAZIDE 12.5 MG PO TABS: 12.5000 mg | ORAL_TABLET | Freq: Every day | ORAL | 1 refills | Status: DC

## 2017-11-13 NOTE — Progress Notes (Signed)
Name: Nichole Wells   MRN: [id number]    DOB: [birth year]   Date:11/13/2017       Progress Note  Subjective  Chief Complaint  Chief Complaint  Patient presents with  . Back Pain    Onset-1 week, left lower back, denies any trauma, constant to the point she is unable to sleep at night, has been taking Aleve with no relief.     HPI  HTN: taking medication daily, no side effects , no chest pain, but has occasional palpitation, however toprol XL helps with that. She is pain today and bp is a little high, but we will continue to monitor. She was advised to stop by for a nurse visit and bp check   Inflammatory arthritis: seeing Dr. Jefm Bryant, she stopped methotrexate months ago, she has follow up with him next week. She has intermittent joint swelling also erythema on both hands.   Hyperlipidemia/Aorta atherosclerosis she has not been compliant with Pravastatin however we will change to Crestor because LDL is above 100 and advised to add aspirin 81 mg daily. Atherosclerosis found on CT chest  Liver lesion: on CT chest, we will order MRI as recommended. She denies nausea or vomiting, appetite is normal, but has lost 4 lbs since March  Major Depression: she was diagnosed in her 80's, she has been taking Prozac for many years, tried other medication in the past. Initially seen by psychiatrist. Stable on medication, no side effects  Hyperglycemia: she denies polyphagia, polydipsia or polyuria. Unchanged  Tobacco use/emphysema on CT chest: today she said is willing to quit, currently smoking one pack daily, has daily productive cough throughout the day, also has some SOB with activity, sometimes wakes up during the night. We will check spirometry on her next visit.   Low back pain with sciatica: going on for weeks, but has been taking Aleve and dealing with it. Intermittent pain going down to left leg. She states pain is constant right now 8/10 at this time, usually aching occasionally shoots down  her leg and is sharp, sometimes has spasms on her back also. Pain radiates to right lower back also. No bowel or bladder incontinence.  She has a history of herniated disc and DDD, never had surgery but had injections in the past  Patient Active Problem List   Diagnosis Date Noted  . Thoracic aorta atherosclerosis ([place]) 11/10/2017  . Emphysema lung ([place]) 11/10/2017  . Abnormal liver CT 11/10/2017  . Inflammatory arthritis 05/07/2017  . Inflamed nasal mucosa 05/07/2017  . Current tobacco use 05/07/2017  . Vitamin D deficiency 05/28/2016  . Surgical menopause on hormone replacement therapy 04/23/2016  . Status post laparoscopic assisted vaginal hysterectomy ([facility]) 01/28/2016  . Family history of ovarian cancer 11/13/2015  . Insomnia 03/27/2015  . Hypertension 08/30/2014  . Anxiety and depression 08/30/2014  . Cardiac murmur 08/30/2014  . HLD (hyperlipidemia) 08/30/2014  . DDD (degenerative disc disease), lumbar 07/26/2014    Past Surgical History:  Procedure Laterality Date  . ABDOMINAL HYSTERECTOMY    . BREAST BIOPSY Left    stereo- neg  . BREAST CYST ASPIRATION Right    neg  . BREAST SURGERY     benign biopsy  . CHOLECYSTECTOMY    . DILATION AND CURETTAGE OF UTERUS    . LAPAROSCOPIC LYSIS OF ADHESIONS  12/10/2015   Procedure: LAPAROSCOPIC LYSIS OF ADHESIONS;  Surgeon: Brayton Mars, MD;  Location: [facility];  Service: Gynecology;;  . LAPAROSCOPIC VAGINAL HYSTERECTOMY WITH SALPINGO OOPHORECTOMY Bilateral 01/28/2016  Procedure: LAPAROSCOPIC ASSISTED VAGINAL HYSTERECTOMY WITH SALPINGO OOPHORECTOMY;  Surgeon: Brayton Mars, MD;  Location: [facility];  Service: Gynecology;  Laterality: Bilateral;  . LAPAROSCOPY N/A 12/10/2015   Procedure: LAPAROSCOPY DIAGNOSTIC WITH BIOPSIES;  Surgeon: Brayton Mars, MD;  Location: [facility];  Service: Gynecology;  Laterality: N/A;  . SHOULDER ARTHROSCOPY Right   . TUBAL LIGATION    . UTERINE FIBROID EMBOLIZATION      Family  History  Problem Relation Age of Onset  . Cancer Mother        cervical, lung  . Lung cancer Mother   . Heart disease Father   . Hypertension Father   . COPD Father   . Breast cancer Maternal Aunt   . Ovarian cancer Maternal Aunt   . Spina bifida Sister   . Diabetes Son   . Diabetes Maternal Grandmother   . Heart disease Maternal Grandmother   . Parkinson's disease Paternal Grandfather     Social History   Socioeconomic History  . Marital status: Married    Spouse name: [name]   . Number of children: 2  . Years of education: Not on file  . Highest education level: Some college, no degree  Occupational History  . Occupation: Freight forwarder  Social Needs  . Financial resource strain: Not hard at all  . Food insecurity:    Worry: Never true    Inability: Never true  . Transportation needs:    Medical: No    Non-medical: No  Tobacco Use  . Smoking status: Current Every Day Smoker    Packs/day: 1.00    Years: 37.00    Pack years: 37.00    Types: Cigarettes    Start date: 10/06/1987  . Smokeless tobacco: Never Used  Substance and Sexual Activity  . Alcohol use: Yes    Alcohol/week: 0.0 standard drinks    Comment: Occasional  . Drug use: No  . Sexual activity: Yes    Partners: Male    Birth control/protection: Surgical  Lifestyle  . Physical activity:    Days per week: 5 days    Minutes per session: 60 min  . Stress: Not at all  Relationships  . Social connections:    Talks on phone: Once a week    Gets together: Never    Attends religious service: Never    Active member of club or organization: No    Attends meetings of clubs or organizations: Never    Relationship status: Married  . Intimate partner violence:    Fear of current or ex partner: No    Emotionally abused: No    Physically abused: No    Forced sexual activity: No  Other Topics Concern  . Not on file  Social History Narrative   Married   Works full time   Has 2 grown children      Current  Outpatient Medications:  .  amLODipine (NORVASC) 10 MG tablet, Take 1 tablet (10 mg total) by mouth daily., Disp: 90 tablet, Rfl: 1 .  cetirizine (ZYRTEC) 10 MG tablet, Take 10 mg by mouth daily., Disp: , Rfl:  .  FLUoxetine (PROZAC) 40 MG capsule, TAKE 1 CAPSULE BY MOUTH EVERY DAY., Disp: 90 capsule, Rfl: 1 .  folic acid (FOLVITE) 1 MG tablet, Take 1 mg by mouth daily., Disp: , Rfl:  .  hydrochlorothiazide (HYDRODIURIL) 12.5 MG tablet, Take 1 tablet (12.5 mg total) by mouth daily., Disp: 90 tablet, Rfl: 1 .  metoprolol succinate (TOPROL-XL) 50 MG 24  hr tablet, Take 1 tablet (50 mg total) by mouth daily. Take with or immediately following a meal., Disp: 90 tablet, Rfl: 1 .  gabapentin (NEURONTIN) 100 MG capsule, Take 100 mg by mouth 3 (three) times daily., Disp: , Rfl: 11 .  methotrexate (RHEUMATREX) 2.5 MG tablet, Take 15 mg by mouth every 7 (seven) days., Disp: , Rfl: 1 .  predniSONE (STERAPRED UNI-PAK 48 TAB) 10 MG (48) TBPK tablet, Take as directed, Disp: 42 tablet, Rfl: 0 .  traMADol (ULTRAM) 50 MG tablet, Take 1 tablet (50 mg total) by mouth every 8 (eight) hours as needed., Disp: 20 tablet, Rfl: 0 .  varenicline (CHANTIX CONTINUING MONTH PAK) 1 MG tablet, Take 1 tablet (1 mg total) by mouth 2 (two) times daily., Disp: 60 tablet, Rfl: 2 .  varenicline (CHANTIX STARTING MONTH PAK) 0.5 MG X 11 & 1 MG X 42 tablet, Take one 0.5 mg tablet once day for 3 days, two pills for 4 days, after that 2 pills twice day, Disp: 53 tablet, Rfl: 0  Allergies  Allergen Reactions  . Hydroxychloroquine Itching    Pt take zyrtec to alleviate symptoms   . Penicillin G Hives    Has patient had a PCN reaction causing immediate rash, facial/tongue/throat swelling, SOB or lightheadedness with hypotension:unsure Has patient had a PCN reaction causing severe rash involving mucus membranes or skin necrosis:unsure Has patient had a PCN reaction that required hospitalization:unsure Has patient had a PCN reaction  occurring within the last 10 years:No If all of the above answers are "NO", then may proceed with Cephalosporin use.     I personally reviewed active problem list, medication list, allergies, family history, social history with the patient/caregiver today.   ROS  Constitutional: Negative for fever, positive for mild  weight change.  Respiratory: Positive  for cough and shortness of breath.   Cardiovascular: Negative for chest pain or palpitations.  Gastrointestinal: Negative for abdominal pain, no bowel changes.  Musculoskeletal: positive  for gait problem and intermittent  joint swelling.  Skin: Negative for rash.  Neurological: Negative for dizziness or headache.  No other specific complaints in a complete review of systems (except as listed in HPI above).   Objective  Vitals:   11/13/17 1135  BP: (!) 144/76  Pulse: 72  Resp: 16  Temp: 98.2 F (36.8 C)  TempSrc: Oral  SpO2: 97%  Weight: 177 lb 4.8 oz (80.4 kg)  Height: 5\' 2"  (1.575 m)    Body mass index is 32.43 kg/m.  Physical Exam  Constitutional: Patient appears well-developed and well-nourished. Obese  No distress.  HEENT: head atraumatic, normocephalic, pupils equal and reactive to light,  neck supple, throat within normal limits Cardiovascular: Normal rate, regular rhythm and normal heart sounds.  No murmur heard. No BLE edema. Pulmonary/Chest: Effort normal and breath sounds normal. No respiratory distress. Abdominal: Soft.  There is no tenderness. Muscular Skeletal: pain during palpation of left lower back with positive left straight leg raise, no rashes on her back antalgic gait Psychiatric: Patient has a normal mood and affect. behavior is normal. Judgment and thought content normal.   Recent Results (from the past 2160 hour(s))  COMPLETE METABOLIC PANEL WITH GFR     Status: None   Collection Time: 10/05/17 10:48 AM  Result Value Ref Range   Glucose, Bld 92 65 - 99 mg/dL    Comment: .             Fasting reference interval .  BUN 14 7 - 25 mg/dL   Creat 0.60 0.50 - 1.05 mg/dL    Comment: For patients >23 years of age, the reference limit for Creatinine is approximately 13% higher for people identified as African-American. .    GFR, Est Non African American 103 > OR = 60 mL/min/1.96m2   GFR, Est African American 119 > OR = 60 mL/min/1.20m2   BUN/Creatinine Ratio NOT APPLICABLE 6 - 22 (calc)   Sodium 139 135 - 146 mmol/L   Potassium 4.4 3.5 - 5.3 mmol/L   Chloride 105 98 - 110 mmol/L   CO2 24 20 - 32 mmol/L   Calcium 10.1 8.6 - 10.4 mg/dL   Total Protein 6.9 6.1 - 8.1 g/dL   Albumin 4.3 3.6 - 5.1 g/dL   Globulin 2.6 1.9 - 3.7 g/dL (calc)   AG Ratio 1.7 1.0 - 2.5 (calc)   Total Bilirubin 0.4 0.2 - 1.2 mg/dL   Alkaline phosphatase (APISO) 83 33 - 130 U/L   AST 21 10 - 35 U/L   ALT 23 6 - 29 U/L  Vitamin D (25 hydroxy)     Status: None   Collection Time: 10/05/17 10:48 AM  Result Value Ref Range   Vit D, 25-Hydroxy 38 30 - 100 ng/mL    Comment: Vitamin D Status         25-OH Vitamin D: . Deficiency:                    <20 ng/mL Insufficiency:             20 - 29 ng/mL Optimal:                 > or = 30 ng/mL . For 25-OH Vitamin D testing on patients on  D2-supplementation and patients for whom quantitation  of D2 and D3 fractions is required, the QuestAssureD(TM) 25-OH VIT D, (D2,D3), LC/MS/MS is recommended: order  code [phone] (patients >78yrs). . For more information on this test, go to: http://education.quest[facility]ostics.com/faq/FAQ163 (This link is being provided for  informational/educational purposes only.)   Lipid Profile     Status: Abnormal   Collection Time: 10/05/17 10:48 AM  Result Value Ref Range   Cholesterol 200 (H) <200 mg/dL   HDL 57 >50 mg/dL   Triglycerides 86 <150 mg/dL   LDL Cholesterol (Calc) 124 (H) mg/dL (calc)    Comment: Reference range: <100 . Desirable range <100 mg/dL for primary prevention;   <70 mg/dL for patients with CHD or  diabetic patients  with > or = 2 CHD risk factors. Marland Kitchen LDL-C is now calculated using the Martin-Hopkins  calculation, which is a validated novel method providing  better accuracy than the Friedewald equation in the  estimation of LDL-C.  Cresenciano Genre et al. Annamaria Helling. 3976;734(19): 2061-2068  (http://education.[facility].com/faq/FAQ164)    Total CHOL/HDL Ratio 3.5 <5.0 (calc)   Non-HDL Cholesterol (Calc) 143 (H) <130 mg/dL (calc)    Comment: For patients with diabetes plus 1 major ASCVD risk  factor, treating to a non-HDL-C goal of <100 mg/dL  (LDL-C of <70 mg/dL) is considered a therapeutic  option.       PHQ2/9: Depression screen [facility] 2/9 10/05/2017 01/20/2017 09/23/2016 07/28/2016 05/28/2016  Decreased Interest 0 0 0 0 0  Down, Depressed, Hopeless 0 0 0 0 0  PHQ - 2 Score 0 0 0 0 0     Fall Risk: Fall Risk  10/05/2017 05/07/2017 01/20/2017 09/23/2016 07/28/2016  Falls in the past year? No  No No No No      Assessment & Plan  1. Essential hypertension  - hydrochlorothiazide (HYDRODIURIL) 12.5 MG tablet; Take 1 tablet (12.5 mg total) by mouth daily.  Dispense: 90 tablet; Refill: 1 - amLODipine (NORVASC) 10 MG tablet; Take 1 tablet (10 mg total) by mouth daily.  Dispense: 90 tablet; Refill: 1 - metoprolol succinate (TOPROL-XL) 50 MG 24 hr tablet; Take 1 tablet (50 mg total) by mouth daily. Take with or immediately following a meal.  Dispense: 90 tablet; Refill: 1  2. Recurrent major depressive disorder, in remission (HCC)  - FLUoxetine (PROZAC) 40 MG capsule; TAKE 1 CAPSULE BY MOUTH EVERY DAY.  Dispense: 90 capsule; Refill: 1  3. Abnormal liver CT  We will check MRI  4. Pulmonary emphysema, unspecified emphysema type ([place])  Return for spirometry, she will start chantix   5. Thoracic aorta atherosclerosis ([place])  Start crestor   6. Pure hypercholesterolemia  - rosuvastatin (CRESTOR) 20 MG tablet; Take 1 tablet (20 mg total) by mouth daily.  Dispense: 90 tablet; Refill:  1  7. Encounter for tobacco use cessation counseling  - varenicline (CHANTIX CONTINUING MONTH PAK) 1 MG tablet; Take 1 tablet (1 mg total) by mouth 2 (two) times daily.  Dispense: 60 tablet; Refill: 2 - varenicline (CHANTIX STARTING MONTH PAK) 0.5 MG X 11 & 1 MG X 42 tablet; Take one 0.5 mg tablet once day for 3 days, two pills for 4 days, after that 2 pills twice day  Dispense: 53 tablet; Refill: 0  8. Liver disease  - MR LIVER W WO CONTRAST; Future  9. Left sided sciatica  - predniSONE (STERAPRED UNI-PAK 48 TAB) 10 MG (48) TBPK tablet; Take as directed  Dispense: 42 tablet; Refill: 0 - traMADol (ULTRAM) 50 MG tablet; Take 1 tablet (50 mg total) by mouth every 8 (eight) hours as needed.  Dispense: 20 tablet; Refill: 0 - cyclobenzaprine (FLEXERIL) 5 MG tablet; Take 1-2 tablets (5-10 mg total) by mouth 3 (three) times daily as needed for muscle spasms.  Dispense: 30 tablet; Refill: 0

## 2017-11-13 NOTE — Patient Instructions (Signed)
Steps to Quit Smoking Smoking tobacco can be bad for your health. It can also affect almost every organ in your body. Smoking puts you and people around you at risk for many serious long-lasting (chronic) diseases. Quitting smoking is hard, but it is one of the best things that you can do for your health. It is never too late to quit. What are the benefits of quitting smoking? When you quit smoking, you lower your risk for getting serious diseases and conditions. They can include:  Lung cancer or lung disease.  Heart disease.  Stroke.  Heart attack.  Not being able to have children (infertility).  Weak bones (osteoporosis) and broken bones (fractures).  If you have coughing, wheezing, and shortness of breath, those symptoms may get better when you quit. You may also get sick less often. If you are pregnant, quitting smoking can help to lower your chances of having a baby of low birth weight. What can I do to help me quit smoking? Talk with your doctor about what can help you quit smoking. Some things you can do (strategies) include:  Quitting smoking totally, instead of slowly cutting back how much you smoke over a period of time.  Going to in-person counseling. You are more likely to quit if you go to many counseling sessions.  Using resources and support systems, such as: ? Online chats with a counselor. ? Phone quitlines. ? Printed self-help materials. ? Support groups or group counseling. ? Text messaging programs. ? Mobile phone apps or applications.  Taking medicines. Some of these medicines may have nicotine in them. If you are pregnant or breastfeeding, do not take any medicines to quit smoking unless your doctor says it is okay. Talk with your doctor about counseling or other things that can help you.  Talk with your doctor about using more than one strategy at the same time, such as taking medicines while you are also going to in-person counseling. This can help make  quitting easier. What things can I do to make it easier to quit? Quitting smoking might feel very hard at first, but there is a lot that you can do to make it easier. Take these steps:  Talk to your family and friends. Ask them to support and encourage you.  Call phone quitlines, reach out to support groups, or work with a counselor.  Ask people who smoke to not smoke around you.  Avoid places that make you want (trigger) to smoke, such as: ? Bars. ? Parties. ? Smoke-break areas at work.  Spend time with people who do not smoke.  Lower the stress in your life. Stress can make you want to smoke. Try these things to help your stress: ? Getting regular exercise. ? Deep-breathing exercises. ? Yoga. ? Meditating. ? Doing a body scan. To do this, close your eyes, focus on one area of your body at a time from head to toe, and notice which parts of your body are tense. Try to relax the muscles in those areas.  Download or buy apps on your mobile phone or tablet that can help you stick to your quit plan. There are many free apps, such as QuitGuide from the CDC (Centers for Disease Control and Prevention). You can find more support from smokefree.gov and other websites.  This information is not intended to replace advice given to you by your health care provider. Make sure you discuss any questions you have with your health care provider. Document Released: 11/23/2008 Document   Revised: 09/25/2015 Document Reviewed: 06/13/2014 Elsevier Interactive Patient Education  2018 [employer] Inc.  

## 2017-12-04 ENCOUNTER — Ambulatory Visit
Admission: RE | Admit: 2017-12-04 | Discharge: 2017-12-04 | Disposition: A | Discharge status: Home / Self Care | Payer: 59 | Source: Ambulatory Visit | Attending: Family Medicine | Admitting: Family Medicine

## 2017-12-04 DIAGNOSIS — K769 Liver disease, unspecified: Secondary | ICD-10-CM | POA: Diagnosis not present

## 2017-12-04 DIAGNOSIS — K760 Fatty (change of) liver, not elsewhere classified: Secondary | ICD-10-CM | POA: Insufficient documentation

## 2017-12-04 DIAGNOSIS — K838 Other specified diseases of biliary tract: Secondary | ICD-10-CM | POA: Diagnosis not present

## 2017-12-04 DIAGNOSIS — Z9049 Acquired absence of other specified parts of digestive tract: Secondary | ICD-10-CM | POA: Diagnosis not present

## 2017-12-04 MED ORDER — GADOBUTROL 1 MMOL/ML IV SOLN
8.0000 mL | Freq: Once | INTRAVENOUS | Status: AC | PRN
  Administered 2017-12-04: 8 mL via INTRAVENOUS

## 2018-01-19 ENCOUNTER — Other Ambulatory Visit: Payer: Self-pay

## 2018-01-19 ENCOUNTER — Emergency Department
Admission: EM | Admit: 2018-01-19 | Discharge: 2018-01-19 | Disposition: A | Discharge status: Home / Self Care | Payer: No Typology Code available for payment source | Attending: Emergency Medicine | Admitting: Emergency Medicine

## 2018-01-19 ENCOUNTER — Emergency Department: Payer: No Typology Code available for payment source

## 2018-01-19 DIAGNOSIS — Z79899 Other long term (current) drug therapy: Secondary | ICD-10-CM | POA: Diagnosis not present

## 2018-01-19 DIAGNOSIS — Y929 Unspecified place or not applicable: Secondary | ICD-10-CM | POA: Insufficient documentation

## 2018-01-19 DIAGNOSIS — Y990 Civilian activity done for income or pay: Secondary | ICD-10-CM | POA: Diagnosis not present

## 2018-01-19 DIAGNOSIS — F17210 Nicotine dependence, cigarettes, uncomplicated: Secondary | ICD-10-CM | POA: Diagnosis not present

## 2018-01-19 DIAGNOSIS — S6991XA Unspecified injury of right wrist, hand and finger(s), initial encounter: Secondary | ICD-10-CM | POA: Diagnosis present

## 2018-01-19 DIAGNOSIS — Y939 Activity, unspecified: Secondary | ICD-10-CM | POA: Diagnosis not present

## 2018-01-19 DIAGNOSIS — I10 Essential (primary) hypertension: Secondary | ICD-10-CM | POA: Diagnosis not present

## 2018-01-19 DIAGNOSIS — W010XXA Fall on same level from slipping, tripping and stumbling without subsequent striking against object, initial encounter: Secondary | ICD-10-CM | POA: Insufficient documentation

## 2018-01-19 MED ORDER — OXYCODONE-ACETAMINOPHEN 5-325 MG PO TABS
1.0000 | ORAL_TABLET | Freq: Once | ORAL | Status: AC
  Administered 2018-01-19: 1 via ORAL
  Filled 2018-01-19: qty 1

## 2018-01-19 MED ORDER — IBUPROFEN 400 MG PO TABS: 400.0000 mg | ORAL_TABLET | Freq: Four times a day (QID) | ORAL | 0 refills | Status: DC | PRN

## 2018-01-19 MED ORDER — IBUPROFEN 600 MG PO TABS
600.0000 mg | ORAL_TABLET | Freq: Once | ORAL | Status: AC
  Administered 2018-01-19: 600 mg via ORAL
  Filled 2018-01-19: qty 1

## 2018-01-19 NOTE — ED Triage Notes (Signed)
Pt fell at work and is now co right wrist pain

## 2018-01-19 NOTE — ED Notes (Signed)
Pt is filing workmans comp, per profile testing is only upon request.  No supervisor with patient at this time.

## 2018-01-19 NOTE — ED Provider Notes (Signed)
[facility] Emergency Department Provider Note  ____________________________________________  Time seen: Approximately 11:02 PM  I have reviewed the triage vital signs and the nursing notes.   HISTORY  Chief Complaint Wrist Pain    HPI Nichole Wells is a 57 y.o. female presents emergency department for evaluation of right wrist pain after fall today.  Patient states that she was at work and tripped.  She landed on her right hand and left knee.  Right wrist is the worst.  She has been walking without difficulty.  No additional injuries.  No numbness or tingling to hand.   Past Medical History:  Diagnosis Date  . Anxiety   . Arthritis    poss ra  . GERD (gastroesophageal reflux disease)    occ  . Heart murmur   . Hypertension   . Inflammatory arthritis   . Vitamin D deficiency     Patient Active Problem List   Diagnosis Date Noted  . Thoracic aorta atherosclerosis ([place]) 11/10/2017  . Emphysema lung ([place]) 11/10/2017  . Abnormal liver CT 11/10/2017  . Inflammatory arthritis 05/07/2017  . Inflamed nasal mucosa 05/07/2017  . Current tobacco use 05/07/2017  . Vitamin D deficiency 05/28/2016  . Surgical menopause on hormone replacement therapy 04/23/2016  . Status post laparoscopic assisted vaginal hysterectomy (LAVH) 01/28/2016  . Family history of ovarian cancer 11/13/2015  . Insomnia 03/27/2015  . Hypertension 08/30/2014  . Anxiety and depression 08/30/2014  . Cardiac murmur 08/30/2014  . HLD (hyperlipidemia) 08/30/2014  . DDD (degenerative disc disease), lumbar 07/26/2014    Past Surgical History:  Procedure Laterality Date  . ABDOMINAL HYSTERECTOMY    . BREAST BIOPSY Left    stereo- neg  . BREAST CYST ASPIRATION Right    neg  . BREAST SURGERY     benign biopsy  . CHOLECYSTECTOMY    . DILATION AND CURETTAGE OF UTERUS    . LAPAROSCOPIC LYSIS OF ADHESIONS  12/10/2015   Procedure: LAPAROSCOPIC LYSIS OF ADHESIONS;  Surgeon: Brayton Mars, MD;  Location: [facility] ORS;  Service: Gynecology;;  . LAPAROSCOPIC VAGINAL HYSTERECTOMY WITH SALPINGO OOPHORECTOMY Bilateral 01/28/2016   Procedure: LAPAROSCOPIC ASSISTED VAGINAL HYSTERECTOMY WITH SALPINGO OOPHORECTOMY;  Surgeon: Brayton Mars, MD;  Location: [facility] ORS;  Service: Gynecology;  Laterality: Bilateral;  . LAPAROSCOPY N/A 12/10/2015   Procedure: LAPAROSCOPY DIAGNOSTIC WITH BIOPSIES;  Surgeon: Brayton Mars, MD;  Location: [facility] ORS;  Service: Gynecology;  Laterality: N/A;  . SHOULDER ARTHROSCOPY Right   . TUBAL LIGATION    . UTERINE FIBROID EMBOLIZATION      Prior to Admission medications   Medication Sig Start Date End Date Taking? Authorizing Provider  amLODipine (NORVASC) 10 MG tablet Take 1 tablet (10 mg total) by mouth daily. 11/13/17   Steele Sizer, MD  cetirizine (ZYRTEC) 10 MG tablet Take 10 mg by mouth daily.    [provider]  cyclobenzaprine (FLEXERIL) 5 MG tablet Take 1-2 tablets (5-10 mg total) by mouth 3 (three) times daily as needed for muscle spasms. 11/13/17   Steele Sizer, MD  FLUoxetine (PROZAC) 40 MG capsule TAKE 1 CAPSULE BY MOUTH EVERY DAY. 11/13/17   Steele Sizer, MD  gabapentin (NEURONTIN) 100 MG capsule Take 100 mg by mouth 3 (three) times daily. 01/12/17   [provider]  hydrochlorothiazide (HYDRODIURIL) 12.5 MG tablet Take 1 tablet (12.5 mg total) by mouth daily. 11/13/17   Steele Sizer, MD  ibuprofen (ADVIL,MOTRIN) 400 MG tablet Take 1 tablet (400 mg total) by mouth every  6 (six) hours as needed. 01/19/18   Laban Emperor, PA-C  methotrexate (RHEUMATREX) 2.5 MG tablet Take 15 mg by mouth every 7 (seven) days. 12/28/16   Emmaline Kluver., MD  metoprolol succinate (TOPROL-XL) 50 MG 24 hr tablet Take 1 tablet (50 mg total) by mouth daily. Take with or immediately following a meal. 11/13/17   Sowles, Drue Stager, MD  predniSONE (STERAPRED UNI-PAK 48 TAB) 10 MG (48) TBPK tablet Take as directed 11/13/17    Steele Sizer, MD  rosuvastatin (CRESTOR) 20 MG tablet Take 1 tablet (20 mg total) by mouth daily. 11/13/17   Steele Sizer, MD  traMADol (ULTRAM) 50 MG tablet Take 1 tablet (50 mg total) by mouth every 8 (eight) hours as needed. 11/13/17   Steele Sizer, MD  varenicline (CHANTIX CONTINUING MONTH PAK) 1 MG tablet Take 1 tablet (1 mg total) by mouth 2 (two) times daily. 11/13/17   Steele Sizer, MD  varenicline (CHANTIX STARTING MONTH PAK) 0.5 MG X 11 & 1 MG X 42 tablet Take one 0.5 mg tablet once day for 3 days, two pills for 4 days, after that 2 pills twice day 11/13/17   Steele Sizer, MD    Allergies Hydroxychloroquine and Penicillin g  Family History  Problem Relation Age of Onset  . Cancer Mother        cervical, lung  . Lung cancer Mother   . Heart disease Father   . Hypertension Father   . COPD Father   . Breast cancer Maternal Aunt   . Ovarian cancer Maternal Aunt   . Spina bifida Sister   . Diabetes Son   . Diabetes Maternal Grandmother   . Heart disease Maternal Grandmother   . Parkinson's disease Paternal Grandfather     Social History Social History   Tobacco Use  . Smoking status: Current Every Day Smoker    Packs/day: 1.00    Years: 37.00    Pack years: 37.00    Types: Cigarettes    Start date: 10/06/1987  . Smokeless tobacco: Never Used  Substance Use Topics  . Alcohol use: Yes    Alcohol/week: 0.0 standard drinks    Comment: Occasional  . Drug use: No     Review of Systems  Respiratory:  No SOB. Gastrointestinal: No nausea, no vomiting.  Musculoskeletal: Positive for knee and wrist pain. Skin: Negative for rash, abrasions, lacerations, ecchymosis. Neurological: Negative for headaches, numbness or tingling   ____________________________________________   PHYSICAL EXAM:  VITAL SIGNS: ED Triage Vitals  Enc Vitals Group     BP 01/19/18 2217 (!) 155/50     Pulse Rate 01/19/18 2217 95     Resp 01/19/18 2217 20     Temp 01/19/18 2217 98.2  F (36.8 C)     Temp Source 01/19/18 2217 Oral     SpO2 01/19/18 2217 98 %     Weight 01/19/18 2204 185 lb (83.9 kg)     Height 01/19/18 2204 5\' 1"  (1.549 m)     Head Circumference --      Peak Flow --      Pain Score 01/19/18 2204 9     Pain Loc --      Pain Edu? --      Excl. in [place]? --      Constitutional: Alert and oriented. Well appearing and in no acute distress. Eyes: Conjunctivae are normal. PERRL. EOMI. Head: Atraumatic. ENT:      Ears:      Nose: No congestion/rhinnorhea.  Mouth/Throat: Mucous membranes are moist.  Neck: No stridor.  Cardiovascular: Normal rate, regular rhythm.  Good peripheral circulation.  Symmetric radial pulse bilaterally. Respiratory: Normal respiratory effort without tachypnea or retractions. Lungs CTAB. Good air entry to the bases with no decreased or absent breath sounds. Musculoskeletal: Full range of motion to all extremities. No gross deformities appreciated.  Full range of motion of right wrist but with pain.  Grip strength intact. Neurologic:  Normal speech and language. No gross focal neurologic deficits are appreciated.  Skin:  Skin is warm, dry and intact. No rash noted. Psychiatric: Mood and affect are normal. Speech and behavior are normal. Patient exhibits appropriate insight and judgement.   ____________________________________________   LABS (all labs ordered are listed, but only abnormal results are displayed)  Labs Reviewed - No data to display ____________________________________________  EKG   ____________________________________________  RADIOLOGY Robinette Haines, personally viewed and evaluated these images (plain radiographs) as part of my medical decision making, as well as reviewing the written report by the radiologist.  Dg Wrist Complete Right  Result Date: 01/19/2018 CLINICAL DATA:  [place] at work.  Pain. EXAM: RIGHT WRIST - COMPLETE 3+ VIEW COMPARISON:  RIGHT hand radiograph October 05, 2017 FINDINGS: No  acute fracture deformity or dislocation. Severe scaphoid -trapezium joint space narrowing with periarticular sclerosis and marginal spurring, unchanged. No destructive bony lesions. Soft tissue planes are non suspicious. IMPRESSION: 1. No acute fracture deformity or dislocation. 2. Severe lateral wrist osteoarthrosis. Electronically Signed   By: Elon Alas M.D.   On: 01/19/2018 22:40    ____________________________________________    PROCEDURES  Procedure(s) performed:    Procedures    Medications  oxyCODONE-acetaminophen (PERCOCET/ROXICET) 5-325 MG per tablet 1 tablet (1 tablet Oral Given 01/19/18 2315)  ibuprofen (ADVIL,MOTRIN) tablet 600 mg (600 mg Oral Given 01/19/18 2315)     ____________________________________________   INITIAL IMPRESSION / ASSESSMENT AND PLAN / ED COURSE  Pertinent labs & imaging results that were available during my care of the patient were reviewed by me and considered in my medical decision making (see chart for details).  Review of the [place] CSRS was performed in accordance of the [place] prior to dispensing any controlled drugs.   Patient presented to emergency department for evaluation of right wrist pain after fall today.  Wrist x-ray negative for acute processes.  Findings were discussed with patient.  Wrist was Ace wrapped.  Patient will be discharged home with prescriptions for ibuprofen. Patient is to follow up with primary care as directed. Patient is given ED precautions to return to the ED for any worsening or new symptoms.     ____________________________________________  FINAL CLINICAL IMPRESSION(S) / ED DIAGNOSES  Final diagnoses:  Injury of right wrist, initial encounter      NEW MEDICATIONS STARTED DURING THIS VISIT:  ED Discharge Orders         Ordered    ibuprofen (ADVIL,MOTRIN) 400 MG tablet  Every 6 hours PRN     01/19/18 2315              This chart was dictated using voice recognition software/Dragon.  Despite best efforts to proofread, errors can occur which can change the meaning. Any change was purely unintentional.    Laban Emperor, PA-C 01/19/18 2323    Carrie Mew, MD 01/19/18 [phone]

## 2018-02-18 ENCOUNTER — Ambulatory Visit: Payer: 59 | Admitting: Family Medicine

## 2018-02-18 ENCOUNTER — Encounter: Payer: Self-pay | Admitting: Family Medicine

## 2018-02-18 VITALS — BP 134/82 | HR 72 | Temp 98.3°F | Resp 16 | Ht 62.0 in | Wt 177.6 lb

## 2018-02-18 DIAGNOSIS — I700 Atherosclerosis of aorta: Secondary | ICD-10-CM

## 2018-02-18 DIAGNOSIS — J439 Emphysema, unspecified: Secondary | ICD-10-CM | POA: Diagnosis not present

## 2018-02-18 DIAGNOSIS — J3089 Other allergic rhinitis: Secondary | ICD-10-CM

## 2018-02-18 DIAGNOSIS — I10 Essential (primary) hypertension: Secondary | ICD-10-CM

## 2018-02-18 DIAGNOSIS — Z720 Tobacco use: Secondary | ICD-10-CM

## 2018-02-18 DIAGNOSIS — Z23 Encounter for immunization: Secondary | ICD-10-CM

## 2018-02-18 DIAGNOSIS — F3340 Major depressive disorder, recurrent, in remission, unspecified: Secondary | ICD-10-CM

## 2018-02-18 MED ORDER — FLUTICASONE PROPIONATE 50 MCG/ACT NA SUSP: 2.0000 | Freq: Every day | NASAL | 2 refills | Status: DC

## 2018-02-18 MED ORDER — LEVOCETIRIZINE DIHYDROCHLORIDE 5 MG PO TABS: 5.0000 mg | ORAL_TABLET | Freq: Every evening | ORAL | 2 refills | Status: DC

## 2018-02-18 MED ORDER — UMECLIDINIUM-VILANTEROL 62.5-25 MCG/INH IN AEPB: 1.0000 | INHALATION_SPRAY | Freq: Every day | RESPIRATORY_TRACT | 2 refills | Status: DC

## 2018-02-18 NOTE — Progress Notes (Signed)
Name: Nichole Wells   MRN: [id number]    DOB: 08-26-1960   Date:02/18/2018       Progress Note  Subjective  Chief Complaint  Chief Complaint  Patient presents with  . Medication Refill  . Hypertension    Headaches  . Hyperlipidemia  . Depression  . Tobacco Use    Down to 1/2 a day  . Back Pain    Hurts once in awhile if she lifts too much-used heating pad to help  . Hyperglycemia    HPI  Inflammatory arthritis: seeing Dr. Jefm Bryant, she is back on methotrexate and prn gabapentin.  She has intermittent joint swelling also erythema on both hands, but doing better now.   HTN: taking medication and bp still above goal, we will recheck before she leaves and if stays high we will change to Benicar hctz , no chest pain or palpitation   Hyperlipidemia/Aorta atherosclerosis: she is now on Crestor, she forgot to add Asprin 81 mg but will get it today. Atherosclerosis found on CT chest  Major Depression: she was diagnosed in her 12's, she has been taking Prozac for many years, tried other medication in the past. Initially seen by psychiatrist. Still in remission and doing well.   Hyperglycemia: she denies polyphagia, polydipsia or polyuria.Stable   Tobacco use/emphysema on CT chest:she has tried Chantix but was unable to quit, she is down from 1 pack daily for over 30 years, but is down to 7 cigarettes daily, she has a daily cough, that is productive and can be brown or yellow in color, up to date with CT chest, spirometry today was normal but willing to start medication for her symptoms   Low back pain with sciatica: off medication and is doing better nowShe has a history of herniated disc and DDD, never had surgery but had injections in the past  Perennial AR: going on for years, no seasonal variation, she has nasal congestion, facial pressure, post-nasal drainage , otc medication is not helping, we will try nasal steroid and also xyzal. We will consider referral for allergy testing if  symptoms of cough does not improve with medication  Patient Active Problem List   Diagnosis Date Noted  . Recurrent major depressive disorder, in remission ([place]) 02/18/2018  . Thoracic aorta atherosclerosis ([place]) 11/10/2017  . Emphysema lung ([place]) 11/10/2017  . Abnormal liver CT 11/10/2017  . Inflammatory arthritis 05/07/2017  . Inflamed nasal mucosa 05/07/2017  . Current tobacco use 05/07/2017  . Vitamin D deficiency 05/28/2016  . Surgical menopause on hormone replacement therapy 04/23/2016  . Status post laparoscopic assisted vaginal hysterectomy (LAVH) 01/28/2016  . Family history of ovarian cancer 11/13/2015  . Insomnia 03/27/2015  . Hypertension 08/30/2014  . Anxiety and depression 08/30/2014  . Cardiac murmur 08/30/2014  . HLD (hyperlipidemia) 08/30/2014  . DDD (degenerative disc disease), lumbar 07/26/2014    Past Surgical History:  Procedure Laterality Date  . ABDOMINAL HYSTERECTOMY    . BREAST BIOPSY Left    stereo- neg  . BREAST CYST ASPIRATION Right    neg  . BREAST SURGERY     benign biopsy  . CHOLECYSTECTOMY    . DILATION AND CURETTAGE OF UTERUS    . LAPAROSCOPIC LYSIS OF ADHESIONS  12/10/2015   Procedure: LAPAROSCOPIC LYSIS OF ADHESIONS;  Surgeon: Brayton Mars, MD;  Location: [facility] ORS;  Service: Gynecology;;  . LAPAROSCOPIC VAGINAL HYSTERECTOMY WITH SALPINGO OOPHORECTOMY Bilateral 01/28/2016   Procedure: LAPAROSCOPIC ASSISTED VAGINAL HYSTERECTOMY WITH SALPINGO OOPHORECTOMY;  Surgeon: Alanda Slim  Defrancesco, MD;  Location: [facility] ORS;  Service: Gynecology;  Laterality: Bilateral;  . LAPAROSCOPY N/A 12/10/2015   Procedure: LAPAROSCOPY DIAGNOSTIC WITH BIOPSIES;  Surgeon: Brayton Mars, MD;  Location: [facility] ORS;  Service: Gynecology;  Laterality: N/A;  . SHOULDER ARTHROSCOPY Right   . TUBAL LIGATION    . UTERINE FIBROID EMBOLIZATION      Family History  Problem Relation Age of Onset  . Cancer Mother        cervical, lung  . Lung cancer Mother   .  Heart disease Father   . Hypertension Father   . COPD Father   . Breast cancer Maternal Aunt   . Ovarian cancer Maternal Aunt   . Spina bifida Sister   . Diabetes Son   . Diabetes Maternal Grandmother   . Heart disease Maternal Grandmother   . Parkinson's disease Paternal Grandfather     Social History   Socioeconomic History  . Marital status: Married    Spouse name: [name]   . Number of children: 2  . Years of education: Not on file  . Highest education level: Some college, no degree  Occupational History  . Occupation: Freight forwarder  Social Needs  . Financial resource strain: Not hard at all  . Food insecurity:    Worry: Never true    Inability: Never true  . Transportation needs:    Medical: No    Non-medical: No  Tobacco Use  . Smoking status: Current Every Day Smoker    Packs/day: 1.00    Years: 37.00    Pack years: 37.00    Types: Cigarettes    Start date: 10/06/1987  . Smokeless tobacco: Never Used  Substance and Sexual Activity  . Alcohol use: Yes    Alcohol/week: 0.0 standard drinks    Comment: Occasional  . Drug use: No  . Sexual activity: Yes    Partners: Male    Birth control/protection: Surgical  Lifestyle  . Physical activity:    Days per week: 5 days    Minutes per session: 60 min  . Stress: Not at all  Relationships  . Social connections:    Talks on phone: Once a week    Gets together: Never    Attends religious service: Never    Active member of club or organization: No    Attends meetings of clubs or organizations: Never    Relationship status: Married  . Intimate partner violence:    Fear of current or ex partner: No    Emotionally abused: No    Physically abused: No    Forced sexual activity: No  Other Topics Concern  . Not on file  Social History Narrative   Married   Works full time   Has 2 grown children      Current Outpatient Medications:  .  amLODipine (NORVASC) 10 MG tablet, Take 1 tablet (10 mg total) by mouth daily.,  Disp: 90 tablet, Rfl: 1 .  FLUoxetine (PROZAC) 40 MG capsule, TAKE 1 CAPSULE BY MOUTH EVERY DAY., Disp: 90 capsule, Rfl: 1 .  gabapentin (NEURONTIN) 100 MG capsule, Take 100 mg by mouth as needed., Disp: , Rfl: 11 .  hydrochlorothiazide (HYDRODIURIL) 12.5 MG tablet, Take 1 tablet (12.5 mg total) by mouth daily., Disp: 90 tablet, Rfl: 1 .  methotrexate (RHEUMATREX) 2.5 MG tablet, Take 15 mg by mouth every 7 (seven) days., Disp: , Rfl: 1 .  metoprolol succinate (TOPROL-XL) 50 MG 24 hr tablet, Take 1 tablet (50 mg total) by  mouth daily. Take with or immediately following a meal., Disp: 90 tablet, Rfl: 1 .  rosuvastatin (CRESTOR) 20 MG tablet, Take 1 tablet (20 mg total) by mouth daily., Disp: 90 tablet, Rfl: 1 .  fluticasone (FLONASE) 50 MCG/ACT nasal spray, Place 2 sprays into both nostrils daily., Disp: 16 g, Rfl: 2 .  levocetirizine (XYZAL) 5 MG tablet, Take 1 tablet (5 mg total) by mouth every evening., Disp: 30 tablet, Rfl: 2 .  umeclidinium-vilanterol (ANORO ELLIPTA) 62.5-25 MCG/INH AEPB, Inhale 1 puff into the lungs daily., Disp: 60 each, Rfl: 2  Allergies  Allergen Reactions  . Hydroxychloroquine Itching    Pt take zyrtec to alleviate symptoms   . Penicillin G Hives    Has patient had a PCN reaction causing immediate rash, facial/tongue/throat swelling, SOB or lightheadedness with hypotension:unsure Has patient had a PCN reaction causing severe rash involving mucus membranes or skin necrosis:unsure Has patient had a PCN reaction that required hospitalization:unsure Has patient had a PCN reaction occurring within the last 10 years:No If all of the above answers are "NO", then may proceed with Cephalosporin use.     I personally reviewed active problem list, medication list, allergies, family history, social history with the patient/caregiver today.   ROS  Constitutional: Negative for fever or weight change.  Respiratory: Positive  for cough and shortness of breath with intense   activity    Cardiovascular: Negative for chest pain or palpitations.  Gastrointestinal: Negative for abdominal pain, no bowel changes.  Musculoskeletal: Negative for gait problem , positive for intermittent  joint swelling.  Skin: Negative for rash.  Neurological: Negative for dizziness, positive mild  headache.  No other specific complaints in a complete review of systems (except as listed in HPI above).  Objective  Vitals:   02/18/18 0937  BP: (!) 152/82  Pulse: 72  Resp: 16  Temp: 98.3 F (36.8 C)  TempSrc: Oral  SpO2: 98%  Weight: 177 lb 9.6 oz (80.6 kg)  Height: 5\' 2"  (1.575 m)    Body mass index is 32.48 kg/m.  Physical Exam  Constitutional: Patient appears well-developed and well-nourished. Obese No distress.  HEENT: head atraumatic, normocephalic, pupils equal and reactive to light, boggy turbinates,neck supple, throat within normal limits Cardiovascular: Normal rate, regular rhythm and normal heart sounds.  No murmur heard. No BLE edema. Pulmonary/Chest: Effort normal and breath sounds normal. No respiratory distress. Abdominal: Soft.  There is no tenderness. Psychiatric: Patient has a normal mood and affect. behavior is normal. Judgment and thought content normal.  PHQ2/9: Depression screen [facility] 2/9 02/18/2018 10/05/2017 01/20/2017 09/23/2016 07/28/2016  Decreased Interest 0 0 0 0 0  Down, Depressed, Hopeless 0 0 0 0 0  PHQ - 2 Score 0 0 0 0 0  Altered sleeping 0 - - - -  Tired, decreased energy 0 - - - -  Change in appetite 0 - - - -  Feeling bad or failure about yourself  0 - - - -  Trouble concentrating 0 - - - -  Moving slowly or fidgety/restless 0 - - - -  Suicidal thoughts 0 - - - -  PHQ-9 Score 0 - - - -  Difficult doing work/chores Not difficult at all - - - -     Fall Risk: Fall Risk  02/18/2018 10/05/2017 05/07/2017 01/20/2017 09/23/2016  Falls in the past year? 0 No No No No  Number falls in past yr: 0 - - - -  Injury with Fall? 0 - - - -  Functional Status Survey: Is the patient deaf or have difficulty hearing?: No Does the patient have difficulty seeing, even when wearing glasses/contacts?: Yes(glasses) Does the patient have difficulty concentrating, remembering, or making decisions?: No Does the patient have difficulty walking or climbing stairs?: No Does the patient have difficulty dressing or bathing?: No Does the patient have difficulty doing errands alone such as visiting a doctor's office or shopping?: No    Assessment & Plan  1. Pulmonary emphysema, unspecified emphysema type ([place])  - umeclidinium-vilanterol (ANORO ELLIPTA) 62.5-25 MCG/INH AEPB; Inhale 1 puff into the lungs daily.  Dispense: 60 each; Refill: 2  2. Need for 23-polyvalent pneumococcal polysaccharide vaccine  - Pneumococcal polysaccharide vaccine 23-valent greater than or equal to 2yo subcutaneous/IM  3. Tobacco use  - Spirometry with Graph  4. Recurrent major depressive disorder, in remission [facility])  Doing well with medication   5. Thoracic aorta atherosclerosis ([place])  On higher dose of statins  6. Perennial allergic rhinitis  - fluticasone (FLONASE) 50 MCG/ACT nasal spray; Place 2 sprays into both nostrils daily.  Dispense: 16 g; Refill: 2 - levocetirizine (XYZAL) 5 MG tablet; Take 1 tablet (5 mg total) by mouth every evening.  Dispense: 30 tablet; Refill: 2

## 2018-04-08 ENCOUNTER — Ambulatory Visit: Payer: 59 | Admitting: Family Medicine

## 2018-04-15 ENCOUNTER — Other Ambulatory Visit: Payer: Self-pay | Admitting: Family Medicine

## 2018-04-15 DIAGNOSIS — J439 Emphysema, unspecified: Secondary | ICD-10-CM

## 2018-04-15 NOTE — Telephone Encounter (Signed)
Refill request for general medication: Anoro Ellipta 62.5-25 mcg  Last office visit: 02/18/2018  Last physical exam: 10/05/2017  Follow-ups on file. 08/19/2018

## 2018-04-27 ENCOUNTER — Ambulatory Visit: Payer: Self-pay | Admitting: Family Medicine

## 2018-04-27 NOTE — Telephone Encounter (Signed)
Pt. Called,concerned because her son, who lives with her was tested for [facility] Virus today at his provider's office. He currently has a cough only.He tested negative for flu. Pt. Has a headache and stuffy nose. No cough or fever.States son is quarantined x 5 days until test results come back. Pt. Will self quarantine x 5 days until his results come back. Instructed if she develops cough, fever, shortness of breath to call back.   Reason for Disposition . [1] No COVID-19 EXPOSURE BUT [2] living with someone who was exposed and who has no fever or cough  Answer Assessment - Initial Assessment Questions 1. PLACE of CONTACT: "Where were you when you were exposed to COVID-19  (coronavirus disease 2019)?" (e.g., city, state, country)     Concern of son having possible exposure from [place] - tested today 2. TYPE of CONTACT: "How much contact was there?" (e.g., live in same house, work in same office, same school)     Just around her son 3. DATE of CONTACT: "When did you have contact with a coronavirus patient?" (e.g., days)     Lives with son 4. DURATION of CONTACT: "How long were you in contact with the COVID-19 (coronavirus disease) patient?" (e.g., a few seconds, passed by person, a few minutes, live with the patient)     Lives with pt. 5. SYMPTOMS: "Do you have any symptoms?" (e.g., fever, cough, breathing difficulty)     No fever, cough x 2 days 6. PREGNANCY OR POSTPARTUM: "Is there any chance you are pregnant?" "When was your last menstrual period?" "Did you deliver in the last 2 weeks?"     No 7. HIGH RISK: "Do you have any heart or lung problems? Do you have a weakened immune system?" (e.g., CHF, COPD, asthma, HIV positive, chemotherapy, renal failure, diabetes mellitus, sickle cell anemia)     HTN, rheumatoid arthritis.  Protocols used: CORONAVIRUS (COVID-19) EXPOSURE-A-AH

## 2018-05-07 ENCOUNTER — Other Ambulatory Visit: Payer: Self-pay | Admitting: Family Medicine

## 2018-05-07 DIAGNOSIS — E7800 Pure hypercholesterolemia, unspecified: Secondary | ICD-10-CM

## 2018-05-07 DIAGNOSIS — F3340 Major depressive disorder, recurrent, in remission, unspecified: Secondary | ICD-10-CM

## 2018-05-07 NOTE — Telephone Encounter (Signed)
Refill request for general medication. Prozac and Crestor to [facility]   Last office visit  02/18/2018    Follow up on 08/19/2018

## 2018-06-18 ENCOUNTER — Other Ambulatory Visit: Payer: Self-pay | Admitting: Family Medicine

## 2018-06-18 DIAGNOSIS — I10 Essential (primary) hypertension: Secondary | ICD-10-CM

## 2018-06-18 NOTE — Telephone Encounter (Signed)
Hypertension medication request: [facility]ml[facility]ipine to [facility]   Last office visit pertaining to hypertension: 02/18/2018   BP Readings from Last 3 Encounters:  02/18/18 134/82  01/19/18 (!) 155/50  11/13/17 (!) 144/76    Lab Results  Component Value Date   CREATININE 0.60 10/05/2017   BUN 14 10/05/2017   NA 139 10/05/2017   K 4.4 10/05/2017   CL 105 10/05/2017   CO2 24 10/05/2017     Follow up on 08/19/2018

## 2018-08-02 ENCOUNTER — Other Ambulatory Visit: Payer: Self-pay | Admitting: Family Medicine

## 2018-08-02 DIAGNOSIS — F3340 Major depressive disorder, recurrent, in remission, unspecified: Secondary | ICD-10-CM

## 2018-08-02 DIAGNOSIS — E7800 Pure hypercholesterolemia, unspecified: Secondary | ICD-10-CM

## 2018-08-12 ENCOUNTER — Other Ambulatory Visit: Payer: Self-pay | Admitting: Family Medicine

## 2018-08-12 DIAGNOSIS — I10 Essential (primary) hypertension: Secondary | ICD-10-CM

## 2018-08-19 ENCOUNTER — Encounter: Payer: Self-pay | Admitting: Family Medicine

## 2018-08-19 ENCOUNTER — Other Ambulatory Visit: Payer: Self-pay

## 2018-08-19 ENCOUNTER — Ambulatory Visit: Payer: 59 | Admitting: Family Medicine

## 2018-08-19 VITALS — BP 130/82 | HR 77 | Temp 97.1°F | Resp 16 | Ht 62.0 in | Wt 177.6 lb

## 2018-08-19 DIAGNOSIS — F3340 Major depressive disorder, recurrent, in remission, unspecified: Secondary | ICD-10-CM | POA: Diagnosis not present

## 2018-08-19 DIAGNOSIS — E7800 Pure hypercholesterolemia, unspecified: Secondary | ICD-10-CM

## 2018-08-19 DIAGNOSIS — I10 Essential (primary) hypertension: Secondary | ICD-10-CM

## 2018-08-19 DIAGNOSIS — J410 Simple chronic bronchitis: Secondary | ICD-10-CM

## 2018-08-19 DIAGNOSIS — J3089 Other allergic rhinitis: Secondary | ICD-10-CM

## 2018-08-19 DIAGNOSIS — Z131 Encounter for screening for diabetes mellitus: Secondary | ICD-10-CM

## 2018-08-19 DIAGNOSIS — I700 Atherosclerosis of aorta: Secondary | ICD-10-CM

## 2018-08-19 DIAGNOSIS — E559 Vitamin D deficiency, unspecified: Secondary | ICD-10-CM

## 2018-08-19 DIAGNOSIS — K760 Fatty (change of) liver, not elsewhere classified: Secondary | ICD-10-CM

## 2018-08-19 DIAGNOSIS — R002 Palpitations: Secondary | ICD-10-CM

## 2018-08-19 MED ORDER — VITAMIN D 50 MCG (2000 UT) PO CAPS: 1.0000 | ORAL_CAPSULE | Freq: Every day | ORAL | 0 refills | Status: DC

## 2018-08-19 MED ORDER — ASPIRIN EC 81 MG PO TBEC: 81.0000 mg | DELAYED_RELEASE_TABLET | Freq: Every day | ORAL | 0 refills | Status: DC

## 2018-08-19 MED ORDER — HYDROCHLOROTHIAZIDE 12.5 MG PO TABS: 12.5000 mg | ORAL_TABLET | Freq: Every day | ORAL | 0 refills | Status: DC

## 2018-08-19 MED ORDER — FLUOXETINE HCL 40 MG PO CAPS: 40.0000 mg | ORAL_CAPSULE | Freq: Every day | ORAL | 0 refills | Status: DC

## 2018-08-19 MED ORDER — AMLODIPINE BESYLATE 10 MG PO TABS: 10.0000 mg | ORAL_TABLET | Freq: Every day | ORAL | 1 refills | Status: DC

## 2018-08-19 MED ORDER — ROSUVASTATIN CALCIUM 20 MG PO TABS: 20.0000 mg | ORAL_TABLET | Freq: Every day | ORAL | 0 refills | Status: DC

## 2018-08-19 NOTE — Progress Notes (Signed)
Name: Nichole Wells   MRN: [id number]    DOB: 02/28/1960   Date:08/19/2018       Progress Note  Subjective  Chief Complaint  Chief Complaint  Patient presents with  . Anxiety  . Depression  . Hypertension  . Hyperlipidemia  . Allergies    Seasonal allergies have been bothering her, maybe causing a sinus infection. She feel pressure under her eyes.    HPI  Simple Chronic bronchitis: she has been using Anoro daily, still has intermittent morning cough, no wheezing or SOB She has tried Chantix but was unable to quit, she has a history of smoking 1 pack daily for over 30 years, but is down to 7 cigarettes daily  AR: over she has noticed more pressure on maxillary sinus but not using flonase and will resume it now, she is back on Zyrtec, she alternates.   Inflammatory arthritis: she missed follow up with Dr. Jefm Bryant , she has been off Methotrexate for months and has noticed joint aches, worse on wrist, feels stiff an sore, taking   Gabapentin prn .  She has intermittent joint swelling also erythema on both hands  HTN: taking medication and bp is at goal, no side effects, no chest pain she has noticed some  palpitation for a few seconds, not associated with SOB or chest pain , we will check TSH today, advised to cut down on caffeine   Hyperlipidemia/Aorta atherosclerosis: she is now on Crestor, she forgot to add Asprin 81 mg but will get it today. Atherosclerosis found on CT chest. We will recheck labs today   Major Depression: she was diagnosed in her 70's, she has been taking Prozac for many years, tried other medication in the past. Initially seen by psychiatrist.Still in remission and doing well. Unchanged   Hyperglycemia: she denies polyphagia, polydipsia or polyuria.we will recheck labs   Low back pain with sciatica: off medication and is doing better nowShe has a history of herniated disc and DDD, never had surgery but had injections in the past. Unchanged   Patient Active  Problem List   Diagnosis Date Noted  . Recurrent major depressive disorder, in remission ([place]) 02/18/2018  . Thoracic aorta atherosclerosis ([place]) 11/10/2017  . Emphysema lung ([place]) 11/10/2017  . Abnormal liver CT 11/10/2017  . Inflammatory arthritis 05/07/2017  . Inflamed nasal mucosa 05/07/2017  . Current tobacco use 05/07/2017  . Vitamin D deficiency 05/28/2016  . Surgical menopause on hormone replacement therapy 04/23/2016  . Status post laparoscopic assisted vaginal hysterectomy ([facility]) 01/28/2016  . Family history of ovarian cancer 11/13/2015  . Insomnia 03/27/2015  . Hypertension 08/30/2014  . Anxiety and depression 08/30/2014  . Cardiac murmur 08/30/2014  . HLD (hyperlipidemia) 08/30/2014  . DDD (degenerative disc disease), lumbar 07/26/2014    Past Surgical History:  Procedure Laterality Date  . ABDOMINAL HYSTERECTOMY    . BREAST BIOPSY Left    stereo- neg  . BREAST CYST ASPIRATION Right    neg  . BREAST SURGERY     benign biopsy  . CHOLECYSTECTOMY    . DILATION AND CURETTAGE OF UTERUS    . LAPAROSCOPIC LYSIS OF ADHESIONS  12/10/2015   Procedure: LAPAROSCOPIC LYSIS OF ADHESIONS;  Surgeon: Brayton Mars, MD;  Location: [facility] OR[facility];  Service: Gynecology;;  . LAPAROSCOPIC VAGINAL HYSTERECTOMY WITH SALPINGO OOPHORECTOMY Bilateral 01/28/2016   Procedure: LAPAROSCOPIC ASSISTED VAGINAL HYSTERECTOMY WITH SALPINGO OOPHORECTOMY;  Surgeon: Brayton Mars, MD;  Location: [facility] OR[facility];  Service: Gynecology;  Laterality: Bilateral;  . LAPAROSCOPY  N/A 12/10/2015   Procedure: LAPAROSCOPY DIAGNOSTIC WITH BIOPSIES;  Surgeon: Brayton Mars, MD;  Location: [facility] OR[facility];  Service: Gynecology;  Laterality: N/A;  . SHOULDER ARTHROSCOPY Right   . TUBAL LIGATION    . UTERINE FIBROID EMBOLIZATION      Family History  Problem Relation Age of Onset  . Cancer Mother        cervical, lung  . Lung cancer Mother   . Heart disease Father   . Hypertension Father   . COPD Father   .  Breast cancer Maternal Aunt   . Ovarian cancer Maternal Aunt   . Spina bifida Sister   . Diabetes Son   . Diabetes Maternal Grandmother   . Heart disease Maternal Grandmother   . Parkinson's disease Paternal Grandfather     Social History   Socioeconomic History  . Marital status: Married    Spouse name: [name]   . Number of children: 2  . Years of education: Not on file  . Highest education level: Some college, no degree  Occupational History  . Occupation: Freight forwarder  Social Needs  . Financial resource strain: Not hard at all  . Food insecurity    Worry: Never true    Inability: Never true  . Transportation needs    Medical: No    Non-medical: No  Tobacco Use  . Smoking status: Current Every Day Smoker    Packs/day: 1.00    Years: 37.00    Pack years: 37.00    Types: Cigarettes    Start date: 10/06/1987  . Smokeless tobacco: Never Used  Substance and Sexual Activity  . Alcohol use: Yes    Alcohol/week: 0.0 standard drinks    Comment: Occasional  . Drug use: No  . Sexual activity: Yes    Partners: Male    Birth control/protection: Surgical  Lifestyle  . Physical activity    Days per week: 5 days    Minutes per session: 60 min  . Stress: Not at all  Relationships  . Social Herbalist on phone: Once a week    Gets together: Never    Attends religious service: Never    Active member of club or organization: No    Attends meetings of clubs or organizations: Never    Relationship status: Married  . Intimate partner violence    Fear of current or ex partner: No    Emotionally abused: No    Physically abused: No    Forced sexual activity: No  Other Topics Concern  . Not on file  Social History Narrative   Married   Works full time   Has 2 grown children      Current Outpatient Medications:  .  amLODipine (NORVASC) 10 MG tablet, TAKE 1 TABLET BY MOUTH EVERY DAY, Disp: 90 tablet, Rfl: 0 .  ANORO ELLIPTA 62.5-25 MCG/INH AEPB, TAKE 1 PUFF BY MOUTH  EVERY DAY, Disp: 180 each, Rfl: 1 .  FLUoxetine (PROZAC) 40 MG capsule, TAKE 1 CAPSULE BY MOUTH EVERY DAY, Disp: 90 capsule, Rfl: 0 .  gabapentin (NEURONTIN) 100 MG capsule, Take 100 mg by mouth as needed., Disp: , Rfl: 11 .  hydrochlorothiazide (HYDRODIURIL) 12.5 MG tablet, TAKE 1 TABLET BY MOUTH EVERY DAY, Disp: 90 tablet, Rfl: 0 .  levocetirizine (XYZAL) 5 MG tablet, Take 1 tablet (5 mg total) by mouth every evening., Disp: 30 tablet, Rfl: 2 .  methotrexate (RHEUMATREX) 2.5 MG tablet, Take 15 mg by mouth every 7 (seven)  days., Disp: , Rfl: 1 .  metoprolol succinate (TOPROL-XL) 50 MG 24 hr tablet, Take 1 tablet (50 mg total) by mouth daily. Take with or immediately following a meal., Disp: 90 tablet, Rfl: 1 .  rosuvastatin (CRESTOR) 20 MG tablet, TAKE 1 TABLET BY MOUTH EVERY DAY, Disp: 90 tablet, Rfl: 0 .  fluticasone (FLONASE) 50 MCG/ACT nasal spray, Place 2 sprays into both nostrils daily. (Patient not taking: Reported on 08/19/2018), Disp: 16 g, Rfl: 2  Allergies  Allergen Reactions  . Hydroxychloroquine Itching    Pt take zyrtec to alleviate symptoms   . Penicillin G Hives    Has patient had a PCN reaction causing immediate rash, facial/tongue/throat swelling, SOB or lightheadedness with hypotension:unsure Has patient had a PCN reaction causing severe rash involving mucus membranes or skin necrosis:unsure Has patient had a PCN reaction that required hospitalization:unsure Has patient had a PCN reaction occurring within the last 10 years:No If all of the above answers are "NO", then may proceed with Cephalosporin use.     I personally reviewed active problem list, medication list, allergies, family history, social history with the patient/caregiver today.   ROS  Constitutional: Negative for fever or weight change.  Respiratory: Negative for cough and shortness of breath.   Cardiovascular: Negative for chest pain but has noticed palpitations - we will check TSH and also for anemia.   Gastrointestinal: Negative for abdominal pain, no bowel changes.  Musculoskeletal: Negative for gait problem , positive for  joint swelling.  Skin: Negative for rash.  Neurological: Negative for dizziness or headache.  No other specific complaints in a complete review of systems (except as listed in HPI above).  Objective  Vitals:   08/19/18 0839  BP: 130/82  Pulse: 77  Resp: 16  Temp: (!) 97.1 F (36.2 C)  TempSrc: Oral  SpO2: 97%  Weight: 177 lb 9.6 oz (80.6 kg)  Height: 5\' 2"  (1.575 m)    Body mass index is 32.48 kg/m.  Physical Exam  Constitutional: Patient appears well-developed and well-nourished. Obese  No distress.  HEENT: head atraumatic, normocephalic, pupils equal and reactive to light,neck supple, oral mucosa not done  Cardiovascular: Normal rate, regular rhythm and normal heart sounds.  No murmur heard. No BLE edema. Pulmonary/Chest: Effort normal and breath sounds normal. No respiratory distress. Abdominal: Soft.  There is no tenderness. Muscular Skeletal: mild tenderness on both wrists not synovitis  Psychiatric: Patient has a normal mood and affect. behavior is normal. Judgment and thought content normal.  PHQ2/9: Depression screen [facility] 2/9 08/19/2018 02/18/2018 10/05/2017 01/20/2017 09/23/2016  Decreased Interest 0 0 0 0 0  Down, Depressed, Hopeless 0 0 0 0 0  PHQ - 2 Score 0 0 0 0 0  Altered sleeping 0 0 - - -  Tired, decreased energy 0 0 - - -  Change in appetite 0 0 - - -  Feeling bad or failure about yourself  0 0 - - -  Trouble concentrating 0 0 - - -  Moving slowly or fidgety/restless 0 0 - - -  Suicidal thoughts 0 0 - - -  PHQ-9 Score 0 0 - - -  Difficult doing work/chores - Not difficult at all - - -    phq 9 is negative   Fall Risk: Fall Risk  08/19/2018 02/18/2018 10/05/2017 05/07/2017 01/20/2017  Falls in the past year? 0 0 No No No  Number falls in past yr: 0 0 - - -  Injury with Fall? 0 0 - - -  Functional Status Survey: Is the  patient deaf or have difficulty hearing?: No Does the patient have difficulty seeing, even when wearing glasses/contacts?: No Does the patient have difficulty concentrating, remembering, or making decisions?: No Does the patient have difficulty walking or climbing stairs?: No Does the patient have difficulty dressing or bathing?: No Does the patient have difficulty doing errands alone such as visiting a doctor's office or shopping?: No    Assessment & Plan  1. Recurrent major depressive disorder, in remission (HCC)  - FLUoxetine (PROZAC) 40 MG capsule; Take 1 capsule (40 mg total) by mouth daily.  Dispense: 90 capsule; Refill: 0  2. Essential hypertension  - hydrochlorothiazide (HYDRODIURIL) 12.5 MG tablet; Take 1 tablet (12.5 mg total) by mouth daily.  Dispense: 90 tablet; Refill: 0 - CBC with Differential/Platelet - COMPLETE METABOLIC PANEL WITH GFR - amLODipine (NORVASC) 10 MG tablet; Take 1 tablet (10 mg total) by mouth daily.  Dispense: 90 tablet; Refill: 1  3. Pure hypercholesterolemia  - rosuvastatin (CRESTOR) 20 MG tablet; Take 1 tablet (20 mg total) by mouth daily.  Dispense: 90 tablet; Refill: 0 - Lipid panel  4. Diabetes mellitus screening  - Hemoglobin A1c  5. Vitamin D deficiency  - Cholecalciferol (VITAMIN D) 50 MCG (2000 UT) CAPS; Take 1 capsule (2,000 Units total) by mouth daily.  Dispense: 30 capsule; Refill: 0  6. Perennial allergic rhinitis   7. Thoracic aorta atherosclerosis (HCC)  - rosuvastatin (CRESTOR) 20 MG tablet; Take 1 tablet (20 mg total) by mouth daily.  Dispense: 90 tablet; Refill: 0 - aspirin EC 81 MG tablet; Take 1 tablet (81 mg total) by mouth daily.  Dispense: 30 tablet; Refill: 0  8. Fatty liver  Discussed healthier diet

## 2018-08-20 LAB — HEMOGLOBIN A1C
Hgb A1c MFr Bld: 5.5 % of total Hgb (ref <5.7)
Mean Plasma Glucose: 111 (calc)
eAG (mmol/L): 6.2 (calc)

## 2018-08-20 LAB — COMPLETE METABOLIC PANEL WITH GFR
AG Ratio: 1.8 (calc) (ref 1.0–2.5)
ALT: 22 U/L (ref 6–29)
AST: 18 U/L (ref 10–35)
Albumin: 4.2 g/dL (ref 3.6–5.1)
Alkaline phosphatase (APISO): 93 U/L (ref 37–153)
BUN: 19 mg/dL (ref 7–25)
CO2: 25 mmol/L (ref 20–32)
Calcium: 9.9 mg/dL (ref 8.6–10.4)
Chloride: 105 mmol/L (ref 98–110)
Creat: 0.66 mg/dL (ref 0.50–1.05)
GFR, Est African American: 115 mL/min/{1.73_m2} (ref >=60)
GFR, Est Non African American: 99 mL/min/{1.73_m2} (ref >=60)
Globulin: 2.4 g/dL (calc) (ref 1.9–3.7)
Glucose, Bld: 99 mg/dL (ref 65–99)
Potassium: 4.5 mmol/L (ref 3.5–5.3)
Sodium: 138 mmol/L (ref 135–146)
Total Bilirubin: 0.3 mg/dL (ref 0.2–1.2)
Total Protein: 6.6 g/dL (ref 6.1–8.1)

## 2018-08-20 LAB — LIPID PANEL
Cholesterol: 209 mg/dL — ABNORMAL HIGH (ref <200)
HDL: 52 mg/dL (ref >=50)
LDL Cholesterol (Calc): 135 mg/dL (calc) — ABNORMAL HIGH
Non-HDL Cholesterol (Calc): 157 mg/dL (calc) — ABNORMAL HIGH (ref <130)
Total CHOL/HDL Ratio: 4 (calc) (ref <5.0)
Triglycerides: 109 mg/dL (ref <150)

## 2018-08-20 LAB — CBC WITH DIFFERENTIAL/PLATELET
Absolute Monocytes: 518 cells/uL (ref 200–950)
Basophils Absolute: 50 cells/uL (ref 0–200)
Basophils Relative: 0.7 %
Eosinophils Absolute: 274 cells/uL (ref 15–500)
Eosinophils Relative: 3.8 %
HCT: 43.1 % (ref 35.0–45.0)
Hemoglobin: 14.6 g/dL (ref 11.7–15.5)
Lymphs Abs: 2009 cells/uL (ref 850–3900)
MCH: 30.7 pg (ref 27.0–33.0)
MCHC: 33.9 g/dL (ref 32.0–36.0)
MCV: 90.7 fL (ref 80.0–100.0)
MPV: 9.8 fL (ref 7.5–12.5)
Monocytes Relative: 7.2 %
Neutro Abs: 4349 cells/uL (ref 1500–7800)
Neutrophils Relative %: 60.4 %
Platelets: 331 10*3/uL (ref 140–400)
RBC: 4.75 10*6/uL (ref 3.80–5.10)
RDW: 12.8 % (ref 11.0–15.0)
Total Lymphocyte: 27.9 %
WBC: 7.2 10*3/uL (ref 3.8–10.8)

## 2018-08-20 LAB — TSH: TSH: 1.36 mIU/L

## 2018-10-16 ENCOUNTER — Other Ambulatory Visit: Payer: Self-pay | Admitting: Family Medicine

## 2018-10-16 DIAGNOSIS — I10 Essential (primary) hypertension: Secondary | ICD-10-CM

## 2018-10-16 DIAGNOSIS — J439 Emphysema, unspecified: Secondary | ICD-10-CM

## 2018-10-19 NOTE — Telephone Encounter (Signed)
lvm to sch appt °

## 2018-10-25 ENCOUNTER — Ambulatory Visit: Payer: Self-pay | Admitting: *Deleted

## 2018-10-25 NOTE — Telephone Encounter (Signed)
Pt called stating that she is having "fluttering and heart palpitations" which started on 10/22/2018; she state that she has had this before however this has been having intermittently throughout the weekend; she denies chest pain, but activity increases the fluttering; she has not missed any doses of her BP meds, and she has not taken her BP since her last visit; recommendations made per nurse triage protocol; she verbalizes understanding but would like to see her PCP; the pt sees Dr Ancil Boozer, [facility], per Suanne Marker there is availability in the office today but she can offer the pt an appt on 10/26/2018 with Dr Ancil Boozer; pt notified and states that she will go to the ED; will route to office for notification.  Reason for Disposition . [1] Skipped or extra beat(s) AND [2] increases with exercise or exertion  Answer Assessment - Initial Assessment Questions 1. DESCRIPTION: "Please describe your heart rate or heart beat that you are having" (e.g., fast/slow, regular/irregular, skipped or extra beats, "palpitations")     palpitations 2. ONSET: "When did it start?" (Minutes, hours or days)      10/22/2018 3. DURATION: "How long does it last" (e.g., seconds, minutes, hours)      days 4. PATTERN "Does it come and go, or has it been constant since it started?"  "Does it get worse with exertion?"   "Are you feeling it now?"   Worse with exertion 5. TAP: "Using your hand, can you tap out what you are feeling on a chair or table in front of you, so that I can hear?" (Note: not all patients can do this)      Can not complete 6. HEART RATE: "Can you tell me your heart rate?" "How many beats in 15 seconds?"  (Note: not all patients can do this)   20 beats in 15 second 7. RECURRENT SYMPTOM: "Have you ever had this before?" If so, ask: "When was the last time?" and "What happened that time?"     In her 30s; BP  8. CAUSE: "What do you think is causing the palpitations?"     Not sure 9. CARDIAC HISTORY: "Do  you have any history of heart disease?" (e.g., heart attack, angina, bypass surgery, angioplasty, arrhythmia)     MVP, HTN 10. OTHER SYMPTOMS: "Do you have any other symptoms?" (e.g., dizziness, chest pain, sweating, difficulty breathing)       SOB due to start of emyphsema 11. PREGNANCY: "Is there any chance you are pregnant?" "When was your last menstrual period?"      No hysterectomy  Protocols used: HEART RATE AND HEARTBEAT QUESTIONS-A-AH

## 2018-11-11 ENCOUNTER — Telehealth: Payer: Self-pay | Admitting: *Deleted

## 2018-11-11 NOTE — Telephone Encounter (Signed)
Left message for patient to notify them that it is time to schedule annual low dose lung cancer screening CT scan. Instructed patient to call back to verify information prior to the scan being scheduled.  

## 2018-11-22 ENCOUNTER — Telehealth: Payer: Self-pay | Admitting: *Deleted

## 2018-11-22 ENCOUNTER — Encounter: Payer: Self-pay | Admitting: *Deleted

## 2018-11-22 NOTE — Telephone Encounter (Signed)
Left message for patient to notify them that it is time to schedule annual low dose lung cancer screening CT scan. Instructed patient to call back to verify information prior to the scan being scheduled.  

## 2018-12-29 ENCOUNTER — Other Ambulatory Visit: Payer: Self-pay | Admitting: Family Medicine

## 2018-12-29 DIAGNOSIS — I700 Atherosclerosis of aorta: Secondary | ICD-10-CM

## 2019-01-03 ENCOUNTER — Other Ambulatory Visit: Payer: Self-pay

## 2019-01-03 ENCOUNTER — Telehealth: Payer: 59 | Admitting: Family Medicine

## 2019-01-18 ENCOUNTER — Ambulatory Visit: Payer: 59 | Admitting: Family Medicine

## 2019-01-18 ENCOUNTER — Encounter: Payer: Self-pay | Admitting: Family Medicine

## 2019-01-18 ENCOUNTER — Other Ambulatory Visit: Payer: Self-pay

## 2019-01-18 VITALS — BP 126/68 | HR 73 | Temp 97.3°F | Resp 16 | Ht 62.0 in | Wt 181.4 lb

## 2019-01-18 DIAGNOSIS — E559 Vitamin D deficiency, unspecified: Secondary | ICD-10-CM

## 2019-01-18 DIAGNOSIS — J410 Simple chronic bronchitis: Secondary | ICD-10-CM

## 2019-01-18 DIAGNOSIS — F3340 Major depressive disorder, recurrent, in remission, unspecified: Secondary | ICD-10-CM | POA: Diagnosis not present

## 2019-01-18 DIAGNOSIS — I10 Essential (primary) hypertension: Secondary | ICD-10-CM

## 2019-01-18 DIAGNOSIS — I700 Atherosclerosis of aorta: Secondary | ICD-10-CM

## 2019-01-18 DIAGNOSIS — E7800 Pure hypercholesterolemia, unspecified: Secondary | ICD-10-CM | POA: Diagnosis not present

## 2019-01-18 DIAGNOSIS — M1990 Unspecified osteoarthritis, unspecified site: Secondary | ICD-10-CM

## 2019-01-18 DIAGNOSIS — G8929 Other chronic pain: Secondary | ICD-10-CM

## 2019-01-18 DIAGNOSIS — M545 Low back pain: Secondary | ICD-10-CM

## 2019-01-18 DIAGNOSIS — M5136 Other intervertebral disc degeneration, lumbar region: Secondary | ICD-10-CM

## 2019-01-18 MED ORDER — METAXALONE 800 MG PO TABS: 800.0000 mg | ORAL_TABLET | Freq: Three times a day (TID) | ORAL | 0 refills | Status: DC

## 2019-01-18 MED ORDER — METOPROLOL SUCCINATE ER 50 MG PO TB24: 50.0000 mg | ORAL_TABLET | Freq: Every day | ORAL | 1 refills | Status: DC

## 2019-01-18 MED ORDER — FLUOXETINE HCL 40 MG PO CAPS: 40.0000 mg | ORAL_CAPSULE | Freq: Every day | ORAL | 1 refills | Status: DC

## 2019-01-18 MED ORDER — ROSUVASTATIN CALCIUM 40 MG PO TABS: 40.0000 mg | ORAL_TABLET | Freq: Every day | ORAL | 1 refills | Status: DC

## 2019-01-18 MED ORDER — AMLODIPINE BESYLATE 10 MG PO TABS: 10.0000 mg | ORAL_TABLET | Freq: Every day | ORAL | 1 refills | Status: DC

## 2019-01-18 MED ORDER — HYDROCHLOROTHIAZIDE 12.5 MG PO TABS: 12.5000 mg | ORAL_TABLET | Freq: Every day | ORAL | 1 refills | Status: DC

## 2019-01-18 MED ORDER — GABAPENTIN 100 MG PO CAPS: 100.0000 mg | ORAL_CAPSULE | Freq: Every day | ORAL | 1 refills | Status: DC

## 2019-01-18 MED ORDER — VITAMIN D 50 MCG (2000 UT) PO CAPS: 1.0000 | ORAL_CAPSULE | Freq: Every day | ORAL | 0 refills | Status: DC

## 2019-01-18 NOTE — Progress Notes (Addendum)
Name: Nichole Wells   MRN: [id number]    DOB: [birth year]   Date:01/18/2019       Progress Note  Subjective  Chief Complaint  Chief Complaint  Patient presents with  . Medication Refill  . Hypertension    Denies any symptoms  . Depression  . Allergic Rhinitis   . Low back pain with sciatica  . Hyperlipidemia    HPI  Simple Chronic bronchitis: she has been using Anoro daily, still has intermittent morning cough, no wheezing or SOB She has tried Chantix but was unable to quit, she has a history of smoking 1 pack daily for over 30 years, she is trying to cut it down. She is due for repeat lung cancer screening and will contact [facility] today   AR: she is on Zyrtec daily, currently doing okay, no rhinorrhea or nasal congestion   Inflammatory arthritis: she missed follow up with Dr. Jefm Bryant , she has been off Methotrexate for over 6 months now, she has noticed pain on top of left foot, she has stiffness and pain on both hands   HTN: taking medication and bp is at goal, no side effects, no chest pain she has noticed some  palpitation about once a week, that lasts less than one minute, she has some sob with episodes. Discussed referral to cardiologist but she would like to hold off for now. She has a history of MVP but has not seen a cardiologist in many years   Hyperlipidemia/Aorta atherosclerosis: she is now on Crestor, she forgot to add Asprin 81 mg but will get it today. Atherosclerosis found on CT chest. Last LDL was still high at 135 and we will adjust dose of Crestor   Major Depression: she was diagnosed in her 6's, she has been taking Prozac for many years, tried other medication in the past. Initially seen by psychiatrist.Still in remission and doing well. She is even coping well in this time of COVID-19   Hyperglycemia: she denies polyphagia, polydipsia or polyuria.last A1C was normal   Low back pain with sciatica:off medication.She has a history of herniated disc  and DDD, never had surgery but had injections in the past. She states over the past 4 days she has noticed left lower back pain and has been [employer], feels like a catch  Patient Active Problem List   Diagnosis Date Noted  . Recurrent major depressive disorder, in remission ([place]) 02/18/2018  . Thoracic aorta atherosclerosis ([place]) 11/10/2017  . Emphysema lung ([place]) 11/10/2017  . Abnormal liver CT 11/10/2017  . Inflammatory arthritis 05/07/2017  . Inflamed nasal mucosa 05/07/2017  . Current tobacco use 05/07/2017  . Vitamin D deficiency 05/28/2016  . Surgical menopause on hormone replacement therapy 04/23/2016  . Status post laparoscopic assisted vaginal hysterectomy ([facility]) 01/28/2016  . Family history of ovarian cancer 11/13/2015  . Insomnia 03/27/2015  . Hypertension 08/30/2014  . Anxiety and depression 08/30/2014  . Cardiac murmur 08/30/2014  . HLD (hyperlipidemia) 08/30/2014  . DDD (degenerative disc disease), lumbar 07/26/2014    Past Surgical History:  Procedure Laterality Date  . ABDOMINAL HYSTERECTOMY    . BREAST BIOPSY Left    stereo- neg  . BREAST CYST ASPIRATION Right    neg  . BREAST SURGERY     benign biopsy  . CHOLECYSTECTOMY    . DILATION AND CURETTAGE OF UTERUS    . LAPAROSCOPIC LYSIS OF ADHESIONS  12/10/2015   Procedure: LAPAROSCOPIC LYSIS OF ADHESIONS;  Surgeon: Brayton Mars, MD;  Location: [facility] ORS;  Service: Gynecology;;  . LAPAROSCOPIC VAGINAL HYSTERECTOMY WITH SALPINGO OOPHORECTOMY Bilateral 01/28/2016   Procedure: LAPAROSCOPIC ASSISTED VAGINAL HYSTERECTOMY WITH SALPINGO OOPHORECTOMY;  Surgeon: Brayton Mars, MD;  Location: [facility] ORS;  Service: Gynecology;  Laterality: Bilateral;  . LAPAROSCOPY N/A 12/10/2015   Procedure: LAPAROSCOPY DIAGNOSTIC WITH BIOPSIES;  Surgeon: Brayton Mars, MD;  Location: [facility] ORS;  Service: Gynecology;  Laterality: N/A;  . SHOULDER ARTHROSCOPY Right   . TUBAL LIGATION    . UTERINE FIBROID EMBOLIZATION       Family History  Problem Relation Age of Onset  . Cancer Mother        cervical, lung  . Lung cancer Mother   . Heart disease Father   . Hypertension Father   . COPD Father   . Breast cancer Maternal Aunt   . Ovarian cancer Maternal Aunt   . Spina bifida Sister   . Diabetes Son   . Diabetes Maternal Grandmother   . Heart disease Maternal Grandmother   . Parkinson's disease Paternal Grandfather     Social History   Socioeconomic History  . Marital status: Married    Spouse name: [name]   . Number of children: 2  . Years of education: Not on file  . Highest education level: Some college, no degree  Occupational History  . Occupation: Freight forwarder  Social Needs  . Financial resource strain: Not hard at all  . Food insecurity    Worry: Never true    Inability: Never true  . Transportation needs    Medical: No    Non-medical: No  Tobacco Use  . Smoking status: Current Every Day Smoker    Packs/day: 1.00    Years: 37.00    Pack years: 37.00    Types: Cigarettes    Start date: 10/06/1987  . Smokeless tobacco: Never Used  Substance and Sexual Activity  . Alcohol use: Yes    Alcohol/week: 0.0 standard drinks    Comment: Occasional  . Drug use: No  . Sexual activity: Yes    Partners: Male    Birth control/protection: Surgical  Lifestyle  . Physical activity    Days per week: 5 days    Minutes per session: 60 min  . Stress: Not at all  Relationships  . Social connections    Talks on phone: More than three times a week    Gets together: Once a week    Attends religious service: Never    Active member of club or organization: No    Attends meetings of clubs or organizations: Never    Relationship status: Married  . Intimate partner violence    Fear of current or ex partner: No    Emotionally abused: No    Physically abused: No    Forced sexual activity: No  Other Topics Concern  . Not on file  Social History Narrative   Married   Works full time   Has 2 grown  children      Current Outpatient Medications:  .  amLODipine (NORVASC) 10 MG tablet, Take 1 tablet (10 mg total) by mouth daily., Disp: 90 tablet, Rfl: 1 .  ANORO ELLIPTA 62.5-25 MCG/INH AEPB, INHALE 1 PUFF BY MOUTH EVERY DAY, Disp: 180 each, Rfl: 1 .  aspirin 81 MG EC tablet, TAKE 1 TABLET BY MOUTH EVERY DAY, Disp: 30 tablet, Rfl: 0 .  FLUoxetine (PROZAC) 40 MG capsule, Take 1 capsule (40 mg total) by mouth daily., Disp: 90 capsule, Rfl: 1 .  gabapentin (NEURONTIN) 100 MG capsule, Take 1 capsule (100 mg total) by mouth at bedtime., Disp: 90 capsule, Rfl: 1 .  hydrochlorothiazide (HYDRODIURIL) 12.5 MG tablet, Take 1 tablet (12.5 mg total) by mouth daily., Disp: 90 tablet, Rfl: 1 .  metoprolol succinate (TOPROL-XL) 50 MG 24 hr tablet, Take 1 tablet (50 mg total) by mouth daily. Take with or immediately following a meal., Disp: 90 tablet, Rfl: 1 .  rosuvastatin (CRESTOR) 40 MG tablet, Take 1 tablet (40 mg total) by mouth daily., Disp: 90 tablet, Rfl: 1 .  Cholecalciferol (VITAMIN D) 50 MCG (2000 UT) CAPS, Take 1 capsule (2,000 Units total) by mouth daily., Disp: 30 capsule, Rfl: 0 .  fluticasone (FLONASE) 50 MCG/ACT nasal spray, Place 2 sprays into both nostrils daily. (Patient not taking: Reported on 01/18/2019), Disp: 16 g, Rfl: 2 .  metaxalone (SKELAXIN) 800 MG tablet, Take 1 tablet (800 mg total) by mouth 3 (three) times daily., Disp: 90 tablet, Rfl: 0  Allergies  Allergen Reactions  . Hydroxychloroquine Itching    Pt take zyrtec to alleviate symptoms   . Penicillin G Hives    Has patient had a PCN reaction causing immediate rash, facial/tongue/throat swelling, SOB or lightheadedness with hypotension:unsure Has patient had a PCN reaction causing severe rash involving mucus membranes or skin necrosis:unsure Has patient had a PCN reaction that required hospitalization:unsure Has patient had a PCN reaction occurring within the last 10 years:No If all of the above answers are "NO", then may  proceed with Cephalosporin use.     I personally reviewed active problem list, medication list, allergies, family history, social history, health maintenance with the patient/caregiver today.   ROS  Constitutional: Negative for fever or weight change.  Respiratory: positive  for cough but no  shortness of breath.   Cardiovascular: Negative for chest pain or palpitations.  Gastrointestinal: Negative for abdominal pain, no bowel changes.  Musculoskeletal: Negative for gait problem or joint swelling.  Skin: Negative for rash.  Neurological: Negative for dizziness or headache.  No other specific complaints in a complete review of systems (except as listed in HPI above).  Objective  Vitals:   01/18/19 1053  BP: 126/68  Pulse: 73  Resp: 16  Temp: (!) 97.3 F (36.3 C)  TempSrc: Temporal  SpO2: 95%  Weight: 181 lb 6.4 oz (82.3 kg)  Height: 5\' 2"  (1.575 m)    Body mass index is 33.18 kg/m.  Physical Exam  Constitutional: Patient appears well-developed and well-nourished. Obese  No distress.  HEENT: head atraumatic, normocephalic, pupils equal and reactive to light Cardiovascular: Normal rate, regular rhythm and normal heart sounds.  No murmur heard. No BLE edema. Pulmonary/Chest: Effort normal and breath sounds normal. No respiratory distress. Abdominal: Soft.  There is no tenderness. Psychiatric: Patient has a normal mood and affect. behavior is normal. Judgment and thought content normal. Muscular skeletal: pain during palpation of lumbar spine, negative straight leg raise   PHQ2/9: Depression screen [facility] 2/9 01/18/2019 08/19/2018 02/18/2018 10/05/2017 01/20/2017  Decreased Interest 0 0 0 0 0  Down, Depressed, Hopeless 0 0 0 0 0  PHQ - 2 Score 0 0 0 0 0  Altered sleeping 0 0 0 - -  Tired, decreased energy 0 0 0 - -  Change in appetite 0 0 0 - -  Feeling bad or failure about yourself  0 0 0 - -  Trouble concentrating 0 0 0 - -  Moving slowly or fidgety/restless 0 0 0 - -  Suicidal thoughts 0 0 0 - -  PHQ-9 Score 0 0 0 - -  Difficult doing work/chores Not difficult at all - Not difficult at all - -    phq 9 is negative   Fall Risk: Fall Risk  01/18/2019 08/19/2018 02/18/2018 10/05/2017 05/07/2017  Falls in the past year? 0 0 0 No No  Number falls in past yr: 0 0 0 - -  Injury with Fall? 0 0 0 - -     Functional Status Survey: Is the patient deaf or have difficulty hearing?: No Does the patient have difficulty seeing, even when wearing glasses/contacts?: Yes Does the patient have difficulty concentrating, remembering, or making decisions?: No Does the patient have difficulty walking or climbing stairs?: No Does the patient have difficulty dressing or bathing?: No Does the patient have difficulty doing errands alone such as visiting a doctor's office or shopping?: No   Assessment & Plan  1. Recurrent major depressive disorder, in remission (HCC)  - FLUoxetine (PROZAC) 40 MG capsule; Take 1 capsule (40 mg total) by mouth daily.  Dispense: 90 capsule; Refill: 1  2. Essential hypertension  - hydrochlorothiazide (HYDRODIURIL) 12.5 MG tablet; Take 1 tablet (12.5 mg total) by mouth daily.  Dispense: 90 tablet; Refill: 1 - metoprolol succinate (TOPROL-XL) 50 MG 24 hr tablet; Take 1 tablet (50 mg total) by mouth daily. Take with or immediately following a meal.  Dispense: 90 tablet; Refill: 1 - amLODipine (NORVASC) 10 MG tablet; Take 1 tablet (10 mg total) by mouth daily.  Dispense: 90 tablet; Refill: 1  3. Pure hypercholesterolemia  - rosuvastatin (CRESTOR) 40 MG tablet; Take 1 tablet (40 mg total) by mouth daily.  Dispense: 90 tablet; Refill: 1  4. Simple chronic bronchitis (HCC)  Continue Anoro , discussed importance of quitting smoking again   5. Thoracic aorta atherosclerosis ([facility])  - rosuvastatin (CRESTOR) 40 MG tablet; Take 1 tablet (40 mg total) by mouth daily.  Dispense: 90 tablet; Refill: 1  6. Vitamin D deficiency  - Cholecalciferol  (VITAMIN D) 50 MCG (2000 UT) CAPS; Take 1 capsule (2,000 Units total) by mouth daily.  Dispense: 30 capsule; Refill: 0  7. Chronic left-sided low back pain without sciatica  - gabapentin (NEURONTIN) 100 MG capsule; Take 1 capsule (100 mg total) by mouth at bedtime.  Dispense: 90 capsule; Refill: 1 - metaxalone (SKELAXIN) 800 MG tablet; Take 1 tablet (800 mg total) by mouth 3 (three) times daily.  Dispense: 90 tablet; Refill: 0  8. DDD (degenerative disc disease), lumbar  - gabapentin (NEURONTIN) 100 MG capsule; Take 1 capsule (100 mg total) by mouth at bedtime.  Dispense: 90 capsule; Refill: 1 - metaxalone (SKELAXIN) 800 MG tablet; Take 1 tablet (800 mg total) by mouth 3 (three) times daily.  Dispense: 90 tablet; Refill: 0  9. Inflammatory arthritis  She will call Dr[facility]

## 2019-01-18 NOTE — Patient Instructions (Signed)
Lung cancer screening , please call:   [facility] [phone]

## 2019-01-23 ENCOUNTER — Other Ambulatory Visit: Payer: Self-pay | Admitting: Family Medicine

## 2019-01-23 DIAGNOSIS — I700 Atherosclerosis of aorta: Secondary | ICD-10-CM

## 2019-01-28 ENCOUNTER — Other Ambulatory Visit: Payer: Self-pay | Admitting: Family Medicine

## 2019-01-28 DIAGNOSIS — I700 Atherosclerosis of aorta: Secondary | ICD-10-CM

## 2019-01-28 DIAGNOSIS — E7800 Pure hypercholesterolemia, unspecified: Secondary | ICD-10-CM

## 2019-04-08 ENCOUNTER — Other Ambulatory Visit: Payer: Self-pay | Admitting: Family Medicine

## 2019-04-08 DIAGNOSIS — J439 Emphysema, unspecified: Secondary | ICD-10-CM

## 2019-05-03 ENCOUNTER — Telehealth: Payer: Self-pay

## 2019-05-03 NOTE — Telephone Encounter (Signed)
Message left notifying patient that it is time to schedule the low dose lung cancer screening CT scan.  Instructed patient to return call to Shawn Perkins at [phone] to verify information prior to CT scan being scheduled.    

## 2019-05-19 ENCOUNTER — Encounter: Payer: Self-pay | Admitting: *Deleted

## 2019-07-11 ENCOUNTER — Other Ambulatory Visit: Payer: Self-pay | Admitting: Family Medicine

## 2019-07-11 DIAGNOSIS — G8929 Other chronic pain: Secondary | ICD-10-CM

## 2019-07-11 DIAGNOSIS — I700 Atherosclerosis of aorta: Secondary | ICD-10-CM

## 2019-07-11 DIAGNOSIS — M5136 Other intervertebral disc degeneration, lumbar region: Secondary | ICD-10-CM

## 2019-07-11 DIAGNOSIS — F3340 Major depressive disorder, recurrent, in remission, unspecified: Secondary | ICD-10-CM

## 2019-07-11 DIAGNOSIS — I10 Essential (primary) hypertension: Secondary | ICD-10-CM

## 2019-07-11 DIAGNOSIS — E7800 Pure hypercholesterolemia, unspecified: Secondary | ICD-10-CM

## 2019-07-12 NOTE — Telephone Encounter (Signed)
LVM for patient to call back just to let us know if she has enough medication to last until her appointment with Dr. Ancil Boozer on 07/20/19.

## 2019-07-20 ENCOUNTER — Other Ambulatory Visit: Payer: Self-pay

## 2019-07-20 ENCOUNTER — Ambulatory Visit: Payer: 59 | Admitting: Family Medicine

## 2019-07-20 ENCOUNTER — Telehealth: Payer: Self-pay | Admitting: *Deleted

## 2019-07-20 ENCOUNTER — Encounter: Payer: Self-pay | Admitting: Family Medicine

## 2019-07-20 VITALS — BP 130/70 | HR 82 | Temp 96.8°F | Resp 16 | Ht 61.5 in | Wt 179.5 lb

## 2019-07-20 DIAGNOSIS — Z720 Tobacco use: Secondary | ICD-10-CM

## 2019-07-20 DIAGNOSIS — J441 Chronic obstructive pulmonary disease with (acute) exacerbation: Secondary | ICD-10-CM

## 2019-07-20 DIAGNOSIS — I700 Atherosclerosis of aorta: Secondary | ICD-10-CM

## 2019-07-20 DIAGNOSIS — Z131 Encounter for screening for diabetes mellitus: Secondary | ICD-10-CM

## 2019-07-20 DIAGNOSIS — Z1211 Encounter for screening for malignant neoplasm of colon: Secondary | ICD-10-CM

## 2019-07-20 DIAGNOSIS — E559 Vitamin D deficiency, unspecified: Secondary | ICD-10-CM

## 2019-07-20 DIAGNOSIS — E7800 Pure hypercholesterolemia, unspecified: Secondary | ICD-10-CM

## 2019-07-20 DIAGNOSIS — Z122 Encounter for screening for malignant neoplasm of respiratory organs: Secondary | ICD-10-CM

## 2019-07-20 DIAGNOSIS — J432 Centrilobular emphysema: Secondary | ICD-10-CM

## 2019-07-20 DIAGNOSIS — I10 Essential (primary) hypertension: Secondary | ICD-10-CM

## 2019-07-20 DIAGNOSIS — Z0000 Encounter for general adult medical examination without abnormal findings: Secondary | ICD-10-CM | POA: Diagnosis not present

## 2019-07-20 DIAGNOSIS — Z87891 Personal history of nicotine dependence: Secondary | ICD-10-CM

## 2019-07-20 DIAGNOSIS — M545 Low back pain: Secondary | ICD-10-CM

## 2019-07-20 DIAGNOSIS — Z1231 Encounter for screening mammogram for malignant neoplasm of breast: Secondary | ICD-10-CM

## 2019-07-20 DIAGNOSIS — M5136 Other intervertebral disc degeneration, lumbar region: Secondary | ICD-10-CM

## 2019-07-20 DIAGNOSIS — J3089 Other allergic rhinitis: Secondary | ICD-10-CM

## 2019-07-20 DIAGNOSIS — F3340 Major depressive disorder, recurrent, in remission, unspecified: Secondary | ICD-10-CM

## 2019-07-20 DIAGNOSIS — G8929 Other chronic pain: Secondary | ICD-10-CM

## 2019-07-20 DIAGNOSIS — K760 Fatty (change of) liver, not elsewhere classified: Secondary | ICD-10-CM

## 2019-07-20 DIAGNOSIS — M1990 Unspecified osteoarthritis, unspecified site: Secondary | ICD-10-CM

## 2019-07-20 MED ORDER — PREDNISONE 20 MG PO TABS: 20.0000 mg | ORAL_TABLET | Freq: Two times a day (BID) | ORAL | 0 refills | Status: DC

## 2019-07-20 MED ORDER — FLUTICASONE PROPIONATE 50 MCG/ACT NA SUSP: 2.0000 | Freq: Every day | NASAL | 2 refills | Status: DC

## 2019-07-20 MED ORDER — METOPROLOL SUCCINATE ER 50 MG PO TB24: 50.0000 mg | ORAL_TABLET | Freq: Every day | ORAL | 1 refills | Status: DC

## 2019-07-20 MED ORDER — TRELEGY ELLIPTA 100-62.5-25 MCG/INH IN AEPB: 1.0000 | INHALATION_SPRAY | Freq: Every day | RESPIRATORY_TRACT | 5 refills | Status: DC

## 2019-07-20 MED ORDER — LEVOFLOXACIN 500 MG PO TABS: 500.0000 mg | ORAL_TABLET | Freq: Every day | ORAL | 0 refills | Status: DC

## 2019-07-20 MED ORDER — AMLODIPINE BESYLATE 10 MG PO TABS: 10.0000 mg | ORAL_TABLET | Freq: Every day | ORAL | 1 refills | Status: DC

## 2019-07-20 MED ORDER — HYDROCHLOROTHIAZIDE 12.5 MG PO TABS: 12.5000 mg | ORAL_TABLET | Freq: Every day | ORAL | 1 refills | Status: DC

## 2019-07-20 MED ORDER — GABAPENTIN 100 MG PO CAPS: 100.0000 mg | ORAL_CAPSULE | Freq: Every day | ORAL | 1 refills | Status: DC

## 2019-07-20 MED ORDER — ROSUVASTATIN CALCIUM 40 MG PO TABS: 40.0000 mg | ORAL_TABLET | Freq: Every day | ORAL | 1 refills | Status: DC

## 2019-07-20 MED ORDER — FLUOXETINE HCL 40 MG PO CAPS: 40.0000 mg | ORAL_CAPSULE | Freq: Every day | ORAL | 1 refills | Status: DC

## 2019-07-20 NOTE — Progress Notes (Signed)
Name: Nichole Wells   MRN: [id number]    DOB: 1960/06/23   Date:07/20/2019       Progress Note  Subjective  Chief Complaint  Chief Complaint  Patient presents with  . Annual Exam  . Ear Problem    Her left ear is clogged and it makes her off balance. She had a sinus infection 3 weeks ago and she was taking doxycyline and benzonate.    HPI  Patient presents for annual CPE and follow up  Emphysema with COPD flare : she has been using Anoro daily, cough has been much worse over the past few weeks when she developed URI symptoms, she has noticed wheezing and some sob, she had a tele health visit and was given tessalon Perles and doxycycline but is still having symptoms and facial pressure, left ear pain, still constantly coughing . Shehas a history of smoking1 pack daily for over 30 years, she is trying to cut it down. She is due for repeat lung cancer screening and will contact [facility] today . She denies lack of taste, appetite, nausea, vomiting.   Inflammatory arthritis:she missed follow up with Dr. Jefm Bryant , she has been off Methotrexate for over 12  months now, she has stiffness and pain on both hands . Explained importance of follow up with him   HTN: taking medicationand bp is at goal, no side effects, no chest pain except for intermittent sharp sensation but resolves after one second, palpitations has resolved. She has a history of MVP but has not seen a cardiologist in many years   Hyperlipidemia/Aorta atherosclerosis: she is now on Crestor and aspirin daily now.  Atherosclerosis found on CT chest. We will recheck labs today   Major Depression: she was diagnosed in her 43's, she has been taking Prozac for many years, tried other medication in the past. Initially seen by psychiatrist.Still in remission and doing well. She does not want to stop medication because she had severe episode that required hospitalization in her 45's for suicide thoughts.   Hyperglycemia: she  denies polyphagia, polydipsia or polyuria.last A1C was normal , we will recheck labs today   Low back pain with sciatica:off medication.She has a history of herniated disc and DDD, never had surgery but had injections in the past. She has been working non stop - 3 rd week now and is noticing worsening of symptoms, difficulty raising right leg at the end of the day because it causes left lower back    Diet: balanced diet  Exercise: walks at work only  USPSTF grade A and B recommendations    Office Visit from 07/20/2019 in [facility]  AUDIT-C Score  0     Depression: Phq 9 is  negative Depression screen [facility] 2/9 07/20/2019 01/18/2019 08/19/2018 02/18/2018 10/05/2017  Decreased Interest 0 0 0 0 0  Down, Depressed, Hopeless 0 0 0 0 0  PHQ - 2 Score 0 0 0 0 0  Altered sleeping 0 0 0 0 -  Tired, decreased energy 0 0 0 0 -  Change in appetite 0 0 0 0 -  Feeling bad or failure about yourself  0 0 0 0 -  Trouble concentrating 0 0 0 0 -  Moving slowly or fidgety/restless 0 0 0 0 -  Suicidal thoughts 0 0 0 0 -  PHQ-9 Score 0 0 0 0 -  Difficult doing work/chores - Not difficult at all - Not difficult at all -   Hypertension: BP Readings from  Last 3 Encounters:  07/20/19 130/70  01/18/19 126/68  08/19/18 130/82   Obesity: Wt Readings from Last 3 Encounters:  07/20/19 179 lb 8 oz (81.4 kg)  01/18/19 181 lb 6.4 oz (82.3 kg)  08/19/18 177 lb 9.6 oz (80.6 kg)   BMI Readings from Last 3 Encounters:  07/20/19 33.37 kg/m  01/18/19 33.18 kg/m  08/19/18 32.48 kg/m     Hep C Screening: up to date STD testing and prevention (HIV/chl/gon/syphilis): not interested  Intimate partner violence: negative screen  Sexual History (Partners/Practices/Protection from [employer] hx STI/Pregnancy Plans):married, one partner  Pain during Intercourse: none  Menstrual History/LMP/Abnormal Bleeding: s/p hysterectomy for DUB Incontinence Symptoms: none   Breast cancer:  - Last Mammogram:  05/2011  - BRCA gene screening: aunt had breast cancer   Osteoporosis: Discussed high calcium and vitamin D supplementation, weight bearing exercises  Cervical cancer screening: s/ p hysterectomy   Skin cancer: Discussed monitoring for atypical lesions  Colorectal cancer: referral placed    Lung cancer:  Low Dose CT Chest recommended if Age 50-80 years, 30 pack-year currently smoking OR have quit w/in 15years. Patient does qualify.   ECG: 2017   Advanced Care Planning: A voluntary discussion about advance care planning including the explanation and discussion of advance directives.  Discussed health care proxy and Living will, and the patient was able to identify a health care proxy as husband .  Patient does not have a living will at present time.  Lipids: Lab Results  Component Value Date   CHOL 209 (H) 08/19/2018   CHOL 200 (H) 10/05/2017   CHOL 161 06/27/2016   Lab Results  Component Value Date   HDL 52 08/19/2018   HDL 57 10/05/2017   HDL 55 06/27/2016   Lab Results  Component Value Date   LDLCALC 135 (H) 08/19/2018   LDLCALC 124 (H) 10/05/2017   LDLCALC 83 06/27/2016   Lab Results  Component Value Date   TRIG 109 08/19/2018   TRIG 86 10/05/2017   TRIG 114 06/27/2016   Lab Results  Component Value Date   CHOLHDL 4.0 08/19/2018   CHOLHDL 3.5 10/05/2017   CHOLHDL 2.9 06/27/2016   No results found for: LDLDIRECT  Glucose: Glucose  Date Value Ref Range Status  04/27/2012 106 (H) 65 - 99 mg/dL Final   Glucose, Bld  Date Value Ref Range Status  08/19/2018 99 65 - 99 mg/dL Final    Comment:    .            Fasting reference interval .   10/05/2017 92 65 - 99 mg/dL Final    Comment:    .            Fasting reference interval .   06/27/2016 86 65 - 99 mg/dL Final    Patient Active Problem List   Diagnosis Date Noted  . Recurrent major depressive disorder, in remission ([place]) 02/18/2018  . Thoracic aorta atherosclerosis ([place]) 11/10/2017  .  Centrilobular emphysema ([place]) 11/10/2017  . Abnormal liver CT 11/10/2017  . Inflammatory arthritis 05/07/2017  . Current tobacco use 05/07/2017  . Vitamin D deficiency 05/28/2016  . Surgical menopause on hormone replacement therapy 04/23/2016  . Status post laparoscopic assisted vaginal hysterectomy ([facility]) 01/28/2016  . Family history of ovarian cancer 11/13/2015  . Insomnia 03/27/2015  . Hypertension 08/30/2014  . Anxiety and depression 08/30/2014  . Cardiac murmur 08/30/2014  . HLD (hyperlipidemia) 08/30/2014  . DDD (degenerative disc disease), lumbar 07/26/2014    Past  Surgical History:  Procedure Laterality Date  . ABDOMINAL HYSTERECTOMY    . BREAST BIOPSY Left    stereo- neg  . BREAST CYST ASPIRATION Right    neg  . BREAST SURGERY     benign biopsy  . CHOLECYSTECTOMY    . DILATION AND CURETTAGE OF UTERUS    . LAPAROSCOPIC LYSIS OF ADHESIONS  12/10/2015   Procedure: LAPAROSCOPIC LYSIS OF ADHESIONS;  Surgeon: Brayton Mars, MD;  Location: [facility] ORS;  Service: Gynecology;;  . LAPAROSCOPIC VAGINAL HYSTERECTOMY WITH SALPINGO OOPHORECTOMY Bilateral 01/28/2016   Procedure: LAPAROSCOPIC ASSISTED VAGINAL HYSTERECTOMY WITH SALPINGO OOPHORECTOMY;  Surgeon: Brayton Mars, MD;  Location: [facility] ORS;  Service: Gynecology;  Laterality: Bilateral;  . LAPAROSCOPY N/A 12/10/2015   Procedure: LAPAROSCOPY DIAGNOSTIC WITH BIOPSIES;  Surgeon: Brayton Mars, MD;  Location: [facility] ORS;  Service: Gynecology;  Laterality: N/A;  . SHOULDER ARTHROSCOPY Right   . TUBAL LIGATION    . UTERINE FIBROID EMBOLIZATION      Family History  Problem Relation Age of Onset  . Cancer Mother        cervical, lung  . Lung cancer Mother   . Heart disease Father   . Hypertension Father   . COPD Father   . Breast cancer Maternal Aunt   . Ovarian cancer Maternal Aunt   . Spina bifida Sister   . Diabetes Son   . Diabetes Maternal Grandmother   . Heart disease Maternal Grandmother   .  Parkinson's disease Paternal Grandfather     Social History   Socioeconomic History  . Marital status: Married    Spouse name: [name]   . Number of children: 2  . Years of education: Not on file  . Highest education level: Some college, no degree  Occupational History  . Occupation: Freight forwarder  Tobacco Use  . Smoking status: Current Every Day Smoker    Packs/day: 1.00    Years: 37.00    Pack years: 37.00    Types: Cigarettes    Start date: 10/06/1987  . Smokeless tobacco: Never Used  Substance and Sexual Activity  . Alcohol use: Yes    Alcohol/week: 0.0 standard drinks    Comment: Occasional  . Drug use: No  . Sexual activity: Yes    Partners: Male    Birth control/protection: Surgical  Other Topics Concern  . Not on file  Social History Narrative   Married   Works full time   Has 2 grown children    Social Determinants of Radio broadcast assistant Strain: Low Risk   . Difficulty of Paying Living Expenses: Not hard at all  Food Insecurity: No Food Insecurity  . Worried About Charity fundraiser in the Last Year: Never true  . Ran Out of Food in the Last Year: Never true  Transportation Needs: No Transportation Needs  . Lack of Transportation (Medical): No  . Lack of Transportation (Non-Medical): No  Physical Activity: Inactive  . Days of Exercise per Week: 2 days  . Minutes of Exercise per Session: 0 min  Stress: No Stress Concern Present  . Feeling of Stress : Not at all  Social Connections: Somewhat Isolated  . Frequency of Communication with Friends and Family: Twice a week  . Frequency of Social Gatherings with Friends and Family: Once a week  . Attends Religious Services: Never  . Active Member of Clubs or Organizations: No  . Attends Archivist Meetings: Never  . Marital Status: Married  Human resources officer  Violence: Not At Risk  . Fear of Current or Ex-Partner: No  . Emotionally Abused: No  . Physically Abused: No  . Sexually Abused: No      Current Outpatient Medications:  .  amLODipine (NORVASC) 10 MG tablet, Take 1 tablet (10 mg total) by mouth daily., Disp: 90 tablet, Rfl: 1 .  aspirin 81 MG EC tablet, TAKE 1 TABLET BY MOUTH EVERY DAY, Disp: 30 tablet, Rfl: 5 .  Cholecalciferol (VITAMIN D) 50 MCG (2000 UT) CAPS, Take 1 capsule (2,000 Units total) by mouth daily., Disp: 30 capsule, Rfl: 0 .  FLUoxetine (PROZAC) 40 MG capsule, Take 1 capsule (40 mg total) by mouth daily., Disp: 90 capsule, Rfl: 1 .  fluticasone (FLONASE) 50 MCG/ACT nasal spray, Place 2 sprays into both nostrils daily., Disp: 16 g, Rfl: 2 .  gabapentin (NEURONTIN) 100 MG capsule, Take 1 capsule (100 mg total) by mouth at bedtime., Disp: 90 capsule, Rfl: 1 .  hydrochlorothiazide (HYDRODIURIL) 12.5 MG tablet, Take 1 tablet (12.5 mg total) by mouth daily., Disp: 90 tablet, Rfl: 1 .  metaxalone (SKELAXIN) 800 MG tablet, Take 1 tablet (800 mg total) by mouth 3 (three) times daily., Disp: 90 tablet, Rfl: 0 .  metoprolol succinate (TOPROL-XL) 50 MG 24 hr tablet, Take 1 tablet (50 mg total) by mouth daily. Take with or immediately following a meal., Disp: 90 tablet, Rfl: 1 .  rosuvastatin (CRESTOR) 40 MG tablet, Take 1 tablet (40 mg total) by mouth daily., Disp: 90 tablet, Rfl: 1 .  Fluticasone-Umeclidin-Vilant (TRELEGY ELLIPTA) 100-62.5-25 MCG/INH AEPB, Inhale 1 puff into the lungs daily., Disp: 60 each, Rfl: 5 .  levofloxacin (LEVAQUIN) 500 MG tablet, Take 1 tablet (500 mg total) by mouth daily., Disp: 7 tablet, Rfl: 0  Allergies  Allergen Reactions  . Hydroxychloroquine Itching    Pt take zyrtec to alleviate symptoms   . Penicillin G Hives    Has patient had a PCN reaction causing immediate rash, facial/tongue/throat swelling, SOB or lightheadedness with hypotension:unsure Has patient had a PCN reaction causing severe rash involving mucus membranes or skin necrosis:unsure Has patient had a PCN reaction that required hospitalization:unsure Has patient had a PCN  reaction occurring within the last 10 years:No If all of the above answers are "NO", then may proceed with Cephalosporin use.      ROS  Constitutional: Negative for fever or weight change.  Respiratory: Positive  for cough and shortness of breath with activity .   Cardiovascular: Negative for chest pain or palpitations.  Gastrointestinal: Negative for abdominal pain, no bowel changes.  Musculoskeletal: Negative for gait problem or joint swelling.  Skin: Negative for rash.  Neurological: Negative for dizziness or headache.  No other specific complaints in a complete review of systems (except as listed in HPI above).  Objective  Vitals:   07/20/19 0804  BP: 130/70  Pulse: 82  Resp: 16  Temp: (!) 96.8 F (36 C)  TempSrc: Temporal  SpO2: 94%  Weight: 179 lb 8 oz (81.4 kg)  Height: 5' 1.5" (1.562 m)    Body mass index is 33.37 kg/m.  Physical Exam  Constitutional: Patient appears well-developed and well-nourished.Obese  No distress.  HENT: Head: Normocephalic and atraumatic. Ears: B TMs ok, no erythema or effusion; Nose: Nose normal. Mouth/Throat: Oropharynx is clear and moist. No oropharyngeal exudate.  Eyes: Conjunctivae and EOM are normal. Pupils are equal, round, and reactive to light. No scleral icterus.  Neck: Normal range of motion. Neck supple. No JVD present. No  thyromegaly present.  Cardiovascular: Normal rate, regular rhythm and normal heart sounds.  No murmur heard. No BLE edema. Pulmonary/Chest: Effort normal and breath sounds normal. No respiratory distress. Abdominal: Soft. Bowel sounds are normal, no distension. There is no tenderness. no masses Breast: no lumps or masses, no nipple discharge or rashes FEMALE GENITALIA:  Not done RECTAL: not done Musculoskeletal: Normal range of motion, no joint effusions. No gross deformities Neurological: he is alert and oriented to person, place, and time. No cranial nerve deficit. Coordination, balance, strength, speech  and gait are normal.  Skin: Skin is warm and dry. No rash noted. No erythema.  Psychiatric: Patient has a normal mood and affect. behavior is normal. Judgment and thought content normal.  Fall Risk: Fall Risk  07/20/2019 01/18/2019 08/19/2018 02/18/2018 10/05/2017  Falls in the past year? 0 0 0 0 No  Number falls in past yr: 0 0 0 0 -  Injury with Fall? 0 0 0 0 -     Functional Status Survey: Is the patient deaf or have difficulty hearing?: No Does the patient have difficulty seeing, even when wearing glasses/contacts?: No Does the patient have difficulty concentrating, remembering, or making decisions?: No Does the patient have difficulty walking or climbing stairs?: No Does the patient have difficulty dressing or bathing?: No Does the patient have difficulty doing errands alone such as visiting a doctor's office or shopping?: No   Assessment & Plan  1. Thoracic aorta atherosclerosis (HCC)  - Lipid panel - rosuvastatin (CRESTOR) 40 MG tablet; Take 1 tablet (40 mg total) by mouth daily.  Dispense: 90 tablet; Refill: 1  2. Essential hypertension  - COMPLETE METABOLIC PANEL WITH GFR - metoprolol succinate (TOPROL-XL) 50 MG 24 hr tablet; Take 1 tablet (50 mg total) by mouth daily. Take with or immediately following a meal.  Dispense: 90 tablet; Refill: 1 - hydrochlorothiazide (HYDRODIURIL) 12.5 MG tablet; Take 1 tablet (12.5 mg total) by mouth daily.  Dispense: 90 tablet; Refill: 1 - amLODipine (NORVASC) 10 MG tablet; Take 1 tablet (10 mg total) by mouth daily.  Dispense: 90 tablet; Refill: 1  3. Pure hypercholesterolemia  - Lipid panel - rosuvastatin (CRESTOR) 40 MG tablet; Take 1 tablet (40 mg total) by mouth daily.  Dispense: 90 tablet; Refill: 1  4. Well adult exam   5. Vitamin D deficiency  - VITAMIN D 25 Hydroxy (Vit-D Deficiency, Fractures)  6. Inflammatory arthritis  - CBC with Differential/Platelet - Sedimentation rate - C-reactive protein  7. Fatty liver  -  Ambulatory referral to Gastroenterology  8. DDD (degenerative disc disease), lumbar  - gabapentin (NEURONTIN) 100 MG capsule; Take 1 capsule (100 mg total) by mouth at bedtime.  Dispense: 90 capsule; Refill: 1  9. Diabetes mellitus screening  - Hemoglobin A1c  10. Tobacco use  Discussed importance of quitting smoking   11. Recurrent major depressive disorder, in remission (HCC)  - FLUoxetine (PROZAC) 40 MG capsule; Take 1 capsule (40 mg total) by mouth daily.  Dispense: 90 capsule; Refill: 1  12. Encounter for screening mammogram for malignant neoplasm of breast  - MM 3D SCREEN BREAST BILATERAL; Future  13. Colon cancer screening  - Ambulatory referral to Gastroenterology  14. Chronic left-sided low back pain without sciatica  - gabapentin (NEURONTIN) 100 MG capsule; Take 1 capsule (100 mg total) by mouth at bedtime.  Dispense: 90 capsule; Refill: 1  15. Perennial allergic rhinitis  - fluticasone (FLONASE) 50 MCG/ACT nasal spray; Place 2 sprays into both nostrils daily.  Dispense: 16 g; Refill: 2  16. Centrilobular emphysema ([place])  - Fluticasone-Umeclidin-Vilant (TRELEGY ELLIPTA) 100-62.5-25 MCG/INH AEPB; Inhale 1 puff into the lungs daily.  Dispense: 60 each; Refill: 5  17. COPD with acute exacerbation (HCC)  - levofloxacin (LEVAQUIN) 500 MG tablet; Take 1 tablet (500 mg total) by mouth daily.  Dispense: 7 tablet; Refill: 0 We will also add 5 days of prednisone   -USPSTF grade A and B recommendations reviewed with patient; age-appropriate recommendations, preventive care, screening tests, etc discussed and encouraged; healthy living encouraged; see AVS for patient education given to patient -Discussed importance of 150 minutes of physical activity weekly, eat two servings of fish weekly, eat one serving of tree nuts ( cashews, pistachios, pecans, almonds.Marland Kitchen) every other day, eat 6 servings of fruit/vegetables daily and drink plenty of water and avoid sweet beverages.

## 2019-07-20 NOTE — Telephone Encounter (Signed)
Patient has been notified that annual lung cancer screening low dose CT scan is due currently or will be in near future. Confirmed that patient is within the age range of 55-77, and asymptomatic, (no signs or symptoms of lung cancer). Patient denies illness that would prevent curative treatment for lung cancer if found. Verified smoking history, (current, 38 pack year). The shared decision making visit was done 11/10/17. Patient is agreeable for CT scan being scheduled.

## 2019-07-20 NOTE — Patient Instructions (Addendum)
Nichole Wells - CT lung cancer screen    Preventive Care 48-57 Years Old, Female Preventive care refers to visits with your health care provider and lifestyle choices that can promote health and wellness. This includes:  A yearly physical exam. This may also be called an annual well check.  Regular dental visits and eye exams.  Immunizations.  Screening for certain conditions.  Healthy lifestyle choices, such as eating a healthy diet, getting regular exercise, not using drugs or products that contain nicotine and tobacco, and limiting alcohol use. What can I expect for my preventive care visit? Physical exam Your health care provider will check your:  Height and weight. This may be used to calculate body mass index (BMI), which tells if you are at a healthy weight.  Heart rate and blood pressure.  Skin for abnormal spots. Counseling Your health care provider may ask you questions about your:  Alcohol, tobacco, and drug use.  Emotional well-being.  Home and relationship well-being.  Sexual activity.  Eating habits.  Work and work Statistician.  Method of birth control.  Menstrual cycle.  Pregnancy history. What immunizations do I need?  Influenza (flu) vaccine  This is recommended every year. Tetanus, diphtheria, and pertussis (Tdap) vaccine  You may need a Td booster every 10 years. Varicella (chickenpox) vaccine  You may need this if you have not been vaccinated. Zoster (shingles) vaccine  You may need this after age 20. Measles, mumps, and rubella (MMR) vaccine  You may need at least one dose of MMR if you were born in 1957 or later. You may also need a second dose. Pneumococcal conjugate (PCV13) vaccine  You may need this if you have certain conditions and were not previously vaccinated. Pneumococcal polysaccharide (PPSV23) vaccine  You may need one or two doses if you smoke cigarettes or if you have certain conditions. Meningococcal  conjugate (MenACWY) vaccine  You may need this if you have certain conditions. Hepatitis A vaccine  You may need this if you have certain conditions or if you travel or work in places where you may be exposed to hepatitis A. Hepatitis B vaccine  You may need this if you have certain conditions or if you travel or work in places where you may be exposed to hepatitis B. Haemophilus influenzae type b (Hib) vaccine  You may need this if you have certain conditions. Human papillomavirus (HPV) vaccine  If recommended by your health care provider, you may need three doses over 6 months. You may receive vaccines as individual doses or as more than one vaccine together in one shot (combination vaccines). Talk with your health care provider about the risks and benefits of combination vaccines. What tests do I need? Blood tests  Lipid and cholesterol levels. These may be checked every 5 years, or more frequently if you are over 97 years old.  Hepatitis C test.  Hepatitis B test. Screening  Lung cancer screening. You may have this screening every year starting at age 74 if you have a 30-pack-year history of smoking and currently smoke or have quit within the past 15 years.  Colorectal cancer screening. All adults should have this screening starting at age 79 and continuing until age 25. Your health care provider may recommend screening at age 34 if you are at increased risk. You will have tests every 1-10 years, depending on your results and the type of screening test.  Diabetes screening. This is done by checking your blood sugar (glucose) after  you have not eaten for a while (fasting). You may have this done every 1-3 years.  Mammogram. This may be done every 1-2 years. Talk with your health care provider about when you should start having regular mammograms. This may depend on whether you have a family history of breast cancer.  BRCA-related cancer screening. This may be done if you have a  family history of breast, ovarian, tubal, or peritoneal cancers.  Pelvic exam and Pap test. This may be done every 3 years starting at age 73. Starting at age 44, this may be done every 5 years if you have a Pap test in combination with an HPV test. Other tests  Sexually transmitted disease (STD) testing.  Bone density scan. This is done to screen for osteoporosis. You may have this scan if you are at high risk for osteoporosis. Follow these instructions at home: Eating and drinking  Eat a diet that includes fresh fruits and vegetables, whole grains, lean protein, and low-fat dairy.  Take vitamin and mineral supplements as recommended by your health care provider.  Do not drink alcohol if: ? Your health care provider tells you not to drink. ? You are pregnant, may be pregnant, or are planning to become pregnant.  If you drink alcohol: ? Limit how much you have to 0-1 drink a day. ? Be aware of how much alcohol is in your drink. In the [place] one drink equals one 12 oz bottle of beer (355 mL), one 5 oz glass of wine (148 mL), or one 1 oz glass of hard liquor (44 mL). Lifestyle  Take daily care of your teeth and gums.  Stay active. Exercise for at least 30 minutes on 5 or more days each week.  Do not use any products that contain nicotine or tobacco, such as cigarettes, e-cigarettes, and chewing tobacco. If you need help quitting, ask your health care provider.  If you are sexually active, practice safe sex. Use a condom or other form of birth control (contraception) in order to prevent pregnancy and STIs (sexually transmitted infections).  If told by your health care provider, take low-dose aspirin daily starting at age 70. What's next?  Visit your health care provider once a year for a well check visit.  Ask your health care provider how often you should have your eyes and teeth checked.  Stay up to date on all vaccines. This information is not intended to replace advice given  to you by your health care provider. Make sure you discuss any questions you have with your health care provider. Document Revised: 10/08/2017 Document Reviewed: 10/08/2017 Elsevier Patient Education  2020 [employer].

## 2019-07-21 LAB — LIPID PANEL
Cholesterol: 138 mg/dL
HDL: 53 mg/dL
LDL Cholesterol (Calc): 65 mg/dL
Non-HDL Cholesterol (Calc): 85 mg/dL
Total CHOL/HDL Ratio: 2.6 (calc)
Triglycerides: 112 mg/dL

## 2019-07-21 LAB — HEMOGLOBIN A1C
Hgb A1c MFr Bld: 5.4 % of total Hgb (ref <5.7)
Mean Plasma Glucose: 108 (calc)
eAG (mmol/L): 6 (calc)

## 2019-07-21 LAB — VITAMIN D 25 HYDROXY (VIT D DEFICIENCY, FRACTURES): Vit D, 25-Hydroxy: 53 ng/mL (ref 30–100)

## 2019-07-21 LAB — COMPLETE METABOLIC PANEL WITHOUT GFR
AG Ratio: 2 (calc) (ref 1.0–2.5)
ALT: 26 U/L (ref 6–29)
AST: 20 U/L (ref 10–35)
Albumin: 4.5 g/dL (ref 3.6–5.1)
Alkaline phosphatase (APISO): 108 U/L (ref 37–153)
BUN: 17 mg/dL (ref 7–25)
CO2: 27 mmol/L (ref 20–32)
Calcium: 10.3 mg/dL (ref 8.6–10.4)
Chloride: 106 mmol/L (ref 98–110)
Creat: 0.56 mg/dL (ref 0.50–1.05)
GFR, Est African American: 121 mL/min/1.73m2
GFR, Est Non African American: 104 mL/min/1.73m2
Globulin: 2.3 g/dL (ref 1.9–3.7)
Glucose, Bld: 102 mg/dL — ABNORMAL HIGH (ref 65–99)
Potassium: 4.6 mmol/L (ref 3.5–5.3)
Sodium: 138 mmol/L (ref 135–146)
Total Bilirubin: 0.3 mg/dL (ref 0.2–1.2)
Total Protein: 6.8 g/dL (ref 6.1–8.1)

## 2019-07-21 LAB — CBC WITH DIFFERENTIAL/PLATELET
Absolute Monocytes: 713 cells/uL (ref 200–950)
Basophils Absolute: 61 cells/uL (ref 0–200)
Basophils Relative: 0.7 %
Eosinophils Absolute: 287 cells/uL (ref 15–500)
Eosinophils Relative: 3.3 %
HCT: 45.7 % — ABNORMAL HIGH (ref 35.0–45.0)
Hemoglobin: 14.9 g/dL (ref 11.7–15.5)
Lymphs Abs: 2140 cells/uL (ref 850–3900)
MCH: 29.9 pg (ref 27.0–33.0)
MCHC: 32.6 g/dL (ref 32.0–36.0)
MCV: 91.8 fL (ref 80.0–100.0)
MPV: 9.7 fL (ref 7.5–12.5)
Monocytes Relative: 8.2 %
Neutro Abs: 5498 cells/uL (ref 1500–7800)
Neutrophils Relative %: 63.2 %
Platelets: 354 10*3/uL (ref 140–400)
RBC: 4.98 10*6/uL (ref 3.80–5.10)
RDW: 12.8 % (ref 11.0–15.0)
Total Lymphocyte: 24.6 %
WBC: 8.7 10*3/uL (ref 3.8–10.8)

## 2019-07-21 LAB — C-REACTIVE PROTEIN: CRP: 3.1 mg/L (ref <8.0)

## 2019-07-21 LAB — SEDIMENTATION RATE: Sed Rate: 9 mm/h (ref 0–30)

## 2019-07-22 ENCOUNTER — Ambulatory Visit
Admission: RE | Admit: 2019-07-22 | Discharge: 2019-07-22 | Disposition: A | Discharge status: Home / Self Care | Payer: No Typology Code available for payment source | Source: Ambulatory Visit | Attending: Family Medicine | Admitting: Family Medicine

## 2019-07-22 DIAGNOSIS — Z1231 Encounter for screening mammogram for malignant neoplasm of breast: Secondary | ICD-10-CM | POA: Insufficient documentation

## 2019-07-27 ENCOUNTER — Emergency Department: Payer: No Typology Code available for payment source

## 2019-07-27 ENCOUNTER — Emergency Department
Admission: EM | Admit: 2019-07-27 | Discharge: 2019-07-27 | Disposition: A | Discharge status: Home / Self Care | Payer: No Typology Code available for payment source | Attending: Student | Admitting: Student

## 2019-07-27 ENCOUNTER — Encounter: Payer: Self-pay | Admitting: Emergency Medicine

## 2019-07-27 ENCOUNTER — Other Ambulatory Visit: Payer: Self-pay

## 2019-07-27 DIAGNOSIS — I10 Essential (primary) hypertension: Secondary | ICD-10-CM | POA: Diagnosis not present

## 2019-07-27 DIAGNOSIS — Z7982 Long term (current) use of aspirin: Secondary | ICD-10-CM | POA: Insufficient documentation

## 2019-07-27 DIAGNOSIS — Z79899 Other long term (current) drug therapy: Secondary | ICD-10-CM | POA: Insufficient documentation

## 2019-07-27 DIAGNOSIS — R071 Chest pain on breathing: Secondary | ICD-10-CM | POA: Insufficient documentation

## 2019-07-27 DIAGNOSIS — R109 Unspecified abdominal pain: Secondary | ICD-10-CM | POA: Insufficient documentation

## 2019-07-27 DIAGNOSIS — M546 Pain in thoracic spine: Secondary | ICD-10-CM | POA: Diagnosis present

## 2019-07-27 DIAGNOSIS — F17210 Nicotine dependence, cigarettes, uncomplicated: Secondary | ICD-10-CM | POA: Diagnosis not present

## 2019-07-27 LAB — COMPREHENSIVE METABOLIC PANEL
ALT: 29 U/L (ref 0–44)
AST: 22 U/L (ref 15–41)
Albumin: 4 g/dL (ref 3.5–5.0)
Alkaline Phosphatase: 95 U/L (ref 38–126)
Anion gap: 8 (ref 5–15)
BUN: 19 mg/dL (ref 6–20)
CO2: 27 mmol/L (ref 22–32)
Calcium: 10 mg/dL (ref 8.9–10.3)
Chloride: 102 mmol/L (ref 98–111)
Creatinine, Ser: 0.74 mg/dL (ref 0.44–1.00)
GFR calc Af Amer: >60 mL/min (ref >60)
GFR calc non Af Amer: >60 mL/min (ref >60)
Glucose, Bld: 93 mg/dL (ref 70–99)
Potassium: 4.1 mmol/L (ref 3.5–5.1)
Sodium: 137 mmol/L (ref 135–145)
Total Bilirubin: 0.6 mg/dL (ref 0.3–1.2)
Total Protein: 6.9 g/dL (ref 6.5–8.1)

## 2019-07-27 LAB — CBC WITH DIFFERENTIAL/PLATELET
Abs Immature Granulocytes: 0.1 10*3/uL — ABNORMAL HIGH (ref 0.00–0.07)
Basophils Absolute: 0.1 10*3/uL (ref 0.0–0.1)
Basophils Relative: 0 %
Eosinophils Absolute: 0.2 10*3/uL (ref 0.0–0.5)
Eosinophils Relative: 1 %
HCT: 40.4 % (ref 36.0–46.0)
Hemoglobin: 14.2 g/dL (ref 12.0–15.0)
Immature Granulocytes: 1 %
Lymphocytes Relative: 27 %
Lymphs Abs: 4.6 10*3/uL — ABNORMAL HIGH (ref 0.7–4.0)
MCH: 30.5 pg (ref 26.0–34.0)
MCHC: 35.1 g/dL (ref 30.0–36.0)
MCV: 86.7 fL (ref 80.0–100.0)
Monocytes Absolute: 1.5 10*3/uL — ABNORMAL HIGH (ref 0.1–1.0)
Monocytes Relative: 9 %
Neutro Abs: 10.9 10*3/uL — ABNORMAL HIGH (ref 1.7–7.7)
Neutrophils Relative %: 62 %
Platelets: 365 10*3/uL (ref 150–400)
RBC: 4.66 MIL/uL (ref 3.87–5.11)
RDW: 14 % (ref 11.5–15.5)
WBC: 17.4 10*3/uL — ABNORMAL HIGH (ref 4.0–10.5)
nRBC: 0 % (ref 0.0–0.2)

## 2019-07-27 LAB — TROPONIN I (HIGH SENSITIVITY)
Troponin I (High Sensitivity): 4 ng/L (ref <18)
Troponin I (High Sensitivity): 5 ng/L (ref <18)

## 2019-07-27 MED ORDER — MORPHINE SULFATE (PF) 4 MG/ML IV SOLN
4.0000 mg | Freq: Once | INTRAVENOUS | Status: AC
  Administered 2019-07-27: 4 mg via INTRAVENOUS
  Filled 2019-07-27: qty 1

## 2019-07-27 MED ORDER — IOHEXOL 350 MG/ML SOLN
100.0000 mL | Freq: Once | INTRAVENOUS | Status: AC | PRN
  Administered 2019-07-27: 100 mL via INTRAVENOUS

## 2019-07-27 MED ORDER — NAPROXEN 500 MG PO TBEC
500.0000 mg | DELAYED_RELEASE_TABLET | Freq: Two times a day (BID) | ORAL | 0 refills | Status: AC
Start: 2019-07-27 — End: 2019-08-06

## 2019-07-27 MED ORDER — ONDANSETRON HCL 4 MG/2ML IJ SOLN
4.0000 mg | Freq: Once | INTRAMUSCULAR | Status: AC
  Administered 2019-07-27: 4 mg via INTRAVENOUS
  Filled 2019-07-27: qty 2

## 2019-07-27 MED ORDER — KETOROLAC TROMETHAMINE 30 MG/ML IJ SOLN
15.0000 mg | Freq: Once | INTRAMUSCULAR | Status: AC
  Administered 2019-07-27: 15 mg via INTRAVENOUS
  Filled 2019-07-27: qty 1

## 2019-07-27 NOTE — ED Notes (Signed)
NAD noted at time of D/C. Pt denies questions or concerns. Pt ambulatory to the lobby at this time. Driving precautions reviewed with patient regarding receiving narcotics. Pt states understanding.

## 2019-07-27 NOTE — ED Notes (Signed)
Pt taken to CT at this time.

## 2019-07-27 NOTE — ED Provider Notes (Signed)
[facility] Emergency Department Provider Note  ____________________________________________   First MD Initiated Contact with Patient 07/27/19 [phone]     (approximate)  I have reviewed the triage vital signs and the nursing notes.  History  Chief Complaint Back Pain    HPI Nichole Wells is a 59 y.o. female past medical history as below, who presents to the emergency department for right upper/mid back pain.  Patient states the pain woke her from sleep early this morning, has been constant since onset.  Located to the right sided, mid thoracic area.  Sharp.  Moderate in severity.  Worsened with deep inspiration and movement.  Denies any anterior chest pain or frank shortness of breath.  Denies any changes in her activity level or heavy lifting.  No weakness, numbness, tingling.  Does report a recent sinus infection, completing a course of oral antibiotics and prednisone today.  No history of VTE.  No direct trauma.   Past Medical Hx Past Medical History:  Diagnosis Date  . Anxiety   . Arthritis    poss ra  . GERD (gastroesophageal reflux disease)    occ  . Heart murmur   . Hypertension   . Inflammatory arthritis   . Vitamin D deficiency     Problem List Patient Active Problem List   Diagnosis Date Noted  . Recurrent major depressive disorder, in remission ([place]) 02/18/2018  . Thoracic aorta atherosclerosis ([place]) 11/10/2017  . Centrilobular emphysema ([place]) 11/10/2017  . Abnormal liver CT 11/10/2017  . Inflammatory arthritis 05/07/2017  . Current tobacco use 05/07/2017  . Vitamin D deficiency 05/28/2016  . Surgical menopause on hormone replacement therapy 04/23/2016  . Status post laparoscopic assisted vaginal hysterectomy (LAVH) 01/28/2016  . Family history of ovarian cancer 11/13/2015  . Insomnia 03/27/2015  . Hypertension 08/30/2014  . Anxiety and depression 08/30/2014  . Cardiac murmur 08/30/2014  . HLD (hyperlipidemia) 08/30/2014  . DDD  (degenerative disc disease), lumbar 07/26/2014    Past Surgical Hx Past Surgical History:  Procedure Laterality Date  . ABDOMINAL HYSTERECTOMY    . BREAST BIOPSY Left    stereo- neg  . BREAST CYST ASPIRATION Right    neg  . BREAST SURGERY     benign biopsy  . CHOLECYSTECTOMY    . DILATION AND CURETTAGE OF UTERUS    . LAPAROSCOPIC LYSIS OF ADHESIONS  12/10/2015   Procedure: LAPAROSCOPIC LYSIS OF ADHESIONS;  Surgeon: Brayton Mars, MD;  Location: [facility] ORS;  Service: Gynecology;;  . LAPAROSCOPIC VAGINAL HYSTERECTOMY WITH SALPINGO OOPHORECTOMY Bilateral 01/28/2016   Procedure: LAPAROSCOPIC ASSISTED VAGINAL HYSTERECTOMY WITH SALPINGO OOPHORECTOMY;  Surgeon: Brayton Mars, MD;  Location: [facility] ORS;  Service: Gynecology;  Laterality: Bilateral;  . LAPAROSCOPY N/A 12/10/2015   Procedure: LAPAROSCOPY DIAGNOSTIC WITH BIOPSIES;  Surgeon: Brayton Mars, MD;  Location: [facility] ORS;  Service: Gynecology;  Laterality: N/A;  . SHOULDER ARTHROSCOPY Right   . TUBAL LIGATION    . UTERINE FIBROID EMBOLIZATION      Medications Prior to Admission medications   Medication Sig Start Date End Date Taking? Authorizing Provider  amLODipine (NORVASC) 10 MG tablet Take 1 tablet (10 mg total) by mouth daily. 07/20/19   Steele Sizer, MD  aspirin 81 MG EC tablet TAKE 1 TABLET BY MOUTH EVERY DAY 01/23/19   Steele Sizer, MD  Cholecalciferol (VITAMIN D) 50 MCG (2000 UT) CAPS Take 1 capsule (2,000 Units total) by mouth daily. 01/18/19   Steele Sizer, MD  FLUoxetine (PROZAC) 40 MG capsule Take  1 capsule (40 mg total) by mouth daily. 07/20/19   Steele Sizer, MD  fluticasone (FLONASE) 50 MCG/ACT nasal spray Place 2 sprays into both nostrils daily. 07/20/19   Steele Sizer, MD  Fluticasone-Umeclidin-Vilant (TRELEGY ELLIPTA) 100-62.5-25 MCG/INH AEPB Inhale 1 puff into the lungs daily. 07/20/19   Steele Sizer, MD  gabapentin (NEURONTIN) 100 MG capsule Take 1 capsule (100 mg total) by mouth at  bedtime. 07/20/19   Steele Sizer, MD  hydrochlorothiazide (HYDRODIURIL) 12.5 MG tablet Take 1 tablet (12.5 mg total) by mouth daily. 07/20/19   Steele Sizer, MD  levofloxacin (LEVAQUIN) 500 MG tablet Take 1 tablet (500 mg total) by mouth daily. 07/20/19   Steele Sizer, MD  metaxalone (SKELAXIN) 800 MG tablet Take 1 tablet (800 mg total) by mouth 3 (three) times daily. 01/18/19   Steele Sizer, MD  metoprolol succinate (TOPROL-XL) 50 MG 24 hr tablet Take 1 tablet (50 mg total) by mouth daily. Take with or immediately following a meal. 07/20/19   Sowles, Drue Stager, MD  predniSONE (DELTASONE) 20 MG tablet Take 1 tablet (20 mg total) by mouth 2 (two) times daily with a meal. 07/20/19   Ancil Boozer, Drue Stager, MD  rosuvastatin (CRESTOR) 40 MG tablet Take 1 tablet (40 mg total) by mouth daily. 07/20/19   Steele Sizer, MD    Allergies Hydroxychloroquine and Penicillin g  Family Hx Family History  Problem Relation Age of Onset  . Cancer Mother        cervical, lung  . Lung cancer Mother   . Heart disease Father   . Hypertension Father   . COPD Father   . Breast cancer Maternal Aunt   . Ovarian cancer Maternal Aunt   . Spina bifida Sister   . Diabetes Son   . Diabetes Maternal Grandmother   . Heart disease Maternal Grandmother   . Parkinson's disease Paternal Grandfather     Social Hx Social History   Tobacco Use  . Smoking status: Current Every Day Smoker    Packs/day: 1.00    Years: 37.00    Pack years: 37.00    Types: Cigarettes    Start date: 10/06/1987  . Smokeless tobacco: Never Used  Vaping Use  . Vaping Use: Never used  Substance Use Topics  . Alcohol use: Yes    Alcohol/week: 0.0 standard drinks    Comment: Occasional  . Drug use: No     Review of Systems  Constitutional: Negative for fever. Negative for chills. Eyes: Negative for visual changes. ENT: Negative for sore throat. Cardiovascular: Negative for chest pain. Respiratory: Negative for shortness of  breath. Gastrointestinal: Negative for nausea. Negative for vomiting.  Genitourinary: Negative for dysuria. Musculoskeletal: + R mid thoracic back pain Skin: Negative for rash. Neurological: Negative for headaches.   Physical Exam  Vital Signs: ED Triage Vitals  Enc Vitals Group     BP 07/27/19 0628 (!) 155/73     Pulse Rate 07/27/19 0628 78     Resp 07/27/19 0628 18     Temp 07/27/19 0628 98.5 F (36.9 C)     Temp Source 07/27/19 0628 Oral     SpO2 07/27/19 0628 97 %     Weight 07/27/19 0629 179 lb (81.2 kg)     Height 07/27/19 0629 5\' 1"  (1.549 m)     Head Circumference --      Peak Flow --      Pain Score 07/27/19 0629 10     Pain Loc --  Pain Edu? --      Excl. in [place]? --     Constitutional: Alert and oriented. Well appearing. NAD.  Head: Normocephalic. Atraumatic. Eyes: Conjunctivae clear. Sclera anicteric. Pupils equal and symmetric. Nose: No masses or lesions. No congestion or rhinorrhea. Mouth/Throat: Wearing mask.  Neck: No stridor. Trachea midline.  Cardiovascular: Normal rate, regular rhythm. Extremities well perfused. Respiratory: Normal respiratory effort.  Lungs CTAB. Gastrointestinal: Soft. Non-distended. Non-tender.  Genitourinary: Deferred. Musculoskeletal: No lower extremity edema. No deformities. Back: On visual expection there are no gross abnormalities.  No rashes, lesions, ecchymosis.  No swelling.  No midline C/T-spine tenderness.  Right, paraspinal midthoracic area is where she cites her deep pain, but is nontender on exam.   Neurologic:  Normal speech and language. No gross focal or lateralizing neurologic deficits are appreciated.  Skin: Skin is warm, dry and intact. No rash noted.  No rashes, vesicles, lesions. Psychiatric: Mood and affect are appropriate for situation.  EKG  Personally reviewed and interpreted by myself.   Date: 07/27/19 Time: 0630 Rate: 72 Rhythm: sinus Axis: normal Intervals: WNL No acute ischemic changes No  acute arrhythmias No STEMI    Radiology  Personally reviewed available imaging myself.   CT dissection - IMPRESSION:  No aortic abnormality is noted.  No evidence of pulmonary emboli.  Patchy ground-glass opacities in the anterior aspect of the left  upper lobe medially. These are likely postinflammatory in nature.  Short-term follow-up in 3-6 months is recommended to assess for  stability/resolution.  Fatty liver.  Diverticulosis without diverticulitis.    Procedures  Procedure(s) performed (including critical care):  Procedures   Initial Impression / Assessment and Plan / MDM / ED Course  59 y.o. female who presents to the ED for right mid thoracic back pain, as described above  Ddx: MSK, dissection, PE, pleurisy, pulmonary infection  Will plan for labs, imaging, pain control and reassess  EKG without acute ischemic changes or arrhythmias.  Initial troponin negative.  CBC notable for leukocytosis, however this is in the setting of her prednisone use.  CT negative for any acute aortic abnormalities, no PE.  Patchy groundglass opacities in the left upper lobe, likely postinflammatory.  Repeat troponin negative.  As such, given otherwise negative work-up, feel patient is stable for discharge.  Will provide Rx for naproxen for symptom control.  Advised PCP follow-up and given return precautions.  Patient and husband at bedside agreeable with the plan.   _______________________________   As part of my medical decision making I have reviewed available labs, radiology tests, reviewed old records/performed chart review, obtained additional history from husband at bedside.    Final Clinical Impression(s) / ED Diagnosis  Right-sided thoracic back pain Leukocytosis   Note:  This document was prepared using Dragon voice recognition software and may include unintentional dictation errors.   Lilia Pro., MD 07/27/19 1144

## 2019-07-27 NOTE — ED Notes (Signed)
Pt presents to ED via POV with c/o sudden onset R upper back pain that's worse with deep inspiration and movement. Pt denies known injury at this time. Pt A&O x4, NAD noted at this time.   Pt states pain 10/10 at this time.

## 2019-07-27 NOTE — ED Triage Notes (Signed)
Patient ambulatory to triage with steady gait, without difficulty or distress noted, mask in place; pt reports rt upper back pain that increases with deep breathing; st recent sinus infection with prod cough yellow sputum

## 2019-07-27 NOTE — ED Notes (Signed)
EDP at bedside at this time.  

## 2019-07-27 NOTE — Discharge Instructions (Addendum)
Thank you for letting us take care of you in the emergency department today.   Please continue to take any regular, prescribed medications.   New medications we have prescribed:  Naproxen, for pain and inflammation. Take w/ food as it can cause stomach irritation.   Please follow up with: Your primary care doctor to review your ER visit and follow up on your symptoms.    Please return to the ER for any new or worsening symptoms.

## 2019-07-28 ENCOUNTER — Ambulatory Visit: Admission: RE | Admit: 2019-07-28 | Payer: No Typology Code available for payment source | Source: Ambulatory Visit

## 2019-08-08 ENCOUNTER — Other Ambulatory Visit: Payer: Self-pay | Admitting: Oncology

## 2019-08-08 DIAGNOSIS — R911 Solitary pulmonary nodule: Secondary | ICD-10-CM

## 2019-08-08 NOTE — Progress Notes (Signed)
  Pulmonary Nodule Clinic Telephone Note [place]   Received referral from Dr. Ancil Boozer.   HPI: Mrs. Gills is a 59 year old female with past medical history significant for hypertension, aortic sclerosis, emphysema, degenerative disc disease, arthritis, anxiety and depression, hyperlipidemia, insomnia, vitamin D deficiency who was recently evaluated in [facility] emergency room for back pain.  Work-up included CT angio to rule out dissection and found to have patchy groundglass opacities in the aspect of the left upper lobe medially likely postinflammatory in nature but short-term follow-up was recommended.  Review and Recommendations: I personally reviewed all patient's previous imaging including most recent CTA from 07/27/2019.  I recommend follow-up with noncontrast chest CT in approximately 4 months from previous.  Social History: Patient is a current everyday smoker.   Social History   Tobacco Use  Smoking Status Current Every Day Smoker  . Packs/day: 1.00  . Years: 37.00  . Pack years: 37.00  . Types: Cigarettes  . Start date: 10/06/1987  Smokeless Tobacco Never Used    High risk factors include: History of heavy smoking, exposure to asbestos, radium or uranium, personal family history of lung cancer, older age, sex (females greater than males), race (black and native [place] greater than weight), marginal speculation, upper lobe location, multiplicity (less than 5 nodules increases risk for malignancy) and emphysema and/or pulmonary fibrosis.   This recommendation follows the consensus statement: Guidelines for Management of Incidental Pulmonary Nodules Detected on CT Images: From the Fleischner Society 2017; Radiology 2017; 284:228-243.    I have placed order for CT scan without contrast to be completed approximately 4 months from previous.  Disposition: Order placed for repeat CT chest. Will notify Lenox Ponds in scheduling. Arthur to call patient with  appointment date and time. Return to pulmonary nodule clinic a few days after his repeat imaging to discuss results and plan moving forward.  Faythe Casa, NP 08/08/2019 11:01 AM

## 2019-08-09 ENCOUNTER — Encounter: Payer: Self-pay | Admitting: *Deleted

## 2019-10-05 ENCOUNTER — Ambulatory Visit (INDEPENDENT_AMBULATORY_CARE_PROVIDER_SITE_OTHER): Payer: No Typology Code available for payment source | Admitting: Gastroenterology

## 2019-10-05 ENCOUNTER — Other Ambulatory Visit: Payer: Self-pay

## 2019-10-05 VITALS — BP 111/69 | HR 67 | Temp 97.9°F | Ht 62.0 in | Wt 180.0 lb

## 2019-10-05 DIAGNOSIS — Z1211 Encounter for screening for malignant neoplasm of colon: Secondary | ICD-10-CM

## 2019-10-05 DIAGNOSIS — K760 Fatty (change of) liver, not elsewhere classified: Secondary | ICD-10-CM | POA: Diagnosis not present

## 2019-10-05 MED ORDER — PEG 3350-KCL-NABCB-NACL-NASULF 236 G PO SOLR: ORAL | 0 refills | Status: DC

## 2019-10-05 NOTE — Patient Instructions (Signed)
Fatty Liver Disease  Fatty liver disease occurs when too much fat has built up in your liver cells. Fatty liver disease is also called hepatic steatosis or steatohepatitis. The liver removes harmful substances from your bloodstream and produces fluids that your body needs. It also helps your body use and store energy from the food you eat. In many cases, fatty liver disease does not cause symptoms or problems. It is often diagnosed when tests are being done for other reasons. However, over time, fatty liver can cause inflammation that may lead to more serious liver problems, such as scarring of the liver (cirrhosis) and liver failure. Fatty liver is associated with insulin resistance, increased body fat, high blood pressure (hypertension), and high cholesterol. These are features of metabolic syndrome and increase your risk for stroke, diabetes, and heart disease. What are the causes? This condition may be caused by:  Drinking too much alcohol.  Poor nutrition.  Obesity.  Cushing's syndrome.  Diabetes.  High cholesterol.  Certain drugs.  Poisons.  Some viral infections.  Pregnancy. What increases the risk? You are more likely to develop this condition if you:  Abuse alcohol.  Are overweight.  Have diabetes.  Have hepatitis.  Have a high triglyceride level.  Are pregnant. What are the signs or symptoms? Fatty liver disease often does not cause symptoms. If symptoms do develop, they can include:  Fatigue.  Weakness.  Weight loss.  Confusion.  Abdominal pain.  Nausea and vomiting.  Yellowing of your skin and the white parts of your eyes (jaundice).  Itchy skin. How is this diagnosed? This condition may be diagnosed by:  A physical exam and medical history.  Blood tests.  Imaging tests, such as an ultrasound, CT scan, or MRI.  A liver biopsy. A small sample of liver tissue is removed using a needle. The sample is then looked at under a microscope. How  is this treated? Fatty liver disease is often caused by other health conditions. Treatment for fatty liver may involve medicines and lifestyle changes to manage conditions such as:  Alcoholism.  High cholesterol.  Diabetes.  Being overweight or obese. Follow these instructions at home:   Do not drink alcohol. If you have trouble quitting, ask your health care provider how to safely quit with the help of medicine or a supervised program. This is important to keep your condition from getting worse.  Eat a healthy diet as told by your health care provider. Ask your health care provider about working with a diet and nutrition specialist (dietitian) to develop an eating plan.  Exercise regularly. This can help you lose weight and control your cholesterol and diabetes. Talk to your health care provider about an exercise plan and which activities are best for you.  Take over-the-counter and prescription medicines only as told by your health care provider.  Keep all follow-up visits as told by your health care provider. This is important. Contact a health care provider if: You have trouble controlling your:  Blood sugar. This is especially important if you have diabetes.  Cholesterol.  Drinking of alcohol. Get help right away if:  You have abdominal pain.  You have jaundice.  You have nausea and vomiting.  You vomit blood or material that looks like coffee grounds.  You have stools that are black, tar-like, or bloody. Summary  Fatty liver disease develops when too much fat builds up in the cells of your liver.  Fatty liver disease often causes no symptoms or problems. However, over   time, fatty liver can cause inflammation that may lead to more serious liver problems, such as scarring of the liver (cirrhosis).  You are more likely to develop this condition if you abuse alcohol, are pregnant, are overweight, have diabetes, have hepatitis, or have high triglyceride  levels.  Contact your health care provider if you have trouble controlling your weight, blood sugar, cholesterol, or drinking of alcohol. This information is not intended to replace advice given to you by your health care provider. Make sure you discuss any questions you have with your health care provider. Document Revised: 01/09/2017 Document Reviewed: 11/05/2016 Elsevier Patient Education  2020 [employer]  

## 2019-10-05 NOTE — Progress Notes (Signed)
[name] MD, MRCP(U.K) [address]  [place]  [place], [place] [address]  Main: [phone]  Fax: [phone]   Gastroenterology Consultation  Referring Provider:     Steele Sizer, MD Primary Care Physician:  Steele Sizer, MD Primary Gastroenterologist:  Dr. [name]  Reason for Consultation:   Fatty liver and colon cancer screening        HPI:   Nichole Wells is a 59 y.o. y/o female referred for consultation & management  by Dr. Ancil Boozer, Drue Stager, MD.   Recent CT angio of the chest abdomen pelvis performed to rule out dissection demonstrated fatty liver.  In addition groundglass opacities in the left upper lobe were noted.  Diverticulosis of the colon was noted.  Recent CMP in June 2021 demonstrated normal liver transaminases.  Hepatitis C antibody in 2018 was negative.  Denies any previous colonoscopy.  Her father had colon polyps and was obtaining a colonoscopy every 5 years. Denies any lower GI symptoms such as change in bowel habits or rectal bleeding.  Recollects she has had a Cologuard in the past which was negative. Past Medical History:  Diagnosis Date  . Anxiety   . Arthritis    poss ra  . GERD (gastroesophageal reflux disease)    occ  . Heart murmur   . Hypertension   . Inflammatory arthritis   . Vitamin D deficiency     Past Surgical History:  Procedure Laterality Date  . ABDOMINAL HYSTERECTOMY    . BREAST BIOPSY Left    stereo- neg  . BREAST CYST ASPIRATION Right    neg  . BREAST SURGERY     benign biopsy  . CHOLECYSTECTOMY    . DILATION AND CURETTAGE OF UTERUS    . LAPAROSCOPIC LYSIS OF ADHESIONS  12/10/2015   Procedure: LAPAROSCOPIC LYSIS OF ADHESIONS;  Surgeon: Brayton Mars, MD;  Location: [facility] ORS;  Service: Gynecology;;  . LAPAROSCOPIC VAGINAL HYSTERECTOMY WITH SALPINGO OOPHORECTOMY Bilateral 01/28/2016   Procedure: LAPAROSCOPIC ASSISTED VAGINAL HYSTERECTOMY WITH SALPINGO OOPHORECTOMY;  Surgeon: Brayton Mars, MD;   Location: [facility] ORS;  Service: Gynecology;  Laterality: Bilateral;  . LAPAROSCOPY N/A 12/10/2015   Procedure: LAPAROSCOPY DIAGNOSTIC WITH BIOPSIES;  Surgeon: Brayton Mars, MD;  Location: [facility] ORS;  Service: Gynecology;  Laterality: N/A;  . SHOULDER ARTHROSCOPY Right   . TUBAL LIGATION    . UTERINE FIBROID EMBOLIZATION      Prior to Admission medications   Medication Sig Start Date End Date Taking? Authorizing Provider  amLODipine (NORVASC) 10 MG tablet Take 1 tablet (10 mg total) by mouth daily. 07/20/19   Steele Sizer, MD  aspirin 81 MG EC tablet TAKE 1 TABLET BY MOUTH EVERY DAY 01/23/19   Steele Sizer, MD  Cholecalciferol (VITAMIN D) 50 MCG (2000 UT) CAPS Take 1 capsule (2,000 Units total) by mouth daily. 01/18/19   Steele Sizer, MD  FLUoxetine (PROZAC) 40 MG capsule Take 1 capsule (40 mg total) by mouth daily. 07/20/19   Steele Sizer, MD  fluticasone (FLONASE) 50 MCG/ACT nasal spray Place 2 sprays into both nostrils daily. 07/20/19   Steele Sizer, MD  Fluticasone-Umeclidin-Vilant (TRELEGY ELLIPTA) 100-62.5-25 MCG/INH AEPB Inhale 1 puff into the lungs daily. 07/20/19   Steele Sizer, MD  gabapentin (NEURONTIN) 100 MG capsule Take 1 capsule (100 mg total) by mouth at bedtime. 07/20/19   Steele Sizer, MD  hydrochlorothiazide (HYDRODIURIL) 12.5 MG tablet Take 1 tablet (12.5 mg total) by mouth daily. 07/20/19   Steele Sizer, MD  levofloxacin [facility])  500 MG tablet Take 1 tablet (500 mg total) by mouth daily. 07/20/19   Steele Sizer, MD  metaxalone (SKELAXIN) 800 MG tablet Take 1 tablet (800 mg total) by mouth 3 (three) times daily. 01/18/19   Steele Sizer, MD  metoprolol succinate (TOPROL-XL) 50 MG 24 hr tablet Take 1 tablet (50 mg total) by mouth daily. Take with or immediately following a meal. 07/20/19   Sowles, Drue Stager, MD  predniSONE (DELTASONE) 20 MG tablet Take 1 tablet (20 mg total) by mouth 2 (two) times daily with a meal. 07/20/19   Ancil Boozer, Drue Stager, MD  rosuvastatin  (CRESTOR) 40 MG tablet Take 1 tablet (40 mg total) by mouth daily. 07/20/19   Steele Sizer, MD    Family History  Problem Relation Age of Onset  . Cancer Mother        cervical, lung  . Lung cancer Mother   . Heart disease Father   . Hypertension Father   . COPD Father   . Breast cancer Maternal Aunt   . Ovarian cancer Maternal Aunt   . Spina bifida Sister   . Diabetes Son   . Diabetes Maternal Grandmother   . Heart disease Maternal Grandmother   . Parkinson's disease Paternal Grandfather      Social History   Tobacco Use  . Smoking status: Current Every Day Smoker    Packs/day: 1.00    Years: 37.00    Pack years: 37.00    Types: Cigarettes    Start date: 10/06/1987  . Smokeless tobacco: Never Used  Vaping Use  . Vaping Use: Never used  Substance Use Topics  . Alcohol use: Yes    Alcohol/week: 0.0 standard drinks    Comment: Occasional  . Drug use: No    Allergies as of 10/05/2019 - Review Complete 07/27/2019  Allergen Reaction Noted  . Hydroxychloroquine Itching 07/12/2015  . Penicillin g Hives 08/30/2014    Review of Systems:    All systems reviewed and negative except where noted in HPI.   Physical Exam:  LMP 12/24/2015 (Approximate)  Patient's last menstrual period was 12/24/2015 (approximate). Psych:  Alert and cooperative. Normal mood and affect. General:   Alert,  Well-developed, well-nourished, pleasant and cooperative in NAD Head:  Normocephalic and atraumatic. Lungs:  Respirations even and unlabored.  Clear throughout to auscultation.   No wheezes, crackles, or rhonchi. No acute distress. Heart:  Regular rate and rhythm; no murmurs, clicks, rubs, or gallops. Abdomen:  Normal bowel sounds.  No bruits.  Soft, non-tender and non-distended without masses, hepatosplenomegaly or hernias noted.  No guarding or rebound tenderness.    Neurologic:  Alert and oriented x3;  grossly normal neurologically. Psych:  Alert and cooperative. Normal mood and  affect.  Imaging Studies: No results found.  Assessment and Plan:   Nichole Wells is a 59 y.o. y/o female has been referred for fatty liver as well as colon cancer screening.  Fatty liver was incidentally noted on CT scan of the chest abdomen and pelvis performed to rule out dissection in June 2021.  Transaminases have been normal.  She also suffers from hypertension, hyperlipidemia and obesity.  Likely part of the metabolic syndrome.  Plan 1.  Fatty liver disease: Management would involve aggressively managing cardiovascular risk factors which would include hypertension, screening for prediabetes hyperlipidemia, weight loss active exercise with at least 150 minutes over 3 times a week of moderate intensive workout.  No present biochemical or radiological evidence of chronic liver disease including cirrhosis.  Patient information  provided fatty liver disease  2.  Colon cancer screening: Proceed with colonoscopy  I have discussed alternative options, risks & benefits,  which include, but are not limited to, bleeding, infection, perforation,respiratory complication & drug reaction.  The patient agrees with this plan & written consent will be obtained.     Follow up in as needed  Dr [name] MD,MRCP(U.K)

## 2019-10-07 ENCOUNTER — Other Ambulatory Visit
Admission: RE | Admit: 2019-10-07 | Discharge: 2019-10-07 | Disposition: A | Discharge status: Home / Self Care | Payer: No Typology Code available for payment source | Source: Ambulatory Visit | Attending: Gastroenterology | Admitting: Gastroenterology

## 2019-10-07 ENCOUNTER — Other Ambulatory Visit: Payer: Self-pay

## 2019-10-07 DIAGNOSIS — Z20822 Contact with and (suspected) exposure to covid-19: Secondary | ICD-10-CM | POA: Diagnosis not present

## 2019-10-07 DIAGNOSIS — Z01812 Encounter for preprocedural laboratory examination: Secondary | ICD-10-CM | POA: Diagnosis not present

## 2019-10-07 LAB — SARS CORONAVIRUS 2 (TAT 6-24 HRS): SARS Coronavirus 2: NEGATIVE

## 2019-10-11 ENCOUNTER — Other Ambulatory Visit: Payer: Self-pay | Admitting: Family Medicine

## 2019-10-11 ENCOUNTER — Ambulatory Visit: Payer: No Typology Code available for payment source | Admitting: Anesthesiology

## 2019-10-11 ENCOUNTER — Encounter
Admission: RE | Disposition: A | Discharge status: Home / Self Care | Payer: Self-pay | Source: Home / Self Care | Attending: Gastroenterology

## 2019-10-11 ENCOUNTER — Encounter: Payer: Self-pay | Admitting: Gastroenterology

## 2019-10-11 ENCOUNTER — Ambulatory Visit
Admission: RE | Admit: 2019-10-11 | Discharge: 2019-10-11 | Disposition: A | Discharge status: Home / Self Care | Payer: No Typology Code available for payment source | Attending: Gastroenterology | Admitting: Gastroenterology

## 2019-10-11 ENCOUNTER — Other Ambulatory Visit: Payer: Self-pay

## 2019-10-11 DIAGNOSIS — K635 Polyp of colon: Secondary | ICD-10-CM | POA: Diagnosis not present

## 2019-10-11 DIAGNOSIS — Z7982 Long term (current) use of aspirin: Secondary | ICD-10-CM | POA: Insufficient documentation

## 2019-10-11 DIAGNOSIS — I10 Essential (primary) hypertension: Secondary | ICD-10-CM | POA: Diagnosis not present

## 2019-10-11 DIAGNOSIS — Z79899 Other long term (current) drug therapy: Secondary | ICD-10-CM | POA: Diagnosis not present

## 2019-10-11 DIAGNOSIS — F419 Anxiety disorder, unspecified: Secondary | ICD-10-CM | POA: Diagnosis not present

## 2019-10-11 DIAGNOSIS — D122 Benign neoplasm of ascending colon: Secondary | ICD-10-CM | POA: Insufficient documentation

## 2019-10-11 DIAGNOSIS — Z7951 Long term (current) use of inhaled steroids: Secondary | ICD-10-CM | POA: Diagnosis not present

## 2019-10-11 DIAGNOSIS — Z8249 Family history of ischemic heart disease and other diseases of the circulatory system: Secondary | ICD-10-CM | POA: Insufficient documentation

## 2019-10-11 DIAGNOSIS — Z1211 Encounter for screening for malignant neoplasm of colon: Secondary | ICD-10-CM | POA: Diagnosis present

## 2019-10-11 DIAGNOSIS — K5730 Diverticulosis of large intestine without perforation or abscess without bleeding: Secondary | ICD-10-CM | POA: Diagnosis not present

## 2019-10-11 DIAGNOSIS — D123 Benign neoplasm of transverse colon: Secondary | ICD-10-CM | POA: Diagnosis not present

## 2019-10-11 DIAGNOSIS — F17210 Nicotine dependence, cigarettes, uncomplicated: Secondary | ICD-10-CM | POA: Diagnosis not present

## 2019-10-11 DIAGNOSIS — J3089 Other allergic rhinitis: Secondary | ICD-10-CM

## 2019-10-11 HISTORY — PX: COLONOSCOPY WITH PROPOFOL: SHX5780

## 2019-10-11 SURGERY — COLONOSCOPY WITH PROPOFOL
Anesthesia: General

## 2019-10-11 MED ORDER — SODIUM CHLORIDE 0.9 % IV SOLN: INTRAVENOUS | Status: DC

## 2019-10-11 MED ORDER — LIDOCAINE HCL (PF) 2 % IJ SOLN
INTRAMUSCULAR | Status: AC
  Filled 2019-10-11: qty 5

## 2019-10-11 MED ORDER — PROPOFOL 500 MG/50ML IV EMUL
INTRAVENOUS | Status: DC | PRN
  Administered 2019-10-11: 130 ug/kg/min via INTRAVENOUS

## 2019-10-11 MED ORDER — LIDOCAINE HCL (CARDIAC) PF 100 MG/5ML IV SOSY
PREFILLED_SYRINGE | INTRAVENOUS | Status: DC | PRN
  Administered 2019-10-11: 50 mg via INTRAVENOUS

## 2019-10-11 MED ORDER — EPHEDRINE SULFATE 50 MG/ML IJ SOLN
INTRAMUSCULAR | Status: DC | PRN
  Administered 2019-10-11: 10 mg via INTRAVENOUS
  Administered 2019-10-11: 15 mg via INTRAVENOUS

## 2019-10-11 MED ORDER — DEXMEDETOMIDINE HCL IN NACL 80 MCG/20ML IV SOLN
INTRAVENOUS | Status: AC
  Filled 2019-10-11: qty 20

## 2019-10-11 MED ORDER — PROPOFOL 10 MG/ML IV BOLUS
INTRAVENOUS | Status: DC | PRN
  Administered 2019-10-11: 90 mg via INTRAVENOUS

## 2019-10-11 MED ORDER — EPHEDRINE 5 MG/ML INJ
INTRAVENOUS | Status: AC
  Filled 2019-10-11: qty 4

## 2019-10-11 MED ORDER — DEXMEDETOMIDINE HCL 200 MCG/2ML IV SOLN
INTRAVENOUS | Status: DC | PRN
  Administered 2019-10-11 (×2): 20 ug via INTRAVENOUS

## 2019-10-11 MED ORDER — PHENYLEPHRINE HCL (PRESSORS) 10 MG/ML IV SOLN
INTRAVENOUS | Status: AC
  Filled 2019-10-11: qty 1

## 2019-10-11 MED ORDER — PROPOFOL 10 MG/ML IV BOLUS
INTRAVENOUS | Status: AC
  Filled 2019-10-11: qty 20

## 2019-10-11 MED ORDER — PROPOFOL 500 MG/50ML IV EMUL
INTRAVENOUS | Status: AC
  Filled 2019-10-11: qty 50

## 2019-10-11 NOTE — Op Note (Signed)
[facility] Gastroenterology Patient Name: Nichole Wells Procedure Date: 10/11/2019 7:42 AM MRN: [id number] Account #: [id number] Date of Birth: Jul 14, 1960 Admit Type: Outpatient Age: 59 Room: [facility] ENDO ROOM 1 Gender: Female Note Status: Finalized Procedure:             Colonoscopy Indications:           Screening for colorectal malignant neoplasm Providers:             [name] MD, MD Referring MD:          Bethena Roys. Sowles, MD (Referring MD) Medicines:             Monitored Anesthesia Care Complications:         No immediate complications. Procedure:             Pre-Anesthesia Assessment:                        - Prior to the procedure, a History and Physical was                         performed, and patient medications, allergies and                         sensitivities were reviewed. The patient's tolerance                         of previous anesthesia was reviewed.                        - The risks and benefits of the procedure and the                         sedation options and risks were discussed with the                         patient. All questions were answered and informed                         consent was obtained.                        - The risks and benefits of the procedure and the                         sedation options and risks were discussed with the                         patient. All questions were answered and informed                         consent was obtained.                        - ASA Grade Assessment: II - A patient with mild                         systemic disease.                        After obtaining informed consent, the  colonoscope was                         passed under direct vision. Throughout the procedure,                         the patient's blood pressure, pulse, and oxygen                         saturations were monitored continuously. The                         Colonoscope was introduced through  the anus and                         advanced to the the cecum, identified by the                         appendiceal orifice, IC valve and transillumination.                         The colonoscopy was performed with moderate difficulty                         due to significant looping. Successful completion of                         the procedure was aided by withdrawing the scope and                         replacing with the pediatric colonoscope. The quality                         of the bowel preparation was excellent. Findings:      The perianal and digital rectal examinations were normal.      Multiple small-mouthed diverticula were found in the sigmoid colon.      Three sessile polyps were found in the ascending colon. The polyps were       4 to 6 mm in size. These polyps were removed with a cold snare.       Resection and retrieval were complete.      Two sessile polyps were found in the transverse colon. The polyps were 4       to 6 mm in size. These polyps were removed with a cold snare. Resection       and retrieval were complete.      A 13 mm polyp was found in the transverse colon. The polyp was sessile.       Polypectomy was attempted, initially using a cold snare. Polyp resection       was incomplete with this device. This intervention then required a       different device and polypectomy technique. The polyp was removed with a       cold biopsy forceps. Resection and retrieval were complete. To prevent       bleeding after the polypectomy, two hemostatic clips were successfully       placed. There was no bleeding during, or at the end, of the procedure.      The exam was otherwise without abnormality on direct and retroflexion  views. Impression:            - Diverticulosis in the sigmoid colon.                        - Three 4 to 6 mm polyps in the ascending colon,                         removed with a cold snare. Resected and retrieved.                         - Two 4 to 6 mm polyps in the transverse colon,                         removed with a cold snare. Resected and retrieved.                        - One 13 mm polyp in the transverse colon, removed                         with a cold biopsy forceps. Resected and retrieved.                         Clips were placed.                        - The examination was otherwise normal on direct and                         retroflexion views. Recommendation:        - Discharge patient to home.                        - Advance diet as tolerated.                        - Continue present medications.                        - Await pathology results.                        - Repeat colonoscopy in 6 months for surveillance                         after piecemeal polypectomy. Procedure Code(s):     --- Professional ---                        [phone], Colonoscopy, flexible; with removal of                         tumor(s), polyp(s), or other lesion(s) by snare                         technique                        [id number], 59, Colonoscopy, flexible; with biopsy, single  or multiple Diagnosis Code(s):     --- Professional ---                        K63.5, Polyp of colon                        Z12.11, Encounter for screening for malignant neoplasm                         of colon                        K57.30, Diverticulosis of large intestine without                         perforation or abscess without bleeding CPT copyright 2019 American [employer]. All rights reserved. The codes documented in this report are preliminary and upon coder review may  be revised to meet current compliance requirements. [name], MD [name] MD, MD 10/11/2019 8:24:55 AM This report has been signed electronically. Number of Addenda: 0 Note Initiated On: 10/11/2019 7:42 AM Scope Withdrawal Time: 0 hours 22 minutes 10 seconds  Total Procedure Duration: 0 hours 33 minutes 46 seconds   Estimated Blood Loss:  Estimated blood loss: none.      [facility]

## 2019-10-11 NOTE — Anesthesia Preprocedure Evaluation (Addendum)
Anesthesia Evaluation  Patient identified by MRN, date of birth, ID band Patient awake    Reviewed: Allergy & Precautions, NPO status , Patient's Chart, lab work & pertinent test results  History of Anesthesia Complications Negative for: history of anesthetic complications  Airway Mallampati: III  TM Distance: >3 FB Neck ROM: Full    Dental  (+) Poor Dentition   Pulmonary COPD,  COPD inhaler, Current Smoker and Patient abstained from smoking.,    breath sounds clear to auscultation- rhonchi (-) wheezing      Cardiovascular Exercise Tolerance: Good hypertension, Pt. on medications (-) CAD, (-) Past MI, (-) Cardiac Stents and (-) CABG  Rhythm:Regular Rate:Normal - Systolic murmurs and - Diastolic murmurs    Neuro/Psych neg Seizures PSYCHIATRIC DISORDERS Anxiety Depression negative neurological ROS     GI/Hepatic Neg liver ROS, GERD  ,  Endo/Other  negative endocrine ROSneg diabetes  Renal/GU negative Renal ROS     Musculoskeletal  (+) Arthritis ,   Abdominal (+) + obese,   Peds  Hematology negative hematology ROS (+)   Anesthesia Other Findings Past Medical History: No date: Anxiety No date: Arthritis     Comment:  poss ra No date: GERD (gastroesophageal reflux disease)     Comment:  occ No date: Heart murmur No date: Hypertension No date: Inflammatory arthritis No date: Vitamin D deficiency   Reproductive/Obstetrics                             Anesthesia Physical Anesthesia Plan  ASA: III  Anesthesia Plan: General   Post-op Pain Management:    Induction: Intravenous  PONV Risk Score and Plan: 1 and Propofol infusion  Airway Management Planned: Natural Airway  Additional Equipment:   Intra-op Plan:   Post-operative Plan:   Informed Consent: I have reviewed the patients History and Physical, chart, labs and discussed the procedure including the risks, benefits and  alternatives for the proposed anesthesia with the patient or authorized representative who has indicated his/her understanding and acceptance.     Dental advisory given  Plan Discussed with: CRNA and Anesthesiologist  Anesthesia Plan Comments:         Anesthesia Quick Evaluation

## 2019-10-11 NOTE — Telephone Encounter (Signed)
Requested Prescriptions  Pending Prescriptions Disp Refills  . fluticasone (FLONASE) 50 MCG/ACT nasal spray [Pharmacy Med Name: FLUTICASONE PROP 50 MCG SPRAY] 48 mL 3    Sig: SPRAY 2 SPRAYS INTO EACH NOSTRIL EVERY DAY     Ear, Nose, and Throat: Nasal Preparations - Corticosteroids Passed - 10/11/2019  1:54 AM      Passed - Valid encounter within last 12 months    Recent Outpatient Visits          2 months ago Thoracic aorta atherosclerosis [facility])   [facility], Drue Stager, MD   8 months ago Recurrent major depressive disorder, in remission [facility])   [facility] Steele Sizer, MD   1 year ago Diabetes mellitus screening   [facility] Steele Sizer, MD   1 year ago Pulmonary emphysema, unspecified emphysema type [facility])   [facility] Steele Sizer, MD   1 year ago Essential hypertension   [facility] Steele Sizer, MD      Future Appointments            In 3 months Ancil Boozer, Drue Stager, MD [facility], [facility]

## 2019-10-11 NOTE — Anesthesia Postprocedure Evaluation (Signed)
Anesthesia Post Note  Patient: Akita Maxim  Procedure(s) Performed: COLONOSCOPY WITH PROPOFOL (N/A )  Patient location during evaluation: Endoscopy Anesthesia Type: General Level of consciousness: awake and alert and oriented Pain management: pain level controlled Vital Signs Assessment: post-procedure vital signs reviewed and stable Respiratory status: spontaneous breathing, nonlabored ventilation and respiratory function stable Cardiovascular status: blood pressure returned to baseline and stable Postop Assessment: no signs of nausea or vomiting Anesthetic complications: no   No complications documented.   Last Vitals:  Vitals:   10/11/19 0835 10/11/19 0845  BP: 99/60 (!) 116/58  Pulse: 81 73  Resp: 13 20  Temp:    SpO2: 99% 99%    Last Pain:  Vitals:   10/11/19 0845  TempSrc:   PainSc: 0-No pain                 Thomasene Dubow

## 2019-10-11 NOTE — Transfer of Care (Signed)
Immediate Anesthesia Transfer of Care Note  Patient: Nichole Wells  Procedure(s) Performed: COLONOSCOPY WITH PROPOFOL (N/A )  Patient Location: PACU  Anesthesia Type:General  Level of Consciousness: drowsy and patient cooperative  Airway & Oxygen Therapy: Patient Spontanous Breathing and Patient connected to nasal cannula oxygen  Post-op Assessment: Report given to RN and Post -op Vital signs reviewed and stable  Post vital signs: Reviewed and stable  Last Vitals:  Vitals Value Taken Time  BP 91/44 10/11/19 0825  Temp 36.3 C 10/11/19 0825  Pulse 86 10/11/19 0826  Resp 15 10/11/19 0826  SpO2 96 % 10/11/19 0826  Vitals shown include unvalidated device data.  Last Pain:  Vitals:   10/11/19 0716  TempSrc: Oral  PainSc: 0-No pain         Complications: No complications documented.

## 2019-10-11 NOTE — H&P (Signed)
[name], MD [address] [place], [place], [place], [address] [address], [place], [place], [place], [address] Phone: [phone]  Fax: [phone]  Primary Care Physician:  Steele Sizer, MD   Pre-Procedure History & Physical: HPI:  Virgilene Stryker is a 59 y.o. female is here for an colonoscopy.   Past Medical History:  Diagnosis Date  . Anxiety   . Arthritis    poss ra  . GERD (gastroesophageal reflux disease)    occ  . Heart murmur   . Hypertension   . Inflammatory arthritis   . Vitamin D deficiency     Past Surgical History:  Procedure Laterality Date  . ABDOMINAL HYSTERECTOMY    . BREAST BIOPSY Left    stereo- neg  . BREAST CYST ASPIRATION Right    neg  . BREAST SURGERY     benign biopsy  . CHOLECYSTECTOMY    . DILATION AND CURETTAGE OF UTERUS    . LAPAROSCOPIC LYSIS OF ADHESIONS  12/10/2015   Procedure: LAPAROSCOPIC LYSIS OF ADHESIONS;  Surgeon: Brayton Mars, MD;  Location: [facility] ORS;  Service: Gynecology;;  . LAPAROSCOPIC VAGINAL HYSTERECTOMY WITH SALPINGO OOPHORECTOMY Bilateral 01/28/2016   Procedure: LAPAROSCOPIC ASSISTED VAGINAL HYSTERECTOMY WITH SALPINGO OOPHORECTOMY;  Surgeon: Brayton Mars, MD;  Location: [facility];  Service: Gynecology;  Laterality: Bilateral;  . LAPAROSCOPY N/A 12/10/2015   Procedure: LAPAROSCOPY DIAGNOSTIC WITH BIOPSIES;  Surgeon: Brayton Mars, MD;  Location: [facility];  Service: Gynecology;  Laterality: N/A;  . SHOULDER ARTHROSCOPY Right   . TUBAL LIGATION    . UTERINE FIBROID EMBOLIZATION      Prior to Admission medications   Medication Sig Start Date End Date Taking? Authorizing Provider  amLODipine (NORVASC) 10 MG tablet Take 1 tablet (10 mg total) by mouth daily. 07/20/19  Yes Sowles, Drue Stager, MD  aspirin 81 MG EC tablet TAKE 1 TABLET BY MOUTH EVERY DAY 01/23/19  Yes Sowles, Drue Stager, MD  cetirizine (ZYRTEC) 10 MG tablet Take 10 mg by mouth daily.   Yes [provider]  FLUoxetine  (PROZAC) 40 MG capsule Take 1 capsule (40 mg total) by mouth daily. 07/20/19  Yes Sowles, Drue Stager, MD  fluticasone (FLONASE) 50 MCG/ACT nasal spray SPRAY 2 SPRAYS INTO EACH NOSTRIL EVERY DAY 10/11/19  Yes Sowles, Drue Stager, MD  gabapentin (NEURONTIN) 100 MG capsule Take 1 capsule (100 mg total) by mouth at bedtime. 07/20/19  Yes Sowles, Drue Stager, MD  hydrochlorothiazide (HYDRODIURIL) 12.5 MG tablet Take 1 tablet (12.5 mg total) by mouth daily. 07/20/19  Yes Sowles, Drue Stager, MD  metoprolol succinate (TOPROL-XL) 50 MG 24 hr tablet Take 1 tablet (50 mg total) by mouth daily. Take with or immediately following a meal. 07/20/19  Yes Sowles, Drue Stager, MD  rosuvastatin (CRESTOR) 40 MG tablet Take 1 tablet (40 mg total) by mouth daily. 07/20/19  Yes Sowles, Drue Stager, MD  Vitamin D, Cholecalciferol, 50 MCG (2000 UT) CAPS Take by mouth daily.   Yes [provider]  Cholecalciferol (VITAMIN D) 50 MCG (2000 UT) CAPS Take 1 capsule (2,000 Units total) by mouth daily. Patient not taking: Reported on 10/05/2019 01/18/19   Steele Sizer, MD  Fluticasone-Umeclidin-Vilant (TRELEGY ELLIPTA) 100-62.5-25 MCG/INH AEPB Inhale 1 puff into the lungs daily. 07/20/19   Steele Sizer, MD  levofloxacin (LEVAQUIN) 500 MG tablet Take 1 tablet (500 mg total) by mouth daily. Patient not taking: Reported on 10/05/2019 07/20/19   Steele Sizer, MD  metaxalone (SKELAXIN) 800 MG tablet Take 1 tablet (800 mg total) by  mouth 3 (three) times daily. 01/18/19   Steele Sizer, MD  polyethylene glycol (GOLYTELY) 236 g solution Drink 8 oz every 20-30 minutes until entire prep is finished. 10/05/19   [name], MD  predniSONE (DELTASONE) 20 MG tablet Take 1 tablet (20 mg total) by mouth 2 (two) times daily with a meal. Patient not taking: Reported on 10/05/2019 07/20/19   Steele Sizer, MD    Allergies as of 10/05/2019 - Review Complete 10/05/2019  Allergen Reaction Noted  . Hydroxychloroquine Itching 07/12/2015  . Penicillin g Hives 08/30/2014     Family History  Problem Relation Age of Onset  . Cancer Mother        cervical, lung  . Lung cancer Mother   . Heart disease Father   . Hypertension Father   . COPD Father   . Breast cancer Maternal Aunt   . Ovarian cancer Maternal Aunt   . Spina bifida Sister   . Diabetes Son   . Diabetes Maternal Grandmother   . Heart disease Maternal Grandmother   . Parkinson's disease Paternal Grandfather     Social History   Socioeconomic History  . Marital status: Married    Spouse name: [name]   . Number of children: 2  . Years of education: Not on file  . Highest education level: Some college, no degree  Occupational History  . Occupation: Freight forwarder  Tobacco Use  . Smoking status: Current Every Day Smoker    Packs/day: 1.00    Years: 37.00    Pack years: 37.00    Types: Cigarettes    Start date: 10/06/1987  . Smokeless tobacco: Never Used  Vaping Use  . Vaping Use: Never used  Substance and Sexual Activity  . Alcohol use: Yes    Alcohol/week: 0.0 standard drinks    Comment: none last 24hr  . Drug use: No  . Sexual activity: Yes    Partners: Male    Birth control/protection: Surgical  Other Topics Concern  . Not on file  Social History Narrative   Married   Works full time   Has 2 grown children    Social Determinants of Radio broadcast assistant Strain: Low Risk   . Difficulty of Paying Living Expenses: Not hard at all  Food Insecurity: No Food Insecurity  . Worried About Charity fundraiser in the Last Year: Never true  . Ran Out of Food in the Last Year: Never true  Transportation Needs: No Transportation Needs  . Lack of Transportation (Medical): No  . Lack of Transportation (Non-Medical): No  Physical Activity: Inactive  . Days of Exercise per Week: 2 days  . Minutes of Exercise per Session: 0 min  Stress: No Stress Concern Present  . Feeling of Stress : Not at all  Social Connections: Moderately Isolated  . Frequency of Communication with Friends and  Family: Twice a week  . Frequency of Social Gatherings with Friends and Family: Once a week  . Attends Religious Services: Never  . Active Member of Clubs or Organizations: No  . Attends Archivist Meetings: Never  . Marital Status: Married  Human resources officer Violence: Not At Risk  . Fear of Current or Ex-Partner: No  . Emotionally Abused: No  . Physically Abused: No  . Sexually Abused: No    Review of Systems: See HPI, otherwise negative ROS  Physical Exam: BP (!) 149/76   Pulse 88   Temp 98.2 F (36.8 C) (Oral)   Resp 16  Ht 5\' 1"  (1.549 m)   Wt 81.6 kg   LMP 12/24/2015 (Approximate)   SpO2 95%   BMI 34.01 kg/m  General:   Alert,  pleasant and cooperative in NAD Head:  Normocephalic and atraumatic. Neck:  Supple; no masses or thyromegaly. Lungs:  Clear throughout to auscultation, normal respiratory effort.    Heart:  +S1, +S2, Regular rate and rhythm, No edema. Abdomen:  Soft, nontender and nondistended. Normal bowel sounds, without guarding, and without rebound.   Neurologic:  Alert and  oriented x4;  grossly normal neurologically.  Impression/Plan: Landrie Beale is here for an colonoscopy to be performed for Screening colonoscopy average risk   Risks, benefits, limitations, and alternatives regarding  colonoscopy have been reviewed with the patient.  Questions have been answered.  All parties agreeable.   [name], MD  10/11/2019, 7:42 AM

## 2019-10-12 ENCOUNTER — Encounter: Payer: Self-pay | Admitting: Gastroenterology

## 2019-10-12 LAB — SURGICAL PATHOLOGY

## 2019-10-13 ENCOUNTER — Telehealth: Payer: Self-pay

## 2019-10-13 NOTE — Telephone Encounter (Signed)
-----   Message from [name], MD sent at 10/12/2019  8:56 AM EDT ----- Nichole Wells   Inform patient that the polyps were all tubular adenomas. Since the larger one was taken out in multiple pieces we need to go back in 6 months to repeat colonoscopy and check if any fragments have been left behind. Please set up a reminder in the system  C/c Steele Sizer, MD   Dr [name] MD,MRCP [facility]) Gastroenterology/Hepatology Pager: [phone]

## 2019-10-13 NOTE — Telephone Encounter (Signed)
Pt has been notified of results and Dr. Anna's recommendations. 

## 2019-11-15 ENCOUNTER — Ambulatory Visit
Admission: RE | Admit: 2019-11-15 | Discharge: 2019-11-15 | Disposition: A | Discharge status: Home / Self Care | Payer: No Typology Code available for payment source | Source: Ambulatory Visit | Attending: Oncology | Admitting: Oncology

## 2019-11-15 ENCOUNTER — Other Ambulatory Visit: Payer: Self-pay

## 2019-11-15 DIAGNOSIS — R911 Solitary pulmonary nodule: Secondary | ICD-10-CM | POA: Insufficient documentation

## 2019-11-16 ENCOUNTER — Inpatient Hospital Stay: Payer: No Typology Code available for payment source | Attending: Oncology | Admitting: Oncology

## 2019-11-16 DIAGNOSIS — Z90722 Acquired absence of ovaries, bilateral: Secondary | ICD-10-CM | POA: Insufficient documentation

## 2019-11-16 DIAGNOSIS — Z79899 Other long term (current) drug therapy: Secondary | ICD-10-CM | POA: Insufficient documentation

## 2019-11-16 DIAGNOSIS — Z800 Family history of malignant neoplasm of digestive organs: Secondary | ICD-10-CM | POA: Diagnosis not present

## 2019-11-16 DIAGNOSIS — R062 Wheezing: Secondary | ICD-10-CM

## 2019-11-16 DIAGNOSIS — J449 Chronic obstructive pulmonary disease, unspecified: Secondary | ICD-10-CM | POA: Diagnosis not present

## 2019-11-16 DIAGNOSIS — F329 Major depressive disorder, single episode, unspecified: Secondary | ICD-10-CM | POA: Diagnosis not present

## 2019-11-16 DIAGNOSIS — R911 Solitary pulmonary nodule: Secondary | ICD-10-CM | POA: Insufficient documentation

## 2019-11-16 DIAGNOSIS — Z803 Family history of malignant neoplasm of breast: Secondary | ICD-10-CM | POA: Insufficient documentation

## 2019-11-16 DIAGNOSIS — I10 Essential (primary) hypertension: Secondary | ICD-10-CM | POA: Diagnosis not present

## 2019-11-16 DIAGNOSIS — Z8041 Family history of malignant neoplasm of ovary: Secondary | ICD-10-CM | POA: Diagnosis not present

## 2019-11-16 DIAGNOSIS — Z7952 Long term (current) use of systemic steroids: Secondary | ICD-10-CM | POA: Insufficient documentation

## 2019-11-16 DIAGNOSIS — F17210 Nicotine dependence, cigarettes, uncomplicated: Secondary | ICD-10-CM | POA: Insufficient documentation

## 2019-11-16 DIAGNOSIS — Z9079 Acquired absence of other genital organ(s): Secondary | ICD-10-CM | POA: Diagnosis not present

## 2019-11-16 DIAGNOSIS — E785 Hyperlipidemia, unspecified: Secondary | ICD-10-CM | POA: Diagnosis not present

## 2019-11-16 DIAGNOSIS — Z801 Family history of malignant neoplasm of trachea, bronchus and lung: Secondary | ICD-10-CM | POA: Diagnosis not present

## 2019-11-16 DIAGNOSIS — F419 Anxiety disorder, unspecified: Secondary | ICD-10-CM | POA: Diagnosis not present

## 2019-11-16 DIAGNOSIS — Z90710 Acquired absence of both cervix and uterus: Secondary | ICD-10-CM | POA: Insufficient documentation

## 2019-11-16 DIAGNOSIS — K219 Gastro-esophageal reflux disease without esophagitis: Secondary | ICD-10-CM | POA: Diagnosis not present

## 2019-11-16 DIAGNOSIS — Z87891 Personal history of nicotine dependence: Secondary | ICD-10-CM

## 2019-11-16 DIAGNOSIS — Z7982 Long term (current) use of aspirin: Secondary | ICD-10-CM | POA: Insufficient documentation

## 2019-11-16 DIAGNOSIS — Z833 Family history of diabetes mellitus: Secondary | ICD-10-CM | POA: Diagnosis not present

## 2019-11-16 DIAGNOSIS — Z8249 Family history of ischemic heart disease and other diseases of the circulatory system: Secondary | ICD-10-CM | POA: Diagnosis not present

## 2019-11-16 NOTE — Progress Notes (Signed)
Pulmonary Nodule Clinic Consult note Nichole Wells  [phone] [phone]  Patient Care Team: Steele Sizer, MD as PCP - General (Family Medicine)   Name of the patient: Nichole Wells  [id number]  1960/07/10   Date of visit: 11/16/2019   Diagnosis- Lung Nodule  Chief complaint/ Reason for visit- Pulmonary Nodule Clinic Initial Visit  Past Medical History:  Nichole Wells is a 59 year old female with past medical history significant for hypertension, aortic sclerosis, emphysema, degenerative disc disease, arthritis, anxiety and depression, hyperlipidemia, insomnia, vitamin D deficiency who was recently evaluated in [facility] emergency room for back pain.  Work-up included CT angio to rule out dissection and found to have patchy groundglass opacities in the aspect of the left upper lobe medially likely postinflammatory in nature but short-term follow-up was recommended.  Interval history-Nichole Wells presents today to review her most recent CT scan.  She is a current everyday smoker she smokes 1-1/2 packs of cigarettes per day for the past 50+ years.  She is trying to quit smoking.  She currently works for [employer].  Denies any known occupational exposure to toxins known to cause cancer.  She is married and lives at home with her husband.  Endorses shortness of breath and wheezing daily.  She was prescribed an inhaler from her PCP for COPD which she has completed.  She is currently not on any maintenance COPD medications.  She has no personal history of cancer.  Has strong family history of cancer.  Her mom died from small cell lung cancer at the age of 78, several of her aunts had GYN malignancies including cervical and ovarian cancer and her father just passed away from COPD.  She also has several family members on her mom side that died of a GI cancer.  Has not had genetic work-up in the past.  ECOG FS:0 - Asymptomatic  Review of  systems- Review of Systems  Constitutional: Positive for malaise/fatigue. Negative for chills, fever and weight loss.  HENT: Negative for congestion, ear pain and tinnitus.   Eyes: Negative.  Negative for blurred vision and double vision.  Respiratory: Positive for shortness of breath and wheezing. Negative for cough and sputum production.   Cardiovascular: Negative.  Negative for chest pain, palpitations and leg swelling.  Gastrointestinal: Negative.  Negative for abdominal pain, constipation, diarrhea, nausea and vomiting.  Genitourinary: Negative for dysuria, frequency and urgency.  Musculoskeletal: Negative for back pain and falls.  Skin: Negative.  Negative for rash.  Neurological: Negative.  Negative for weakness and headaches.  Endo/Heme/Allergies: Negative.  Does not bruise/bleed easily.  Psychiatric/Behavioral: Negative.  Negative for depression. The patient is not nervous/anxious and does not have insomnia.      Allergies  Allergen Reactions  . Hydroxychloroquine Itching    Pt take zyrtec to alleviate symptoms   . Penicillin G Hives    Has patient had a PCN reaction causing immediate rash, facial/tongue/throat swelling, SOB or lightheadedness with hypotension:unsure Has patient had a PCN reaction causing severe rash involving mucus membranes or skin necrosis:unsure Has patient had a PCN reaction that required hospitalization:unsure Has patient had a PCN reaction occurring within the last 10 years:No If all of the above answers are "NO", then may proceed with Cephalosporin use.      Past Medical History:  Diagnosis Date  . Anxiety   . Arthritis    poss ra  . GERD (gastroesophageal reflux disease)    occ  . Heart murmur   .  Hypertension   . Inflammatory arthritis   . Vitamin D deficiency      Past Surgical History:  Procedure Laterality Date  . ABDOMINAL HYSTERECTOMY    . BREAST BIOPSY Left    stereo- neg  . BREAST CYST ASPIRATION Right    neg  . BREAST  SURGERY     benign biopsy  . CHOLECYSTECTOMY    . COLONOSCOPY WITH PROPOFOL N/A 10/11/2019   Procedure: COLONOSCOPY WITH PROPOFOL;  Surgeon: [name], MD;  Location: [facility];  Service: Gastroenterology;  Laterality: N/A;  . DILATION AND CURETTAGE OF UTERUS    . LAPAROSCOPIC LYSIS OF ADHESIONS  12/10/2015   Procedure: LAPAROSCOPIC LYSIS OF ADHESIONS;  Surgeon: Brayton Mars, MD;  Location: [facility] ORS;  Service: Gynecology;;  . LAPAROSCOPIC VAGINAL HYSTERECTOMY WITH SALPINGO OOPHORECTOMY Bilateral 01/28/2016   Procedure: LAPAROSCOPIC ASSISTED VAGINAL HYSTERECTOMY WITH SALPINGO OOPHORECTOMY;  Surgeon: Brayton Mars, MD;  Location: [facility] ORS;  Service: Gynecology;  Laterality: Bilateral;  . LAPAROSCOPY N/A 12/10/2015   Procedure: LAPAROSCOPY DIAGNOSTIC WITH BIOPSIES;  Surgeon: Brayton Mars, MD;  Location: [facility] ORS;  Service: Gynecology;  Laterality: N/A;  . SHOULDER ARTHROSCOPY Right   . TUBAL LIGATION    . UTERINE FIBROID EMBOLIZATION      Social History   Socioeconomic History  . Marital status: Married    Spouse name: Nichole Wells   . Number of children: 2  . Years of education: Not on file  . Highest education level: Some college, no degree  Occupational History  . Occupation: Freight forwarder  Tobacco Use  . Smoking status: Current Every Day Smoker    Packs/day: 1.00    Years: 37.00    Pack years: 37.00    Types: Cigarettes    Start date: 10/06/1987  . Smokeless tobacco: Never Used  Vaping Use  . Vaping Use: Never used  Substance and Sexual Activity  . Alcohol use: Yes    Alcohol/week: 0.0 standard drinks    Comment: none last 24hr  . Drug use: No  . Sexual activity: Yes    Partners: Male    Birth control/protection: Surgical  Other Topics Concern  . Not on file  Social History Narrative   Married   Works full time   Has 2 grown children    Social Determinants of Radio broadcast assistant Strain: Low Risk   . Difficulty of Paying Living Expenses:  Not hard at all  Food Insecurity: No Food Insecurity  . Worried About Charity fundraiser in the Last Year: Never true  . Ran Out of Food in the Last Year: Never true  Transportation Needs: No Transportation Needs  . Lack of Transportation (Medical): No  . Lack of Transportation (Non-Medical): No  Physical Activity: Inactive  . Days of Exercise per Week: 2 days  . Minutes of Exercise per Session: 0 min  Stress: No Stress Concern Present  . Feeling of Stress : Not at all  Social Connections: Moderately Isolated  . Frequency of Communication with Friends and Family: Twice a week  . Frequency of Social Gatherings with Friends and Family: Once a week  . Attends Religious Services: Never  . Active Member of Clubs or Organizations: No  . Attends Archivist Meetings: Never  . Marital Status: Married  Human resources officer Violence: Not At Risk  . Fear of Current or Ex-Partner: No  . Emotionally Abused: No  . Physically Abused: No  . Sexually Abused: No    Family History  Problem Relation Age of Onset  . Cancer Mother        cervical, lung  . Lung cancer Mother   . Heart disease Father   . Hypertension Father   . COPD Father   . Breast cancer Maternal Aunt   . Ovarian cancer Maternal Aunt   . Spina bifida Sister   . Diabetes Son   . Diabetes Maternal Grandmother   . Heart disease Maternal Grandmother   . Parkinson's disease Paternal Grandfather      Current Outpatient Medications:  .  amLODipine (NORVASC) 10 MG tablet, Take 1 tablet (10 mg total) by mouth daily., Disp: 90 tablet, Rfl: 1 .  aspirin 81 MG EC tablet, TAKE 1 TABLET BY MOUTH EVERY DAY, Disp: 30 tablet, Rfl: 5 .  cetirizine (ZYRTEC) 10 MG tablet, Take 10 mg by mouth daily., Disp: , Rfl:  .  Cholecalciferol (VITAMIN D) 50 MCG (2000 UT) CAPS, Take 1 capsule (2,000 Units total) by mouth daily. (Patient not taking: Reported on 10/05/2019), Disp: 30 capsule, Rfl: 0 .  FLUoxetine (PROZAC) 40 MG capsule, Take 1  capsule (40 mg total) by mouth daily., Disp: 90 capsule, Rfl: 1 .  fluticasone (FLONASE) 50 MCG/ACT nasal spray, SPRAY 2 SPRAYS INTO EACH NOSTRIL EVERY DAY, Disp: 48 mL, Rfl: 3 .  Fluticasone-Umeclidin-Vilant (TRELEGY ELLIPTA) 100-62.5-25 MCG/INH AEPB, Inhale 1 puff into the lungs daily., Disp: 60 each, Rfl: 5 .  gabapentin (NEURONTIN) 100 MG capsule, Take 1 capsule (100 mg total) by mouth at bedtime., Disp: 90 capsule, Rfl: 1 .  hydrochlorothiazide (HYDRODIURIL) 12.5 MG tablet, Take 1 tablet (12.5 mg total) by mouth daily., Disp: 90 tablet, Rfl: 1 .  levofloxacin (LEVAQUIN) 500 MG tablet, Take 1 tablet (500 mg total) by mouth daily. (Patient not taking: Reported on 10/05/2019), Disp: 7 tablet, Rfl: 0 .  metaxalone (SKELAXIN) 800 MG tablet, Take 1 tablet (800 mg total) by mouth 3 (three) times daily., Disp: 90 tablet, Rfl: 0 .  metoprolol succinate (TOPROL-XL) 50 MG 24 hr tablet, Take 1 tablet (50 mg total) by mouth daily. Take with or immediately following a meal., Disp: 90 tablet, Rfl: 1 .  polyethylene glycol (GOLYTELY) 236 g solution, Drink 8 oz every 20-30 minutes until entire prep is finished., Disp: 4000 mL, Rfl: 0 .  predniSONE (DELTASONE) 20 MG tablet, Take 1 tablet (20 mg total) by mouth 2 (two) times daily with a meal. (Patient not taking: Reported on 10/05/2019), Disp: 10 tablet, Rfl: 0 .  rosuvastatin (CRESTOR) 40 MG tablet, Take 1 tablet (40 mg total) by mouth daily., Disp: 90 tablet, Rfl: 1 .  Vitamin D, Cholecalciferol, 50 MCG (2000 UT) CAPS, Take by mouth daily., Disp: , Rfl:   Physical exam: There were no vitals filed for this visit. Physical Exam Constitutional:      Appearance: Normal appearance.  HENT:     Head: Normocephalic and atraumatic.  Eyes:     Pupils: Pupils are equal, round, and reactive to light.  Cardiovascular:     Rate and Rhythm: Normal rate and regular rhythm.     Heart sounds: Normal heart sounds. No murmur heard.   Pulmonary:     Effort: Pulmonary effort  is normal.     Breath sounds: Normal breath sounds. No wheezing.  Abdominal:     General: Bowel sounds are normal. There is no distension.     Palpations: Abdomen is soft.     Tenderness: There is no abdominal tenderness.  Musculoskeletal:  General: Normal range of motion.     Cervical back: Normal range of motion.  Skin:    General: Skin is warm and dry.     Findings: No rash.  Neurological:     Mental Status: She is alert and oriented to person, place, and time.  Psychiatric:        Judgment: Judgment normal.      CMP Latest Ref Rng & Units 07/27/2019  Glucose 70 - 99 mg/dL 93  BUN 6 - 20 mg/dL 19  Creatinine 0.44 - 1.00 mg/dL 0.74  Sodium 135 - 145 mmol/L 137  Potassium 3.5 - 5.1 mmol/L 4.1  Chloride 98 - 111 mmol/L 102  CO2 22 - 32 mmol/L 27  Calcium 8.9 - 10.3 mg/dL 10.0  Total Protein 6.5 - 8.1 g/dL 6.9  Total Bilirubin 0.3 - 1.2 mg/dL 0.6  Alkaline Phos 38 - 126 U/L 95  AST 15 - 41 U/L 22  ALT 0 - 44 U/L 29   CBC Latest Ref Rng & Units 07/27/2019  WBC 4.0 - 10.5 K/uL 17.4(H)  Hemoglobin 12.0 - 15.0 g/dL 14.2  Hematocrit 36 - 46 % 40.4  Platelets 150 - 400 K/uL 365    No images are attached to the encounter.  CT Chest Wo Contrast  Result Date: 11/15/2019 CLINICAL DATA:  Follow-up lung nodule. Smoker with 37 pack-year history. EXAM: CT CHEST WITHOUT CONTRAST TECHNIQUE: Multidetector CT imaging of the chest was performed following the standard protocol without IV contrast. COMPARISON:  CTA CAP 07/27/2019 and CT chest 11/10/2017 FINDINGS: Cardiovascular: The heart size appears within normal limits. No pericardial effusion. Aortic atherosclerosis. Mediastinum/Nodes: No enlarged mediastinal or axillary lymph nodes. Thyroid gland, trachea, and esophagus demonstrate no significant findings. Lungs/Pleura: Centrilobular and paraseptal emphysema is noted. No pleural effusion, airspace consolidation or atelectasis. Previously noted ground-glass densities within the  anterior portions of the upper lobes appear improved from previous exam. -2 mm anterior left upper lobe lung nodule is unchanged from 11/10/2017 compatible with a benign abnormality, image 35/3. -Also unchanged from 11/10/2017 is a 2 mm nodule within the anterior right lung apex, image 31/3. Upper Abdomen: No acute abnormality.  Previous cholecystectomy. Musculoskeletal: Degenerative disc disease noted within the thoracic spine. No acute or suspicious osseous findings. IMPRESSION: 1. No acute cardiopulmonary abnormalities. 2. Stable appearance of small bilateral upper lobe lung nodules. These are unchanged from 11/10/2017 compatible with a benign abnormality. 3. Previous anterior upper lobe areas of ground-glass attenuation have improved in the interval compatible with postinflammatory/infectious process. 4. Emphysema and aortic atherosclerosis. Aortic Atherosclerosis (ICD10-I70.0) and Emphysema (ICD10-J43.9). Electronically Signed   By: Kerby Moors M.D.   On: 11/15/2019 10:46     Assessment and plan- Patient is a 59 y.o. female who presents to pulmonary nodule clinic for follow-up of incidental lung nodules.     CT chest without contrast from today reveals no acute cardiopulmonary abnormalities.  Stable appearance of small bilateral upper lobe lung nodules.  These are unchanged from 11/10/2017 compatible with a benign abnormality.  Previous anterior upper lobe areas of groundglass attenuation have improved in the interval compatible with postinflammatory/infectious process.  Emphysema and aortic arthrosclerosis   Calculating malignancy probability of a pulmonary nodule: Risk factors include: 1.  Age. 2.  Cancer history. 3.  Diameter of pulmonary nodule and mm 4.  Location 5.  Smoking history 6.  Spiculation present   Based on risk factors, this patient is  high risk for the development of lung cancer.  Given were able to compare imaging for about 2 years now that has been stable, these are  likely benign nodules.  She was previously enrolled in the low-dose CT screening program and I would like to refer her back.    During our visit, we discussed pulmonary nodules are a common incidental finding and are often how lung cancer is discovered.  Lung cancer survival is directly related to the stage at diagnosis.  We discussed that nodules can vary in presentation from solitary pulmonary nodules to masses, 2 groundglass opacities and multiple nodules.  Pulmonary nodules in the majority of cases are benign but the probability of these becoming malignant cannot be undermined.  Early identification of malignant nodules could lead to early diagnosis and increased survival.   We discussed the probability of pulmonary nodules becoming malignant increase with age, pack years of tobacco use, size/characteristics of the nodule and location; with upper lobe involvement being most worrisome.   We discussed the goal of our clinic is to thoroughly evaluate each nodule, developed a comprehensive, individualized plan of care utilizing the most advanced technology and significantly reduce the time from detection to treatment.  A dedicated pulmonary nodule clinic has proven to indeed expedite the detection and treatment of lung cancer.   Patient education in fact sheet provided along with most recent CT scans.  Plan- Review most recent CT chest. Reviewed family and personal history of cancer. Discussed occupational exposure. Discussed past medical and social history. Refer to genetics given strong maternal family cancer.  Refer to pulmonology for persistent wheezing and shortness of breath. Refer to low-dose CT screening program.  Disposition- No follow-up needed in our clinic.   Visit Diagnosis 1. Lung nodule   2. Personal history of tobacco use, presenting hazards to health     Patient expressed understanding and was in agreement with this plan. She also understands that She can call clinic at  any time with any questions, concerns, or complaints.   Greater than 50% was spent in counseling and coordination of care with this patient including but not limited to discussion of the relevant topics above (See A&P) including, but not limited to diagnosis and management of acute and chronic medical conditions.   Thank you for allowing me to participate in the care of this very pleasant patient.    Jacquelin Hawking, NP [place] [facility] Cell - [phone] Pager- [phone] 11/16/2019 11:47 AM   CC: Dr. Ancil Boozer

## 2020-01-18 NOTE — Progress Notes (Signed)
Name: Nichole Wells   MRN: [id number]    DOB: [birth year]   Date:01/19/2020       Progress Note  Subjective  Chief Complaint  Follow Up  HPI  Emphysema with COPD flare : she stopped using Anoro, last rx was for Trelegy but she has been out of medication.  CT scan chest reviewed. She has some sob, daily cough also has wheezing but she has been out of Trelegy  . Shehas a history of smoking1 pack daily for over 30 years  Inflammatory arthritis:she missed follow up with Dr. Jefm Bryant , she has been off Methotrexate for over 12  months now, she has stiffness and pain on both hands . Explained importance of follow up with him , we will place a referral.   HTN: taking medicationand bp is at goal, no side effects. She denies chest pain, has dizziness when she stands up quickly, discussed importance of staying hydrated . She has a history of MVP but has not seen a cardiologist in many years    Hyperlipidemia/Aorta atherosclerosis: she is now on Crestor and aspirin daily now.  Atherosclerosis found on CT chest. Last LDL was at goal at 65, continue Crestor   Major Depression: she was diagnosed in her 98's, she has been taking Prozac for many years, tried other medication in the past. Initially seen by psychiatrist.Still in remission and doing well. She does not want to stop medication because she had severe episode that required hospitalization in her 24's for suicide thoughts.  She states she knows how to cope with stress and fatigue   Hyperglycemia: she denies polyphagia, polydipsia or polyuria.last A1C was normal , last A1C was normal at 5.4 %  Low back pain with sciatica:She has a history of herniated disc and DDD, never had surgery but had injections in the past. She saw me last in August and was having a back pain flare - she had worked 3 weeks without a break. We referred her to [facility], X-ray showed spondylosis lumbar spine. She was given medrol dose pack and methocarbamol. She  states pain improved - back to her baseline. However over the past 2 months she has noticed a lump that rolls under skin on her left lower back and pain is usually in the 7/10 worse on left lower back. She has been taking Alevel daily and using heating pad and sometimes ice . Advised her to go back to Ortho    Patient Active Problem List   Diagnosis Date Noted  . Recurrent major depressive disorder, in remission ([place]) 02/18/2018  . Thoracic aorta atherosclerosis ([place]) 11/10/2017  . Centrilobular emphysema ([place]) 11/10/2017  . Abnormal liver CT 11/10/2017  . Inflammatory arthritis 05/07/2017  . Current tobacco use 05/07/2017  . Vitamin D deficiency 05/28/2016  . Surgical menopause on hormone replacement therapy 04/23/2016  . Status post laparoscopic assisted vaginal hysterectomy (LAVH) 01/28/2016  . Family history of ovarian cancer 11/13/2015  . Insomnia 03/27/2015  . Hypertension 08/30/2014  . Anxiety and depression 08/30/2014  . Cardiac murmur 08/30/2014  . HLD (hyperlipidemia) 08/30/2014  . DDD (degenerative disc disease), lumbar 07/26/2014    Past Surgical History:  Procedure Laterality Date  . ABDOMINAL HYSTERECTOMY    . BREAST BIOPSY Left    stereo- neg  . BREAST CYST ASPIRATION Right    neg  . BREAST SURGERY     benign biopsy  . CHOLECYSTECTOMY    . COLONOSCOPY WITH PROPOFOL N/A 10/11/2019   Procedure: COLONOSCOPY WITH PROPOFOL;  Surgeon: [name], MD;  Location: [facility] ENDOSCOPY;  Service: Gastroenterology;  Laterality: N/A;  . DILATION AND CURETTAGE OF UTERUS    . LAPAROSCOPIC LYSIS OF ADHESIONS  12/10/2015   Procedure: LAPAROSCOPIC LYSIS OF ADHESIONS;  Surgeon: Brayton Mars, MD;  Location: [facility] ORS;  Service: Gynecology;;  . LAPAROSCOPIC VAGINAL HYSTERECTOMY WITH SALPINGO OOPHORECTOMY Bilateral 01/28/2016   Procedure: LAPAROSCOPIC ASSISTED VAGINAL HYSTERECTOMY WITH SALPINGO OOPHORECTOMY;  Surgeon: Brayton Mars, MD;  Location: [facility] ORS;  Service:  Gynecology;  Laterality: Bilateral;  . LAPAROSCOPY N/A 12/10/2015   Procedure: LAPAROSCOPY DIAGNOSTIC WITH BIOPSIES;  Surgeon: Brayton Mars, MD;  Location: [facility] ORS;  Service: Gynecology;  Laterality: N/A;  . SHOULDER ARTHROSCOPY Right   . TUBAL LIGATION    . UTERINE FIBROID EMBOLIZATION      Family History  Problem Relation Age of Onset  . Cancer Mother        cervical, lung  . Lung cancer Mother   . Heart disease Father   . Hypertension Father   . COPD Father   . Breast cancer Maternal Aunt   . Ovarian cancer Maternal Aunt   . Spina bifida Sister   . Diabetes Son   . Diabetes Maternal Grandmother   . Heart disease Maternal Grandmother   . Parkinson's disease Paternal Grandfather     Social History   Tobacco Use  . Smoking status: Current Every Day Smoker    Packs/day: 1.00    Years: 37.00    Pack years: 37.00    Types: Cigarettes    Start date: 10/06/1987  . Smokeless tobacco: Never Used  Substance Use Topics  . Alcohol use: Yes    Alcohol/week: 0.0 standard drinks    Comment: none last 24hr     Current Outpatient Medications:  .  aspirin 81 MG EC tablet, TAKE 1 TABLET BY MOUTH EVERY DAY, Disp: 30 tablet, Rfl: 5 .  cetirizine (ZYRTEC) 10 MG tablet, Take 10 mg by mouth daily., Disp: , Rfl:  .  fluticasone (FLONASE) 50 MCG/ACT nasal spray, SPRAY 2 SPRAYS INTO EACH NOSTRIL EVERY DAY, Disp: 48 mL, Rfl: 3 .  gabapentin (NEURONTIN) 100 MG capsule, Take 1 capsule (100 mg total) by mouth at bedtime., Disp: 90 capsule, Rfl: 1 .  Vitamin D, Cholecalciferol, 50 MCG (2000 UT) CAPS, Take by mouth daily., Disp: , Rfl:  .  amLODipine (NORVASC) 10 MG tablet, Take 1 tablet (10 mg total) by mouth daily., Disp: 90 tablet, Rfl: 1 .  FLUoxetine (PROZAC) 40 MG capsule, Take 1 capsule (40 mg total) by mouth daily., Disp: 90 capsule, Rfl: 1 .  Fluticasone-Umeclidin-Vilant (TRELEGY ELLIPTA) 100-62.5-25 MCG/INH AEPB, Inhale 1 puff into the lungs daily., Disp: 60 each, Rfl: 5 .   hydrochlorothiazide (HYDRODIURIL) 12.5 MG tablet, Take 1 tablet (12.5 mg total) by mouth daily., Disp: 90 tablet, Rfl: 1 .  metaxalone (SKELAXIN) 800 MG tablet, Take 1 tablet (800 mg total) by mouth 3 (three) times daily., Disp: 90 tablet, Rfl: 0 .  metoprolol succinate (TOPROL-XL) 50 MG 24 hr tablet, Take 1 tablet (50 mg total) by mouth daily. Take with or immediately following a meal., Disp: 90 tablet, Rfl: 1 .  rosuvastatin (CRESTOR) 40 MG tablet, Take 1 tablet (40 mg total) by mouth daily., Disp: 90 tablet, Rfl: 1  Allergies  Allergen Reactions  . Hydroxychloroquine Itching    Pt take zyrtec to alleviate symptoms   . Penicillin G Hives    Has patient had a PCN reaction causing immediate rash,  facial/tongue/throat swelling, SOB or lightheadedness with hypotension:unsure Has patient had a PCN reaction causing severe rash involving mucus membranes or skin necrosis:unsure Has patient had a PCN reaction that required hospitalization:unsure Has patient had a PCN reaction occurring within the last 10 years:No If all of the above answers are "NO", then may proceed with Cephalosporin use.     I personally reviewed active problem list, medication list, allergies, family history, social history, health maintenance, notes from last encounter with the patient/caregiver today.   ROS  Constitutional: Negative for fever or weight change.  Respiratory: Negative for cough and shortness of breath.   Cardiovascular: Negative for chest pain or palpitations.  Gastrointestinal: Negative for abdominal pain, no bowel changes.  Musculoskeletal: Positive  for gait problem and  joint swelling.  Skin: Negative for rash.  Neurological: Negative for dizziness or headache.  No other specific complaints in a complete review of systems (except as listed in HPI above).  Objective  Vitals:   01/19/20 0837  BP: 122/82  Pulse: 78  Resp: 16  Temp: 98 F (36.7 C)  TempSrc: Oral  SpO2: 98%  Weight: 181 lb  (82.1 kg)  Height: 5\' 2"  (1.575 m)    Body mass index is 33.11 kg/m.  Physical Exam  Constitutional: Patient appears well-developed and well-nourished. Obese  No distress.  HEENT: head atraumatic, normocephalic, pupils equal and reactive to light,  neck supple Cardiovascular: Normal rate, regular rhythm and normal heart sounds.  No murmur heard. No BLE edema. Pulmonary/Chest: Effort normal and breath sounds normal. No respiratory distress. Abdominal: Soft.  There is no tenderness. Muscular Skeletal: tender round mass, that is soft and rolls under the skin on left lower back, erythema ab igne due to heating pads  Psychiatric: Patient has a normal mood and affect. behavior is normal. Judgment and thought content normal.  PHQ2/9: Depression screen [facility] 2/9 01/19/2020 01/19/2020 07/20/2019 01/18/2019 08/19/2018  Decreased Interest 0 0 0 0 0  Down, Depressed, Hopeless 0 0 0 0 0  PHQ - 2 Score 0 0 0 0 0  Altered sleeping 0 - 0 0 0  Tired, decreased energy 1 - 0 0 0  Change in appetite 0 - 0 0 0  Feeling bad or failure about yourself  0 - 0 0 0  Trouble concentrating 0 - 0 0 0  Moving slowly or fidgety/restless 0 - 0 0 0  Suicidal thoughts 0 - 0 0 0  PHQ-9 Score 1 - 0 0 0  Difficult doing work/chores Somewhat difficult - - Not difficult at all -    phq 9 is negative   Fall Risk: Fall Risk  01/19/2020 07/20/2019 01/18/2019 08/19/2018 02/18/2018  Falls in the past year? 0 0 0 0 0  Number falls in past yr: 0 0 0 0 0  Injury with Fall? 0 0 0 0 0     Functional Status Survey: Is the patient deaf or have difficulty hearing?: No Does the patient have difficulty seeing, even when wearing glasses/contacts?: No Does the patient have difficulty concentrating, remembering, or making decisions?: No Does the patient have difficulty walking or climbing stairs?: Yes Does the patient have difficulty dressing or bathing?: No Does the patient have difficulty doing errands alone such as visiting a doctor's  office or shopping?: No    Assessment & Plan  1. Essential hypertension  - amLODipine (NORVASC) 10 MG tablet; Take 1 tablet (10 mg total) by mouth daily.  Dispense: 90 tablet; Refill: 1 - hydrochlorothiazide (HYDRODIURIL) 12.5  MG tablet; Take 1 tablet (12.5 mg total) by mouth daily.  Dispense: 90 tablet; Refill: 1 - metoprolol succinate (TOPROL-XL) 50 MG 24 hr tablet; Take 1 tablet (50 mg total) by mouth daily. Take with or immediately following a meal.  Dispense: 90 tablet; Refill: 1  2. Pure hypercholesterolemia  - rosuvastatin (CRESTOR) 40 MG tablet; Take 1 tablet (40 mg total) by mouth daily.  Dispense: 90 tablet; Refill: 1  3. DDD (degenerative disc disease), lumbar  - metaxalone (SKELAXIN) 800 MG tablet; Take 1 tablet (800 mg total) by mouth 3 (three) times daily.  Dispense: 90 tablet; Refill: 0  4. Vitamin D deficiency   5. Thoracic aorta atherosclerosis (HCC)  - rosuvastatin (CRESTOR) 40 MG tablet; Take 1 tablet (40 mg total) by mouth daily.  Dispense: 90 tablet; Refill: 1  6. Tobacco use  She wants to quit, she will try nicotine patches or gum   7. Recurrent major depressive disorder, in remission (HCC)  - FLUoxetine (PROZAC) 40 MG capsule; Take 1 capsule (40 mg total) by mouth daily.  Dispense: 90 capsule; Refill: 1  8. Inflammatory arthritis  - Ambulatory referral to Rheumatology  9. Centrilobular emphysema ([place])  - Fluticasone-Umeclidin-Vilant (TRELEGY ELLIPTA) 100-62.5-25 MCG/INH AEPB; Inhale 1 puff into the lungs daily.  Dispense: 60 each; Refill: 5  10. Chronic left-sided low back pain without sciatica  - metaxalone (SKELAXIN) 800 MG tablet; Take 1 tablet (800 mg total) by mouth 3 (three) times daily.  Dispense: 90 tablet; Refill: 0  11. Leukocytosis, unspecified type  - CBC with Differential/Platelet  12. Need for shingles vaccine  - Varicella-zoster vaccine IM  13. Erythema ab igne

## 2020-01-19 ENCOUNTER — Encounter: Payer: Self-pay | Admitting: Family Medicine

## 2020-01-19 ENCOUNTER — Ambulatory Visit: Payer: No Typology Code available for payment source | Admitting: Family Medicine

## 2020-01-19 ENCOUNTER — Other Ambulatory Visit: Payer: Self-pay

## 2020-01-19 VITALS — BP 122/82 | HR 78 | Temp 98.0°F | Resp 16 | Ht 62.0 in | Wt 181.0 lb

## 2020-01-19 DIAGNOSIS — M1990 Unspecified osteoarthritis, unspecified site: Secondary | ICD-10-CM

## 2020-01-19 DIAGNOSIS — Z23 Encounter for immunization: Secondary | ICD-10-CM | POA: Diagnosis not present

## 2020-01-19 DIAGNOSIS — F3340 Major depressive disorder, recurrent, in remission, unspecified: Secondary | ICD-10-CM

## 2020-01-19 DIAGNOSIS — I700 Atherosclerosis of aorta: Secondary | ICD-10-CM

## 2020-01-19 DIAGNOSIS — E7800 Pure hypercholesterolemia, unspecified: Secondary | ICD-10-CM

## 2020-01-19 DIAGNOSIS — D72829 Elevated white blood cell count, unspecified: Secondary | ICD-10-CM

## 2020-01-19 DIAGNOSIS — G8929 Other chronic pain: Secondary | ICD-10-CM

## 2020-01-19 DIAGNOSIS — J432 Centrilobular emphysema: Secondary | ICD-10-CM

## 2020-01-19 DIAGNOSIS — Z720 Tobacco use: Secondary | ICD-10-CM

## 2020-01-19 DIAGNOSIS — M5136 Other intervertebral disc degeneration, lumbar region: Secondary | ICD-10-CM

## 2020-01-19 DIAGNOSIS — L590 Erythema ab igne [dermatitis ab igne]: Secondary | ICD-10-CM

## 2020-01-19 DIAGNOSIS — I10 Essential (primary) hypertension: Secondary | ICD-10-CM

## 2020-01-19 DIAGNOSIS — M545 Low back pain, unspecified: Secondary | ICD-10-CM

## 2020-01-19 DIAGNOSIS — E559 Vitamin D deficiency, unspecified: Secondary | ICD-10-CM

## 2020-01-19 LAB — CBC WITH DIFFERENTIAL/PLATELET
Absolute Monocytes: 714 cells/uL (ref 200–950)
Basophils Absolute: 68 cells/uL (ref 0–200)
Basophils Relative: 0.9 %
Eosinophils Absolute: 190 cells/uL (ref 15–500)
Eosinophils Relative: 2.5 %
HCT: 42.7 % (ref 35.0–45.0)
Hemoglobin: 14.5 g/dL (ref 11.7–15.5)
Lymphs Abs: 2006 cells/uL (ref 850–3900)
MCH: 30.8 pg (ref 27.0–33.0)
MCHC: 34 g/dL (ref 32.0–36.0)
MCV: 90.7 fL (ref 80.0–100.0)
MPV: 9.7 fL (ref 7.5–12.5)
Monocytes Relative: 9.4 %
Neutro Abs: 4621 cells/uL (ref 1500–7800)
Neutrophils Relative %: 60.8 %
Platelets: 353 10*3/uL (ref 140–400)
RBC: 4.71 10*6/uL (ref 3.80–5.10)
RDW: 12.7 % (ref 11.0–15.0)
Total Lymphocyte: 26.4 %
WBC: 7.6 10*3/uL (ref 3.8–10.8)

## 2020-01-19 MED ORDER — AMLODIPINE BESYLATE 10 MG PO TABS: 10.0000 mg | ORAL_TABLET | Freq: Every day | ORAL | 1 refills | Status: DC

## 2020-01-19 MED ORDER — ROSUVASTATIN CALCIUM 40 MG PO TABS: 40.0000 mg | ORAL_TABLET | Freq: Every day | ORAL | 1 refills | Status: DC

## 2020-01-19 MED ORDER — HYDROCHLOROTHIAZIDE 12.5 MG PO TABS: 12.5000 mg | ORAL_TABLET | Freq: Every day | ORAL | 1 refills | Status: DC

## 2020-01-19 MED ORDER — TRELEGY ELLIPTA 100-62.5-25 MCG/INH IN AEPB: 1.0000 | INHALATION_SPRAY | Freq: Every day | RESPIRATORY_TRACT | 5 refills | Status: DC

## 2020-01-19 MED ORDER — FLUOXETINE HCL 40 MG PO CAPS: 40.0000 mg | ORAL_CAPSULE | Freq: Every day | ORAL | 1 refills | Status: DC

## 2020-01-19 MED ORDER — METAXALONE 800 MG PO TABS: 800.0000 mg | ORAL_TABLET | Freq: Three times a day (TID) | ORAL | 0 refills | Status: DC

## 2020-01-19 MED ORDER — METOPROLOL SUCCINATE ER 50 MG PO TB24: 50.0000 mg | ORAL_TABLET | Freq: Every day | ORAL | 1 refills | Status: DC

## 2020-01-19 NOTE — Patient Instructions (Signed)
[facility] [phone] 

## 2020-01-23 ENCOUNTER — Encounter: Payer: Self-pay | Admitting: Family Medicine

## 2020-06-14 DIAGNOSIS — J329 Chronic sinusitis, unspecified: Secondary | ICD-10-CM | POA: Diagnosis not present

## 2020-07-12 ENCOUNTER — Other Ambulatory Visit: Payer: Self-pay

## 2020-07-12 ENCOUNTER — Ambulatory Visit (INDEPENDENT_AMBULATORY_CARE_PROVIDER_SITE_OTHER): Payer: 59 | Admitting: Family Medicine

## 2020-07-12 ENCOUNTER — Other Ambulatory Visit: Payer: Self-pay | Admitting: Family Medicine

## 2020-07-12 ENCOUNTER — Encounter: Payer: Self-pay | Admitting: Family Medicine

## 2020-07-12 VITALS — BP 136/82 | HR 79 | Temp 98.7°F | Resp 16 | Ht 62.0 in | Wt 177.0 lb

## 2020-07-12 DIAGNOSIS — I10 Essential (primary) hypertension: Secondary | ICD-10-CM | POA: Diagnosis not present

## 2020-07-12 DIAGNOSIS — J432 Centrilobular emphysema: Secondary | ICD-10-CM

## 2020-07-12 DIAGNOSIS — E7800 Pure hypercholesterolemia, unspecified: Secondary | ICD-10-CM

## 2020-07-12 DIAGNOSIS — G8929 Other chronic pain: Secondary | ICD-10-CM

## 2020-07-12 DIAGNOSIS — R739 Hyperglycemia, unspecified: Secondary | ICD-10-CM

## 2020-07-12 DIAGNOSIS — F3340 Major depressive disorder, recurrent, in remission, unspecified: Secondary | ICD-10-CM | POA: Diagnosis not present

## 2020-07-12 DIAGNOSIS — M5136 Other intervertebral disc degeneration, lumbar region: Secondary | ICD-10-CM

## 2020-07-12 DIAGNOSIS — M1990 Unspecified osteoarthritis, unspecified site: Secondary | ICD-10-CM

## 2020-07-12 DIAGNOSIS — I700 Atherosclerosis of aorta: Secondary | ICD-10-CM

## 2020-07-12 DIAGNOSIS — M545 Low back pain, unspecified: Secondary | ICD-10-CM

## 2020-07-12 DIAGNOSIS — E559 Vitamin D deficiency, unspecified: Secondary | ICD-10-CM

## 2020-07-12 MED ORDER — GABAPENTIN 100 MG PO CAPS: 100.0000 mg | ORAL_CAPSULE | Freq: Every day | ORAL | 1 refills | Status: DC

## 2020-07-12 MED ORDER — HYDROCHLOROTHIAZIDE 12.5 MG PO TABS: 12.5000 mg | ORAL_TABLET | Freq: Every day | ORAL | 1 refills | Status: DC

## 2020-07-12 MED ORDER — ROSUVASTATIN CALCIUM 40 MG PO TABS: 40.0000 mg | ORAL_TABLET | Freq: Every day | ORAL | 1 refills | Status: DC

## 2020-07-12 MED ORDER — AMLODIPINE BESYLATE 10 MG PO TABS: 10.0000 mg | ORAL_TABLET | Freq: Every day | ORAL | 1 refills | Status: DC

## 2020-07-12 MED ORDER — FLUOXETINE HCL 40 MG PO CAPS: 40.0000 mg | ORAL_CAPSULE | Freq: Every day | ORAL | 1 refills | Status: DC

## 2020-07-12 MED ORDER — METOPROLOL SUCCINATE ER 50 MG PO TB24: 50.0000 mg | ORAL_TABLET | Freq: Every day | ORAL | 1 refills | Status: DC

## 2020-07-12 MED ORDER — TRELEGY ELLIPTA 100-62.5-25 MCG/INH IN AEPB: 1.0000 | INHALATION_SPRAY | Freq: Every day | RESPIRATORY_TRACT | 5 refills | Status: DC

## 2020-07-12 NOTE — Progress Notes (Signed)
Name: Nichole Wells   MRN: [id number]    DOB: 1960-09-14   Date:07/12/2020       Progress Note  Subjective  Chief Complaint  Follow Up  HPI  Emphysema with COPD flare :  CT scan chest reviewed. She has some sob, daily cough also has wheezing, she has been out of Trelegy and would like to resume medicaiton  . Shehas a history of smoking1 pack daily for over 30 years  Inflammatory arthritis:she missed follow up with Dr. Jefm Bryant , she has been off Methotrexate for over 18 months  she has stiffness and pain on both hands . She still has Raynaud's , she had a gap in insurance and stopped taking medications again. Explained medication should be at the pharmacy and needs to contact him back   HTN: taking medicationand bp is at goal, no side effects. She denies chest pain or palpitation, she has some dizziness when she stands quickly and not drinking enough water  . She has a history of MVP but has not seen a cardiologist in many years    Hyperlipidemia/Aorta atherosclerosis: she is now on Crestor and aspirin daily now.  Atherosclerosis found on CT chest. Last LDL was at goal at 65, we will recheck labs today   Major Depression: she was diagnosed in her 34's, she has been taking Prozac for many years, tried other medication in the past. Initially seen by psychiatrist and had therapy in the past .Still in remission and doing well. She does not want to stop medication because she had severe episode that required hospitalization in her 75's for suicide thoughts. She is doing well   Hyperglycemia: she denies polyphagia, polydipsia or polyuria.last A1C was normal , it was as high as 5.8 % in the past.    Low back pain with sciatica:She has a history of herniated disc and DDD, never had surgery but had injections in the past. She saw me last in August and was having a back pain flare - she had worked 3 weeks without a break. We referred her to [facility], X-ray showed spondylosis lumbar spine.  She was given medrol dose pack and methocarbamol. She states pain improved temporarily . She continues to have daily pain in the mid lower back no radiation, she did not like going to [facility], she would like to go back for steroid injections  Multiple polyps: she missed her colonoscopy because of gap in insurance but will contact them back to re-schedule it    Patient Active Problem List   Diagnosis Date Noted  . Recurrent major depressive disorder, in remission ([place]) 02/18/2018  . Thoracic aorta atherosclerosis ([place]) 11/10/2017  . Centrilobular emphysema ([place]) 11/10/2017  . Abnormal liver CT 11/10/2017  . Inflammatory arthritis 05/07/2017  . Current tobacco use 05/07/2017  . Vitamin D deficiency 05/28/2016  . Surgical menopause on hormone replacement therapy 04/23/2016  . Status post laparoscopic assisted vaginal hysterectomy (LAVH) 01/28/2016  . Family history of ovarian cancer 11/13/2015  . Insomnia 03/27/2015  . Hypertension 08/30/2014  . Anxiety and depression 08/30/2014  . Cardiac murmur 08/30/2014  . HLD (hyperlipidemia) 08/30/2014  . DDD (degenerative disc disease), lumbar 07/26/2014    Past Surgical History:  Procedure Laterality Date  . ABDOMINAL HYSTERECTOMY    . BREAST BIOPSY Left    stereo- neg  . BREAST CYST ASPIRATION Right    neg  . BREAST SURGERY     benign biopsy  . CHOLECYSTECTOMY    . COLONOSCOPY WITH PROPOFOL  N/A 10/11/2019   Procedure: COLONOSCOPY WITH PROPOFOL;  Surgeon: [name], MD;  Location: [facility] ENDOSCOPY;  Service: Gastroenterology;  Laterality: N/A;  . DILATION AND CURETTAGE OF UTERUS    . LAPAROSCOPIC LYSIS OF ADHESIONS  12/10/2015   Procedure: LAPAROSCOPIC LYSIS OF ADHESIONS;  Surgeon: Brayton Mars, MD;  Location: [facility] ORS;  Service: Gynecology;;  . LAPAROSCOPIC VAGINAL HYSTERECTOMY WITH SALPINGO OOPHORECTOMY Bilateral 01/28/2016   Procedure: LAPAROSCOPIC ASSISTED VAGINAL HYSTERECTOMY WITH SALPINGO OOPHORECTOMY;  Surgeon: Brayton Mars, MD;  Location: [facility] ORS;  Service: Gynecology;  Laterality: Bilateral;  . LAPAROSCOPY N/A 12/10/2015   Procedure: LAPAROSCOPY DIAGNOSTIC WITH BIOPSIES;  Surgeon: Brayton Mars, MD;  Location: [facility] ORS;  Service: Gynecology;  Laterality: N/A;  . SHOULDER ARTHROSCOPY Right   . TUBAL LIGATION    . UTERINE FIBROID EMBOLIZATION      Family History  Problem Relation Age of Onset  . Cancer Mother        cervical, lung  . Lung cancer Mother   . Heart disease Father   . Hypertension Father   . COPD Father   . Breast cancer Maternal Aunt   . Ovarian cancer Maternal Aunt   . Spina bifida Sister   . Diabetes Son   . Diabetes Maternal Grandmother   . Heart disease Maternal Grandmother   . Parkinson's disease Paternal Grandfather     Social History   Tobacco Use  . Smoking status: Current Every Day Smoker    Packs/day: 1.00    Years: 37.00    Pack years: 37.00    Types: Cigarettes    Start date: 10/06/1987  . Smokeless tobacco: Never Used  Substance Use Topics  . Alcohol use: Yes    Alcohol/week: 0.0 standard drinks    Comment: none last 24hr     Current Outpatient Medications:  .  amLODipine (NORVASC) 10 MG tablet, Take 1 tablet (10 mg total) by mouth daily., Disp: 90 tablet, Rfl: 1 .  aspirin 81 MG EC tablet, TAKE 1 TABLET BY MOUTH EVERY DAY, Disp: 30 tablet, Rfl: 5 .  cetirizine (ZYRTEC) 10 MG tablet, Take 10 mg by mouth daily., Disp: , Rfl:  .  FLUoxetine (PROZAC) 40 MG capsule, Take 1 capsule (40 mg total) by mouth daily., Disp: 90 capsule, Rfl: 1 .  fluticasone (FLONASE) 50 MCG/ACT nasal spray, SPRAY 2 SPRAYS INTO EACH NOSTRIL EVERY DAY, Disp: 48 mL, Rfl: 3 .  Fluticasone-Umeclidin-Vilant (TRELEGY ELLIPTA) 100-62.5-25 MCG/INH AEPB, Inhale 1 puff into the lungs daily., Disp: 60 each, Rfl: 5 .  gabapentin (NEURONTIN) 100 MG capsule, Take 1 capsule (100 mg total) by mouth at bedtime., Disp: 90 capsule, Rfl: 1 .  hydrochlorothiazide (HYDRODIURIL) 12.5 MG  tablet, Take 1 tablet (12.5 mg total) by mouth daily., Disp: 90 tablet, Rfl: 1 .  metaxalone (SKELAXIN) 800 MG tablet, Take 1 tablet (800 mg total) by mouth 3 (three) times daily., Disp: 90 tablet, Rfl: 0 .  metoprolol succinate (TOPROL-XL) 50 MG 24 hr tablet, Take 1 tablet (50 mg total) by mouth daily. Take with or immediately following a meal., Disp: 90 tablet, Rfl: 1 .  rosuvastatin (CRESTOR) 40 MG tablet, Take 1 tablet (40 mg total) by mouth daily., Disp: 90 tablet, Rfl: 1 .  Vitamin D, Cholecalciferol, 50 MCG (2000 UT) CAPS, Take by mouth daily. (Patient not taking: Reported on 07/12/2020), Disp: , Rfl:   Allergies  Allergen Reactions  . Hydroxychloroquine Itching    Pt take zyrtec to alleviate symptoms   .  Penicillin G Hives    Has patient had a PCN reaction causing immediate rash, facial/tongue/throat swelling, SOB or lightheadedness with hypotension:unsure Has patient had a PCN reaction causing severe rash involving mucus membranes or skin necrosis:unsure Has patient had a PCN reaction that required hospitalization:unsure Has patient had a PCN reaction occurring within the last 10 years:No If all of the above answers are "NO", then may proceed with Cephalosporin use.     I personally reviewed active problem list, medication list, allergies, family history, social history, health maintenance with the patient/caregiver today.   ROS  Constitutional: Negative for fever or weight change.  Respiratory: positive for cough and shortness of breath.   Cardiovascular: Negative for chest pain or palpitations.  Gastrointestinal: Negative for abdominal pain, no bowel changes.  Musculoskeletal: Negative for gait problem or joint swelling.  Skin: Negative for rash.  Neurological: positive  for dizziness ( occasionally) but no  headache.  No other specific complaints in a complete review of systems (except as listed in HPI above).  Objective  Vitals:   07/12/20 0941  BP: 136/82  Pulse: 79   Resp: 16  Temp: 98.7 F (37.1 C)  TempSrc: Oral  SpO2: 97%  Weight: 177 lb (80.3 kg)  Height: 5\' 2"  (1.575 m)    Body mass index is 32.37 kg/m.  Physical Exam  Constitutional: Patient appears well-developed and well-nourished. Obese  No distress.  HEENT: head atraumatic, normocephalic, pupils equal and reactive to light,  neck supple Cardiovascular: Normal rate, regular rhythm and normal heart sounds.  No murmur heard. No BLE edema. Pulmonary/Chest: Effort normal and breath sounds normal. No respiratory distress. Abdominal: Soft.  There is no tenderness. Psychiatric: Patient has a normal mood and affect. behavior is normal. Judgment and thought content normal.  PHQ2/9: Depression screen [facility] 2/9 07/12/2020 01/19/2020 01/19/2020 07/20/2019 01/18/2019  Decreased Interest 0 0 0 0 0  Down, Depressed, Hopeless 0 0 0 0 0  PHQ - 2 Score 0 0 0 0 0  Altered sleeping 0 0 - 0 0  Tired, decreased energy 0 1 - 0 0  Change in appetite 0 0 - 0 0  Feeling bad or failure about yourself  0 0 - 0 0  Trouble concentrating 0 0 - 0 0  Moving slowly or fidgety/restless 0 0 - 0 0  Suicidal thoughts 0 0 - 0 0  PHQ-9 Score 0 1 - 0 0  Difficult doing work/chores - Somewhat difficult - - Not difficult at all  Some recent data might be hidden    phq 9 is negative   Fall Risk: Fall Risk  07/12/2020 01/19/2020 07/20/2019 01/18/2019 08/19/2018  Falls in the past year? 0 0 0 0 0  Number falls in past yr: 0 0 0 0 0  Injury with Fall? 0 0 0 0 0    Functional Status Survey: Is the patient deaf or have difficulty hearing?: No Does the patient have difficulty seeing, even when wearing glasses/contacts?: No Does the patient have difficulty concentrating, remembering, or making decisions?: No Does the patient have difficulty walking or climbing stairs?: No Does the patient have difficulty dressing or bathing?: No Does the patient have difficulty doing errands alone such as visiting a doctor's office or shopping?:  No    Assessment & Plan  1. Essential hypertension  - amLODipine (NORVASC) 10 MG tablet; Take 1 tablet (10 mg total) by mouth daily.  Dispense: 90 tablet; Refill: 1 - hydrochlorothiazide (HYDRODIURIL) 12.5 MG tablet; Take 1 tablet (  12.5 mg total) by mouth daily.  Dispense: 90 tablet; Refill: 1 - metoprolol succinate (TOPROL-XL) 50 MG 24 hr tablet; Take 1 tablet (50 mg total) by mouth daily. Take with or immediately following a meal.  Dispense: 90 tablet; Refill: 1 - COMPLETE METABOLIC PANEL WITH GFR - CBC with Differential/Platelet  2. Recurrent major depressive disorder, in remission (HCC)  - FLUoxetine (PROZAC) 40 MG capsule; Take 1 capsule (40 mg total) by mouth daily.  Dispense: 90 capsule; Refill: 1  3. Centrilobular emphysema ([place])  - Fluticasone-Umeclidin-Vilant (TRELEGY ELLIPTA) 100-62.5-25 MCG/INH AEPB; Inhale 1 puff into the lungs daily.  Dispense: 60 each; Refill: 5  4. DDD (degenerative disc disease), lumbar  - gabapentin (NEURONTIN) 100 MG capsule; Take 1 capsule (100 mg total) by mouth at bedtime.  Dispense: 90 capsule; Refill: 1 - Ambulatory referral to Pain Clinic  5. Chronic left-sided low back pain without sciatica  - gabapentin (NEURONTIN) 100 MG capsule; Take 1 capsule (100 mg total) by mouth at bedtime.  Dispense: 90 capsule; Refill: 1 - Ambulatory referral to Pain Clinic  6. Pure hypercholesterolemia  - rosuvastatin (CRESTOR) 40 MG tablet; Take 1 tablet (40 mg total) by mouth daily.  Dispense: 90 tablet; Refill: 1 - Lipid panel  7. Thoracic aorta atherosclerosis (HCC)  - rosuvastatin (CRESTOR) 40 MG tablet; Take 1 tablet (40 mg total) by mouth daily.  Dispense: 90 tablet; Refill: 1 - Lipid panel  8. Hyperglycemia  - Hemoglobin A1c  9. Vitamin D deficiency   10. Inflammatory arthritis

## 2020-07-13 LAB — CBC WITH DIFFERENTIAL/PLATELET
Absolute Monocytes: 497 cells/uL (ref 200–950)
Basophils Absolute: 63 cells/uL (ref 0–200)
Basophils Relative: 0.9 %
Eosinophils Absolute: 168 cells/uL (ref 15–500)
Eosinophils Relative: 2.4 %
HCT: 47.1 % — ABNORMAL HIGH (ref 35.0–45.0)
Hemoglobin: 15.5 g/dL (ref 11.7–15.5)
Lymphs Abs: 1722 cells/uL (ref 850–3900)
MCH: 30.6 pg (ref 27.0–33.0)
MCHC: 32.9 g/dL (ref 32.0–36.0)
MCV: 92.9 fL (ref 80.0–100.0)
MPV: 9.6 fL (ref 7.5–12.5)
Monocytes Relative: 7.1 %
Neutro Abs: 4550 cells/uL (ref 1500–7800)
Neutrophils Relative %: 65 %
Platelets: 348 10*3/uL (ref 140–400)
RBC: 5.07 10*6/uL (ref 3.80–5.10)
RDW: 12.5 % (ref 11.0–15.0)
Total Lymphocyte: 24.6 %
WBC: 7 10*3/uL (ref 3.8–10.8)

## 2020-07-13 LAB — HEMOGLOBIN A1C
Hgb A1c MFr Bld: 5.5 % of total Hgb (ref <5.7)
Mean Plasma Glucose: 111 mg/dL
eAG (mmol/L): 6.2 mmol/L

## 2020-07-13 LAB — COMPLETE METABOLIC PANEL WITH GFR
AG Ratio: 1.8 (calc) (ref 1.0–2.5)
ALT: 34 U/L — ABNORMAL HIGH (ref 6–29)
AST: 25 U/L (ref 10–35)
Albumin: 4.4 g/dL (ref 3.6–5.1)
Alkaline phosphatase (APISO): 100 U/L (ref 37–153)
BUN: 12 mg/dL (ref 7–25)
CO2: 25 mmol/L (ref 20–32)
Calcium: 10 mg/dL (ref 8.6–10.4)
Chloride: 104 mmol/L (ref 98–110)
Creat: 0.51 mg/dL (ref 0.50–1.05)
GFR, Est African American: 124 mL/min/{1.73_m2} (ref >=60)
GFR, Est Non African American: 107 mL/min/{1.73_m2} (ref >=60)
Globulin: 2.4 g/dL (calc) (ref 1.9–3.7)
Glucose, Bld: 100 mg/dL — ABNORMAL HIGH (ref 65–99)
Potassium: 4.5 mmol/L (ref 3.5–5.3)
Sodium: 138 mmol/L (ref 135–146)
Total Bilirubin: 0.5 mg/dL (ref 0.2–1.2)
Total Protein: 6.8 g/dL (ref 6.1–8.1)

## 2020-07-13 LAB — LIPID PANEL
Cholesterol: 195 mg/dL (ref <200)
HDL: 53 mg/dL (ref >=50)
LDL Cholesterol (Calc): 119 mg/dL (calc) — ABNORMAL HIGH
Non-HDL Cholesterol (Calc): 142 mg/dL (calc) — ABNORMAL HIGH (ref <130)
Total CHOL/HDL Ratio: 3.7 (calc) (ref <5.0)
Triglycerides: 123 mg/dL (ref <150)

## 2020-07-13 NOTE — Telephone Encounter (Signed)
Prior auth sent

## 2020-08-08 ENCOUNTER — Other Ambulatory Visit: Payer: Self-pay | Admitting: Family Medicine

## 2020-08-08 DIAGNOSIS — F3340 Major depressive disorder, recurrent, in remission, unspecified: Secondary | ICD-10-CM

## 2020-10-11 ENCOUNTER — Other Ambulatory Visit: Payer: Self-pay | Admitting: Family Medicine

## 2020-10-11 DIAGNOSIS — I700 Atherosclerosis of aorta: Secondary | ICD-10-CM

## 2020-10-11 DIAGNOSIS — I10 Essential (primary) hypertension: Secondary | ICD-10-CM

## 2020-10-11 DIAGNOSIS — E7800 Pure hypercholesterolemia, unspecified: Secondary | ICD-10-CM

## 2020-11-01 ENCOUNTER — Other Ambulatory Visit: Payer: Self-pay | Admitting: Family Medicine

## 2020-11-01 DIAGNOSIS — J3089 Other allergic rhinitis: Secondary | ICD-10-CM

## 2020-11-14 ENCOUNTER — Other Ambulatory Visit: Payer: Self-pay

## 2020-11-14 ENCOUNTER — Ambulatory Visit: Payer: No Typology Code available for payment source | Admitting: Pain Medicine

## 2020-11-14 ENCOUNTER — Ambulatory Visit
Admission: RE | Admit: 2020-11-14 | Discharge: 2020-11-14 | Disposition: A | Discharge status: Home / Self Care | Payer: No Typology Code available for payment source | Source: Ambulatory Visit | Attending: Pain Medicine | Admitting: Pain Medicine

## 2020-11-14 ENCOUNTER — Encounter: Payer: Self-pay | Admitting: Pain Medicine

## 2020-11-14 VITALS — BP 160/71 | HR 69 | Temp 97.2°F | Resp 18 | Ht 62.0 in | Wt 179.0 lb

## 2020-11-14 DIAGNOSIS — Z9889 Other specified postprocedural states: Secondary | ICD-10-CM | POA: Insufficient documentation

## 2020-11-14 DIAGNOSIS — M545 Low back pain, unspecified: Secondary | ICD-10-CM | POA: Diagnosis not present

## 2020-11-14 DIAGNOSIS — G894 Chronic pain syndrome: Secondary | ICD-10-CM | POA: Insufficient documentation

## 2020-11-14 DIAGNOSIS — M47812 Spondylosis without myelopathy or radiculopathy, cervical region: Secondary | ICD-10-CM | POA: Insufficient documentation

## 2020-11-14 DIAGNOSIS — M25551 Pain in right hip: Secondary | ICD-10-CM | POA: Insufficient documentation

## 2020-11-14 DIAGNOSIS — M47817 Spondylosis without myelopathy or radiculopathy, lumbosacral region: Secondary | ICD-10-CM | POA: Insufficient documentation

## 2020-11-14 DIAGNOSIS — M25531 Pain in right wrist: Secondary | ICD-10-CM | POA: Insufficient documentation

## 2020-11-14 DIAGNOSIS — E559 Vitamin D deficiency, unspecified: Secondary | ICD-10-CM | POA: Insufficient documentation

## 2020-11-14 DIAGNOSIS — M542 Cervicalgia: Secondary | ICD-10-CM | POA: Insufficient documentation

## 2020-11-14 DIAGNOSIS — G8929 Other chronic pain: Secondary | ICD-10-CM

## 2020-11-14 DIAGNOSIS — M47816 Spondylosis without myelopathy or radiculopathy, lumbar region: Secondary | ICD-10-CM

## 2020-11-14 DIAGNOSIS — M899 Disorder of bone, unspecified: Secondary | ICD-10-CM | POA: Insufficient documentation

## 2020-11-14 DIAGNOSIS — Z789 Other specified health status: Secondary | ICD-10-CM | POA: Insufficient documentation

## 2020-11-14 DIAGNOSIS — M25552 Pain in left hip: Secondary | ICD-10-CM | POA: Insufficient documentation

## 2020-11-14 DIAGNOSIS — Z79899 Other long term (current) drug therapy: Secondary | ICD-10-CM | POA: Insufficient documentation

## 2020-11-14 NOTE — Progress Notes (Signed)
Patient: Nichole Wells  Service Category: E/M  Provider: Gaspar Cola, MD  DOB: 10-16-1960  DOS: 11/14/2020  Referring Provider: Steele Sizer, MD  MRN: [id number]  Setting: Ambulatory outpatient  PCP: Steele Sizer, MD  Type: New Patient  Specialty: Interventional Pain Management    Location: Office  Delivery: Face-to-face     Primary Reason(s) for Visit: Encounter for initial evaluation of one or more chronic problems (new to examiner) potentially causing chronic pain, and posing a threat to normal musculoskeletal function. (Level of risk: High) CC: Back Pain (lower) and Neck Pain (a)  HPI  [name] is a 60 y.o. year old, female patient, who comes for the first time to our practice referred by Steele Sizer, MD for our initial evaluation of her chronic pain. She has Hypertension; Anxiety and depression; DDD (degenerative disc disease), lumbar; Cardiac murmur; HLD (hyperlipidemia); Insomnia; Family history of ovarian cancer; Status post laparoscopic assisted vaginal hysterectomy (LAVH); Surgical menopause on hormone replacement therapy; Vitamin D deficiency; Inflammatory arthritis; Current tobacco use; Thoracic aorta atherosclerosis ([place]); Centrilobular emphysema ([place]); Abnormal liver CT; Recurrent major depressive disorder, in remission ([place]); Chronic pain syndrome; Pharmacologic therapy; Disorder of skeletal system; Problems influencing health status; Chronic low back pain (1ry area of Pain) (Bilateral) (L>R) w/o sciatica; Lumbar facet joint syndrome (Bilateral); Chronic wrist pain (2ry area of Pain) (Right); History of carpal tunnel surgery of wrist (Right); Chronic neck pain (3ry area of Pain) (Bilateral) (L>R); Cervicalgia; Cervical facet syndrome; and Chronic hip pain (4th area of Pain) (Bilateral) on their problem list. Today she comes in for evaluation of her Back Pain (lower) and Neck Pain (a)  Pain Assessment: Location: Lower, Left Back Radiating: occassionally will go down  back of left leg below knee Onset: More than a month ago Duration: Chronic pain Quality: Constant, Aching, Discomfort, Nagging Severity: 8 /10 (subjective, self-reported pain score)  Effect on ADL: prolonged walking, bending, kneeling, lifting Timing: Constant Modifying factors: rest, heat BP: (!) 160/71  HR: 69  Onset and Duration: Gradual Cause of pain: Unknown Severity: Getting worse, NAS-11 at its worse: 8/10, NAS-11 at its best: 5/10, NAS-11 now: 8/10, and NAS-11 on the average: 7/10 Timing: Not influenced by the time of the day Aggravating Factors: Bending, Kneeling, Lifiting, Prolonged sitting, Prolonged standing, Squatting, and Stooping  Alleviating Factors: Hot packs, Resting, and Warm showers or baths Associated Problems: Fatigue, Temperature changes, and Pain that wakes patient up Quality of Pain: Aching, Constant, and Disabling Previous Examinations or Tests: Nerve block and X-rays Previous Treatments: Steroid treatments by mouth  According to the patient the primary area of pain inside of the lower back (Bilateral) (L>R).  The patient denies any surgeries, recent x-rays, but she does admit to having had physical therapy years ago at the [facility].  She also indicated that it did not help.  In addition, the patient also indicates having had some nerve blocks done at the [facility] approximately 3 to 5 years ago which seem to have helped for a little while.  This pain started approximately 5 to 6 years ago while she was working at [employer].  She currently works as a Occupational psychologist.  Review of the medical records in "Care Everywhere" shows evidence of the patient having had fluoroscopic guidance used for 2 procedures performed by Dr. Sharlet Salina at the [facility].  1 was done on 07/07/2014 and the second 1 was done on 07/31/2014.  Procedures done by Dr. Sharlet Salina: 1.  Bilateral L5-S1 transforaminal  ESI (07/07/2014) 2.  Left L5-S1 transforaminal  ESI + right S1 TFESI (07/31/2014)   The patient's secondary area pain is that of the right wrist.  She refers having had a carpal tunnel which was diagnosed by nerve conduction test and she underwent surgery to release it approximately 5 years ago.  After the surgery she did well for a while until recently when the pain started coming back.  She refers that the surgery was done by a hand surgeon in [place], across [facility].  The patient admits having had physical therapy after the surgery which did help.  She denies any recent x-rays or any nerve blocks or joint injections after the surgery.  The patient's third area pain is that of the neck (posterior aspect) (Bilateral) (L>R).  She denies any cervical spine surgery, nerve blocks, physical therapy, or any recent x-rays.  The patient does admit to crepitus when moving the neck around but she denies any associated headaches or upper extremity symptoms.  She denies any upper extremity pain, numbness, or weakness.  The patient's fourth area pain is that of the hips (Bilateral).  The patient denies any surgeries, x-rays, or physical therapy.  She describes having had injections into the hip area more than 6 years ago by somebody at the [facility] which she thinks might have been Dr. Jefm Bryant.  She indicates that it did not help.  In addition to the above, the patient mentioned that she has been told that she has osteoarthritis in multiple joints.  Pharmacotherapy: The patient indicates currently taking gabapentin 100 mg p.o. at bedtime.  Physical exam: Today the patient was able to toe walk and heel walk without any problems or deficits.  Lumbar spine flexion demonstrated the patient to be able to reach down to about a foot from the floor and she indicated having pain both in the extension and the flexion portion of the maneuver with a extension being worse.  The pain was referred to worse the left lower back.  She also demonstrated a  positive "double stage recovery".  Lumbar spine hyperextension was positive for reproduction of her left-sided low back pain.  No lower extremity symptoms.  Hyperextension and rotation was positive bilaterally for lumbar facet arthralgia with the left side being worse than the right.  This maneuver exactly reproduced the patient's pain.  Lateral bending of the lumbar spine was positive towards the left side for ipsilateral pain in the lower back with no radicular symptoms.  Straight leg raise was negative bilaterally except for some discomfort in the lower back, primarily on the left side.  Provocative Patrick maneuver was positive bilaterally for hip joint arthralgia with some decrease range of motion, also bilaterally.  The maneuver was negative bilaterally for sacroiliac joint discomfort.  Lower extremity deep tendon reflexes were positive and equal bilaterally for the patellar tendon and the Achilles tendon.  Cervical spine range of motion seem to be relatively within normal limits with some pain to the posterior aspect of the neck with bilateral ear to shoulder and cervical rotation maneuvers, both of which triggered discomfort in the contralateral side.  She did indicate feeling decreased range of motion and a sensation that she was unable to go any further when doing ear to shoulder maneuver towards the left side.  Today I took the time to provide the patient with information regarding my pain practice. The patient was informed that my practice is divided into two sections: an interventional pain management section, as well  as a completely separate and distinct medication management section. I explained that I have procedure days for my interventional therapies, and evaluation days for follow-ups and medication management. Because of the amount of documentation required during both, they are kept separated. This means that there is the possibility that she may be scheduled for a procedure on one day, and  medication management the next. I have also informed her that because of staffing and facility limitations, I no longer take patients for medication management only. To illustrate the reasons for this, I gave the patient the example of surgeons, and how inappropriate it would be to refer a patient to his/her care, just to write for the post-surgical antibiotics on a surgery done by a different surgeon.   Because interventional pain management is my board-certified specialty, the patient was informed that joining my practice means that they are open to any and all interventional therapies. I made it clear that this does not mean that they will be forced to have any procedures done. What this means is that I believe interventional therapies to be essential part of the diagnosis and proper management of chronic pain conditions. Therefore, patients not interested in these interventional alternatives will be better served under the care of a different practitioner.  The patient was also made aware of my Comprehensive Pain Management Safety Guidelines where by joining my practice, they limit all of their nerve blocks and joint injections to those done by our practice, for as long as we are retained to manage their care.   Historic Controlled Substance Pharmacotherapy Review  PMP and historical list of controlled substances: Hydrocodone/hematopain solution (#60) (12/31/2018) Current opioid analgesics: .   None MME/day: 0 mg/day   Historical Monitoring: The patient  reports no history of drug use. List of all UDS Test(s): No results found for: MDMA, COCAINSCRNUR, [place], [place], CANNABQUANT, [place], [place] List of other Serum/Urine Drug Screening Test(s):  No results found for: AMPHSCRSER, BARBSCRSER, BENZOSCRSER, COCAINSCRSER, COCAINSCRNUR, PCPSCRSER, PCPQUANT, THCSCRSER, THCU, CANNABQUANT, OPIATESCRSER, OXYSCRSER, PROPOXSCRSER, ETH Historical Background Evaluation: [place] PMP: PDMP not reviewed this  encounter. Online review of the past 73-monthperiod conducted.             PMP NARX Score Report:  Narcotic: 020 Sedative: 010 Stimulant: 000 [place] Department of public safety, offender search: (Editor, commissioningInformation) Non-contributory Risk Assessment Profile: Aberrant behavior: None observed or detected today Risk factors for fatal opioid overdose: None identified today PMP NARX Overdose Risk Score: 110 Fatal overdose hazard ratio (HR): Calculation deferred Non-fatal overdose hazard ratio (HR): Calculation deferred Risk of opioid abuse or dependence: 0.7-3.0% with doses ? 36 MME/day and 6.1-26% with doses ? 120 MME/day. Substance use disorder (SUD) risk level: See below Personal History of Substance Abuse (SUD-Substance use disorder):  Alcohol:    Illegal Drugs:    Rx Drugs:    ORT Risk Level calculation:    ORT Scoring interpretation table:  Score <3 = Low Risk for SUD  Score between 4-7 = Moderate Risk for SUD  Score >8 = High Risk for Opioid Abuse   PHQ-2 Depression Scale:  Total score: 0  PHQ-2 Scoring interpretation table: (Score and probability of major depressive disorder)  Score 0 = No depression  Score 1 = 15.4% Probability  Score 2 = 21.1% Probability  Score 3 = 38.4% Probability  Score 4 = 45.5% Probability  Score 5 = 56.4% Probability  Score 6 = 78.6% Probability   PHQ-9 Depression Scale:  Total score: 0  PHQ-9  Scoring interpretation table:  Score 0-4 = No depression  Score 5-9 = Mild depression  Score 10-14 = Moderate depression  Score 15-19 = Moderately severe depression  Score 20-27 = Severe depression (2.4 times higher risk of SUD and 2.89 times higher risk of overuse)   Pharmacologic Plan: As per protocol, I have not taken over any controlled substance management, pending the results of ordered tests and/or consults.            Initial impression: Pending review of available data and ordered tests.  Meds   Current Outpatient Medications:    amLODipine  (NORVASC) 10 MG tablet, Take 1 tablet (10 mg total) by mouth daily., Disp: 90 tablet, Rfl: 1   aspirin 81 MG EC tablet, TAKE 1 TABLET BY MOUTH EVERY DAY, Disp: 30 tablet, Rfl: 5   cetirizine (ZYRTEC) 10 MG tablet, Take 10 mg by mouth daily., Disp: , Rfl:    FLUoxetine (PROZAC) 40 MG capsule, TAKE 1 CAPSULE BY MOUTH EVERY DAY, Disp: 90 capsule, Rfl: 1   fluticasone (FLONASE) 50 MCG/ACT nasal spray, SPRAY 2 SPRAYS INTO EACH NOSTRIL EVERY DAY, Disp: 48 mL, Rfl: 2   Fluticasone-Umeclidin-Vilant (TRELEGY ELLIPTA) 100-62.5-25 MCG/INH AEPB, Inhale 1 puff into the lungs daily., Disp: 60 each, Rfl: 5   gabapentin (NEURONTIN) 100 MG capsule, Take 1 capsule (100 mg total) by mouth at bedtime., Disp: 90 capsule, Rfl: 1   hydrochlorothiazide (HYDRODIURIL) 12.5 MG tablet, TAKE 1 TABLET BY MOUTH EVERY DAY, Disp: 90 tablet, Rfl: 0   metoprolol succinate (TOPROL-XL) 50 MG 24 hr tablet, Take 1 tablet (50 mg total) by mouth daily. Take with or immediately following a meal., Disp: 90 tablet, Rfl: 1   rosuvastatin (CRESTOR) 40 MG tablet, TAKE 1 TABLET BY MOUTH EVERY DAY, Disp: 90 tablet, Rfl: 0   Vitamin D, Cholecalciferol, 50 MCG (2000 UT) CAPS, Take by mouth daily., Disp: , Rfl:   Imaging Review  Foot Imaging: Foot-L DG Complete: Results for orders placed during the hospital encounter of 08/31/14 DG Foot Complete Left  Narrative CLINICAL DATA:  Hx no recent injury/pain x 6 months/pain mostly @ 2nd 3rd and 4th metatarsal area/previous fx x 10 yrs ago/no surgery  EXAM: LEFT FOOT - COMPLETE 3+ VIEW  COMPARISON:  None.  FINDINGS: There is no evidence of fracture or dislocation. There is no evidence of arthropathy or other focal bone abnormality. Soft tissues are unremarkable.  IMPRESSION: Negative.   Electronically Signed By: Lajean Manes M.D. On: 08/31/2014 15:02  Wrist Imaging: Wrist-R DG Complete: Results for orders placed during the hospital encounter of 01/19/18 DG Wrist Complete  Right  Narrative CLINICAL DATA:  [place] at work.  Pain.  EXAM: RIGHT WRIST - COMPLETE 3+ VIEW  COMPARISON:  RIGHT hand radiograph October 05, 2017  FINDINGS: No acute fracture deformity or dislocation. Severe scaphoid -trapezium joint space narrowing with periarticular sclerosis and marginal spurring, unchanged. No destructive bony lesions. Soft tissue planes are non suspicious.  IMPRESSION: 1. No acute fracture deformity or dislocation. 2. Severe lateral wrist osteoarthrosis.   Electronically Signed By: Elon Alas M.D. On: 01/19/2018 22:40  Hand Imaging: Hand-R DG Complete: Results for orders placed during the hospital encounter of 10/05/17 DG Hand Complete Right  Narrative CLINICAL DATA:  Pt states she was doing yard work 3 weeks ago and got stuck by a thorn. Pt has swelling, a knot and pain Where she got stuck at 5th metacarpal. shielded  EXAM: RIGHT HAND - COMPLETE 3+ VIEW  COMPARISON:  08/31/2014  FINDINGS: There are mild degenerative changes in the carpals. No acute fracture or subluxation. No radiopaque foreign body or soft tissue gas.  IMPRESSION: No evidence for acute  abnormality.   Electronically Signed By: Nolon Nations M.D. On: 10/05/2017 15:09  Hand-L DG Complete: Results for orders placed during the hospital encounter of 08/31/14 DG Hand Complete Left  Narrative CLINICAL DATA:  Pain.  No known injury.  Initial evaluation.  EXAM: LEFT HAND - COMPLETE 3+ VIEW  COMPARISON:  None.  FINDINGS: There is no evidence of fracture or dislocation. There is no evidence of arthropathy or other focal bone abnormality. Soft tissues are unremarkable.  IMPRESSION: No acute abnormality.   Electronically Signed By: Marcello Moores  Register On: 08/31/2014 14:57  Complexity Note: Imaging results reviewed. Results shared with Ms. Fundora, using [employer].                        ROS  Cardiovascular: High blood pressure and Heart  murmur Pulmonary or Respiratory: Difficulty blowing air out (Emphysema), Smoking, and Snoring  Neurological: No reported neurological signs or symptoms such as seizures, abnormal skin sensations, urinary and/or fecal incontinence, being born with an abnormal open spine and/or a tethered spinal cord Psychological-Psychiatric: Anxiousness and Depressed Gastrointestinal: No reported gastrointestinal signs or symptoms such as vomiting or evacuating blood, reflux, heartburn, alternating episodes of diarrhea and constipation, inflamed or scarred liver, or pancreas or irrregular and/or infrequent bowel movements Genitourinary: No reported renal or genitourinary signs or symptoms such as difficulty voiding or producing urine, peeing blood, non-functioning kidney, kidney stones, difficulty emptying the bladder, difficulty controlling the flow of urine, or chronic kidney disease Hematological: No reported hematological signs or symptoms such as prolonged bleeding, low or poor functioning platelets, bruising or bleeding easily, hereditary bleeding problems, low energy levels due to low hemoglobin or being anemic Endocrine: No reported endocrine signs or symptoms such as high or low blood sugar, rapid heart rate due to high thyroid levels, obesity or weight gain due to slow thyroid or thyroid disease Rheumatologic: Joint aches and or swelling due to excess weight (Osteoarthritis) and Rheumatoid arthritis Musculoskeletal: Negative for myasthenia gravis, muscular dystrophy, multiple sclerosis or malignant hyperthermia Work History: Working full time  Allergies  Ms. Penza is allergic to hydroxychloroquine and penicillin g.  Laboratory Chemistry Profile   Renal Lab Results  Component Value Date   BUN 12 07/12/2020   CREATININE 0.51 07/62/2633   BCR NOT APPLICABLE 35/45/6256   GFRAA 124 07/12/2020   GFRNONAA 107 07/12/2020   PROTEINUR 30 mg/dL 04/27/2012     Electrolytes Lab Results  Component Value  Date   NA 138 07/12/2020   K 4.5 07/12/2020   CL 104 07/12/2020   CALCIUM 10.0 07/12/2020     Hepatic Lab Results  Component Value Date   AST 25 07/12/2020   ALT 34 (H) 07/12/2020   ALBUMIN 4.0 07/27/2019   ALKPHOS 95 07/27/2019   LIPASE 36 04/29/2015     ID Lab Results  Component Value Date   HIV NON REACTIVE 01/23/2016   SARSCOV2NAA NEGATIVE 10/07/2019   HCVAB NON-REACTIVE 09/23/2016   PREGTESTUR NEGATIVE 01/28/2016     Bone Lab Results  Component Value Date   VD25OH 53 07/20/2019     Endocrine Lab Results  Component Value Date   GLUCOSE 100 (H) 07/12/2020   GLUCOSEU Negative 04/27/2012   HGBA1C 5.5 07/12/2020   TSH 1.36 08/19/2018     Neuropathy Lab Results  Component Value Date   HGBA1C 5.5 07/12/2020   HIV NON REACTIVE 01/23/2016     CNS No results found for: COLORCSF, APPEARCSF, RBCCOUNTCSF, WBCCSF, POLYSCSF, LYMPHSCSF, EOSCSF, PROTEINCSF, GLUCCSF, JCVIRUS, CSFOLI, IGGCSF, LABACHR, ACETBL, LABACHR, ACETBL   Inflammation (CRP: Acute  ESR: Chronic) Lab Results  Component Value Date   CRP 3.1 07/20/2019   ESRSEDRATE 9 07/20/2019     Rheumatology Lab Results  Component Value Date   RF 10.7 08/30/2014   ANA Negative 08/30/2014     Coagulation Lab Results  Component Value Date   PLT 348 07/12/2020     Cardiovascular Lab Results  Component Value Date   HGB 15.5 07/12/2020   HCT 47.1 (H) 07/12/2020     Screening Lab Results  Component Value Date   SARSCOV2NAA NEGATIVE 10/07/2019   HCVAB NON-REACTIVE 09/23/2016   HIV NON REACTIVE 01/23/2016   PREGTESTUR NEGATIVE 01/28/2016     Cancer No results found for: CEA, CA125, LABCA2   Allergens No results found for: ALMOND, APPLE, ASPARAGUS, AVOCADO, BANANA, BARLEY, BASIL, BAYLE[place], GREENBEAN, LIMABEAN, WHITEBEAN, BEEFIGE, REDBEET, BLUEBERRY, BROCCOLI, CABBAGE, MELON, CARROT, CASEIN, CASHEWNUT, CAULIFLOWER, CELERY     Note: Lab results reviewed.  PFSH  Drug: Ms. Flannigan  reports no  history of drug use. Alcohol:  reports current alcohol use. Tobacco:  reports that she has been smoking cigarettes. She started smoking about 33 years ago. She has a 37.00 pack-year smoking history. She has never used smokeless tobacco. Medical:  has a past medical history of Anxiety, Arthritis, GERD (gastroesophageal reflux disease), Heart murmur, Hypertension, Inflammatory arthritis, and Vitamin D deficiency. Family: family history includes Breast cancer in her maternal aunt; COPD in her father; Cancer in her mother; Diabetes in her maternal grandmother and son; Heart disease in her father and maternal grandmother; Hypertension in her father; Lung cancer in her mother; Ovarian cancer in her maternal aunt; Parkinson's disease in her paternal grandfather; Spina bifida in her sister.  Past Surgical History:  Procedure Laterality Date   ABDOMINAL HYSTERECTOMY     BREAST BIOPSY Left    stereo- neg   BREAST CYST ASPIRATION Right    neg   BREAST SURGERY     benign biopsy   CHOLECYSTECTOMY     COLONOSCOPY WITH PROPOFOL N/A 10/11/2019   Procedure: COLONOSCOPY WITH PROPOFOL;  Surgeon: [name], MD;  Location: [facility] ENDOSCOPY;  Service: Gastroenterology;  Laterality: N/A;   DILATION AND CURETTAGE OF UTERUS     LAPAROSCOPIC LYSIS OF ADHESIONS  12/10/2015   Procedure: LAPAROSCOPIC LYSIS OF ADHESIONS;  Surgeon: Brayton Mars, MD;  Location: [facility] ORS;  Service: Gynecology;;   LAPAROSCOPIC VAGINAL HYSTERECTOMY WITH SALPINGO OOPHORECTOMY Bilateral 01/28/2016   Procedure: LAPAROSCOPIC ASSISTED VAGINAL HYSTERECTOMY WITH SALPINGO OOPHORECTOMY;  Surgeon: Brayton Mars, MD;  Location: [facility] ORS;  Service: Gynecology;  Laterality: Bilateral;   LAPAROSCOPY N/A 12/10/2015   Procedure: LAPAROSCOPY DIAGNOSTIC WITH BIOPSIES;  Surgeon: Brayton Mars, MD;  Location: [facility] ORS;  Service: Gynecology;  Laterality: N/A;   SHOULDER ARTHROSCOPY Right    TUBAL LIGATION     UTERINE FIBROID EMBOLIZATION      Active Ambulatory Problems    Diagnosis Date Noted   Hypertension 08/30/2014   Anxiety and depression 08/30/2014   DDD (degenerative disc disease), lumbar 07/26/2014   Cardiac murmur 08/30/2014   HLD (hyperlipidemia) 08/30/2014   Insomnia 03/27/2015   Family history of ovarian cancer 11/13/2015   Status post laparoscopic assisted vaginal hysterectomy (LAVH) 01/28/2016   Surgical menopause on hormone replacement  therapy 04/23/2016   Vitamin D deficiency 05/28/2016   Inflammatory arthritis 05/07/2017   Current tobacco use 05/07/2017   Thoracic aorta atherosclerosis ([place]) 11/10/2017   Centrilobular emphysema ([place]) 11/10/2017   Abnormal liver CT 11/10/2017   Recurrent major depressive disorder, in remission ([place]) 02/18/2018   Chronic pain syndrome 11/14/2020   Pharmacologic therapy 11/14/2020   Disorder of skeletal system 11/14/2020   Problems influencing health status 11/14/2020   Chronic low back pain (1ry area of Pain) (Bilateral) (L>R) w/o sciatica 11/14/2020   Lumbar facet joint syndrome (Bilateral) 11/14/2020   Chronic wrist pain (2ry area of Pain) (Right) 11/14/2020   History of carpal tunnel surgery of wrist (Right) 11/14/2020   Chronic neck pain (3ry area of Pain) (Bilateral) (L>R) 11/14/2020   Cervicalgia 11/14/2020   Cervical facet syndrome 11/14/2020   Chronic hip pain (4th area of Pain) (Bilateral) 11/14/2020   Resolved Ambulatory Problems    Diagnosis Date Noted   Encounter for removal of sutures 07/12/2015   Dysmenorrhea 11/13/2015   Uterine leiomyoma 11/13/2015   Abdominal bloating 11/13/2015   Mature cystic teratoma of ovary, 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 In no apparent acute distress Vitals:   11/14/20 0846  BP: (!) 160/71  Pulse: 69  Resp: 18  Temp: (!) 97.2 F (36.2 C)  SpO2: 98%  Weight: 179 lb (81.2 kg)  Height: _0  (1.575 m)   BMI Assessment: Estimated body mass index is 32.74 kg/m as calculated from the following:   Height as of this encounter: _1  (1.575 m).   Weight as of this encounter: 179 lb (81.2 kg).  BMI interpretation table: BMI level Category Range association with higher incidence of chronic pain  <18 kg/m2 Underweight   18.5-24.9 kg/m2 Ideal body weight   25-29.9 kg/m2 Overweight Increased incidence by 20%  30-34.9 kg/m2 Obese (Class I) Increased incidence by 68%  35-39.9 kg/m2 Severe obesity (Class II) Increased incidence by 136%  >40 kg/m2 Extreme obesity (Class III) Increased incidence by 254%   Patient's current BMI Ideal Body weight  Body mass index is 32.74 kg/m. Ideal body weight: 50.1 kg (110 lb 7.2 oz) Adjusted ideal body weight: 62.5 kg (137 lb 13.9 oz)   BMI Readings from Last 4 Encounters:  11/14/20 32.74 kg/m  07/12/20 32.37 kg/m  01/19/20 33.11 kg/m  10/11/19 34.01 kg/m   Wt Readings from Last 4 Encounters:  11/14/20 179 lb (81.2 kg)  07/12/20 177 lb (80.3 kg)  01/19/20 181 lb (82.1 kg)  10/11/19 180 lb (81.6 kg)    Psych/Mental status: Alert, oriented x 3 (person, place, & time)       Eyes: PERLA Respiratory: No evidence of acute respiratory distress  Assessment  Primary Diagnosis & Pertinent Problem List: The primary encounter diagnosis was Chronic low back pain (1ry area of Pain) (Bilateral) (L>R) w/o sciatica. Diagnoses of Lumbar facet joint syndrome (Bilateral), Chronic wrist pain (2ry area of Pain) (Right), History of carpal tunnel surgery of wrist (Right), Chronic neck pain (3ry area of Pain) (Bilateral) (L>R), Cervicalgia, Cervical facet syndrome, Chronic hip pain (4th area of Pain) (Bilateral), Chronic pain syndrome, Pharmacologic therapy, Disorder of  skeletal system, Problems  influencing health status, Encounter for chronic pain management, and Vitamin D deficiency were also pertinent to this visit.  Visit Diagnosis (New problems to examiner): 1. Chronic low back pain (1ry area of Pain) (Bilateral) (L>R) w/o sciatica   2. Lumbar facet joint syndrome (Bilateral)   3. Chronic wrist pain (2ry area of Pain) (Right)   4. History of carpal tunnel surgery of wrist (Right)   5. Chronic neck pain (3ry area of Pain) (Bilateral) (L>R)   6. Cervicalgia   7. Cervical facet syndrome   8. Chronic hip pain (4th area of Pain) (Bilateral)   9. Chronic pain syndrome   10. Pharmacologic therapy   11. Disorder of skeletal system   12. Problems influencing health status   13. Encounter for chronic pain management   14. Vitamin D deficiency    Plan of Care (Initial workup plan)  Note: Ms. Veldman was reminded that as per protocol, today's visit has been an evaluation only. We have not taken over the patient's controlled substance management.  Problem-specific plan: No problem-specific Assessment & Plan notes found for this encounter. Lab Orders         Compliance Drug Analysis, Ur         Comp. Metabolic Panel (12)         Magnesium         Vitamin B12         Sedimentation rate         25-Hydroxy vitamin D Lcms D2+D3         C-reactive protein     Imaging Orders         DG Cervical Spine With Flex & Extend         DG Lumbar Spine Complete W/Bend         DG HIP UNILAT W OR W/O PELVIS 2-3 VIEWS RIGHT         DG HIP UNILAT W OR W/O PELVIS 2-3 VIEWS LEFT         DG Wrist Complete Right     Referral Orders  No referral(s) requested today   Procedure Orders    No procedure(s) ordered today   Pharmacotherapy (current): Medications ordered:  No orders of the defined types were placed in this encounter.  Medications administered during this visit: Breelyn Icard had no medications administered during this visit.   Pharmacological management  options:  Opioid Analgesics: The patient was informed that there is no guarantee that she would be a candidate for opioid analgesics. The decision will be made following CDC guidelines. This decision will be based on the results of diagnostic studies, as well as Ms. Watkin's risk profile.   Membrane stabilizer: To be determined at a later time  Muscle relaxant: To be determined at a later time  NSAID: To be determined at a later time  Other analgesic(s): To be determined at a later time   Interventional management options: Ms. Chirino was informed that there is no guarantee that she would be a candidate for interventional therapies. The decision will be based on the results of diagnostic studies, as well as Ms. Geister's risk profile.  Procedure(s) under consideration:  Pending results of ordered studies      Interventional Therapies  Risk  Complexity Considerations:   Estimated body mass index is 32.74 kg/m as calculated from the following:   Height as of this encounter: _0  (1.575 m).   Weight as of this encounter: 179 lb (81.2 kg). WNL   Planned  Pending:   Pending further evaluation   Under consideration:   Diagnostic left lumbar facet MBB    Completed:   None at this time   Completed by Dr. Sharlet [facility]):   1.  Bilateral L5-S1 transforaminal ESI (07/07/2014) 2.  Left L5-S1 transforaminal ESI + right S1 TFESI (07/31/2014)    Therapeutic  Palliative (PRN) options:   None established    Provider-requested follow-up: Return for (37mn), Eval-day (M,W), (F2F), 2nd Visit, for review of ordered tests.  Future Appointments  Date Time Provider DDixon 11/21/2020  9:40 AM [facility] 01/14/2021  8:40 AM NMilinda Pointer MD [facility] None  01/14/2021  9:40 AM [facility]    Note by: FGaspar Cola MD Date: 11/14/2020; Time: 10:10 AM

## 2020-11-14 NOTE — Progress Notes (Signed)
Safety precautions to be maintained throughout the outpatient stay will include: orient to surroundings, keep bed in low position, maintain call bell within reach at all times, provide assistance with transfer out of bed and ambulation.  

## 2020-11-20 LAB — COMP. METABOLIC PANEL (12)
AST: 26 IU/L (ref 0–40)
Albumin/Globulin Ratio: 1.9 (ref 1.2–2.2)
Albumin: 4.4 g/dL (ref 3.8–4.9)
Alkaline Phosphatase: 109 IU/L (ref 44–121)
BUN/Creatinine Ratio: 22 (ref 9–23)
BUN: 13 mg/dL (ref 6–24)
Bilirubin Total: <0.2 mg/dL (ref 0.0–1.2)
Calcium: 9.9 mg/dL (ref 8.7–10.2)
Chloride: 104 mmol/L (ref 96–106)
Creatinine, Ser: 0.58 mg/dL (ref 0.57–1.00)
Globulin, Total: 2.3 g/dL (ref 1.5–4.5)
Glucose: 100 mg/dL — ABNORMAL HIGH (ref 70–99)
Potassium: 4.5 mmol/L (ref 3.5–5.2)
Sodium: 140 mmol/L (ref 134–144)
Total Protein: 6.7 g/dL (ref 6.0–8.5)
eGFR: 105 mL/min/{1.73_m2} (ref >59)

## 2020-11-20 LAB — C-REACTIVE PROTEIN: CRP: 2 mg/L (ref 0–10)

## 2020-11-20 LAB — COMPLIANCE DRUG ANALYSIS, UR

## 2020-11-20 LAB — MAGNESIUM: Magnesium: 1.9 mg/dL (ref 1.6–2.3)

## 2020-11-20 LAB — 25-HYDROXY VITAMIN D LCMS D2+D3
25-Hydroxy, Vitamin D-2: <1 ng/mL
25-Hydroxy, Vitamin D-3: 46 ng/mL
25-Hydroxy, Vitamin D: 47 ng/mL

## 2020-11-20 LAB — SEDIMENTATION RATE: Sed Rate: 14 mm/hr (ref 0–40)

## 2020-11-20 LAB — VITAMIN B12: Vitamin B-12: 274 pg/mL (ref 232–1245)

## 2020-11-21 ENCOUNTER — Encounter: Payer: 59 | Admitting: Family Medicine

## 2020-11-21 NOTE — Progress Notes (Deleted)
Name: Nichole Wells   MRN: [id number]    DOB: September 02, 1960   Date:11/21/2020       Progress Note  Subjective  Chief Complaint  Annual Exam  HPI  Patient presents for annual CPE.  Diet: *** Exercise: ***   Flowsheet Row Office Visit from 01/19/2020 in [facility]  AUDIT-C Score 0      Depression: Phq 9 is  {Desc; negative/positive:13464} Depression screen [facility] 2/9 11/14/2020 07/12/2020 01/19/2020 01/19/2020 07/20/2019  Decreased Interest 0 0 0 0 0  Down, Depressed, Hopeless 0 0 0 0 0  PHQ - 2 Score 0 0 0 0 0  Altered sleeping 0 0 0 - 0  Tired, decreased energy 0 0 1 - 0  Change in appetite 0 0 0 - 0  Feeling bad or failure about yourself  0 0 0 - 0  Trouble concentrating 0 0 0 - 0  Moving slowly or fidgety/restless 0 0 0 - 0  Suicidal thoughts 0 0 0 - 0  PHQ-9 Score 0 0 1 - 0  Difficult doing work/chores Not difficult at all - Somewhat difficult - -  Some recent data might be hidden   Hypertension: BP Readings from Last 3 Encounters:  11/14/20 (!) 160/71  07/12/20 136/82  01/19/20 122/82   Obesity: Wt Readings from Last 3 Encounters:  11/14/20 179 lb (81.2 kg)  07/12/20 177 lb (80.3 kg)  01/19/20 181 lb (82.1 kg)   BMI Readings from Last 3 Encounters:  11/14/20 32.74 kg/m  07/12/20 32.37 kg/m  01/19/20 33.11 kg/m     Vaccines:  HPV: up to at age 47 , ask insurance if age between 63-45  Shingrix: 78-64 yo and ask insurance if covered when patient above 29 yo Pneumonia: educated and discussed with patient. Flu: educated and discussed with patient.  Hep C Screening: 09/23/16 STD testing and prevention (HIV/chl/gon/syphilis): 01/30/16 Intimate partner violence: negative Sexual History : Menstrual History/LMP/Abnormal Bleeding:  Incontinence Symptoms:   Breast cancer:  - Last Mammogram: 07/22/19 - BRCA gene screening: N/A  Osteoporosis: Discussed high calcium and vitamin D supplementation, weight bearing exercises  Cervical cancer  screening: N/A  Skin cancer: Discussed monitoring for atypical lesions  Colorectal cancer: 11/28/15   Lung cancer: Low Dose CT Chest recommended if Age 33-80 years, 20 pack-year currently smoking OR have quit w/in 15years. Patient does not qualify.   ECG: 07/28/19  Advanced Care Planning: A voluntary discussion about advance care planning including the explanation and discussion of advance directives.  Discussed health care proxy and Living will, and the patient was able to identify a health care proxy as ***.  Patient does not have a living will at present time. If patient does have living will, I have requested they bring this to the clinic to be scanned in to their chart.  Lipids: Lab Results  Component Value Date   CHOL 195 07/12/2020   CHOL 138 07/20/2019   CHOL 209 (H) 08/19/2018   Lab Results  Component Value Date   HDL 53 07/12/2020   HDL 53 07/20/2019   HDL 52 08/19/2018   Lab Results  Component Value Date   LDLCALC 119 (H) 07/12/2020   LDLCALC 65 07/20/2019   LDLCALC 135 (H) 08/19/2018   Lab Results  Component Value Date   TRIG 123 07/12/2020   TRIG 112 07/20/2019   TRIG 109 08/19/2018   Lab Results  Component Value Date   CHOLHDL 3.7 07/12/2020   CHOLHDL 2.6 07/20/2019   CHOLHDL  4.0 08/19/2018   No results found for: LDLDIRECT  Glucose: Glucose  Date Value Ref Range Status  11/14/2020 100 (H) 70 - 99 mg/dL Final    Comment:                  **Please note reference interval change**  04/27/2012 106 (H) 65 - 99 mg/dL Final   Glucose, Bld  Date Value Ref Range Status  07/12/2020 100 (H) 65 - 99 mg/dL Final    Comment:    .            Fasting reference interval . For someone without known diabetes, a glucose value between 100 and 125 mg/dL is consistent with prediabetes and should be confirmed with a follow-up test. .   07/27/2019 93 70 - 99 mg/dL Final    Comment:    Glucose reference range applies only to samples taken after fasting for at  least 8 hours.  07/20/2019 102 (H) 65 - 99 mg/dL Final    Comment:    .            Fasting reference interval . For someone without known diabetes, a glucose value between 100 and 125 mg/dL is consistent with prediabetes and should be confirmed with a follow-up test. .     Patient Active Problem List   Diagnosis Date Noted   Chronic pain syndrome 11/14/2020   Pharmacologic therapy 11/14/2020   Disorder of skeletal system 11/14/2020   Problems influencing health status 11/14/2020   Chronic low back pain (1ry area of Pain) (Bilateral) (L>R) w/o sciatica 11/14/2020   Lumbar facet joint syndrome (Bilateral) 11/14/2020   Chronic wrist pain (2ry area of Pain) (Right) 11/14/2020   History of carpal tunnel surgery of wrist (Right) 11/14/2020   Chronic neck pain (3ry area of Pain) (Bilateral) (L>R) 11/14/2020   Cervicalgia 11/14/2020   Cervical facet syndrome 11/14/2020   Chronic hip pain (4th area of Pain) (Bilateral) 11/14/2020   Recurrent major depressive disorder, in remission ([place]) 02/18/2018   Thoracic aorta atherosclerosis ([place]) 11/10/2017   Centrilobular emphysema ([place]) 11/10/2017   Abnormal liver CT 11/10/2017   Inflammatory arthritis 05/07/2017   Current tobacco use 05/07/2017   Vitamin D deficiency 05/28/2016   Surgical menopause on hormone replacement therapy 04/23/2016   Status post laparoscopic assisted vaginal hysterectomy (LAVH) 01/28/2016   Family history of ovarian cancer 11/13/2015   Insomnia 03/27/2015   Hypertension 08/30/2014   Anxiety and depression 08/30/2014   Cardiac murmur 08/30/2014   HLD (hyperlipidemia) 08/30/2014   DDD (degenerative disc disease), lumbar 07/26/2014    Past Surgical History:  Procedure Laterality Date   ABDOMINAL HYSTERECTOMY     BREAST BIOPSY Left    stereo- neg   BREAST CYST ASPIRATION Right    neg   BREAST SURGERY     benign biopsy   CHOLECYSTECTOMY     COLONOSCOPY WITH PROPOFOL N/A 10/11/2019   Procedure: COLONOSCOPY  WITH PROPOFOL;  Surgeon: [name], MD;  Location: [facility];  Service: Gastroenterology;  Laterality: N/A;   DILATION AND CURETTAGE OF UTERUS     LAPAROSCOPIC LYSIS OF ADHESIONS  12/10/2015   Procedure: LAPAROSCOPIC LYSIS OF ADHESIONS;  Surgeon: Brayton Mars, MD;  Location: [facility] ORS;  Service: Gynecology;;   LAPAROSCOPIC VAGINAL HYSTERECTOMY WITH SALPINGO OOPHORECTOMY Bilateral 01/28/2016   Procedure: LAPAROSCOPIC ASSISTED VAGINAL HYSTERECTOMY WITH SALPINGO OOPHORECTOMY;  Surgeon: Brayton Mars, MD;  Location: [facility] ORS;  Service: Gynecology;  Laterality: Bilateral;   LAPAROSCOPY N/A 12/10/2015  Procedure: LAPAROSCOPY DIAGNOSTIC WITH BIOPSIES;  Surgeon: Brayton Mars, MD;  Location: [facility] ORS;  Service: Gynecology;  Laterality: N/A;   SHOULDER ARTHROSCOPY Right    TUBAL LIGATION     UTERINE FIBROID EMBOLIZATION      Family History  Problem Relation Age of Onset   Cancer Mother        cervical, lung   Lung cancer Mother    Heart disease Father    Hypertension Father    COPD Father    Breast cancer Maternal Aunt    Ovarian cancer Maternal 25    Spina bifida Sister    Diabetes Son    Diabetes Maternal Grandmother    Heart disease Maternal Grandmother    Parkinson's disease Paternal Grandfather     Social History   Socioeconomic History   Marital status: Married    Spouse name: John    Number of children: 2   Years of education: Not on file   Highest education level: Some college, no degree  Occupational History   Occupation: Freight forwarder  Tobacco Use   Smoking status: Every Day    Packs/day: 1.00    Years: 37.00    Pack years: 37.00    Types: Cigarettes    Start date: 10/06/1987   Smokeless tobacco: Never  Vaping Use   Vaping Use: Never used  Substance and Sexual Activity   Alcohol use: Yes    Alcohol/week: 0.0 standard drinks    Comment: none last 24hr   Drug use: No   Sexual activity: Yes    Partners: Male    Birth control/protection:  Surgical  Other Topics Concern   Not on file  Social History Narrative   Married   Works full time   Has 2 grown children    Social Determinants of Radio broadcast assistant Strain: Not on file  Food Insecurity: Not on file  Transportation Needs: Not on file  Physical Activity: Not on file  Stress: Not on file  Social Connections: Not on file  Intimate Partner Violence: Not on file     Current Outpatient Medications:    amLODipine (NORVASC) 10 MG tablet, Take 1 tablet (10 mg total) by mouth daily., Disp: 90 tablet, Rfl: 1   aspirin 81 MG EC tablet, TAKE 1 TABLET BY MOUTH EVERY DAY, Disp: 30 tablet, Rfl: 5   cetirizine (ZYRTEC) 10 MG tablet, Take 10 mg by mouth daily., Disp: , Rfl:    FLUoxetine (PROZAC) 40 MG capsule, TAKE 1 CAPSULE BY MOUTH EVERY DAY, Disp: 90 capsule, Rfl: 1   fluticasone (FLONASE) 50 MCG/ACT nasal spray, SPRAY 2 SPRAYS INTO EACH NOSTRIL EVERY DAY, Disp: 48 mL, Rfl: 2   Fluticasone-Umeclidin-Vilant (TRELEGY ELLIPTA) 100-62.5-25 MCG/INH AEPB, Inhale 1 puff into the lungs daily., Disp: 60 each, Rfl: 5   gabapentin (NEURONTIN) 100 MG capsule, Take 1 capsule (100 mg total) by mouth at bedtime., Disp: 90 capsule, Rfl: 1   hydrochlorothiazide (HYDRODIURIL) 12.5 MG tablet, TAKE 1 TABLET BY MOUTH EVERY DAY, Disp: 90 tablet, Rfl: 0   metoprolol succinate (TOPROL-XL) 50 MG 24 hr tablet, Take 1 tablet (50 mg total) by mouth daily. Take with or immediately following a meal., Disp: 90 tablet, Rfl: 1   rosuvastatin (CRESTOR) 40 MG tablet, TAKE 1 TABLET BY MOUTH EVERY DAY, Disp: 90 tablet, Rfl: 0   Vitamin D, Cholecalciferol, 50 MCG (2000 UT) CAPS, Take by mouth daily., Disp: , Rfl:   Allergies  Allergen Reactions   Hydroxychloroquine Itching  Pt take zyrtec to alleviate symptoms    Penicillin G Hives    Has patient had a PCN reaction causing immediate rash, facial/tongue/throat swelling, SOB or lightheadedness with hypotension:unsure Has patient had a PCN reaction  causing severe rash involving mucus membranes or skin necrosis:unsure Has patient had a PCN reaction that required hospitalization:unsure Has patient had a PCN reaction occurring within the last 10 years:No If all of the above answers are "NO", then may proceed with Cephalosporin use.      ROS  ***  Objective  There were no vitals filed for this visit.  There is no height or weight on file to calculate BMI.  Physical Exam ***  Recent Results (from the past 2160 hour(s))  Compliance Drug Analysis, Ur     Status: None   Collection Time: 11/14/20  9:44 AM  Result Value Ref Range   Summary Note     Comment: ==================================================================== Compliance Drug Analysis, Ur ==================================================================== Test                             Result       Flag       Units  Drug Present   Fluoxetine                     PRESENT   Norfluoxetine                  PRESENT    Norfluoxetine is an expected metabolite of fluoxetine.    Naproxen                       PRESENT   Metoprolol                     PRESENT ==================================================================== Test                      Result    Flag   Units      Ref Range   Creatinine              63               mg/dL      >=20 ==================================================================== Declared Medications:  Medication list was not provided. ==================================================================== For clinical consultation, please call [phone]. ====================================================================   Comp. Metabolic Panel (12)     Status: Abnormal   Collection Time: 11/14/20  3:01 PM  Result Value Ref Range   Glucose 100 (H) 70 - 99 mg/dL    Comment:               **Please note reference interval change**   BUN 13 6 - 24 mg/dL   Creatinine, Ser 0.58 0.57 - 1.00 mg/dL   eGFR 105 >59 mL/min/1.73    BUN/Creatinine Ratio 22 9 - 23   Sodium 140 134 - 144 mmol/L   Potassium 4.5 3.5 - 5.2 mmol/L   Chloride 104 96 - 106 mmol/L   Calcium 9.9 8.7 - 10.2 mg/dL   Total Protein 6.7 6.0 - 8.5 g/dL   Albumin 4.4 3.8 - 4.9 g/dL   Globulin, Total 2.3 1.5 - 4.5 g/dL   Albumin/Globulin Ratio 1.9 1.2 - 2.2   Bilirubin Total <0.2 0.0 - 1.2 mg/dL   Alkaline Phosphatase 109 44 - 121 IU/L   AST 26 0 - 40 IU/L  Magnesium     Status: None  Collection Time: 11/14/20  3:01 PM  Result Value Ref Range   Magnesium 1.9 1.6 - 2.3 mg/dL  Vitamin B12     Status: None   Collection Time: 11/14/20  3:01 PM  Result Value Ref Range   Vitamin B-12 274 232 - 1,245 pg/mL  Sedimentation rate     Status: None   Collection Time: 11/14/20  3:01 PM  Result Value Ref Range   Sed Rate 14 0 - 40 mm/hr  25-Hydroxy vitamin D Lcms D2+D3     Status: None   Collection Time: 11/14/20  3:01 PM  Result Value Ref Range   25-Hydroxy, Vitamin D 47 ng/mL    Comment: Reference Range: All Ages: Target levels 30 - 100    25-Hydroxy, Vitamin D-2 <1.0 ng/mL    Comment: This test was developed and its performance characteristics determined by [facility]. It has not been cleared or approved by the Food and Drug Administration.    25-Hydroxy, Vitamin D-3 46 ng/mL    Comment: This test was developed and its performance characteristics determined by [facility]. It has not been cleared or approved by the Food and Drug Administration.   C-reactive protein     Status: None   Collection Time: 11/14/20  3:01 PM  Result Value Ref Range   CRP 2 0 - 10 mg/L     Fall Risk: Fall Risk  11/14/2020 07/12/2020 01/19/2020 07/20/2019 01/18/2019  Falls in the past year? 0 0 0 0 0  Number falls in past yr: 0 0 0 0 0  Injury with Fall? 0 0 0 0 0  Risk for fall due to : (No Data) - - - -  Risk for fall due to: Comment patient stumbles - - - -     Functional Status Survey:     Assessment & Plan  1. Well adult exam ***   -USPSTF grade A and B  recommendations reviewed with patient; age-appropriate recommendations, preventive care, screening tests, etc discussed and encouraged; healthy living encouraged; see AVS for patient education given to patient -Discussed importance of 150 minutes of physical activity weekly, eat two servings of fish weekly, eat one serving of tree nuts ( cashews, pistachios, pecans, almonds.Marland Kitchen) every other day, eat 6 servings of fruit/vegetables daily and drink plenty of water and avoid sweet beverages.   -Reviewed Health Maintenance: ***

## 2020-12-24 IMAGING — CT CT CHEST W/O CM
2 of 4 series · 15 of 36 positions shown, 18 images · non-contrast
Comparison: CTA CAP 07/27/2019 and CT chest 11/10/2017

CLINICAL DATA: Follow-up lung nodule. Smoker with 37 pack-year
history.

EXAM:
CT CHEST WITHOUT CONTRAST
TECHNIQUE: Multidetector CT imaging of the chest was performed following the
standard protocol without IV contrast.

[Series 2: chest 2.00 · axial · 0.77mm/px · z∈[-1174,-910]mm · 12 of 156 slices shown, 15 images]
[im 12/156  mediastinal]
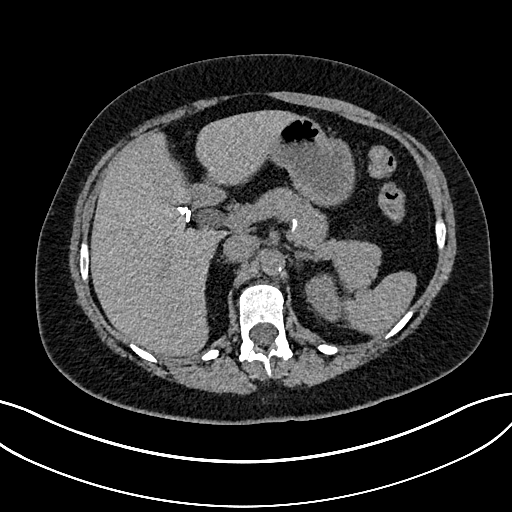
[im 12/156  lung]
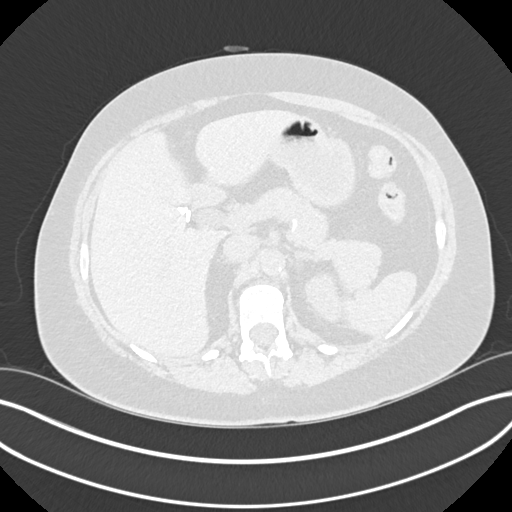
[im 24/156  lung]
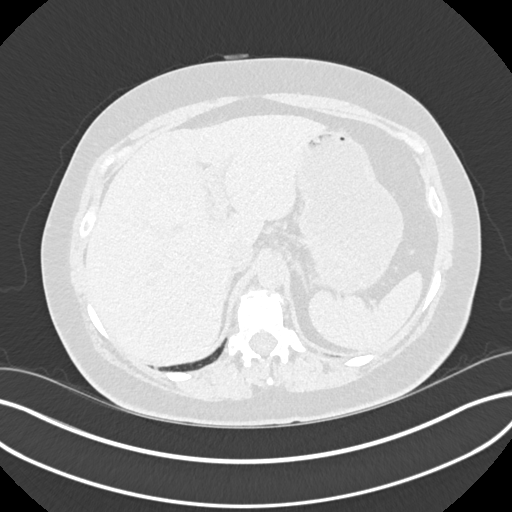
[im 36/156  lung]
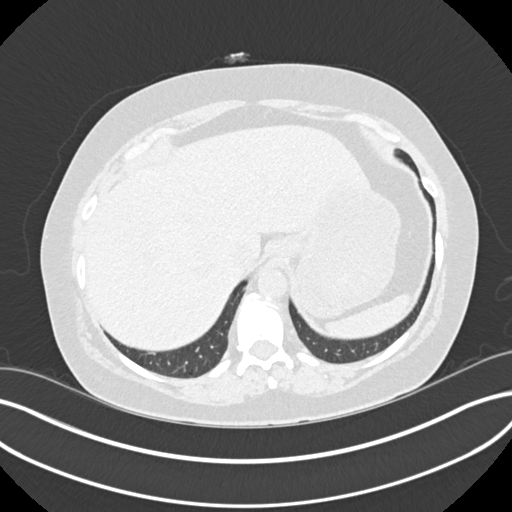
[im 48/156  lung]
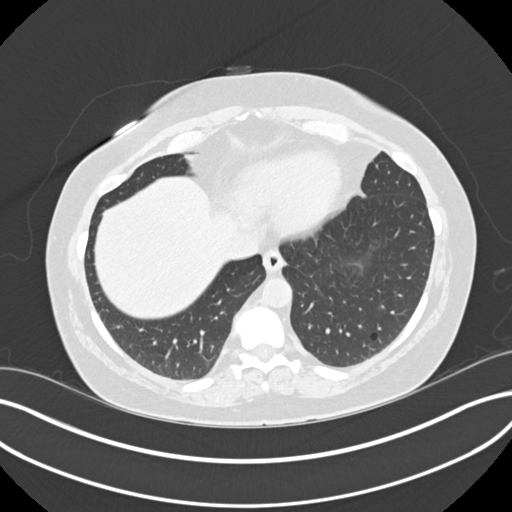
[im 60/156  mediastinal]
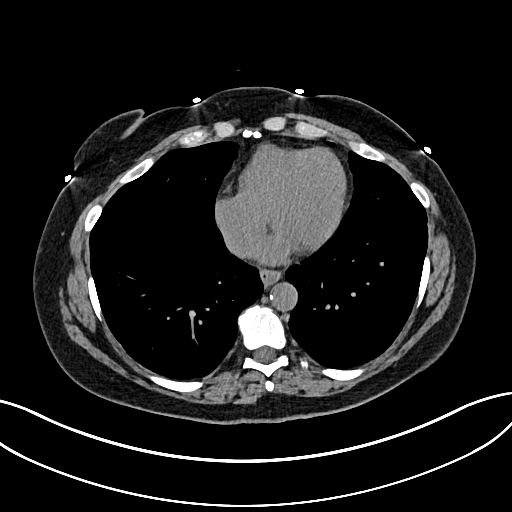
[im 60/156  lung]
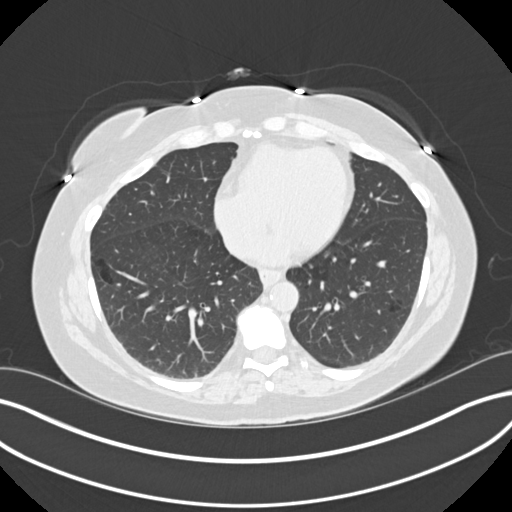
[im 72/156  lung]
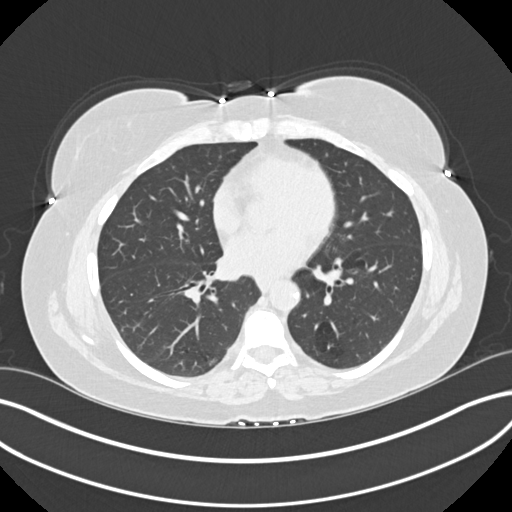
[im 84/156  lung]
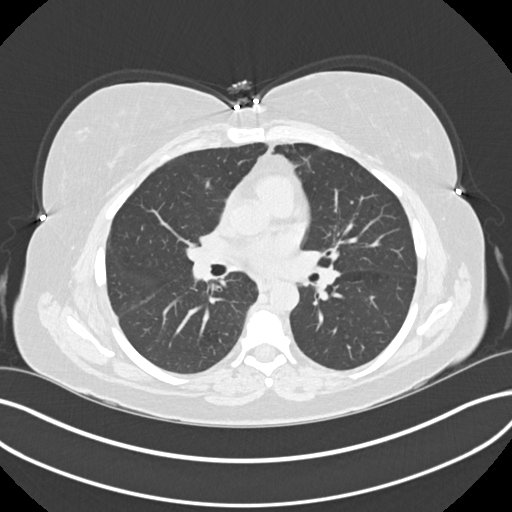
[im 96/156  lung]
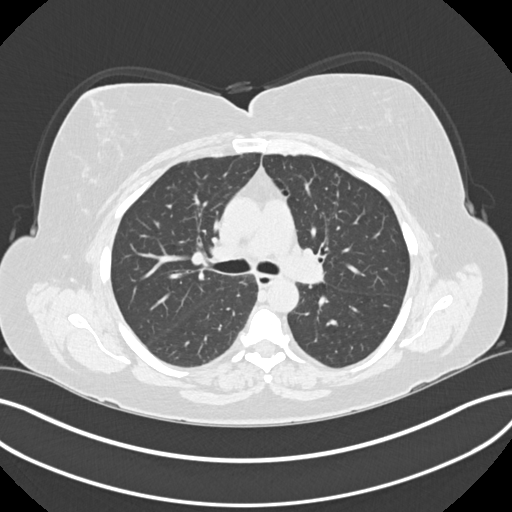
[im 108/156  mediastinal]
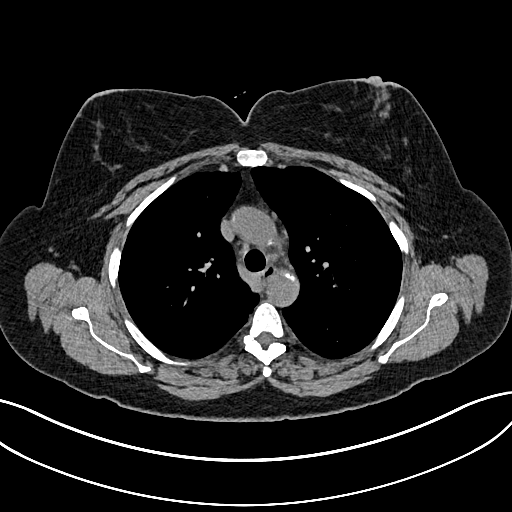
[im 108/156  lung]
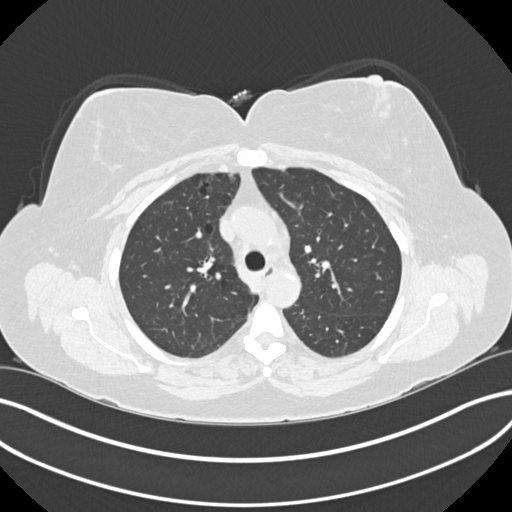
[im 120/156  lung]
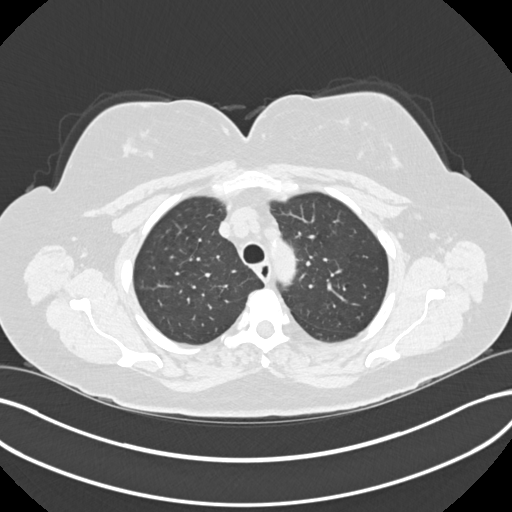
[im 132/156  lung]
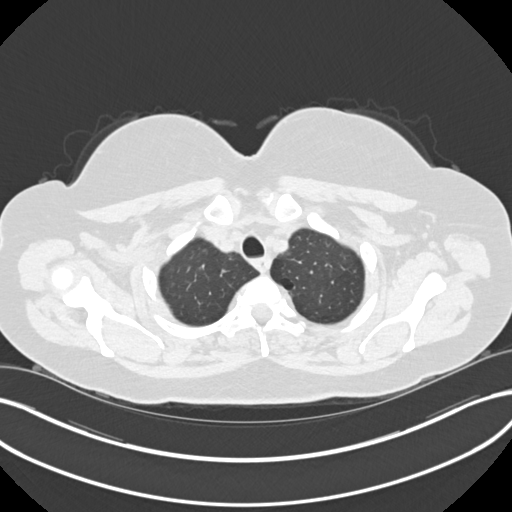
[im 144/156  lung]
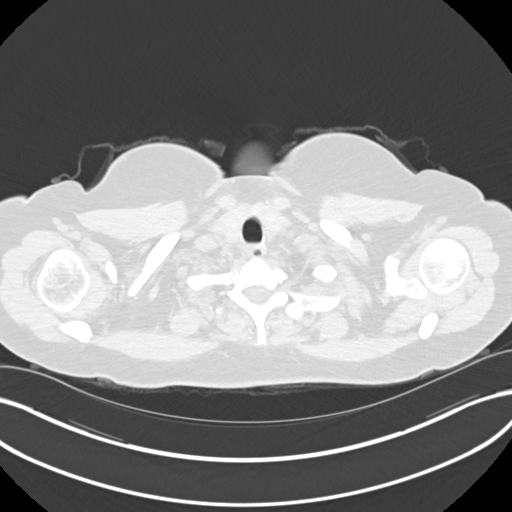

[Series 5: coronals chest 2.00 cor · coronal · 0.61mm/px · 3 of 161 slices shown]
[im 33/161  lung]
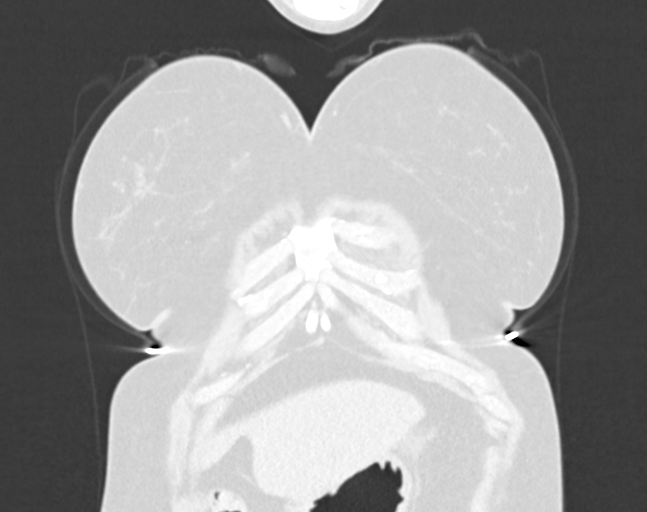
[im 65/161  lung]
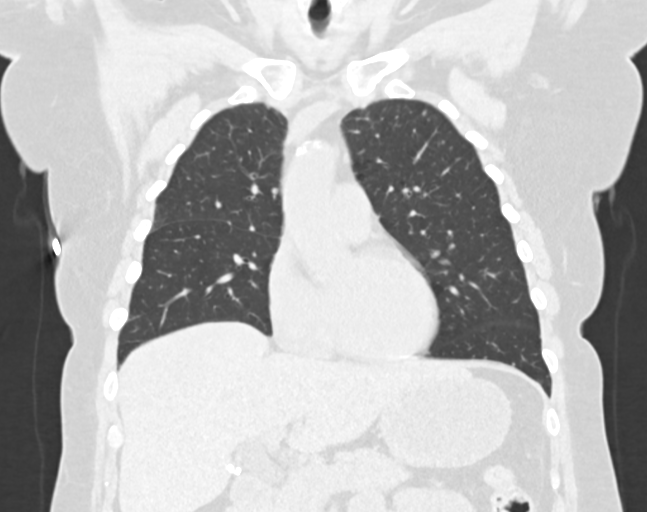
[im 97/161  lung]
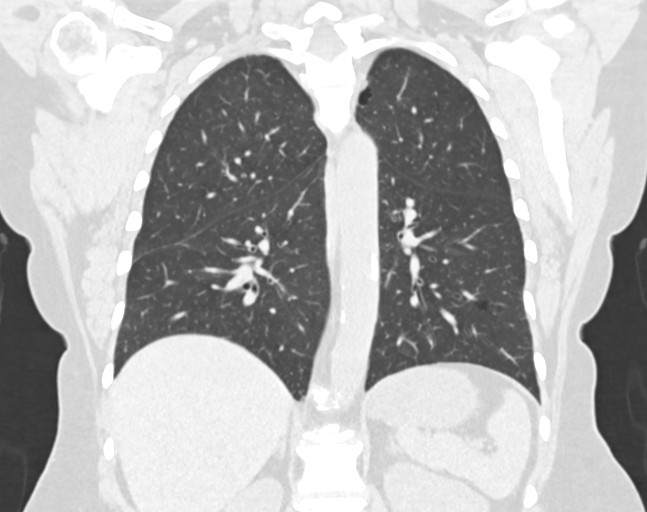

[15 of 36 positions shown; findings below may reference images not displayed]

FINDINGS: Cardiovascular: The heart size appears within normal limits. No
pericardial effusion. Aortic atherosclerosis.

Mediastinum/Nodes: No enlarged mediastinal or axillary lymph nodes.
Thyroid gland, trachea, and esophagus demonstrate no significant
findings.

Lungs/Pleura: Centrilobular and paraseptal emphysema is noted. No
pleural effusion, airspace consolidation or atelectasis. Previously
noted ground-glass densities within the anterior portions of the
upper lobes appear improved from previous exam.

-2 mm anterior left upper lobe lung nodule is unchanged from
11/10/2017 compatible with a benign abnormality, image 35/3.

-Also unchanged from 11/10/2017 is a 2 mm nodule within the anterior
right lung apex, image [DATE].

Upper Abdomen: No acute abnormality.  Previous cholecystectomy.

Musculoskeletal: Degenerative disc disease noted within the thoracic
spine. No acute or suspicious osseous findings.
IMPRESSION: 1. No acute cardiopulmonary abnormalities.
2. Stable appearance of small bilateral upper lobe lung nodules.
These are unchanged from 11/10/2017 compatible with a benign
abnormality.
3. Previous anterior upper lobe areas of ground-glass attenuation
have improved in the interval compatible with
postinflammatory/infectious process.
4. Emphysema and aortic atherosclerosis.

Aortic Atherosclerosis (AYEXK-DA4.4) and Emphysema (AYEXK-MNT.F).

## 2020-12-26 ENCOUNTER — Telehealth (INDEPENDENT_AMBULATORY_CARE_PROVIDER_SITE_OTHER): Payer: Self-pay | Admitting: Family Medicine

## 2020-12-26 ENCOUNTER — Encounter: Payer: Self-pay | Admitting: Family Medicine

## 2020-12-26 DIAGNOSIS — J432 Centrilobular emphysema: Secondary | ICD-10-CM

## 2020-12-26 DIAGNOSIS — B349 Viral infection, unspecified: Secondary | ICD-10-CM

## 2020-12-26 LAB — POCT INFLUENZA A/B
Influenza A, POC: NEGATIVE
Influenza B, POC: NEGATIVE

## 2020-12-26 MED ORDER — BENZONATATE 100 MG PO CAPS: 100.0000 mg | ORAL_CAPSULE | Freq: Three times a day (TID) | ORAL | 0 refills | Status: DC | PRN

## 2020-12-26 MED ORDER — BREZTRI AEROSPHERE 160-9-4.8 MCG/ACT IN AERO: 2.0000 | INHALATION_SPRAY | Freq: Two times a day (BID) | RESPIRATORY_TRACT | 2 refills | Status: DC

## 2020-12-26 MED ORDER — ALBUTEROL SULFATE HFA 108 (90 BASE) MCG/ACT IN AERS: 2.0000 | INHALATION_SPRAY | Freq: Four times a day (QID) | RESPIRATORY_TRACT | 0 refills | Status: DC | PRN

## 2020-12-26 NOTE — Progress Notes (Signed)
Name: Nichole Wells   MRN: [id number]    DOB: [birth year]   Date:12/26/2020       Progress Note  Subjective  Chief Complaint  Chief Complaint  Patient presents with   Cough    X4 days   Fever    102/104    I connected with  Nichole Wells  on 12/26/20 at  9:20 AM EST by a video enabled telemedicine application and verified that I am speaking with the correct person using two identifiers.  I discussed the limitations of evaluation and management by telemedicine and the availability of in person appointments. The patient expressed understanding and agreed to proceed with the virtual visit  Staff also discussed with the patient that there may be a patient responsible charge related to this service. Patient Location: at home  Provider Location: [facility] Additional Individuals present: alone   HPI  Cough: she states symptoms started with a cough four days ago, the following day developed rhinorrhea and nasal congestion, headache is mild but also present. Last night her temperature spiked at 104 and developed body aches . She states cough is the worse symptom. She works at a pharmacy. She is up to date with flu shot but not the last COVID-19 booster. She states appetites is poor. She has been staying hydrated. Tried Mucinex and aspirin.   Emphysema: she still smokes, she was not able to fill Trelegy , not approved by insurance. Discussed risk of complications in case she has COVID-19 or flu, social isolation, avoid sedating medications when reclining at night. We will switch to [facility] and also add albuterol, call 911 if feels increase in SOB  Patient Active Problem List   Diagnosis Date Noted   Chronic pain syndrome 11/14/2020   Pharmacologic therapy 11/14/2020   Disorder of skeletal system 11/14/2020   Problems influencing health status 11/14/2020   Chronic low back pain (1ry area of Pain) (Bilateral) (L>R) w/o sciatica 11/14/2020   Lumbar facet joint syndrome (Bilateral) 11/14/2020    Chronic wrist pain (2ry area of Pain) (Right) 11/14/2020   History of carpal tunnel surgery of wrist (Right) 11/14/2020   Chronic neck pain (3ry area of Pain) (Bilateral) (L>R) 11/14/2020   Cervicalgia 11/14/2020   Cervical facet syndrome 11/14/2020   Chronic hip pain (4th area of Pain) (Bilateral) 11/14/2020   Recurrent major depressive disorder, in remission ([place]) 02/18/2018   Thoracic aorta atherosclerosis ([place]) 11/10/2017   Centrilobular emphysema ([place]) 11/10/2017   Abnormal liver CT 11/10/2017   Inflammatory arthritis 05/07/2017   Current tobacco use 05/07/2017   Vitamin D deficiency 05/28/2016   Surgical menopause on hormone replacement therapy 04/23/2016   Status post laparoscopic assisted vaginal hysterectomy (LAVH) 01/28/2016   Family history of ovarian cancer 11/13/2015   Insomnia 03/27/2015   Hypertension 08/30/2014   Anxiety and depression 08/30/2014   Cardiac murmur 08/30/2014   HLD (hyperlipidemia) 08/30/2014   DDD (degenerative disc disease), lumbar 07/26/2014    Past Surgical History:  Procedure Laterality Date   ABDOMINAL HYSTERECTOMY     BREAST BIOPSY Left    stereo- neg   BREAST CYST ASPIRATION Right    neg   BREAST SURGERY     benign biopsy   CHOLECYSTECTOMY     COLONOSCOPY WITH PROPOFOL N/A 10/11/2019   Procedure: COLONOSCOPY WITH PROPOFOL;  Surgeon: [name], MD;  Location: [facility] ENDOSCOPY;  Service: Gastroenterology;  Laterality: N/A;   DILATION AND CURETTAGE OF UTERUS     LAPAROSCOPIC LYSIS OF ADHESIONS  12/10/2015  Procedure: LAPAROSCOPIC LYSIS OF ADHESIONS;  Surgeon: Brayton Mars, MD;  Location: [facility] ORS;  Service: Gynecology;;   LAPAROSCOPIC VAGINAL HYSTERECTOMY WITH SALPINGO OOPHORECTOMY Bilateral 01/28/2016   Procedure: LAPAROSCOPIC ASSISTED VAGINAL HYSTERECTOMY WITH SALPINGO OOPHORECTOMY;  Surgeon: Brayton Mars, MD;  Location: [facility] ORS;  Service: Gynecology;  Laterality: Bilateral;   LAPAROSCOPY N/A 12/10/2015   Procedure:  LAPAROSCOPY DIAGNOSTIC WITH BIOPSIES;  Surgeon: Brayton Mars, MD;  Location: [facility] ORS;  Service: Gynecology;  Laterality: N/A;   SHOULDER ARTHROSCOPY Right    TUBAL LIGATION     UTERINE FIBROID EMBOLIZATION      Family History  Problem Relation Age of Onset   Cancer Mother        cervical, lung   Lung cancer Mother    Heart disease Father    Hypertension Father    COPD Father    Breast cancer Maternal Aunt    Ovarian cancer Maternal 88    Spina bifida Sister    Diabetes Son    Diabetes Maternal Grandmother    Heart disease Maternal Grandmother    Parkinson's disease Paternal Grandfather     Social History   Socioeconomic History   Marital status: Married    Spouse name: John    Number of children: 2   Years of education: Not on file   Highest education level: Some college, no degree  Occupational History   Occupation: Freight forwarder  Tobacco Use   Smoking status: Every Day    Packs/day: 1.00    Years: 37.00    Pack years: 37.00    Types: Cigarettes    Start date: 10/06/1987   Smokeless tobacco: Never  Vaping Use   Vaping Use: Never used  Substance and Sexual Activity   Alcohol use: Yes    Alcohol/week: 0.0 standard drinks    Comment: none last 24hr   Drug use: No   Sexual activity: Yes    Partners: Male    Birth control/protection: Surgical  Other Topics Concern   Not on file  Social History Narrative   Married   Works full time   Has 2 grown children    Social Determinants of Radio broadcast assistant Strain: Not on file  Food Insecurity: Not on file  Transportation Needs: Not on file  Physical Activity: Not on file  Stress: Not on file  Social Connections: Not on file  Intimate Partner Violence: Not on file     Current Outpatient Medications:    amLODipine (NORVASC) 10 MG tablet, Take 1 tablet (10 mg total) by mouth daily., Disp: 90 tablet, Rfl: 1   aspirin 81 MG EC tablet, TAKE 1 TABLET BY MOUTH EVERY DAY, Disp: 30 tablet, Rfl: 5    cetirizine (ZYRTEC) 10 MG tablet, Take 10 mg by mouth daily., Disp: , Rfl:    FLUoxetine (PROZAC) 40 MG capsule, TAKE 1 CAPSULE BY MOUTH EVERY DAY, Disp: 90 capsule, Rfl: 1   fluticasone (FLONASE) 50 MCG/ACT nasal spray, SPRAY 2 SPRAYS INTO EACH NOSTRIL EVERY DAY, Disp: 48 mL, Rfl: 2   Fluticasone-Umeclidin-Vilant (TRELEGY ELLIPTA) 100-62.5-25 MCG/INH AEPB, Inhale 1 puff into the lungs daily., Disp: 60 each, Rfl: 5   gabapentin (NEURONTIN) 100 MG capsule, Take 1 capsule (100 mg total) by mouth at bedtime., Disp: 90 capsule, Rfl: 1   hydrochlorothiazide (HYDRODIURIL) 12.5 MG tablet, TAKE 1 TABLET BY MOUTH EVERY DAY, Disp: 90 tablet, Rfl: 0   metoprolol succinate (TOPROL-XL) 50 MG 24 hr tablet, Take 1 tablet (50 mg total)  by mouth daily. Take with or immediately following a meal., Disp: 90 tablet, Rfl: 1   rosuvastatin (CRESTOR) 40 MG tablet, TAKE 1 TABLET BY MOUTH EVERY DAY, Disp: 90 tablet, Rfl: 0   Vitamin D, Cholecalciferol, 50 MCG (2000 UT) CAPS, Take by mouth daily., Disp: , Rfl:   Allergies  Allergen Reactions   Hydroxychloroquine Itching    Pt take zyrtec to alleviate symptoms    Penicillin G Hives    Has patient had a PCN reaction causing immediate rash, facial/tongue/throat swelling, SOB or lightheadedness with hypotension:unsure Has patient had a PCN reaction causing severe rash involving mucus membranes or skin necrosis:unsure Has patient had a PCN reaction that required hospitalization:unsure Has patient had a PCN reaction occurring within the last 10 years:No If all of the above answers are "NO", then may proceed with Cephalosporin use.     I personally reviewed active problem list, medication list, allergies, family history, social history, health maintenance with the patient/caregiver today.   ROS  Ten systems reviewed and is negative except as mentioned in HPI   Objective  Virtual encounter, vitals not obtained.  There is no height or weight on file to calculate  BMI.  Physical Exam  1. Centrilobular emphysema (HCC)  - Budeson-Glycopyrrol-Formoterol (BREZTRI AEROSPHERE) 160-9-4.8 MCG/ACT AERO; Inhale 2 puffs into the lungs 2 (two) times daily.  Dispense: 10.7 g; Refill: 2 - benzonatate (TESSALON) 100 MG capsule; Take 1-2 capsules (100-200 mg total) by mouth 3 (three) times daily as needed for cough.  Dispense: 60 capsule; Refill: 0 - albuterol (VENTOLIN HFA) 108 (90 Base) MCG/ACT inhaler; Inhale 2 puffs into the lungs every 6 (six) hours as needed for wheezing or shortness of breath.  Dispense: 18 g; Refill: 0  2. Viral illness  - benzonatate (TESSALON) 100 MG capsule; Take 1-2 capsules (100-200 mg total) by mouth 3 (three) times daily as needed for cough.  Dispense: 60 capsule; Refill: 0 - POCT Influenza A/B - Novel Coronavirus, NAA ([facility])   PHQ2/9: Depression screen [facility] 2/9 12/26/2020 11/14/2020 07/12/2020 01/19/2020 01/19/2020  Decreased Interest 0 0 0 0 0  Down, Depressed, Hopeless 0 0 0 0 0  PHQ - 2 Score 0 0 0 0 0  Altered sleeping 0 0 0 0 -  Tired, decreased energy 0 0 0 1 -  Change in appetite 0 0 0 0 -  Feeling bad or failure about yourself  0 0 0 0 -  Trouble concentrating 0 0 0 0 -  Moving slowly or fidgety/restless 0 0 0 0 -  Suicidal thoughts 0 0 0 0 -  PHQ-9 Score 0 0 0 1 -  Difficult doing work/chores - Not difficult at all - Somewhat difficult -  Some recent data might be hidden   PHQ-2/9 Result is negative.    Fall Risk: Fall Risk  12/26/2020 11/14/2020 07/12/2020 01/19/2020 07/20/2019  Falls in the past year? 0 0 0 0 0  Number falls in past yr: 0 0 0 0 0  Injury with Fall? 0 0 0 0 0  Risk for fall due to : No Fall Risks (No Data) - - -  Risk for fall due to: Comment - patient stumbles - - -  Follow up Falls prevention discussed - - - -     Assessment & Plan  1. Centrilobular emphysema (HCC)  - Budeson-Glycopyrrol-Formoterol (BREZTRI AEROSPHERE) 160-9-4.8 MCG/ACT AERO; Inhale 2 puffs into the lungs 2 (two) times  daily.  Dispense: 10.7 g; Refill: 2 - benzonatate (TESSALON)  100 MG capsule; Take 1-2 capsules (100-200 mg total) by mouth 3 (three) times daily as needed for cough.  Dispense: 60 capsule; Refill: 0 - albuterol (VENTOLIN HFA) 108 (90 Base) MCG/ACT inhaler; Inhale 2 puffs into the lungs every 6 (six) hours as needed for wheezing or shortness of breath.  Dispense: 18 g; Refill: 0  2. Viral illness  - benzonatate (TESSALON) 100 MG capsule; Take 1-2 capsules (100-200 mg total) by mouth 3 (three) times daily as needed for cough.  Dispense: 60 capsule; Refill: 0 - POCT Influenza A/B - Novel Coronavirus, NAA ([facility])   I discussed the assessment and treatment plan with the patient. The patient was provided an opportunity to ask questions and all were answered. The patient agreed with the plan and demonstrated an understanding of the instructions.  The patient was advised to call back or seek an in-person evaluation if the symptoms worsen or if the condition fails to improve as anticipated.  I provided 25  minutes of non-face-to-face time during this encounter.

## 2020-12-27 ENCOUNTER — Ambulatory Visit
Admission: RE | Admit: 2020-12-27 | Discharge: 2020-12-27 | Disposition: A | Discharge status: Home / Self Care | Payer: No Typology Code available for payment source | Attending: Family Medicine | Admitting: Family Medicine

## 2020-12-27 ENCOUNTER — Telehealth: Payer: Self-pay

## 2020-12-27 ENCOUNTER — Ambulatory Visit
Admission: RE | Admit: 2020-12-27 | Discharge: 2020-12-27 | Disposition: A | Discharge status: Home / Self Care | Payer: No Typology Code available for payment source | Source: Ambulatory Visit | Attending: Family Medicine | Admitting: Family Medicine

## 2020-12-27 ENCOUNTER — Encounter: Payer: Self-pay | Admitting: Family Medicine

## 2020-12-27 ENCOUNTER — Other Ambulatory Visit: Payer: Self-pay | Admitting: Family Medicine

## 2020-12-27 DIAGNOSIS — R509 Fever, unspecified: Secondary | ICD-10-CM | POA: Insufficient documentation

## 2020-12-27 DIAGNOSIS — J432 Centrilobular emphysema: Secondary | ICD-10-CM | POA: Diagnosis present

## 2020-12-27 NOTE — Telephone Encounter (Signed)
Copied from [place] [phone]. Topic: General - Other >> Dec 27, 2020  9:02 AM Parke Poisson wrote: Reason for CRM: Pt was senn in office 12/26/20. She states she was under the impression that if flu test came back negative you would call in an antibiotic. She would like this sent to [id number] - [place], [place] - [address]  [address], [place] [address]  Phone:  [phone] Fax:  [phone]  DEA #:  [id number]

## 2020-12-28 LAB — NOVEL CORONAVIRUS, NAA: SARS-CoV-2, NAA: NOT DETECTED

## 2020-12-28 LAB — SARS-COV-2, NAA 2 DAY TAT

## 2020-12-28 NOTE — Telephone Encounter (Signed)
completed

## 2021-01-04 ENCOUNTER — Other Ambulatory Visit: Payer: Self-pay | Admitting: Family Medicine

## 2021-01-04 DIAGNOSIS — E7800 Pure hypercholesterolemia, unspecified: Secondary | ICD-10-CM

## 2021-01-04 DIAGNOSIS — I700 Atherosclerosis of aorta: Secondary | ICD-10-CM

## 2021-01-04 DIAGNOSIS — I10 Essential (primary) hypertension: Secondary | ICD-10-CM

## 2021-01-05 NOTE — Telephone Encounter (Signed)
Requested Prescriptions  Pending Prescriptions Disp Refills  . amLODipine (NORVASC) 10 MG tablet [Pharmacy Med Name: AMLODIPINE BESYLATE 10 MG TAB] 90 tablet 0    Sig: TAKE 1 TABLET BY MOUTH EVERY DAY     Cardiovascular:  Calcium Channel Blockers Failed - 01/04/2021  6:53 PM      Failed - Last BP in normal range    BP Readings from Last 1 Encounters:  11/14/20 (!) 160/71         Passed - Valid encounter within last 6 months    Recent Outpatient Visits          1 week ago Centrilobular emphysema [facility])   [facility] Steele Sizer, MD   5 months ago Hyperglycemia   [facility] Steele Sizer, MD   11 months ago Thoracic aorta atherosclerosis [facility])   [facility] Steele Sizer, MD   1 year ago Thoracic aorta atherosclerosis [facility])   [facility] Steele Sizer, MD   1 year ago Recurrent major depressive disorder, in remission [facility])   [facility] Steele Sizer, MD      Future Appointments            In 1 week Steele Sizer, MD [facility], [place]   In 2 months Steele Sizer, MD [facility], [place]           . rosuvastatin (CRESTOR) 40 MG tablet [Pharmacy Med Name: ROSUVASTATIN CALCIUM 40 MG TAB] 90 tablet 0    Sig: TAKE 1 TABLET BY MOUTH EVERY DAY     Cardiovascular:  Antilipid - Statins Failed - 01/04/2021  6:53 PM      Failed - LDL in normal range and within 360 days    LDL Cholesterol (Calc)  Date Value Ref Range Status  07/12/2020 119 (H) mg/dL (calc) Final    Comment:    Reference range: <100 . Desirable range <100 mg/dL for primary prevention;   <70 mg/dL for patients with CHD or diabetic patients  with > or = 2 CHD risk factors. Marland Kitchen LDL-C is now calculated using the Martin-Hopkins  calculation, which is a validated novel method providing  better accuracy than the Friedewald equation in the  estimation of LDL-C.  Cresenciano Genre et al. Annamaria Helling. 2703;500(93): 2061-2068  (http://education.[facility].com/faq/FAQ164)          Passed - Total Cholesterol in normal range and within 360 days    Cholesterol, Total  Date Value Ref Range Status  04/10/2015 166 100 - 199 mg/dL Final   Cholesterol  Date Value Ref Range Status  07/12/2020 195 <200 mg/dL Final         Passed - HDL in normal range and within 360 days    HDL  Date Value Ref Range Status  07/12/2020 53 > OR = 50 mg/dL Final  04/10/2015 53 >39 mg/dL Final         Passed - Triglycerides in normal range and within 360 days    Triglycerides  Date Value Ref Range Status  07/12/2020 123 <150 mg/dL Final         Passed - Patient is not pregnant      Passed - Valid encounter within last 12 months    Recent Outpatient Visits          1 week ago Centrilobular emphysema [facility])   [facility] Steele Sizer, MD   5 months ago Hyperglycemia   [facility]  [facility] Steele Sizer, MD   11 months ago Thoracic aorta atherosclerosis [facility])   [facility] Steele Sizer, MD   1 year ago Thoracic aorta atherosclerosis [facility])   [facility] Steele Sizer, MD   1 year ago Recurrent major depressive disorder, in remission [facility])   [facility] Steele Sizer, MD      Future Appointments            In 1 week Steele Sizer, MD [facility], [place]   In 2 months Steele Sizer, MD [facility], [facility]           . hydrochlorothiazide (HYDRODIURIL) 12.5 MG tablet [Pharmacy Med Name: HYDROCHLOROTHIAZIDE 12.5 MG TB] 90 tablet 0    Sig: TAKE 1 TABLET BY MOUTH EVERY DAY     Cardiovascular: Diuretics - Thiazide Failed - 01/04/2021  6:53 PM      Failed - Last BP in normal range    BP Readings from Last 1 Encounters:  11/14/20 (!) 160/71         Passed - Ca in normal range and within 360 days    Calcium  Date Value Ref Range Status   11/14/2020 9.9 8.7 - 10.2 mg/dL Final   Calcium, Total  Date Value Ref Range Status  04/27/2012 8.6 8.5 - 10.1 mg/dL Final         Passed - Cr in normal range and within 360 days    Creat  Date Value Ref Range Status  07/12/2020 0.51 0.50 - 1.05 mg/dL Final    Comment:    For patients >36 years of age, the reference limit for Creatinine is approximately 13% higher for people identified as African-American. .    Creatinine, Ser  Date Value Ref Range Status  11/14/2020 0.58 0.57 - 1.00 mg/dL Final         Passed - K in normal range and within 360 days    Potassium  Date Value Ref Range Status  11/14/2020 4.5 3.5 - 5.2 mmol/L Final  04/27/2012 3.3 (L) 3.5 - 5.1 mmol/L Final         Passed - Na in normal range and within 360 days    Sodium  Date Value Ref Range Status  11/14/2020 140 134 - 144 mmol/L Final  04/27/2012 136 136 - 145 mmol/L Final         Passed - Valid encounter within last 6 months    Recent Outpatient Visits          1 week ago Centrilobular emphysema [facility])   [facility] Steele Sizer, MD   5 months ago Hyperglycemia   [facility] Steele Sizer, MD   11 months ago Thoracic aorta atherosclerosis [facility])   [facility] Steele Sizer, MD   1 year ago Thoracic aorta atherosclerosis [facility])   [facility] Steele Sizer, MD   1 year ago Recurrent major depressive disorder, in remission [facility])   [facility] Steele Sizer, MD      Future Appointments            In 1 week Steele Sizer, MD [facility], [place]   In 2 months Steele Sizer, MD [facility], [facility]

## 2021-01-11 NOTE — Progress Notes (Deleted)
Name: Nichole Wells   MRN: [id number]    DOB: Sep 23, 1960   Date:01/11/2021       Progress Note  Subjective  Chief Complaint  Follow Up  HPI  Emphysema with COPD flare :  CT scan chest reviewed. She has some sob, daily cough also has wheezing, she has been out of Trelegy and would like to resume medicaiton  . She has a history of smoking 1 pack daily for over 30 years   Inflammatory arthritis: she missed follow up with Dr. Jefm Bryant , she has been off Methotrexate for over 18 months  she has stiffness and pain on both hands . She still has Raynaud's , she had a gap in insurance and stopped taking medications again. Explained medication should be at the pharmacy and needs to contact him back   HTN: taking medication and bp is at goal, no side effects. She denies chest pain or palpitation, she has some dizziness when she stands quickly and not drinking enough water  . She has a history of MVP but has not seen a cardiologist in many years     Hyperlipidemia/Aorta atherosclerosis: she is now on Crestor and aspirin daily now.  Atherosclerosis found on CT chest. Last LDL was at goal at 65, we will recheck labs today    Major Depression: she was diagnosed in her 60's, she has been taking Prozac for many years, tried other medication in the past. Initially seen by psychiatrist and had therapy in the past . Still in remission and doing well. She does not want to stop medication because she had severe episode that required hospitalization in her 48's for suicide thoughts. She is doing well    Hyperglycemia: she denies polyphagia, polydipsia or polyuria. last A1C was normal , it was as high as 5.8 % in the past.     Low back pain with sciatica: She has a history of herniated disc and DDD, never had surgery but had injections in the past. She saw me last in August and was having a back pain flare - she had worked 3 weeks without a break. We referred her to [facility], X-ray showed spondylosis lumbar spine.  She was given medrol dose pack and methocarbamol. She states pain improved temporarily . She continues to have daily pain in the mid lower back no radiation, she did not like going to [facility], she would like to go back for steroid injections  Multiple polyps: she missed her colonoscopy because of gap in insurance but will contact them back to re-schedule it   Patient Active Problem List   Diagnosis Date Noted   Chronic pain syndrome 11/14/2020   Pharmacologic therapy 11/14/2020   Disorder of skeletal system 11/14/2020   Problems influencing health status 11/14/2020   Chronic low back pain (1ry area of Pain) (Bilateral) (L>R) w/o sciatica 11/14/2020   Lumbar facet joint syndrome (Bilateral) 11/14/2020   Chronic wrist pain (2ry area of Pain) (Right) 11/14/2020   History of carpal tunnel surgery of wrist (Right) 11/14/2020   Chronic neck pain (3ry area of Pain) (Bilateral) (L>R) 11/14/2020   Cervicalgia 11/14/2020   Cervical facet syndrome 11/14/2020   Chronic hip pain (4th area of Pain) (Bilateral) 11/14/2020   Recurrent major depressive disorder, in remission ([place]) 02/18/2018   Thoracic aorta atherosclerosis ([place]) 11/10/2017   Centrilobular emphysema ([place]) 11/10/2017   Abnormal liver CT 11/10/2017   Inflammatory arthritis 05/07/2017   Current tobacco use 05/07/2017   Vitamin D deficiency 05/28/2016  Surgical menopause on hormone replacement therapy 04/23/2016   Status post laparoscopic assisted vaginal hysterectomy (LAVH) 01/28/2016   Family history of ovarian cancer 11/13/2015   Insomnia 03/27/2015   Hypertension 08/30/2014   Anxiety and depression 08/30/2014   Cardiac murmur 08/30/2014   HLD (hyperlipidemia) 08/30/2014   DDD (degenerative disc disease), lumbar 07/26/2014    Past Surgical History:  Procedure Laterality Date   ABDOMINAL HYSTERECTOMY     BREAST BIOPSY Left    stereo- neg   BREAST CYST ASPIRATION Right    neg   BREAST SURGERY     benign biopsy    CHOLECYSTECTOMY     COLONOSCOPY WITH PROPOFOL N/A 10/11/2019   Procedure: COLONOSCOPY WITH PROPOFOL;  Surgeon: [name], MD;  Location[facility] ENDOSCOPY;  Service: Gastroenterology;  Laterality: N/A;   DILATION AND CURETTAGE OF UTERUS     LAPAROSCOPIC LYSIS OF ADHESIONS  12/10/2015   Procedure: LAPAROSCOPIC LYSIS OF ADHESIONS;  Surgeon: Brayton Mars, MD;  Location: [facility] ORS;  Service: Gynecology;;   LAPAROSCOPIC VAGINAL HYSTERECTOMY WITH SALPINGO OOPHORECTOMY Bilateral 01/28/2016   Procedure: LAPAROSCOPIC ASSISTED VAGINAL HYSTERECTOMY WITH SALPINGO OOPHORECTOMY;  Surgeon: Brayton Mars, MD;  Location: [facility] ORS;  Service: Gynecology;  Laterality: Bilateral;   LAPAROSCOPY N/A 12/10/2015   Procedure: LAPAROSCOPY DIAGNOSTIC WITH BIOPSIES;  Surgeon: Brayton Mars, MD;  Location: [facility] ORS;  Service: Gynecology;  Laterality: N/A;   SHOULDER ARTHROSCOPY Right    TUBAL LIGATION     UTERINE FIBROID EMBOLIZATION      Family History  Problem Relation Age of Onset   Cancer Mother        cervical, lung   Lung cancer Mother    Heart disease Father    Hypertension Father    COPD Father    Breast cancer Maternal Aunt    Ovarian cancer Maternal 86    Spina bifida Sister    Diabetes Son    Diabetes Maternal Grandmother    Heart disease Maternal Grandmother    Parkinson's disease Paternal Grandfather     Social History   Tobacco Use   Smoking status: Every Day    Packs/day: 1.00    Years: 37.00    Pack years: 37.00    Types: Cigarettes    Start date: 10/06/1987   Smokeless tobacco: Never  Substance Use Topics   Alcohol use: Yes    Alcohol/week: 0.0 standard drinks    Comment: none last 24hr     Current Outpatient Medications:    albuterol (VENTOLIN HFA) 108 (90 Base) MCG/ACT inhaler, Inhale 2 puffs into the lungs every 6 (six) hours as needed for wheezing or shortness of breath., Disp: 18 g, Rfl: 0   amLODipine (NORVASC) 10 MG tablet, TAKE 1 TABLET BY MOUTH EVERY  DAY, Disp: 90 tablet, Rfl: 0   aspirin 81 MG EC tablet, TAKE 1 TABLET BY MOUTH EVERY DAY, Disp: 30 tablet, Rfl: 5   benzonatate (TESSALON) 100 MG capsule, Take 1-2 capsules (100-200 mg total) by mouth 3 (three) times daily as needed for cough., Disp: 60 capsule, Rfl: 0   Budeson-Glycopyrrol-Formoterol (BREZTRI AEROSPHERE) 160-9-4.8 MCG/ACT AERO, Inhale 2 puffs into the lungs 2 (two) times daily., Disp: 10.7 g, Rfl: 2   cetirizine (ZYRTEC) 10 MG tablet, Take 10 mg by mouth daily., Disp: , Rfl:    FLUoxetine (PROZAC) 40 MG capsule, TAKE 1 CAPSULE BY MOUTH EVERY DAY, Disp: 90 capsule, Rfl: 1   fluticasone (FLONASE) 50 MCG/ACT nasal spray, SPRAY 2 SPRAYS INTO EACH NOSTRIL EVERY  DAY, Disp: 48 mL, Rfl: 2   gabapentin (NEURONTIN) 100 MG capsule, Take 1 capsule (100 mg total) by mouth at bedtime., Disp: 90 capsule, Rfl: 1   hydrochlorothiazide (HYDRODIURIL) 12.5 MG tablet, TAKE 1 TABLET BY MOUTH EVERY DAY, Disp: 90 tablet, Rfl: 0   metoprolol succinate (TOPROL-XL) 50 MG 24 hr tablet, Take 1 tablet (50 mg total) by mouth daily. Take with or immediately following a meal., Disp: 90 tablet, Rfl: 1   rosuvastatin (CRESTOR) 40 MG tablet, TAKE 1 TABLET BY MOUTH EVERY DAY, Disp: 90 tablet, Rfl: 0   Vitamin D, Cholecalciferol, 50 MCG (2000 UT) CAPS, Take by mouth daily., Disp: , Rfl:   Allergies  Allergen Reactions   Hydroxychloroquine Itching    Pt take zyrtec to alleviate symptoms    Penicillin G Hives    Has patient had a PCN reaction causing immediate rash, facial/tongue/throat swelling, SOB or lightheadedness with hypotension:unsure Has patient had a PCN reaction causing severe rash involving mucus membranes or skin necrosis:unsure Has patient had a PCN reaction that required hospitalization:unsure Has patient had a PCN reaction occurring within the last 10 years:No If all of the above answers are "NO", then may proceed with Cephalosporin use.     I personally reviewed active problem list, medication  list, allergies, family history, social history, health maintenance with the patient/caregiver today.   ROS  ***  Objective  There were no vitals filed for this visit.  There is no height or weight on file to calculate BMI.  Physical Exam ***  Recent Results (from the past 2160 hour(s))  Compliance Drug Analysis, Ur     Status: None   Collection Time: 11/14/20  9:44 AM  Result Value Ref Range   Summary Note     Comment: ==================================================================== Compliance Drug Analysis, Ur ==================================================================== Test                             Result       Flag       Units  Drug Present   Fluoxetine                     PRESENT   Norfluoxetine                  PRESENT    Norfluoxetine is an expected metabolite of fluoxetine.    Naproxen                       PRESENT   Metoprolol                     PRESENT ==================================================================== Test                      Result    Flag   Units      Ref Range   Creatinine              63               mg/dL      >=20 ==================================================================== Declared Medications:  Medication list was not provided. ==================================================================== For clinical consultation, please call [phone]. ====================================================================   Comp. Metabolic Panel (12)     Status: Abnormal   Collection Time: 11/14/20  3:01 PM  Result Value Ref Range   Glucose 100 (H) 70 - 99 mg/dL    Comment:               **  Please note reference interval change**   BUN 13 6 - 24 mg/dL   Creatinine, Ser 0.58 0.57 - 1.00 mg/dL   eGFR 105 >59 mL/min/1.73   BUN/Creatinine Ratio 22 9 - 23   Sodium 140 134 - 144 mmol/L   Potassium 4.5 3.5 - 5.2 mmol/L   Chloride 104 96 - 106 mmol/L   Calcium 9.9 8.7 - 10.2 mg/dL   Total Protein 6.7 6.0 - 8.5  g/dL   Albumin 4.4 3.8 - 4.9 g/dL   Globulin, Total 2.3 1.5 - 4.5 g/dL   Albumin/Globulin Ratio 1.9 1.2 - 2.2   Bilirubin Total <0.2 0.0 - 1.2 mg/dL   Alkaline Phosphatase 109 44 - 121 IU/L   AST 26 0 - 40 IU/L  Magnesium     Status: None   Collection Time: 11/14/20  3:01 PM  Result Value Ref Range   Magnesium 1.9 1.6 - 2.3 mg/dL  Vitamin B12     Status: None   Collection Time: 11/14/20  3:01 PM  Result Value Ref Range   Vitamin B-12 274 232 - 1,245 pg/mL  Sedimentation rate     Status: None   Collection Time: 11/14/20  3:01 PM  Result Value Ref Range   Sed Rate 14 0 - 40 mm/hr  25-Hydroxy vitamin D Lcms D2+D3     Status: None   Collection Time: 11/14/20  3:01 PM  Result Value Ref Range   25-Hydroxy, Vitamin D 47 ng/mL    Comment: Reference Range: All Ages: Target levels 30 - 100    25-Hydroxy, Vitamin D-2 <1.0 ng/mL    Comment: This test was developed and its performance characteristics determined by [facility]. It has not been cleared or approved by the Food and Drug Administration.    25-Hydroxy, Vitamin D-3 46 ng/mL    Comment: This test was developed and its performance characteristics determined by [facility]. It has not been cleared or approved by the Food and Drug Administration.   C-reactive protein     Status: None   Collection Time: 11/14/20  3:01 PM  Result Value Ref Range   CRP 2 0 - 10 mg/L  Novel Coronavirus, NAA ([facility])     Status: None   Collection Time: 12/26/20 12:00 AM   Specimen: Nasopharyngeal(NP) swabs in vial transport medium   Nasopharynge  Previous  Result Value Ref Range   SARS-CoV-2, NAA Not Detected Not Detected    Comment: This nucleic acid amplification test was developed and its performance characteristics determined by [employer]. Nucleic acid amplification tests include RT-PCR and TMA. This test has not been FDA cleared or approved. This test has been authorized by FDA under an Emergency Use Authorization (EUA). This test is  only authorized for the duration of time the declaration that circumstances exist justifying the authorization of the emergency use of in vitro diagnostic tests for detection of SARS-CoV-2 virus and/or diagnosis of COVID-19 infection under section 564(b)(1) of the Act, 21 U.S.C. 034VQQ-5(Z) (1), unless the authorization is terminated or revoked sooner. When diagnostic testing is negative, the possibility of a false negative result should be considered in the context of a patient's recent exposures and the presence of clinical signs and symptoms consistent with COVID-19. An individual without symptoms of COVID-19 and who is not shedding SARS-CoV-2 virus wo uld expect to have a negative (not detected) result in this assay.   SARS-COV-2, NAA 2 DAY TAT     Status: None   Collection Time: 12/26/20 12:00 AM  Nasopharynge  Previous  Result Value Ref Range   SARS-CoV-2, NAA 2 DAY TAT Performed   POCT Influenza A/B     Status: Normal   Collection Time: 12/26/20  9:22 AM  Result Value Ref Range   Influenza A, POC Negative Negative   Influenza B, POC Negative Negative     PHQ2/9: Depression screen [facility] 2/9 12/26/2020 11/14/2020 07/12/2020 01/19/2020 01/19/2020  Decreased Interest 0 0 0 0 0  Down, Depressed, Hopeless 0 0 0 0 0  PHQ - 2 Score 0 0 0 0 0  Altered sleeping 0 0 0 0 -  Tired, decreased energy 0 0 0 1 -  Change in appetite 0 0 0 0 -  Feeling bad or failure about yourself  0 0 0 0 -  Trouble concentrating 0 0 0 0 -  Moving slowly or fidgety/restless 0 0 0 0 -  Suicidal thoughts 0 0 0 0 -  PHQ-9 Score 0 0 0 1 -  Difficult doing work/chores - Not difficult at all - Somewhat difficult -  Some recent data might be hidden    phq 9 is {gen pos [id number]}   Fall Risk: Fall Risk  12/26/2020 11/14/2020 07/12/2020 01/19/2020 07/20/2019  Falls in the past year? 0 0 0 0 0  Number falls in past yr: 0 0 0 0 0  Injury with Fall? 0 0 0 0 0  Risk for fall due to : No Fall Risks (No Data) - - -   Risk for fall due to: Comment - patient stumbles - - -  Follow up Falls prevention discussed - - - -      Functional Status Survey:      Assessment & Plan  *** There are no diagnoses linked to this encounter.

## 2021-01-13 NOTE — Progress Notes (Deleted)
No-show to second visit

## 2021-01-14 ENCOUNTER — Ambulatory Visit: Payer: 59 | Admitting: Family Medicine

## 2021-01-14 ENCOUNTER — Ambulatory Visit: Payer: No Typology Code available for payment source | Admitting: Pain Medicine

## 2021-01-14 DIAGNOSIS — M19031 Primary osteoarthritis, right wrist: Secondary | ICD-10-CM | POA: Insufficient documentation

## 2021-01-14 DIAGNOSIS — M47817 Spondylosis without myelopathy or radiculopathy, lumbosacral region: Secondary | ICD-10-CM | POA: Insufficient documentation

## 2021-01-14 DIAGNOSIS — M1811 Unilateral primary osteoarthritis of first carpometacarpal joint, right hand: Secondary | ICD-10-CM | POA: Insufficient documentation

## 2021-01-14 DIAGNOSIS — M4310 Spondylolisthesis, site unspecified: Secondary | ICD-10-CM | POA: Insufficient documentation

## 2021-01-14 DIAGNOSIS — M47812 Spondylosis without myelopathy or radiculopathy, cervical region: Secondary | ICD-10-CM | POA: Insufficient documentation

## 2021-01-14 DIAGNOSIS — M160 Bilateral primary osteoarthritis of hip: Secondary | ICD-10-CM | POA: Insufficient documentation

## 2021-01-14 DIAGNOSIS — M5030 Other cervical disc degeneration, unspecified cervical region: Secondary | ICD-10-CM | POA: Insufficient documentation

## 2021-01-14 DIAGNOSIS — M4312 Spondylolisthesis, cervical region: Secondary | ICD-10-CM | POA: Insufficient documentation

## 2021-01-14 NOTE — Patient Instructions (Incomplete)
______________________________________________________________________  Preparing for Procedure with Sedation  NOTICE: Due to recent regulatory changes, starting on September 10, 2020, procedures requiring intravenous (IV) sedation will no longer be performed at the [facility].  These types of procedures are required to be performed at [facility] ambulatory surgery facility.  We are very sorry for the inconvenience.  Procedure appointments are limited to planned procedures: No Prescription Refills. No disability issues will be discussed. No medication changes will be discussed.  Instructions: Oral Intake: Do not eat or drink anything for at least 8 hours prior to your procedure. (Exception: Blood Pressure Medication. See below.) Transportation: A driver is required. You may not drive yourself after the procedure. Blood Pressure Medicine: Do not forget to take your blood pressure medicine with a sip of water the morning of the procedure. If your Diastolic (lower reading) is above 100 mmHg, elective cases will be cancelled/rescheduled. Blood thinners: These will need to be stopped for procedures. Notify our staff if you are taking any blood thinners. Depending on which one you take, there will be specific instructions on how and when to stop it. Diabetics on insulin: Notify the staff so that you can be scheduled 1st case in the morning. If your diabetes requires high dose insulin, take only  of your normal insulin dose the morning of the procedure and notify the staff that you have done so. Preventing infections: Shower with an antibacterial soap the morning of your procedure. Build-up your immune system: Take 1000 mg of Vitamin C with every meal (3 times a day) the day prior to your procedure. Antibiotics: Inform the staff if you have a condition or reason that requires you to take antibiotics before dental procedures. Pregnancy: If you are pregnant, call and cancel the procedure. Sickness: If  you have a cold, fever, or any active infections, call and cancel the procedure. Arrival: You must be in the facility at least 30 minutes prior to your scheduled procedure. Children: Do not bring children with you. Dress appropriately: Bring dark clothing that you would not mind if they get stained. Valuables: Do not bring any jewelry or valuables.  Reasons to call and reschedule or cancel your procedure: (Following these recommendations will minimize the risk of a serious complication.) Surgeries: Avoid having procedures within 2 weeks of any surgery. (Avoid for 2 weeks before or after any surgery). Flu Shots: Avoid having procedures within 2 weeks of a flu shots. (Avoid for 2 weeks before or after immunizations). Barium: Avoid having a procedure within 7-10 days after having had a radiological study involving the use of radiological contrast. (Myelograms, Barium swallow or enema study). Heart attacks: Avoid any elective procedures or surgeries for the initial 6 months after a "Myocardial Infarction" (Heart Attack). Blood thinners: It is imperative that you stop these medications before procedures. Let us know if you if you take any blood thinner.  Infection: Avoid procedures during or within two weeks of an infection (including chest colds or gastrointestinal problems). Symptoms associated with infections include: Localized redness, fever, chills, night sweats or profuse sweating, burning sensation when voiding, cough, congestion, stuffiness, runny nose, sore throat, diarrhea, nausea, vomiting, cold or Flu symptoms, recent or current infections. It is specially important if the infection is over the area that we intend to treat. Heart and lung problems: Symptoms that may suggest an active cardiopulmonary problem include: cough, chest pain, breathing difficulties or shortness of breath, dizziness, ankle swelling, uncontrolled high or unusually low blood pressure, and/or palpitations. If you are    experiencing any of these symptoms, cancel your procedure and contact your primary care physician for an evaluation.  Remember:  Regular Business hours are:  Monday to Thursday 8:00 AM to 4:00 PM  Provider's Schedule: Petro Talent, MD:  Procedure days: Tuesday and Thursday 7:30 AM to 4:00 PM  Bilal Lateef, MD:  Procedure days: Monday and Wednesday 7:30 AM to 4:00 PM ______________________________________________________________________  ____________________________________________________________________________________________  General Risks and Possible Complications  Patient Responsibilities: It is important that you read this as it is part of your informed consent. It is our duty to inform you of the risks and possible complications associated with treatments offered to you. It is your responsibility as a patient to read this and to ask questions about anything that is not clear or that you believe was not covered in this document.  Patient's Rights: You have the right to refuse treatment. You also have the right to change your mind, even after initially having agreed to have the treatment done. However, under this last option, if you wait until the last second to change your mind, you may be charged for the materials used up to that point.  Introduction: Medicine is not an exact science. Everything in Medicine, including the lack of treatment(s), carries the potential for danger, harm, or loss (which is by definition: Risk). In Medicine, a complication is a secondary problem, condition, or disease that can aggravate an already existing one. All treatments carry the risk of possible complications. The fact that a side effects or complications occurs, does not imply that the treatment was conducted incorrectly. It must be clearly understood that these can happen even when everything is done following the highest safety standards.  No treatment: You can choose not to proceed with the  proposed treatment alternative. The "PRO(s)" would include: avoiding the risk of complications associated with the therapy. The "CON(s)" would include: not getting any of the treatment benefits. These benefits fall under one of three categories: diagnostic; therapeutic; and/or palliative. Diagnostic benefits include: getting information which can ultimately lead to improvement of the disease or symptom(s). Therapeutic benefits are those associated with the successful treatment of the disease. Finally, palliative benefits are those related to the decrease of the primary symptoms, without necessarily curing the condition (example: decreasing the pain from a flare-up of a chronic condition, such as incurable terminal cancer).  General Risks and Complications: These are associated to most interventional treatments. They can occur alone, or in combination. They fall under one of the following six (6) categories: no benefit or worsening of symptoms; bleeding; infection; nerve damage; allergic reactions; and/or death. No benefits or worsening of symptoms: In Medicine there are no guarantees, only probabilities. No healthcare provider can ever guarantee that a medical treatment will work, they can only state the probability that it may. Furthermore, there is always the possibility that the condition may worsen, either directly, or indirectly, as a consequence of the treatment. Bleeding: This is more common if the patient is taking a blood thinner, either prescription or over the counter (example: Goody Powders, Fish oil, Aspirin, Garlic, etc.), or if suffering a condition associated with impaired coagulation (example: Hemophilia, cirrhosis of the liver, low platelet counts, etc.). However, even if you do not have one on these, it can still happen. If you have any of these conditions, or take one of these drugs, make sure to notify your treating physician. Infection: This is more common in patients with a compromised  immune system, either due to disease (example:   diabetes, cancer, human immunodeficiency virus [HIV], etc.), or due to medications or treatments (example: therapies used to treat cancer and rheumatological diseases). However, even if you do not have one on these, it can still happen. If you have any of these conditions, or take one of these drugs, make sure to notify your treating physician. Nerve Damage: This is more common when the treatment is an invasive one, but it can also happen with the use of medications, such as those used in the treatment of cancer. The damage can occur to small secondary nerves, or to large primary ones, such as those in the spinal cord and brain. This damage may be temporary or permanent and it may lead to impairments that can range from temporary numbness to permanent paralysis and/or brain death. Allergic Reactions: Any time a substance or material comes in contact with our body, there is the possibility of an allergic reaction. These can range from a mild skin rash (contact dermatitis) to a severe systemic reaction (anaphylactic reaction), which can result in death. Death: In general, any medical intervention can result in death, most of the time due to an unforeseen complication. ____________________________________________________________________________________________  

## 2021-01-17 ENCOUNTER — Other Ambulatory Visit: Payer: Self-pay | Admitting: Family Medicine

## 2021-01-17 DIAGNOSIS — J432 Centrilobular emphysema: Secondary | ICD-10-CM

## 2021-01-17 NOTE — Telephone Encounter (Signed)
Requested Prescriptions  Pending Prescriptions Disp Refills  . albuterol (VENTOLIN HFA) 108 (90 Base) MCG/ACT inhaler [Pharmacy Med Name: ALBUTEROL HFA (PROAIR) INHALER] 8.5 each 1    Sig: TAKE 2 PUFFS BY MOUTH EVERY 6 HOURS AS NEEDED FOR WHEEZE OR SHORTNESS OF BREATH     Pulmonology:  Beta Agonists Failed - 01/17/2021  3:52 AM      Failed - One inhaler should last at least one month. If the patient is requesting refills earlier, contact the patient to check for uncontrolled symptoms.      Passed - Valid encounter within last 12 months    Recent Outpatient Visits          3 weeks ago Centrilobular emphysema [facility])   [facility] Steele Sizer, MD   6 months ago Hyperglycemia   [facility] Steele Sizer, MD   12 months ago Thoracic aorta atherosclerosis [facility])   [facility] Steele Sizer, MD   1 year ago Thoracic aorta atherosclerosis [facility])   [facility] Steele Sizer, MD   2 years ago Recurrent major depressive disorder, in remission [facility])   [facility] Steele Sizer, MD      Future Appointments            In 3 weeks Steele Sizer, MD [facility] [place]   In 2 months Steele Sizer, MD [facility]

## 2021-01-22 NOTE — Progress Notes (Signed)
Name: Nichole Wells   MRN: [id number]    DOB: 10/17/1960   Date:01/23/2021       Progress Note  Subjective  Chief Complaint  Cough/ Discharge  HPI   Emphysema with COPD flare :  CT scan chest reviewed. She has some sob, daily cough also has wheezing, she was on Trelegy but not covered by insurance. I gave her Verl Blalock but she did not get a voucher.  She has a history of smoking 1 pack daily for over 30 years. She is smoking only 6 cigarettes daily    Inflammatory arthritis: she missed follow up with Dr. Jefm Bryant , she has been off Methotrexate for over 2 years, he has stiffness and pain on both hands . She still has Raynaud's . Advised her to go back    HTN: taking medication and bp is at goal, no side effects. She denies chest pain or palpitation,dizziness resolved.  She has a history of MVP but has not seen a cardiologist in many years     Hyperlipidemia/Aorta atherosclerosis:Atherosclerosis found on CT chest. Last LDL was up again, but she was not compliant with crestor at the time, but has been since the Summer, recheck levels yearly    Major Depression: she was diagnosed in her 70's, she has been taking Prozac for many years, tried other medication in the past. Initially seen by psychiatrist and had therapy in the past . Still in remission and doing well. She does not want to stop medication because she had severe episode that required hospitalization in her 21's for suicide thoughts. Unchanged    Hyperglycemia: she denies polyphagia, polydipsia or polyuria. last A1C was normal , it was as high as 5.8 % in the past.  Stable    Low back pain with sciatica: She has a history of herniated disc and DDD, never had surgery but had injections in the past. She saw me last in August and was having a back pain flare - she had worked 3 weeks without a break. We referred her to [facility], X-ray showed spondylosis lumbar spine. She was given medrol dose pack and methocarbamol. She states pain  improved temporarily . She continues to have daily pain in the mid lower back no radiation. She was going to have injections again but missed appointment and it was not re-scheduled    Multiple polyps: she missed her colonoscopy because of gap in insurance but will contact them back to re-schedule it . Reminded her again   Patient Active Problem List   Diagnosis Date Noted   DDD (degenerative disc disease), cervical (Multilevel) 01/14/2021   Cervical facet arthropathy (Multilevel) (Bilateral) 01/14/2021   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 01/14/2021   Retrolisthesis of lumbar spine at L1/L2 and L2/L3 01/14/2021   Lumbosacral facet arthropathy (Multilevel) (Bilateral) 01/14/2021   Osteoarthritis of hips (Bilateral) 01/14/2021   Osteoarthritis of wrist (Right) 01/14/2021   Osteoarthritis of first carpometacarpal joint of hand (Right) 01/14/2021   Chronic pain syndrome 11/14/2020   Pharmacologic therapy 11/14/2020   Disorder of skeletal system 11/14/2020   Problems influencing health status 11/14/2020   Chronic low back pain (1ry area of Pain) (Bilateral) (L>R) w/o sciatica 11/14/2020   Lumbosacral facet syndrome (Bilateral) 11/14/2020   Chronic wrist pain (2ry area of Pain) (Right) 11/14/2020   History of carpal tunnel surgery of wrist (Right) 11/14/2020   Chronic neck pain (3ry area of Pain) (Bilateral) (L>R) 11/14/2020   Cervicalgia 11/14/2020   Cervical facet syndrome 11/14/2020  Chronic hip pain (4th area of Pain) (Bilateral) 11/14/2020   Recurrent major depressive disorder, in remission ([place]) 02/18/2018   Thoracic aorta atherosclerosis ([place]) 11/10/2017   Centrilobular emphysema ([place]) 11/10/2017   Abnormal liver CT 11/10/2017   Inflammatory arthritis 05/07/2017   Current tobacco use 05/07/2017   Vitamin D deficiency 05/28/2016   Surgical menopause on hormone replacement therapy 04/23/2016   Status post laparoscopic assisted vaginal hysterectomy (LAVH)  01/28/2016   Family history of ovarian cancer 11/13/2015   Insomnia 03/27/2015   Hypertension 08/30/2014   Anxiety and depression 08/30/2014   Cardiac murmur 08/30/2014   HLD (hyperlipidemia) 08/30/2014   DDD (degenerative disc disease), lumbar 07/26/2014    Past Surgical History:  Procedure Laterality Date   ABDOMINAL HYSTERECTOMY     BREAST BIOPSY Left    stereo- neg   BREAST CYST ASPIRATION Right    neg   BREAST SURGERY     benign biopsy   CHOLECYSTECTOMY     COLONOSCOPY WITH PROPOFOL N/A 10/11/2019   Procedure: COLONOSCOPY WITH PROPOFOL;  Surgeon: [name], MD;  Location: [facility] ENDOSCOPY;  Service: Gastroenterology;  Laterality: N/A;   DILATION AND CURETTAGE OF UTERUS     LAPAROSCOPIC LYSIS OF ADHESIONS  12/10/2015   Procedure: LAPAROSCOPIC LYSIS OF ADHESIONS;  Surgeon: Brayton Mars, MD;  Location: [facility] ORS;  Service: Gynecology;;   LAPAROSCOPIC VAGINAL HYSTERECTOMY WITH SALPINGO OOPHORECTOMY Bilateral 01/28/2016   Procedure: LAPAROSCOPIC ASSISTED VAGINAL HYSTERECTOMY WITH SALPINGO OOPHORECTOMY;  Surgeon: Brayton Mars, MD;  Location: [facility] ORS;  Service: Gynecology;  Laterality: Bilateral;   LAPAROSCOPY N/A 12/10/2015   Procedure: LAPAROSCOPY DIAGNOSTIC WITH BIOPSIES;  Surgeon: Brayton Mars, MD;  Location: [facility] ORS;  Service: Gynecology;  Laterality: N/A;   SHOULDER ARTHROSCOPY Right    TUBAL LIGATION     UTERINE FIBROID EMBOLIZATION      Family History  Problem Relation Age of Onset   Cancer Mother        cervical, lung   Lung cancer Mother    Heart disease Father    Hypertension Father    COPD Father    Breast cancer Maternal Aunt    Ovarian cancer Maternal 27    Spina bifida Sister    Diabetes Son    Diabetes Maternal Grandmother    Heart disease Maternal Grandmother    Parkinson's disease Paternal Grandfather     Social History   Tobacco Use   Smoking status: Every Day    Packs/day: 1.00    Years: 37.00    Pack years: 37.00     Types: Cigarettes    Start date: 10/06/1987   Smokeless tobacco: Never  Substance Use Topics   Alcohol use: Yes    Alcohol/week: 0.0 standard drinks    Comment: none last 24hr     Current Outpatient Medications:    albuterol (VENTOLIN HFA) 108 (90 Base) MCG/ACT inhaler, TAKE 2 PUFFS BY MOUTH EVERY 6 HOURS AS NEEDED FOR WHEEZE OR SHORTNESS OF BREATH, Disp: 8.5 each, Rfl: 1   amLODipine (NORVASC) 10 MG tablet, TAKE 1 TABLET BY MOUTH EVERY DAY, Disp: 90 tablet, Rfl: 0   aspirin 81 MG EC tablet, TAKE 1 TABLET BY MOUTH EVERY DAY, Disp: 30 tablet, Rfl: 5   benzonatate (TESSALON) 100 MG capsule, Take 1-2 capsules (100-200 mg total) by mouth 3 (three) times daily as needed for cough., Disp: 60 capsule, Rfl: 0   Budeson-Glycopyrrol-Formoterol (BREZTRI AEROSPHERE) 160-9-4.8 MCG/ACT AERO, Inhale 2 puffs into the lungs 2 (two) times daily., Disp:  10.7 g, Rfl: 2   cetirizine (ZYRTEC) 10 MG tablet, Take 10 mg by mouth daily., Disp: , Rfl:    FLUoxetine (PROZAC) 40 MG capsule, TAKE 1 CAPSULE BY MOUTH EVERY DAY, Disp: 90 capsule, Rfl: 1   fluticasone (FLONASE) 50 MCG/ACT nasal spray, SPRAY 2 SPRAYS INTO EACH NOSTRIL EVERY DAY, Disp: 48 mL, Rfl: 2   gabapentin (NEURONTIN) 100 MG capsule, Take 1 capsule (100 mg total) by mouth at bedtime., Disp: 90 capsule, Rfl: 1   hydrochlorothiazide (HYDRODIURIL) 12.5 MG tablet, TAKE 1 TABLET BY MOUTH EVERY DAY, Disp: 90 tablet, Rfl: 0   metoprolol succinate (TOPROL-XL) 50 MG 24 hr tablet, Take 1 tablet (50 mg total) by mouth daily. Take with or immediately following a meal., Disp: 90 tablet, Rfl: 1   rosuvastatin (CRESTOR) 40 MG tablet, TAKE 1 TABLET BY MOUTH EVERY DAY, Disp: 90 tablet, Rfl: 0   Vitamin D, Cholecalciferol, 50 MCG (2000 UT) CAPS, Take by mouth daily., Disp: , Rfl:   Allergies  Allergen Reactions   Hydroxychloroquine Itching    Pt take zyrtec to alleviate symptoms    Penicillin G Hives    Has patient had a PCN reaction causing immediate rash,  facial/tongue/throat swelling, SOB or lightheadedness with hypotension:unsure Has patient had a PCN reaction causing severe rash involving mucus membranes or skin necrosis:unsure Has patient had a PCN reaction that required hospitalization:unsure Has patient had a PCN reaction occurring within the last 10 years:No If all of the above answers are "NO", then may proceed with Cephalosporin use.     I personally reviewed active problem list, medication list, allergies, family history, social history, health maintenance with the patient/caregiver today.   ROS  Ten systems reviewed and is negative except as mentioned in HPI   Objective  Vitals:   01/23/21 0907  BP: 120/76  Pulse: 75  Resp: 18  Temp: 98.4 F (36.9 C)  TempSrc: Oral  SpO2: 98%  Weight: 175 lb 12.8 oz (79.7 kg)  Height: 5' 2" (1.575 m)    Body mass index is 32.15 kg/m.  Physical Exam  Constitutional: Patient appears well-developed and well-nourished. Obese  No distress.  HEENT: head atraumatic, normocephalic, pupils equal and reactive to light, ears normal   neck supple, throat within normal limits, mild post-nasal drainage, very tender to percussion on maxillary sinus, right worse than left  Cardiovascular: Normal rate, regular rhythm and normal heart sounds.  No murmur heard. No BLE edema. Pulmonary/Chest: Effort normal and breath sounds normal. No respiratory distress. Abdominal: Soft.  There is no tenderness. Psychiatric: Patient has a normal mood and affect. behavior is normal. Judgment and thought content normal.   Recent Results (from the past 2160 hour(s))  Compliance Drug Analysis, Ur     Status: None   Collection Time: 11/14/20  9:44 AM  Result Value Ref Range   Summary Note     Comment: ==================================================================== Compliance Drug Analysis, Ur ==================================================================== Test                             Result        Flag       Units  Drug Present   Fluoxetine                     PRESENT   Norfluoxetine                  PRESENT    Norfluoxetine is  an expected metabolite of fluoxetine.    Naproxen                       PRESENT   Metoprolol                     PRESENT ==================================================================== Test                      Result    Flag   Units      Ref Range   Creatinine              63               mg/dL      >=20 ==================================================================== Declared Medications:  Medication list was not provided. ==================================================================== For clinical consultation, please call [phone]. ====================================================================   Comp. Metabolic Panel (12)     Status: Abnormal   Collection Time: 11/14/20  3:01 PM  Result Value Ref Range   Glucose 100 (H) 70 - 99 mg/dL    Comment:               **Please note reference interval change**   BUN 13 6 - 24 mg/dL   Creatinine, Ser 0.58 0.57 - 1.00 mg/dL   eGFR 105 >59 mL/min/1.73   BUN/Creatinine Ratio 22 9 - 23   Sodium 140 134 - 144 mmol/L   Potassium 4.5 3.5 - 5.2 mmol/L   Chloride 104 96 - 106 mmol/L   Calcium 9.9 8.7 - 10.2 mg/dL   Total Protein 6.7 6.0 - 8.5 g/dL   Albumin 4.4 3.8 - 4.9 g/dL   Globulin, Total 2.3 1.5 - 4.5 g/dL   Albumin/Globulin Ratio 1.9 1.2 - 2.2   Bilirubin Total <0.2 0.0 - 1.2 mg/dL   Alkaline Phosphatase 109 44 - 121 IU/L   AST 26 0 - 40 IU/L  Magnesium     Status: None   Collection Time: 11/14/20  3:01 PM  Result Value Ref Range   Magnesium 1.9 1.6 - 2.3 mg/dL  Vitamin B12     Status: None   Collection Time: 11/14/20  3:01 PM  Result Value Ref Range   Vitamin B-12 274 232 - 1,245 pg/mL  Sedimentation rate     Status: None   Collection Time: 11/14/20  3:01 PM  Result Value Ref Range   Sed Rate 14 0 - 40 mm/hr  25-Hydroxy vitamin D Lcms D2+D3     Status: None    Collection Time: 11/14/20  3:01 PM  Result Value Ref Range   25-Hydroxy, Vitamin D 47 ng/mL    Comment: Reference Range: All Ages: Target levels 30 - 100    25-Hydroxy, Vitamin D-2 <1.0 ng/mL    Comment: This test was developed and its performance characteristics determined by [facility]. It has not been cleared or approved by the Food and Drug Administration.    25-Hydroxy, Vitamin D-3 46 ng/mL    Comment: This test was developed and its performance characteristics determined by [facility]. It has not been cleared or approved by the Food and Drug Administration.   C-reactive protein     Status: None   Collection Time: 11/14/20  3:01 PM  Result Value Ref Range   CRP 2 0 - 10 mg/L  Novel Coronavirus, NAA ([facility])     Status: None   Collection Time: 12/26/20 12:00 AM   Specimen: Nasopharyngeal(NP) swabs in vial transport medium   Nasopharynge  Previous  Result Value Ref Range   SARS-CoV-2, NAA Not Detected Not Detected    Comment: This nucleic acid amplification test was developed and its performance characteristics determined by [employer]. Nucleic acid amplification tests include RT-PCR and TMA. This test has not been FDA cleared or approved. This test has been authorized by FDA under an Emergency Use Authorization (EUA). This test is only authorized for the duration of time the declaration that circumstances exist justifying the authorization of the emergency use of in vitro diagnostic tests for detection of SARS-CoV-2 virus and/or diagnosis of COVID-19 infection under section 564(b)(1) of the Act, 21 U.S.C. 111NBV-6(P) (1), unless the authorization is terminated or revoked sooner. When diagnostic testing is negative, the possibility of a false negative result should be considered in the context of a patient's recent exposures and the presence of clinical signs and symptoms consistent with COVID-19. An individual without symptoms of COVID-19 and who is not shedding  SARS-CoV-2 virus wo uld expect to have a negative (not detected) result in this assay.   SARS-COV-2, NAA 2 DAY TAT     Status: None   Collection Time: 12/26/20 12:00 AM   Nasopharynge  Previous  Result Value Ref Range   SARS-CoV-2, NAA 2 DAY TAT Performed   POCT Influenza A/B     Status: Normal   Collection Time: 12/26/20  9:22 AM  Result Value Ref Range   Influenza A, POC Negative Negative   Influenza B, POC Negative Negative     PHQ2/9: Depression screen [facility] 2/9 01/23/2021 12/26/2020 11/14/2020 07/12/2020 01/19/2020  Decreased Interest 0 0 0 0 0  Down, Depressed, Hopeless 0 0 0 0 0  PHQ - 2 Score 0 0 0 0 0  Altered sleeping 0 0 0 0 0  Tired, decreased energy 0 0 0 0 1  Change in appetite 0 0 0 0 0  Feeling bad or failure about yourself  0 0 0 0 0  Trouble concentrating 0 0 0 0 0  Moving slowly or fidgety/restless 0 0 0 0 0  Suicidal thoughts 0 0 0 0 0  PHQ-9 Score 0 0 0 0 1  Difficult doing work/chores Not difficult at all - Not difficult at all - Somewhat difficult  Some recent data might be hidden    phq 9 is negative   Fall Risk: Fall Risk  01/23/2021 12/26/2020 11/14/2020 07/12/2020 01/19/2020  Falls in the past year? 0 0 0 0 0  Number falls in past yr: 0 0 0 0 0  Injury with Fall? 0 0 0 0 0  Risk for fall due to : - No Fall Risks (No Data) - -  Risk for fall due to: Comment - - patient stumbles - -  Follow up Falls evaluation completed Falls prevention discussed - - -      Functional Status Survey: Is the patient deaf or have difficulty hearing?: No Does the patient have difficulty seeing, even when wearing glasses/contacts?: No Does the patient have difficulty concentrating, remembering, or making decisions?: No Does the patient have difficulty walking or climbing stairs?: No Does the patient have difficulty dressing or bathing?: No    Assessment & Plan  1. Subacute maxillary sinusitis  - doxycycline (VIBRA-TABS) 100 MG tablet; Take 1 tablet (100 mg total)  by mouth 2 (two) times daily.  Dispense: 20 tablet; Refill: 0  2. Other cough  - chlorpheniramine-HYDROcodone (TUSSIONEX PENNKINETIC ER) 10-8 MG/5ML SUER; Take 5 mLs by mouth every 12 (twelve) hours as  needed.  Dispense: 140 mL; Refill: 0  3. Essential hypertension  - amLODipine (NORVASC) 10 MG tablet; Take 1 tablet (10 mg total) by mouth daily.  Dispense: 90 tablet; Refill: 1 - hydrochlorothiazide (HYDRODIURIL) 12.5 MG tablet; Take 1 tablet (12.5 mg total) by mouth daily.  Dispense: 90 tablet; Refill: 1 - metoprolol succinate (TOPROL-XL) 50 MG 24 hr tablet; Take 1 tablet (50 mg total) by mouth daily. Take with or immediately following a meal.  Dispense: 90 tablet; Refill: 1  4. Recurrent major depressive disorder, in remission (HCC)  - FLUoxetine (PROZAC) 40 MG capsule; TAKE 1 CAPSULE BY MOUTH EVERY DAY  Dispense: 90 capsule; Refill: 1  5. DDD (degenerative disc disease), lumbar  - gabapentin (NEURONTIN) 100 MG capsule; Take 1-2 capsules (100-200 mg total) by mouth at bedtime.  Dispense: 180 capsule; Refill: 1  6. Chronic left-sided low back pain without sciatica  - gabapentin (NEURONTIN) 100 MG capsule; Take 1-2 capsules (100-200 mg total) by mouth at bedtime.  Dispense: 180 capsule; Refill: 1  7. Pure hypercholesterolemia  - rosuvastatin (CRESTOR) 40 MG tablet; Take 1 tablet (40 mg total) by mouth daily.  Dispense: 90 tablet; Refill: 1  8. Thoracic aorta atherosclerosis (HCC)  - rosuvastatin (CRESTOR) 40 MG tablet; Take 1 tablet (40 mg total) by mouth daily.  Dispense: 90 tablet; Refill: 1  9. Vitamin D deficiency   10. Centrilobular emphysema ([place])   11. Vitamin B12 deficiency  - Cyanocobalamin (VITAMIN B-12) 500 MCG SUBL; Place 1 tablet (500 mcg total) under the tongue daily at 12 noon.  Dispense: 100 tablet; Refill: 1 - cyanocobalamin ((VITAMIN B-12)) injection 1,000 mcg

## 2021-01-23 ENCOUNTER — Encounter: Payer: Self-pay | Admitting: Family Medicine

## 2021-01-23 ENCOUNTER — Other Ambulatory Visit: Payer: Self-pay

## 2021-01-23 ENCOUNTER — Ambulatory Visit (INDEPENDENT_AMBULATORY_CARE_PROVIDER_SITE_OTHER): Payer: No Typology Code available for payment source | Admitting: Family Medicine

## 2021-01-23 VITALS — BP 120/76 | HR 75 | Temp 98.4°F | Resp 18 | Ht 62.0 in | Wt 175.8 lb

## 2021-01-23 DIAGNOSIS — I10 Essential (primary) hypertension: Secondary | ICD-10-CM | POA: Diagnosis not present

## 2021-01-23 DIAGNOSIS — E538 Deficiency of other specified B group vitamins: Secondary | ICD-10-CM

## 2021-01-23 DIAGNOSIS — F3340 Major depressive disorder, recurrent, in remission, unspecified: Secondary | ICD-10-CM

## 2021-01-23 DIAGNOSIS — M51369 Other intervertebral disc degeneration, lumbar region without mention of lumbar back pain or lower extremity pain: Secondary | ICD-10-CM

## 2021-01-23 DIAGNOSIS — J432 Centrilobular emphysema: Secondary | ICD-10-CM

## 2021-01-23 DIAGNOSIS — E7800 Pure hypercholesterolemia, unspecified: Secondary | ICD-10-CM

## 2021-01-23 DIAGNOSIS — E559 Vitamin D deficiency, unspecified: Secondary | ICD-10-CM

## 2021-01-23 DIAGNOSIS — I700 Atherosclerosis of aorta: Secondary | ICD-10-CM

## 2021-01-23 DIAGNOSIS — J0100 Acute maxillary sinusitis, unspecified: Secondary | ICD-10-CM

## 2021-01-23 DIAGNOSIS — R058 Other specified cough: Secondary | ICD-10-CM

## 2021-01-23 DIAGNOSIS — M545 Low back pain, unspecified: Secondary | ICD-10-CM

## 2021-01-23 DIAGNOSIS — G8929 Other chronic pain: Secondary | ICD-10-CM

## 2021-01-23 DIAGNOSIS — M5136 Other intervertebral disc degeneration, lumbar region: Secondary | ICD-10-CM

## 2021-01-23 MED ORDER — HYDROCOD POLST-CPM POLST ER 10-8 MG/5ML PO SUER: 5.0000 mL | Freq: Two times a day (BID) | ORAL | 0 refills | Status: DC | PRN

## 2021-01-23 MED ORDER — VITAMIN B-12 500 MCG SL SUBL: 1.0000 | SUBLINGUAL_TABLET | Freq: Every day | SUBLINGUAL | 1 refills | Status: AC

## 2021-01-23 MED ORDER — METOPROLOL SUCCINATE ER 50 MG PO TB24: 50.0000 mg | ORAL_TABLET | Freq: Every day | ORAL | 1 refills | Status: DC

## 2021-01-23 MED ORDER — GABAPENTIN 100 MG PO CAPS: 100.0000 mg | ORAL_CAPSULE | Freq: Every day | ORAL | 1 refills | Status: DC

## 2021-01-23 MED ORDER — CYANOCOBALAMIN 1000 MCG/ML IJ SOLN
1000.0000 ug | Freq: Once | INTRAMUSCULAR | Status: AC
  Administered 2021-01-23: 10:00:00 1000 ug via INTRAMUSCULAR

## 2021-01-23 MED ORDER — FLUOXETINE HCL 40 MG PO CAPS: ORAL_CAPSULE | ORAL | 1 refills | Status: DC

## 2021-01-23 MED ORDER — HYDROCHLOROTHIAZIDE 12.5 MG PO TABS: 12.5000 mg | ORAL_TABLET | Freq: Every day | ORAL | 1 refills | Status: DC

## 2021-01-23 MED ORDER — DOXYCYCLINE HYCLATE 100 MG PO TABS: 100.0000 mg | ORAL_TABLET | Freq: Two times a day (BID) | ORAL | 0 refills | Status: DC

## 2021-01-23 MED ORDER — AMLODIPINE BESYLATE 10 MG PO TABS: 10.0000 mg | ORAL_TABLET | Freq: Every day | ORAL | 1 refills | Status: DC

## 2021-01-23 MED ORDER — ROSUVASTATIN CALCIUM 40 MG PO TABS: 40.0000 mg | ORAL_TABLET | Freq: Every day | ORAL | 1 refills | Status: DC

## 2021-02-08 ENCOUNTER — Ambulatory Visit: Payer: No Typology Code available for payment source | Admitting: Family Medicine

## 2021-03-21 ENCOUNTER — Other Ambulatory Visit: Payer: Self-pay | Admitting: Family Medicine

## 2021-03-21 DIAGNOSIS — Z1231 Encounter for screening mammogram for malignant neoplasm of breast: Secondary | ICD-10-CM

## 2021-03-22 ENCOUNTER — Encounter: Payer: Self-pay | Admitting: Family Medicine

## 2021-04-24 ENCOUNTER — Ambulatory Visit
Admission: RE | Admit: 2021-04-24 | Discharge: 2021-04-24 | Disposition: A | Discharge status: Home / Self Care | Payer: No Typology Code available for payment source | Source: Ambulatory Visit | Attending: Family Medicine | Admitting: Family Medicine

## 2021-04-24 ENCOUNTER — Other Ambulatory Visit: Payer: Self-pay

## 2021-04-24 DIAGNOSIS — Z1231 Encounter for screening mammogram for malignant neoplasm of breast: Secondary | ICD-10-CM | POA: Diagnosis present

## 2021-05-05 NOTE — Progress Notes (Signed)
PROVIDER NOTE: Information contained herein reflects review and annotations entered in association with encounter. Interpretation of such information and data should be left to medically-trained personnel. Information provided to patient can be located elsewhere in the medical record under "Patient Instructions". Document created using STT-dictation technology, any transcriptional errors that may result from process are unintentional.  ?  ?Patient: Nichole Wells  Service Category: E/M  Provider: Gaspar Cola, MD  ?DOB: 1960/09/26  DOS: 05/06/2021  Specialty: Interventional Pain Management  ?MRN: [id number]  Setting: Ambulatory outpatient  PCP: Steele Sizer, MD  ?Type: Established Patient    Referring Provider: Steele Sizer, MD  ?Location: Office  Delivery: Face-to-face    ? ?Primary Reason(s) for Visit: Encounter for evaluation before starting new chronic pain management plan of care (Level of risk: moderate) ?CC: Back Pain (Low left) and Hand Pain (Thumb, right) ? ?HPI  ?Nichole Wells is a 61 y.o. year old, female patient, who comes today for a follow-up evaluation to review the test results and decide on a treatment plan. She has Hypertension; Anxiety and depression; DDD (degenerative disc disease), lumbar; Cardiac murmur; HLD (hyperlipidemia); Insomnia; Family history of ovarian cancer; Status post laparoscopic assisted vaginal hysterectomy (LAVH); Surgical menopause on hormone replacement therapy; Vitamin D deficiency; Inflammatory arthritis; Current tobacco use; Thoracic aorta atherosclerosis ([place]); Centrilobular emphysema ([place]); Abnormal liver CT; Recurrent major depressive disorder, in remission ([place]); Chronic pain syndrome; Pharmacologic therapy; Disorder of skeletal system; Problems influencing health status; Chronic low back pain (1ry area of Pain) (Bilateral) (L>R) w/o sciatica; Lumbosacral facet syndrome (Bilateral); Chronic wrist pain (2ry area of Pain) (Right); History of carpal tunnel surgery  of wrist (Right); Chronic neck pain (3ry area of Pain) (Bilateral) (L>R); Cervicalgia; Cervical facet syndrome; Chronic hip pain (4th area of Pain) (Bilateral); DDD (degenerative disc disease), cervical (Multilevel); Cervical facet arthropathy (Multilevel) (Bilateral); 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 Her primarily concern today is the Back Pain (Low left) and Hand Pain (Thumb, right) ? ?Pain Assessment: ?Location: Lower Back ?Radiating: radiates dwon left leg to ankle in the back ?Onset: More than a month ago ?Duration: Chronic pain ?Quality: Aching, Throbbing, Burning, Sore (electric shock) ?Severity: 8 /10 (subjective, self-reported pain score)  ?Effect on ADL: Limits activities ?Timing: Constant ?Modifying factors: rest, heat, aleve ?BP: (!) 148/73  HR: 71 ? ?Nichole Wells comes in today for a follow-up visit after her initial evaluation on 01/14/2021. Today we went over the results of her tests. These were explained in "Layman's terms". During today's appointment we went over my diagnostic impression, as well as the proposed treatment plan. ? ?According to the patient the primary area of pain inside of the lower back (Bilateral) (L>R).  The patient denies any surgeries, recent x-rays, but she does admit to having had physical therapy years ago at the [facility].  She also indicated that it did not help.  In addition, the patient also indicates having had some nerve blocks done at the [facility] approximately 3 to 5 years ago which seem to have helped for a little while.  This pain started approximately 5 to 6 years ago while she was working at [employer].  She currently works as a Occupational psychologist.  Review of the  medical records in "Care Everywhere" shows evidence of the patient having  had fluoroscopic guidance used for 2 procedures performed by Dr. Sharlet Salina at the [facility].  1 was done on 07/07/2014 and the second 1 was done on 07/31/2014. ? ?Procedures done by Dr. Sharlet Salina: ?1.  Bilateral L5-S1 transforaminal ESI (07/07/2014) ?2.  Left L5-S1 transforaminal ESI + right S1 TFESI (07/31/2014)  ? ?The patient's secondary area pain is that of the right wrist.  She refers having had a carpal tunnel which was diagnosed by nerve conduction test and she underwent surgery to release it approximately 5 years ago.  After the surgery she did well for a while until recently when the pain started coming back.  She refers that the surgery was done by a hand surgeon in [place], across [facility].  The patient admits having had physical therapy after the surgery which did help.  She denies any recent x-rays or any nerve blocks or joint injections after the surgery. ? ?The patient's third area pain is that of the neck (posterior aspect) (Bilateral) (L>R).  She denies any cervical spine surgery, nerve blocks, physical therapy, or any recent x-rays.  The patient does admit to crepitus when moving the neck around but she denies any associated headaches or upper extremity symptoms.  She denies any upper extremity pain, numbness, or weakness. ? ?The patient's fourth area pain is that of the hips (Bilateral).  The patient denies any surgeries, x-rays, or physical therapy.  She describes having had injections into the hip area more than 6 years ago by somebody at the [facility] which she thinks might have been Dr. Jefm Bryant.  She indicates that it did not help. ? ?In addition to the above, the patient mentioned that she has been told that she has osteoarthritis in multiple joints. ? ?Pharmacotherapy: The patient indicates currently taking gabapentin 100 mg p.o. at bedtime. ? ?Physical exam: Today the patient was  able to toe walk and heel walk without any problems or deficits.  Lumbar spine flexion demonstrated the patient to be able to reach down to about a foot from the floor and she indicated having pain both in the extension and the flexion portion of the maneuver with a extension being worse.  The pain was referred to worse the left lower back.  She also demonstrated a positive "double stage recovery".  Lumbar spine hyperextension was positive for reproduction of her left-sided low back pain.  No lower extremity symptoms.  Hyperextension and rotation was positive bilaterally for lumbar facet arthralgia with the left side being worse than the right.  This maneuver exactly reproduced the patient's pain.  Lateral bending of the lumbar spine was positive towards the left side for ipsilateral pain in the lower back with no radicular symptoms.  Straight leg raise was negative bilaterally except for some discomfort in the lower back, primarily on the left side.  Provocative Patrick maneuver was positive bilaterally for hip joint arthralgia with some decrease range of motion, also bilaterally.  The maneuver was negative bilaterally for sacroiliac joint discomfort.  Lower extremity deep tendon reflexes were positive and equal bilaterally for the patellar tendon and the Achilles tendon.  Cervical spine range of motion seem to be relatively within normal limits with some pain to the posterior aspect of the neck with bilateral ear to shoulder and cervical rotation maneuvers, both of which triggered discomfort in the contralateral side.  She did indicate feeling decreased range of motion and a sensation that she was unable to go any further when doing ear to  shoulder maneuver towards the left side. ? ?Today I went over the results of her x-rays and I have explained those to the patient in layman's terms.  Her back pain seems to be coming from her facet joints and for this reason, we will be scheduling the patient for a diagnostic  bilateral lumbar facet block #1 under fluoroscopic guidance and IV sedation.  Today I have explained to the patient the procedure and what it consists of. ? ?In considering the treatment plan options, Ms.

## 2021-05-06 ENCOUNTER — Encounter: Payer: Self-pay | Admitting: Pain Medicine

## 2021-05-06 ENCOUNTER — Other Ambulatory Visit: Payer: Self-pay

## 2021-05-06 ENCOUNTER — Ambulatory Visit: Payer: No Typology Code available for payment source | Attending: Pain Medicine | Admitting: Pain Medicine

## 2021-05-06 VITALS — BP 148/73 | HR 71 | Temp 97.2°F | Resp 18 | Ht 62.0 in | Wt 175.0 lb

## 2021-05-06 DIAGNOSIS — M25531 Pain in right wrist: Secondary | ICD-10-CM | POA: Diagnosis not present

## 2021-05-06 DIAGNOSIS — M25551 Pain in right hip: Secondary | ICD-10-CM | POA: Diagnosis present

## 2021-05-06 DIAGNOSIS — M542 Cervicalgia: Secondary | ICD-10-CM | POA: Insufficient documentation

## 2021-05-06 DIAGNOSIS — G894 Chronic pain syndrome: Secondary | ICD-10-CM

## 2021-05-06 DIAGNOSIS — M545 Low back pain, unspecified: Secondary | ICD-10-CM | POA: Insufficient documentation

## 2021-05-06 DIAGNOSIS — M47812 Spondylosis without myelopathy or radiculopathy, cervical region: Secondary | ICD-10-CM | POA: Diagnosis present

## 2021-05-06 DIAGNOSIS — G8929 Other chronic pain: Secondary | ICD-10-CM | POA: Diagnosis present

## 2021-05-06 DIAGNOSIS — M25552 Pain in left hip: Secondary | ICD-10-CM | POA: Insufficient documentation

## 2021-05-06 DIAGNOSIS — M47817 Spondylosis without myelopathy or radiculopathy, lumbosacral region: Secondary | ICD-10-CM

## 2021-05-06 NOTE — Progress Notes (Signed)
Safety precautions to be maintained throughout the outpatient stay will include: orient to surroundings, keep bed in low position, maintain call bell within reach at all times, provide assistance with transfer out of bed and ambulation.  

## 2021-05-06 NOTE — Patient Instructions (Signed)
______________________________________________________________________ ? ?Preparing for Procedure with Sedation ? ?NOTICE: Due to recent regulatory changes, starting on September 10, 2020, procedures requiring intravenous (IV) sedation will no longer be performed at the [facility].  These types of procedures are required to be performed at [facility] ambulatory surgery facility.  We are very sorry for the inconvenience. ? ?Procedure appointments are limited to planned procedures: ?No Prescription Refills. ?No disability issues will be discussed. ?No medication changes will be discussed. ? ?Instructions: ?Oral Intake: Do not eat or drink anything for at least 8 hours prior to your procedure. (Exception: Blood Pressure Medication. See below.) ?Transportation: A driver is required. You may not drive yourself after the procedure. ?Blood Pressure Medicine: Do not forget to take your blood pressure medicine with a sip of water the morning of the procedure. If your Diastolic (lower reading) is above 100 mmHg, elective cases will be cancelled/rescheduled. ?Blood thinners: These will need to be stopped for procedures. Notify our staff if you are taking any blood thinners. Depending on which one you take, there will be specific instructions on how and when to stop it. ?Diabetics on insulin: Notify the staff so that you can be scheduled 1st case in the morning. If your diabetes requires high dose insulin, take only ? of your normal insulin dose the morning of the procedure and notify the staff that you have done so. ?Preventing infections: Shower with an antibacterial soap the morning of your procedure. ?Build-up your immune system: Take 1000 mg of Vitamin C with every meal (3 times a day) the day prior to your procedure. ?Antibiotics: Inform the staff if you have a condition or reason that requires you to take antibiotics before dental procedures. ?Pregnancy: If you are pregnant, call and cancel the procedure. ?Sickness: If  you have a cold, fever, or any active infections, call and cancel the procedure. ?Arrival: You must be in the facility at least 30 minutes prior to your scheduled procedure. ?Children: Do not bring children with you. ?Dress appropriately: Bring dark clothing that you would not mind if they get stained. ?Valuables: Do not bring any jewelry or valuables. ? ?Reasons to call and reschedule or cancel your procedure: (Following these recommendations will minimize the risk of a serious complication.) ?Surgeries: Avoid having procedures within 2 weeks of any surgery. (Avoid for 2 weeks before or after any surgery). ?Flu Shots: Avoid having procedures within 2 weeks of a flu shots. (Avoid for 2 weeks before or after immunizations). ?Barium: Avoid having a procedure within 7-10 days after having had a radiological study involving the use of radiological contrast. (Myelograms, Barium swallow or enema study). ?Heart attacks: Avoid any elective procedures or surgeries for the initial 6 months after a "Myocardial Infarction" (Heart Attack). ?Blood thinners: It is imperative that you stop these medications before procedures. Let us know if you if you take any blood thinner.  ?Infection: Avoid procedures during or within two weeks of an infection (including chest colds or gastrointestinal problems). Symptoms associated with infections include: Localized redness, fever, chills, night sweats or profuse sweating, burning sensation when voiding, cough, congestion, stuffiness, runny nose, sore throat, diarrhea, nausea, vomiting, cold or Flu symptoms, recent or current infections. It is specially important if the infection is over the area that we intend to treat. ?Heart and lung problems: Symptoms that may suggest an active cardiopulmonary problem include: cough, chest pain, breathing difficulties or shortness of breath, dizziness, ankle swelling, uncontrolled high or unusually low blood pressure, and/or palpitations. If you are    experiencing any of these symptoms, cancel your procedure and contact your primary care physician for an evaluation. ? ?Remember:  ?Regular Business hours are:  ?Monday to Thursday 8:00 AM to 4:00 PM ? ?Provider's Schedule: ?Augusten Lipkin, MD:  ?Procedure days: Tuesday and Thursday 7:30 AM to 4:00 PM ? ?Bilal Lateef, MD:  ?Procedure days: Monday and Wednesday 7:30 AM to 4:00 PM ?______________________________________________________________________ ? ____________________________________________________________________________________________ ? ?General Risks and Possible Complications ? ?Patient Responsibilities: It is important that you read this as it is part of your informed consent. It is our duty to inform you of the risks and possible complications associated with treatments offered to you. It is your responsibility as a patient to read this and to ask questions about anything that is not clear or that you believe was not covered in this document. ? ?Patient?s Rights: You have the right to refuse treatment. You also have the right to change your mind, even after initially having agreed to have the treatment done. However, under this last option, if you wait until the last second to change your mind, you may be charged for the materials used up to that point. ? ?Introduction: Medicine is not an exact science. Everything in Medicine, including the lack of treatment(s), carries the potential for danger, harm, or loss (which is by definition: Risk). In Medicine, a complication is a secondary problem, condition, or disease that can aggravate an already existing one. All treatments carry the risk of possible complications. The fact that a side effects or complications occurs, does not imply that the treatment was conducted incorrectly. It must be clearly understood that these can happen even when everything is done following the highest safety standards. ? ?No treatment: You can choose not to proceed with the  proposed treatment alternative. The ?PRO(s)? would include: avoiding the risk of complications associated with the therapy. The ?CON(s)? would include: not getting any of the treatment benefits. These benefits fall under one of three categories: diagnostic; therapeutic; and/or palliative. Diagnostic benefits include: getting information which can ultimately lead to improvement of the disease or symptom(s). Therapeutic benefits are those associated with the successful treatment of the disease. Finally, palliative benefits are those related to the decrease of the primary symptoms, without necessarily curing the condition (example: decreasing the pain from a flare-up of a chronic condition, such as incurable terminal cancer). ? ?General Risks and Complications: These are associated to most interventional treatments. They can occur alone, or in combination. They fall under one of the following six (6) categories: no benefit or worsening of symptoms; bleeding; infection; nerve damage; allergic reactions; and/or death. ?No benefits or worsening of symptoms: In Medicine there are no guarantees, only probabilities. No healthcare provider can ever guarantee that a medical treatment will work, they can only state the probability that it may. Furthermore, there is always the possibility that the condition may worsen, either directly, or indirectly, as a consequence of the treatment. ?Bleeding: This is more common if the patient is taking a blood thinner, either prescription or over the counter (example: Goody Powders, Fish oil, Aspirin, Garlic, etc.), or if suffering a condition associated with impaired coagulation (example: Hemophilia, cirrhosis of the liver, low platelet counts, etc.). However, even if you do not have one on these, it can still happen. If you have any of these conditions, or take one of these drugs, make sure to notify your treating physician. ?Infection: This is more common in patients with a compromised  immune system, either due to disease (example:   diabetes, cancer, human immunodeficiency virus [HIV], etc.), or due to medications or treatments (example: therapies used to treat cancer and rheumatological diseas

## 2021-05-16 ENCOUNTER — Ambulatory Visit: Payer: No Typology Code available for payment source | Attending: Pain Medicine | Admitting: Pain Medicine

## 2021-05-16 ENCOUNTER — Encounter: Payer: Self-pay | Admitting: Pain Medicine

## 2021-05-16 ENCOUNTER — Telehealth: Payer: Self-pay

## 2021-05-16 DIAGNOSIS — M47817 Spondylosis without myelopathy or radiculopathy, lumbosacral region: Secondary | ICD-10-CM | POA: Diagnosis not present

## 2021-05-16 DIAGNOSIS — M545 Low back pain, unspecified: Secondary | ICD-10-CM

## 2021-05-16 DIAGNOSIS — G8929 Other chronic pain: Secondary | ICD-10-CM | POA: Diagnosis not present

## 2021-05-16 NOTE — Progress Notes (Signed)
Patient: Nichole Wells  Service Category: E/M  Provider: Gaspar Cola, MD  ?DOB: 08-Nov-1960  DOS: 05/16/2021  Location: Office  ?MRN: [id number]  Setting: Ambulatory outpatient  Referring Provider: Steele Sizer, MD  ?Type: Established Patient  Specialty: Interventional Pain Management  PCP: Steele Sizer, MD  ?Location: Remote location  Delivery: TeleHealth    ? ?Virtual Encounter - Pain Management ?PROVIDER NOTE: Information contained herein reflects review and annotations entered in association with encounter. Interpretation of such information and data should be left to medically-trained personnel. Information provided to patient can be located elsewhere in the medical record under "Patient Instructions". Document created using STT-dictation technology, any transcriptional errors that may result from process are unintentional.  ?  ?Contact & Pharmacy ?Preferred: [phone] ?Home: [phone] (home) ?Mobile: [phone] (mobile) ?E-mail: [email or web address]  ?[id number] - [place], [place] - [address] ?[address] ?[place] [address] ?Phone: [phone] Fax: [phone] ? ?[id number] [address], [place] [address] ?Phone: [phone] Fax: [phone] ?  ?Pre-screening  ?[name] offered "in-person" vs "virtual" encounter. She indicated preferring virtual for this encounter.  ? ?Reason ?COVID-19*  Social distancing based on CDC and AMA recommendations.  ? ?I contacted Nichole Wells on 05/16/2021 via telephone.      I clearly identified myself as Gaspar Cola, MD. I verified that I was speaking with the correct person using two identifiers (Name: Nichole Wells, and date of birth: 1960/12/17). ? ?Consent ?I sought verbal advanced consent from Nichole Wells for virtual visit interactions. I informed [name] of possible security and privacy concerns, risks, and limitations associated with providing "not-in-person" medical  evaluation and management services. I also informed Nichole Wells of the availability of "in-person" appointments. Finally, I informed her that there would be a charge for the virtual visit and that she could be  personally, fully or partially, financially responsible for it. Nichole Wells expressed understanding and agreed to proceed.  ? ?Historic Elements   ?[name] is a 61 y.o. year old, female patient evaluated today after our last contact on 05/06/2021. [name]  has a past medical history of Anxiety, Arthritis, GERD (gastroesophageal reflux disease), Heart murmur, Hypertension, Inflammatory arthritis, and Vitamin D deficiency. She also  has a past surgical history that includes Dilation and curettage of uterus; Cholecystectomy; Uterine fibroid embolization; Shoulder arthroscopy (Right); Tubal ligation; Breast surgery; laparoscopy (N/A, 12/10/2015); Laparoscopic lysis of adhesions (12/10/2015); Breast cyst aspiration (Right); Laparoscopic vaginal hysterectomy with salpingo oophorectomy (Bilateral, 01/28/2016); Abdominal hysterectomy; Breast biopsy (Left); and Colonoscopy with propofol (N/A, 10/11/2019). [name] has a current medication list which includes the following prescription(s): albuterol, amlodipine, aspirin, breztri aerosphere, cetirizine, vitamin b-12, fluoxetine, fluticasone, gabapentin, hydrochlorothiazide, metoprolol succinate, rosuvastatin, and vitamin d3. She  reports that she has been smoking cigarettes. She started smoking about 33 years ago. She has a 37.00 pack-year smoking history. She has never used smokeless tobacco. She reports current alcohol use. She reports that she does not use drugs. [name] is allergic to hydroxychloroquine and penicillin g.  ? ?HPI  ?Today, she is being contacted for follow-up evaluation to determine the reason why her Lumbar facet blocks were denied.  As it turns out it is because there was not a statement in the chart indicating that should  the diagnostic lumbar facet blocks proved to be positive for lumbar facet syndrome, we would follow-up with radiofrequency ablation.  Although this is exactly what we always do, this statement was  not in the initial request for the very first diagnostic lumbar facet blocks since we first have to determine if this is the etiology of the pain in which case then we will have to confirm by doing a second diagnostic injection, which would then be followed by radiofrequency ablation should the 2 of them proved to be positive positive for facet joint arthropathy and pain.  I personally think that this is an extremely lame excuse for denying the first and a possible series of 2 diagnostic lumbar facet blocks. ? ?I still believe as I described clearly on the 05/06/2021 note that the patient needs to have a diagnostic bilateral lumbar facet block as soon as possible.  I believe this to be medically necessary. ? ?Pharmacotherapy Assessment  ? ?Opioid Analgesic: None ?MME/day: 0 mg/day  ? ?Monitoring: ?[place] PMP: PDMP not reviewed this encounter.       ?Pharmacotherapy: No side-effects or adverse reactions reported. ?Compliance: No problems identified. ?Effectiveness: Clinically acceptable. ?Plan: Refer to "POC". UDS:  ?Summary  ?Date Value Ref Range Status  ?11/14/2020 Note  Final  ?  Comment:  ?  ==================================================================== ?Compliance Drug Analysis, Ur ?==================================================================== ?Test                             Result       Flag       Units ? ?Drug Present ?  Fluoxetine                     PRESENT ?  Norfluoxetine                  PRESENT ?   Norfluoxetine is an expected metabolite of fluoxetine. ? ?  Naproxen                       PRESENT ?  Metoprolol                     PRESENT ?==================================================================== ?Test                      Result    Flag   Units      Ref Range ?  Creatinine              63                mg/dL      >=20 ?==================================================================== ?Declared Medications: ? Medication list was not provided. ?==================================================================== ?For clinical consultation, please call [phone]. ?==================================================================== ?  ?  ? ?Laboratory Chemistry Profile  ? ?Renal ?Lab Results  ?Component Value Date  ? BUN 13 11/14/2020  ? CREATININE 0.58 11/14/2020  ? BCR 22 11/14/2020  ? GFRAA 124 07/12/2020  ? GFRNONAA 107 07/12/2020  ?  Hepatic ?Lab Results  ?Component Value Date  ? AST 26 11/14/2020  ? ALT 34 (H) 07/12/2020  ? ALBUMIN 4.4 11/14/2020  ? ALKPHOS 109 11/14/2020  ? HCVAB NON-REACTIVE 09/23/2016  ? LIPASE 36 04/29/2015  ?  ?Electrolytes ?Lab Results  ?Component Value Date  ? NA 140 11/14/2020  ? K 4.5 11/14/2020  ? CL 104 11/14/2020  ? CALCIUM 9.9 11/14/2020  ? MG 1.9 11/14/2020  ?  Bone ?Lab Results  ?Component Value Date  ? VD25OH 53 07/20/2019  ? 25OHVITD1 47 11/14/2020  ? 23RAQTMA2 <1.0 11/14/2020  ? 25OHVITD3 46 11/14/2020  ?  ?Inflammation (CRP:  Acute Phase) (ESR: Chronic Phase) ?Lab Results  ?Component Value Date  ? CRP 2 11/14/2020  ? ESRSEDRATE 14 11/14/2020  ?    ?  ? ?Note: Above Lab results reviewed. ? ?Imaging  ?MM 3D SCREEN BREAST BILATERAL ?CLINICAL DATA:  Screening. ? ?EXAM: ?DIGITAL SCREENING BILATERAL MAMMOGRAM WITH TOMOSYNTHESIS AND CAD ? ?TECHNIQUE: ?Bilateral screening digital craniocaudal and mediolateral oblique ?mammograms were obtained. Bilateral screening digital breast ?tomosynthesis was performed. The images were evaluated with ?computer-aided detection. ? ?COMPARISON:  Previous exam(s). ? ?ACR Breast Density Category b: There are scattered areas of ?fibroglandular density. ? ?FINDINGS: ?There are no findings suspicious for malignancy. ? ?IMPRESSION: ?No mammographic evidence of malignancy. A result letter of this ?screening mammogram will be mailed  directly to the patient. ? ?RECOMMENDATION: ?Screening mammogram in one year. (Code:SM-B-01Y) ? ?BI-RADS CATEGORY  1: Negative. ? ?Electronically Signed ?  By: Audie Pinto M.D. ?  On: 04/24/2021 11:51

## 2021-05-16 NOTE — Telephone Encounter (Signed)
Insurance Treatment Denial Note  ?Date order was entered: 05/06/21 ?Order entered by: Milinda Pointer, MD ?Requested treatment: Bilateral Lumbar facet block ?Reason for denial: Documentation of future RFA plan is missing. ?Recommended for approval: If the requested service is done and it helps the patient's symptoms, there is a plan to do a procedure that decreases the pain by heating the nerve tissue (RFA). ?  ? ?

## 2021-05-16 NOTE — Telephone Encounter (Signed)
Insurance denied facets. They state it should be documented that if these work you are planning for an RFA ?

## 2021-06-11 NOTE — Progress Notes (Signed)
Name: Nichole Wells   MRN: [id number]    DOB: 12-14-1960   Date:06/12/2021 ? ?     Progress Note ? ?Subjective ? ?Chief Complaint ? ?Annual Exam  ? ?HPI ? ?Patient presents for annual CPE. ? ?Diet: cooks at home most to time ?Exercise: discussed 150 minutes per week   ? ?[place] Office Visit from 06/12/2021 in [facility]  ?AUDIT-C Score 2  ? ?  ? ?Depression: Phq 9 is  negative ? ?  06/12/2021  ? 10:46 AM 05/06/2021  ?  2:17 PM 01/23/2021  ?  9:06 AM 12/26/2020  ?  8:25 AM 11/14/2020  ?  8:58 AM  ?Depression screen PHQ 2/9  ?Decreased Interest 0 0 0 0 0  ?Down, Depressed, Hopeless 0 0 0 0 0  ?PHQ - 2 Score 0 0 0 0 0  ?Altered sleeping 0  0 0 0  ?Tired, decreased energy 0  0 0 0  ?Change in appetite 0  0 0 0  ?Feeling bad or failure about yourself  0  0 0 0  ?Trouble concentrating 0  0 0 0  ?Moving slowly or fidgety/restless 0  0 0 0  ?Suicidal thoughts 0  0 0 0  ?PHQ-9 Score 0  0 0 0  ?Difficult doing work/chores Not difficult at all  Not difficult at all  Not difficult at all  ? ?Hypertension: ?BP Readings from Last 3 Encounters:  ?06/12/21 (!) 166/92  ?05/06/21 (!) 148/73  ?01/23/21 120/76  ? ?She will have spinal blocks due to increase in back pain - tomorrow.  ? ?Obesity: ?Wt Readings from Last 3 Encounters:  ?06/12/21 173 lb 4.8 oz (78.6 kg)  ?05/06/21 175 lb (79.4 kg)  ?01/23/21 175 lb 12.8 oz (79.7 kg)  ? ?BMI Readings from Last 3 Encounters:  ?06/12/21 31.70 kg/m?  ?05/06/21 32.01 kg/m?  ?01/23/21 32.15 kg/m?  ?  ? ?Vaccines:  ? ?Tdap: up to date  ?Shingrix: up to date  ?Pneumonia: up to date  ?Flu: up to date  ?COVID-19: discussed bivalent booster  ? ? ?Hep C Screening: done 2018  ?STD testing and prevention (HIV/chl/gon/syphilis):  ?Intimate partner violence: negative screen  ?Sexual History : one partner , not very frequency, no pain  ?Menstrual History/LMP/Abnormal Bleeding: post-menopausal , s/p hysterectomy for benign causes  ?Discussed importance of follow up if any post-menopausal  bleeding: not applicable  ?Incontinence Symptoms: negative for symptoms  ? ?Breast cancer:  ?- Last Mammogram: 03/23 ?- BRCA gene screening: only maternal aunt with ovarian cancer , one cousin had breast cancer on maternal side , maternal uncles x 2 with colon cancer - discussed genetic testing and she will think about it  ? ?Osteoporosis Prevention : Discussed high calcium and vitamin D supplementation, weight bearing exercises ?Bone density : discussed  ? ?Cervical cancer screening: N/A ? ?Skin cancer: Discussed monitoring for atypical lesions  ?Colorectal cancer:due for repeat, referral placed    ?Lung cancer:  Low Dose CT Chest recommended if Age 37-80 years, 20 pack-year currently smoking OR have quit w/in 15years. Patient does qualify for screen   ?ECG: 2021  ? ?Advanced Care Planning: A voluntary discussion about advance care planning including the explanation and discussion of advance directives.  Discussed health care proxy and Living will, and the patient was able to identify a health care proxy as husband .  Patient does not have a living will and power of attorney of health care  ? ?Lipids: ?Lab Results  ?Component  Value Date  ? CHOL 195 07/12/2020  ? CHOL 138 07/20/2019  ? CHOL 209 (H) 08/19/2018  ? ?Lab Results  ?Component Value Date  ? HDL 53 07/12/2020  ? HDL 53 07/20/2019  ? HDL 52 08/19/2018  ? ?Lab Results  ?Component Value Date  ? LDLCALC 119 (H) 07/12/2020  ? [place] 65 07/20/2019  ? LDLCALC 135 (H) 08/19/2018  ? ?Lab Results  ?Component Value Date  ? TRIG 123 07/12/2020  ? TRIG 112 07/20/2019  ? TRIG 109 08/19/2018  ? ?Lab Results  ?Component Value Date  ? CHOLHDL 3.7 07/12/2020  ? CHOLHDL 2.6 07/20/2019  ? CHOLHDL 4.0 08/19/2018  ? ?No results found for: LDLDIRECT ? ?Glucose: ?Glucose  ?Date Value Ref Range Status  ?11/14/2020 100 (H) 70 - 99 mg/dL Final  ?  Comment:  ?                **Please note reference interval change**  ?04/27/2012 106 (H) 65 - 99 mg/dL Final  ? ?Glucose, Bld  ?Date  Value Ref Range Status  ?07/12/2020 100 (H) 65 - 99 mg/dL Final  ?  Comment:  ?  . ?           Fasting reference interval ?. ?For someone without known diabetes, a glucose value ?between 100 and 125 mg/dL is consistent with ?prediabetes and should be confirmed with a ?follow-up test. ?. ?  ?07/27/2019 93 70 - 99 mg/dL Final  ?  Comment:  ?  Glucose reference range applies only to samples taken after fasting for at least 8 hours.  ?07/20/2019 102 (H) 65 - 99 mg/dL Final  ?  Comment:  ?  . ?           Fasting reference interval ?. ?For someone without known diabetes, a glucose value ?between 100 and 125 mg/dL is consistent with ?prediabetes and should be confirmed with a ?follow-up test. ?. ?  ? ? ?Patient Active Problem List  ? Diagnosis Date Noted  ? Low back pain of over 3 months duration 05/16/2021  ? Spondylosis without myelopathy or radiculopathy, lumbosacral region 05/16/2021  ? DDD (degenerative disc disease), cervical (Multilevel) 01/14/2021  ? Cervical facet arthropathy (Multilevel) (Bilateral) 01/14/2021  ? 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 01/14/2021  ? Retrolisthesis of lumbar spine at L1/L2 and L2/L3 01/14/2021  ? Lumbosacral facet arthropathy (Multilevel) (Bilateral) 01/14/2021  ? Osteoarthritis of hips (Bilateral) 01/14/2021  ? Osteoarthritis of wrist (Right) 01/14/2021  ? Osteoarthritis of first carpometacarpal joint of hand (Right) 01/14/2021  ? Chronic pain syndrome 11/14/2020  ? Pharmacologic therapy 11/14/2020  ? Disorder of skeletal system 11/14/2020  ? Problems influencing health status 11/14/2020  ? Chronic low back pain (1ry area of Pain) (Bilateral) (L>R) w/o sciatica 11/14/2020  ? Lumbosacral facet syndrome (Bilateral) 11/14/2020  ? Chronic wrist pain (2ry area of Pain) (Right) 11/14/2020  ? History of carpal tunnel surgery of wrist (Right) 11/14/2020  ? Chronic neck pain (3ry area of Pain) (Bilateral) (L>R) 11/14/2020  ? Cervicalgia 11/14/2020  ? Cervical facet syndrome  11/14/2020  ? Chronic hip pain (4th area of Pain) (Bilateral) 11/14/2020  ? Recurrent major depressive disorder, in remission ([place]) 02/18/2018  ? Thoracic aorta atherosclerosis ([place]) 11/10/2017  ? Centrilobular emphysema ([place]) 11/10/2017  ? Abnormal liver CT 11/10/2017  ? Inflammatory arthritis 05/07/2017  ? Current tobacco use 05/07/2017  ? Vitamin D deficiency 05/28/2016  ? Surgical menopause on hormone replacement therapy 04/23/2016  ? Status post laparoscopic assisted  vaginal hysterectomy ([facility]) 01/28/2016  ? Family history of ovarian cancer 11/13/2015  ? Insomnia 03/27/2015  ? Hypertension 08/30/2014  ? Anxiety and depression 08/30/2014  ? Cardiac murmur 08/30/2014  ? HLD (hyperlipidemia) 08/30/2014  ? DDD (degenerative disc disease), lumbar 07/26/2014  ? ? ?Past Surgical History:  ?Procedure Laterality Date  ? ABDOMINAL HYSTERECTOMY    ? BREAST BIOPSY Left   ? stereo- neg  ? BREAST CYST ASPIRATION Right   ? neg  ? BREAST SURGERY    ? benign biopsy  ? CHOLECYSTECTOMY    ? COLONOSCOPY WITH PROPOFOL N/A 10/11/2019  ? Procedure: COLONOSCOPY WITH PROPOFOL;  Surgeon: [name], MD;  Location: [facility] ENDOSCOPY;  Service: Gastroenterology;  Laterality: N/A;  ? DILATION AND CURETTAGE OF UTERUS    ? LAPAROSCOPIC LYSIS OF ADHESIONS  12/10/2015  ? Procedure: LAPAROSCOPIC LYSIS OF ADHESIONS;  Surgeon: Brayton Mars, MD;  Location: [facility] ORS;  Service: Gynecology;;  ? LAPAROSCOPIC VAGINAL HYSTERECTOMY WITH SALPINGO OOPHORECTOMY Bilateral 01/28/2016  ? Procedure: LAPAROSCOPIC ASSISTED VAGINAL HYSTERECTOMY WITH SALPINGO OOPHORECTOMY;  Surgeon: Brayton Mars, MD;  Location: [facility] ORS;  Service: Gynecology;  Laterality: Bilateral;  ? LAPAROSCOPY N/A 12/10/2015  ? Procedure: LAPAROSCOPY DIAGNOSTIC WITH BIOPSIES;  Surgeon: Brayton Mars, MD;  Location: [facility] ORS;  Service: Gynecology;  Laterality: N/A;  ? SHOULDER ARTHROSCOPY Right   ? TUBAL LIGATION    ? UTERINE FIBROID EMBOLIZATION    ? ? ?Family History   ?Problem Relation Age of Onset  ? Cancer Mother   ?     cervical, lung  ? Lung cancer Mother   ? Heart disease Father   ? Hypertension Father   ? COPD Father   ? Breast cancer Maternal Aunt   ? Ovarian cancer Ma

## 2021-06-12 ENCOUNTER — Other Ambulatory Visit: Payer: Self-pay

## 2021-06-12 ENCOUNTER — Encounter: Payer: Self-pay | Admitting: Family Medicine

## 2021-06-12 ENCOUNTER — Ambulatory Visit (INDEPENDENT_AMBULATORY_CARE_PROVIDER_SITE_OTHER): Payer: No Typology Code available for payment source | Admitting: Family Medicine

## 2021-06-12 DIAGNOSIS — E538 Deficiency of other specified B group vitamins: Secondary | ICD-10-CM

## 2021-06-12 DIAGNOSIS — J432 Centrilobular emphysema: Secondary | ICD-10-CM

## 2021-06-12 DIAGNOSIS — E559 Vitamin D deficiency, unspecified: Secondary | ICD-10-CM | POA: Diagnosis not present

## 2021-06-12 DIAGNOSIS — I10 Essential (primary) hypertension: Secondary | ICD-10-CM

## 2021-06-12 DIAGNOSIS — Z1211 Encounter for screening for malignant neoplasm of colon: Secondary | ICD-10-CM

## 2021-06-12 DIAGNOSIS — Z0000 Encounter for general adult medical examination without abnormal findings: Secondary | ICD-10-CM | POA: Diagnosis not present

## 2021-06-12 DIAGNOSIS — Z86010 Personal history of colonic polyps: Secondary | ICD-10-CM

## 2021-06-12 DIAGNOSIS — E7800 Pure hypercholesterolemia, unspecified: Secondary | ICD-10-CM | POA: Diagnosis not present

## 2021-06-12 DIAGNOSIS — Z720 Tobacco use: Secondary | ICD-10-CM

## 2021-06-12 DIAGNOSIS — Z122 Encounter for screening for malignant neoplasm of respiratory organs: Secondary | ICD-10-CM

## 2021-06-12 MED ORDER — NA SULFATE-K SULFATE-MG SULF 17.5-3.13-1.6 GM/177ML PO SOLN: 1.0000 | Freq: Once | ORAL | 0 refills | Status: AC

## 2021-06-12 MED ORDER — BUPROPION HCL ER (XL) 150 MG PO TB24: 150.0000 mg | ORAL_TABLET | Freq: Every day | ORAL | 0 refills | Status: DC

## 2021-06-12 NOTE — Progress Notes (Signed)
Gastroenterology Pre-Procedure Review ? ?Request Date: 06/26/2021 ?Requesting Physician: Dr. Vicente Males ? ? ?PATIENT REVIEW QUESTIONS: The patient responded to the following health history questions as indicated:   ? ?1. Are you having any GI issues? no ?2. Do you have a personal history of Polyps? yes (last colonoscopy ) ?3. Do you have a family history of Colon Cancer or Polyps? yes (moms brothers ) ?4. Diabetes Mellitus? no ?5. Joint replacements in the past 12 months?no ?6. Major health problems in the past 3 months?no ?7. Any artificial heart valves, MVP, or defibrillator?no ?   ?MEDICATIONS & ALLERGIES:    ?Patient reports the following regarding taking any anticoagulation/antiplatelet therapy:   ?Plavix, Coumadin, Eliquis, Xarelto, Lovenox, Pradaxa, Brilinta, or Effient? no ?Aspirin? yes ('81mg'$ ) ? ?Patient confirms/reports the following medications:  ?Current Outpatient Medications  ?Medication Sig Dispense Refill  ? albuterol (VENTOLIN HFA) 108 (90 Base) MCG/ACT inhaler TAKE 2 PUFFS BY MOUTH EVERY 6 HOURS AS NEEDED FOR WHEEZE OR SHORTNESS OF BREATH 8.5 each 1  ? amLODipine (NORVASC) 10 MG tablet Take 1 tablet (10 mg total) by mouth daily. 90 tablet 1  ? aspirin 81 MG EC tablet TAKE 1 TABLET BY MOUTH EVERY DAY 30 tablet 5  ? Budeson-Glycopyrrol-Formoterol (BREZTRI AEROSPHERE) 160-9-4.8 MCG/ACT AERO Inhale 2 puffs into the lungs 2 (two) times daily. (Patient not taking: Reported on 06/12/2021) 10.7 g 2  ? buPROPion (WELLBUTRIN XL) 150 MG 24 hr tablet Take 1 tablet (150 mg total) by mouth daily. 90 tablet 0  ? cetirizine (ZYRTEC) 10 MG tablet Take 10 mg by mouth daily.    ? Cyanocobalamin (VITAMIN B-12) 500 MCG SUBL Place 1 tablet (500 mcg total) under the tongue daily at 12 noon. 100 tablet 1  ? FLUoxetine (PROZAC) 40 MG capsule TAKE 1 CAPSULE BY MOUTH EVERY DAY 90 capsule 1  ? fluticasone (FLONASE) 50 MCG/ACT nasal spray SPRAY 2 SPRAYS INTO EACH NOSTRIL EVERY DAY 48 mL 2  ? gabapentin (NEURONTIN) 100 MG capsule Take  1-2 capsules (100-200 mg total) by mouth at bedtime. 180 capsule 1  ? hydrochlorothiazide (HYDRODIURIL) 12.5 MG tablet Take 1 tablet (12.5 mg total) by mouth daily. 90 tablet 1  ? metoprolol succinate (TOPROL-XL) 50 MG 24 hr tablet Take 1 tablet (50 mg total) by mouth daily. Take with or immediately following a meal. 90 tablet 1  ? rosuvastatin (CRESTOR) 40 MG tablet Take 1 tablet (40 mg total) by mouth daily. 90 tablet 1  ? Vitamin D, Cholecalciferol, 50 MCG (2000 UT) CAPS Take by mouth daily.    ? ?No current facility-administered medications for this visit.  ? ? ?Patient confirms/reports the following allergies:  ?Allergies  ?Allergen Reactions  ? Hydroxychloroquine Itching  ?  Pt take zyrtec to alleviate symptoms   ? Penicillin G Hives  ?  Has patient had a PCN reaction causing immediate rash, facial/tongue/throat swelling, SOB or lightheadedness with hypotension:unsure ?Has patient had a PCN reaction causing severe rash involving mucus membranes or skin necrosis:unsure ?Has patient had a PCN reaction that required hospitalization:unsure ?Has patient had a PCN reaction occurring within the last 10 years:No ?If all of the above answers are "NO", then may proceed with Cephalosporin use. ?  ? ? ?No orders of the defined types were placed in this encounter. ? ? ?AUTHORIZATION INFORMATION ?Primary Insurance: ?1D#: ?Group #: ? ?Secondary Insurance: ?1D#: ?Group #: ? ?SCHEDULE INFORMATION: ?Date: 06/26/2021 ?Time: ?Location:[facility] ? ?

## 2021-06-13 ENCOUNTER — Ambulatory Visit
Admission: RE | Admit: 2021-06-13 | Discharge: 2021-06-13 | Disposition: A | Discharge status: Home / Self Care | Payer: No Typology Code available for payment source | Source: Ambulatory Visit | Attending: Pain Medicine | Admitting: Pain Medicine

## 2021-06-13 ENCOUNTER — Encounter: Payer: Self-pay | Admitting: Pain Medicine

## 2021-06-13 ENCOUNTER — Ambulatory Visit (HOSPITAL_BASED_OUTPATIENT_CLINIC_OR_DEPARTMENT_OTHER): Payer: No Typology Code available for payment source | Admitting: Pain Medicine

## 2021-06-13 VITALS — BP 136/65 | HR 63 | Temp 97.8°F | Resp 16 | Ht 62.0 in | Wt 176.0 lb

## 2021-06-13 DIAGNOSIS — R937 Abnormal findings on diagnostic imaging of other parts of musculoskeletal system: Secondary | ICD-10-CM

## 2021-06-13 DIAGNOSIS — M47817 Spondylosis without myelopathy or radiculopathy, lumbosacral region: Secondary | ICD-10-CM

## 2021-06-13 DIAGNOSIS — M545 Low back pain, unspecified: Secondary | ICD-10-CM | POA: Diagnosis present

## 2021-06-13 DIAGNOSIS — G8929 Other chronic pain: Secondary | ICD-10-CM | POA: Insufficient documentation

## 2021-06-13 DIAGNOSIS — M5136 Other intervertebral disc degeneration, lumbar region: Secondary | ICD-10-CM

## 2021-06-13 LAB — LIPID PANEL
Cholesterol: 199 mg/dL (ref <200)
HDL: 52 mg/dL (ref >=50)
LDL Cholesterol (Calc): 121 mg/dL (calc) — ABNORMAL HIGH
Non-HDL Cholesterol (Calc): 147 mg/dL (calc) — ABNORMAL HIGH (ref <130)
Total CHOL/HDL Ratio: 3.8 (calc) (ref <5.0)
Triglycerides: 147 mg/dL (ref <150)

## 2021-06-13 LAB — CBC WITH DIFFERENTIAL/PLATELET
Absolute Monocytes: 600 cells/uL (ref 200–950)
Basophils Absolute: 48 cells/uL (ref 0–200)
Basophils Relative: 0.6 %
Eosinophils Absolute: 152 cells/uL (ref 15–500)
Eosinophils Relative: 1.9 %
HCT: 46.4 % — ABNORMAL HIGH (ref 35.0–45.0)
Hemoglobin: 15.8 g/dL — ABNORMAL HIGH (ref 11.7–15.5)
Lymphs Abs: 1944 cells/uL (ref 850–3900)
MCH: 31.1 pg (ref 27.0–33.0)
MCHC: 34.1 g/dL (ref 32.0–36.0)
MCV: 91.3 fL (ref 80.0–100.0)
MPV: 10.3 fL (ref 7.5–12.5)
Monocytes Relative: 7.5 %
Neutro Abs: 5256 cells/uL (ref 1500–7800)
Neutrophils Relative %: 65.7 %
Platelets: 334 10*3/uL (ref 140–400)
RBC: 5.08 10*6/uL (ref 3.80–5.10)
RDW: 12.8 % (ref 11.0–15.0)
Total Lymphocyte: 24.3 %
WBC: 8 10*3/uL (ref 3.8–10.8)

## 2021-06-13 LAB — COMPLETE METABOLIC PANEL WITH GFR
AG Ratio: 1.6 (calc) (ref 1.0–2.5)
ALT: 30 U/L — ABNORMAL HIGH (ref 6–29)
AST: 27 U/L (ref 10–35)
Albumin: 4.2 g/dL (ref 3.6–5.1)
Alkaline phosphatase (APISO): 88 U/L (ref 37–153)
BUN: 14 mg/dL (ref 7–25)
CO2: 27 mmol/L (ref 20–32)
Calcium: 10 mg/dL (ref 8.6–10.4)
Chloride: 103 mmol/L (ref 98–110)
Creat: 0.61 mg/dL (ref 0.50–1.03)
Globulin: 2.6 g/dL (calc) (ref 1.9–3.7)
Glucose, Bld: 94 mg/dL (ref 65–99)
Potassium: 4.8 mmol/L (ref 3.5–5.3)
Sodium: 137 mmol/L (ref 135–146)
Total Bilirubin: 0.6 mg/dL (ref 0.2–1.2)
Total Protein: 6.8 g/dL (ref 6.1–8.1)
eGFR: 104 mL/min/{1.73_m2} (ref >=60)

## 2021-06-13 LAB — B12 AND FOLATE PANEL
Folate: 10.7 ng/mL
Vitamin B-12: >2000 pg/mL — ABNORMAL HIGH (ref 200–1100)

## 2021-06-13 LAB — TEST AUTHORIZATION: TEST CODE:: 7600

## 2021-06-13 MED ORDER — LACTATED RINGERS IV SOLN
1000.0000 mL | Freq: Once | INTRAVENOUS | Status: AC
  Administered 2021-06-13: 1000 mL via INTRAVENOUS

## 2021-06-13 MED ORDER — ROPIVACAINE HCL 2 MG/ML IJ SOLN
INTRAMUSCULAR | Status: AC
  Filled 2021-06-13: qty 20

## 2021-06-13 MED ORDER — TRIAMCINOLONE ACETONIDE 40 MG/ML IJ SUSP
80.0000 mg | Freq: Once | INTRAMUSCULAR | Status: AC
  Administered 2021-06-13: 80 mg

## 2021-06-13 MED ORDER — TRIAMCINOLONE ACETONIDE 40 MG/ML IJ SUSP
INTRAMUSCULAR | Status: AC
  Filled 2021-06-13: qty 2

## 2021-06-13 MED ORDER — MIDAZOLAM HCL 5 MG/5ML IJ SOLN
0.5000 mg | Freq: Once | INTRAMUSCULAR | Status: AC
  Administered 2021-06-13: 3 mg via INTRAVENOUS

## 2021-06-13 MED ORDER — LIDOCAINE HCL 2 % IJ SOLN
20.0000 mL | Freq: Once | INTRAMUSCULAR | Status: AC
  Administered 2021-06-13: 400 mg

## 2021-06-13 MED ORDER — FENTANYL CITRATE (PF) 100 MCG/2ML IJ SOLN
25.0000 ug | INTRAMUSCULAR | Status: DC | PRN
  Administered 2021-06-13: 50 ug via INTRAVENOUS

## 2021-06-13 MED ORDER — LIDOCAINE HCL (PF) 2 % IJ SOLN
INTRAMUSCULAR | Status: AC
  Filled 2021-06-13: qty 15

## 2021-06-13 MED ORDER — LIDOCAINE HCL (PF) 2 % IJ SOLN
INTRAMUSCULAR | Status: AC
  Filled 2021-06-13: qty 5

## 2021-06-13 MED ORDER — FENTANYL CITRATE (PF) 100 MCG/2ML IJ SOLN
INTRAMUSCULAR | Status: AC
  Filled 2021-06-13: qty 2

## 2021-06-13 MED ORDER — ROPIVACAINE HCL 2 MG/ML IJ SOLN
18.0000 mL | Freq: Once | INTRAMUSCULAR | Status: AC
  Administered 2021-06-13: 18 mL via PERINEURAL

## 2021-06-13 MED ORDER — MIDAZOLAM HCL 5 MG/5ML IJ SOLN
INTRAMUSCULAR | Status: AC
  Filled 2021-06-13: qty 5

## 2021-06-13 NOTE — Patient Instructions (Signed)

## 2021-06-13 NOTE — Progress Notes (Signed)
Safety precautions to be maintained throughout the outpatient stay will include: orient to surroundings, keep bed in low position, maintain call bell within reach at all times, provide assistance with transfer out of bed and ambulation.  

## 2021-06-13 NOTE — Progress Notes (Signed)
PROVIDER NOTE: Interpretation of information contained herein should be left to medically-trained personnel. Specific patient instructions are provided elsewhere under "Patient Instructions" section of medical record. This document was created in part using STT-dictation technology, any transcriptional errors that may result from this process are unintentional.  ?Patient: Nichole Wells ?Type: Established ?DOB: 02/01/1961 ?MRN: [id number] ?PCP: Steele Sizer, MD  Service: Procedure ?DOS: 06/13/2021 ?Setting: Ambulatory ?Location: Ambulatory outpatient facility ?Delivery: Face-to-face Provider: Gaspar Cola, MD ?Specialty: Interventional Pain Management ?Specialty designation: 09 ?Location: Outpatient facility ?Ref. Prov.: Steele Sizer, MD   ? ?Primary Reason for Visit: Interventional Pain Management Treatment. ?CC: Back Pain (lower) ?  ?Procedure:          ? Type: Lumbar Facet, Medial Branch Block(s) #1  ?Laterality: Bilateral  ?Level: L2, L3, L4, L5, & S1 Medial Branch Level(s). Injecting these levels blocks the L3-4 and L5-S1 lumbar facet joints. ?Imaging: Fluoroscopic guidance ?Anesthesia: Local anesthesia (1-2% Lidocaine) ?Anxiolysis: IV Versed         ?Sedation: Moderate conscious sedation. ?DOS: 06/13/2021 ?Performed by: Gaspar Cola, MD ? ?Primary Purpose: Diagnostic/Therapeutic ?Indications: Low back pain severe enough to impact quality of life or function. ?1. Lumbosacral facet syndrome (Bilateral)   ?2. Spondylosis without myelopathy or radiculopathy, lumbosacral region   ?3. Lumbosacral facet arthropathy (Multilevel) (Bilateral)   ?4. DDD (degenerative disc disease), lumbar   ?5. Chronic low back pain (1ry area of Pain) (Bilateral) (L>R) w/o sciatica   ?6. Abnormal MRI, lumbar spine (06/03/2014)   ? ?NAS-11 Pain score:  ? Pre-procedure: 8 /10  ? Post-procedure: 0-No pain/10  ? ?  ?Position / Prep / Materials:  ?Position: Prone  ?Prep solution: DuraPrep (Iodine Povacrylex [0.7% available  iodine] and Isopropyl Alcohol, 74% w/w) ?Area Prepped: Posterolateral Lumbosacral Spine (Wide prep: From the lower border of the scapula down to the end of the tailbone and from flank to flank.) ? ?Materials:  ?Tray: Block ?Needle(s):  ?Type: Spinal  ?Gauge (G): 22  ?Length: 5-in ?Qty: 4 ? ?Pre-op H&P Assessment:  ?[name] is a 61 y.o. (year old), female patient, seen today for interventional treatment. She  has a past surgical history that includes Dilation and curettage of uterus; Cholecystectomy; Uterine fibroid embolization; Shoulder arthroscopy (Right); Tubal ligation; Breast surgery; laparoscopy (N/A, 12/10/2015); Laparoscopic lysis of adhesions (12/10/2015); Breast cyst aspiration (Right); Laparoscopic vaginal hysterectomy with salpingo oophorectomy (Bilateral, 01/28/2016); Abdominal hysterectomy; Breast biopsy (Left); and Colonoscopy with propofol (N/A, 10/11/2019). Ms. Landing has a current medication list which includes the following prescription(s): albuterol, amlodipine, aspirin, breztri aerosphere, bupropion, cetirizine, vitamin b-12, fluoxetine, fluticasone, gabapentin, hydrochlorothiazide, metoprolol succinate, rosuvastatin, and vitamin d3, and the following Facility-Administered Medications: fentanyl. Her primarily concern today is the Back Pain (lower) ? ?Initial Vital Signs:  ?Pulse/HCG Rate: 63ECG Heart Rate: 62 ?Temp: 97.6 ?F (36.4 ?C) ?Resp: 16 ?BP: (!) 148/68 ?SpO2: 96 % ? ?BMI: Estimated body mass index is 32.19 kg/m? as calculated from the following: ?  Height as of this encounter: '5\' 2"'$  (1.575 m). ?  Weight as of this encounter: 176 lb (79.8 kg). ? ?Risk Assessment: ?Allergies: Reviewed. She is allergic to hydroxychloroquine and penicillin g.  ?Allergy Precautions: None required ?Coagulopathies: Reviewed. None identified.  ?Blood-thinner therapy: None at this time ?Active Infection(s): Reviewed. None identified. [name] is afebrile ? ?Site Confirmation: [name] was asked to  confirm the procedure and laterality before marking the site ?Procedure checklist: Completed ?Consent: Before the procedure and under the influence of no sedative(s), amnesic(s), or anxiolytics, the patient was  informed of the treatment options, risks and possible complications. To fulfill our ethical and legal obligations, as recommended by the American Medical Association's Code of Ethics, I have informed the patient of my clinical impression; the nature and purpose of the treatment or procedure; the risks, benefits, and possible complications of the intervention; the alternatives, including doing nothing; the risk(s) and benefit(s) of the alternative treatment(s) or procedure(s); and the risk(s) and benefit(s) of doing nothing. ?The patient was provided information about the general risks and possible complications associated with the procedure. These may include, but are not limited to: failure to achieve desired goals, infection, bleeding, organ or nerve damage, allergic reactions, paralysis, and death. ?In addition, the patient was informed of those risks and complications associated to Spine-related procedures, such as failure to decrease pain; infection (i.e.: Meningitis, epidural or intraspinal abscess); bleeding (i.e.: epidural hematoma, subarachnoid hemorrhage, or any other type of intraspinal or peri-dural bleeding); organ or nerve damage (i.e.: Any type of peripheral nerve, nerve root, or spinal cord injury) with subsequent damage to sensory, motor, and/or autonomic systems, resulting in permanent pain, numbness, and/or weakness of one or several areas of the body; allergic reactions; (i.e.: anaphylactic reaction); and/or death. ?Furthermore, the patient was informed of those risks and complications associated with the medications. These include, but are not limited to: allergic reactions (i.e.: anaphylactic or anaphylactoid reaction(s)); adrenal axis suppression; blood sugar elevation that in diabetics  may result in ketoacidosis or comma; water retention that in patients with history of congestive heart failure may result in shortness of breath, pulmonary edema, and decompensation with resultant heart failure; weight gain; swelling or edema; medication-induced neural toxicity; particulate matter embolism and blood vessel occlusion with resultant organ, and/or nervous system infarction; and/or aseptic necrosis of one or more joints. ?Finally, the patient was informed that Medicine is not an exact science; therefore, there is also the possibility of unforeseen or unpredictable risks and/or possible complications that may result in a catastrophic outcome. The patient indicated having understood very clearly. We have given the patient no guarantees and we have made no promises. Enough time was given to the patient to ask questions, all of which were answered to the patient's satisfaction. Ms. Kendra has indicated that she wanted to continue with the procedure. ?Attestation: I, the ordering provider, attest that I have discussed with the patient the benefits, risks, side-effects, alternatives, likelihood of achieving goals, and potential problems during recovery for the procedure that I have provided informed consent. ?Date  Time: 06/13/2021  8:43 AM ? ?Pre-Procedure Preparation:  ?Monitoring: As per clinic protocol. Respiration, ETCO2, SpO2, BP, heart rate and rhythm monitor placed and checked for adequate function ?Safety Precautions: Patient was assessed for positional comfort and pressure points before starting the procedure. ?Time-out: I initiated and conducted the "Time-out" before starting the procedure, as per protocol. The patient was asked to participate by confirming the accuracy of the "Time Out" information. Verification of the correct person, site, and procedure were performed and confirmed by me, the nursing staff, and the patient. "Time-out" conducted as per Joint Commission's Universal Protocol  (UP.01.01.01). ?Time: [phone] ? ?Description of Procedure:          ?Laterality: Bilateral. The procedure was performed in identical fashion on both sides. ?Targeted Levels:  L2, L3, L4, L5, & S1 Medial Branch Level

## 2021-06-14 ENCOUNTER — Telehealth: Payer: Self-pay

## 2021-06-14 NOTE — Telephone Encounter (Signed)
Post procedure phone call follow up.  LM ?

## 2021-06-24 ENCOUNTER — Encounter: Payer: Self-pay | Admitting: Gastroenterology

## 2021-06-24 ENCOUNTER — Other Ambulatory Visit: Payer: Self-pay | Admitting: Family Medicine

## 2021-06-24 ENCOUNTER — Other Ambulatory Visit: Payer: Self-pay

## 2021-06-24 DIAGNOSIS — I10 Essential (primary) hypertension: Secondary | ICD-10-CM

## 2021-06-26 ENCOUNTER — Encounter
Admission: RE | Disposition: A | Discharge status: Home / Self Care | Payer: Self-pay | Source: Home / Self Care | Attending: Gastroenterology

## 2021-06-26 ENCOUNTER — Ambulatory Visit: Payer: No Typology Code available for payment source | Admitting: Certified Registered"

## 2021-06-26 ENCOUNTER — Encounter: Payer: Self-pay | Admitting: Gastroenterology

## 2021-06-26 ENCOUNTER — Ambulatory Visit
Admission: RE | Admit: 2021-06-26 | Discharge: 2021-06-26 | Disposition: A | Discharge status: Home / Self Care | Payer: No Typology Code available for payment source | Attending: Gastroenterology | Admitting: Gastroenterology

## 2021-06-26 DIAGNOSIS — F419 Anxiety disorder, unspecified: Secondary | ICD-10-CM | POA: Diagnosis not present

## 2021-06-26 DIAGNOSIS — F17210 Nicotine dependence, cigarettes, uncomplicated: Secondary | ICD-10-CM | POA: Insufficient documentation

## 2021-06-26 DIAGNOSIS — K635 Polyp of colon: Secondary | ICD-10-CM

## 2021-06-26 DIAGNOSIS — Z86010 Personal history of colonic polyps: Secondary | ICD-10-CM | POA: Diagnosis not present

## 2021-06-26 DIAGNOSIS — K621 Rectal polyp: Secondary | ICD-10-CM | POA: Insufficient documentation

## 2021-06-26 DIAGNOSIS — J449 Chronic obstructive pulmonary disease, unspecified: Secondary | ICD-10-CM | POA: Diagnosis not present

## 2021-06-26 DIAGNOSIS — I10 Essential (primary) hypertension: Secondary | ICD-10-CM | POA: Diagnosis not present

## 2021-06-26 DIAGNOSIS — F32A Depression, unspecified: Secondary | ICD-10-CM | POA: Insufficient documentation

## 2021-06-26 DIAGNOSIS — D124 Benign neoplasm of descending colon: Secondary | ICD-10-CM | POA: Diagnosis not present

## 2021-06-26 DIAGNOSIS — Z09 Encounter for follow-up examination after completed treatment for conditions other than malignant neoplasm: Secondary | ICD-10-CM | POA: Diagnosis present

## 2021-06-26 HISTORY — PX: COLONOSCOPY WITH PROPOFOL: SHX5780

## 2021-06-26 SURGERY — COLONOSCOPY WITH PROPOFOL
Anesthesia: General

## 2021-06-26 MED ORDER — LIDOCAINE HCL (CARDIAC) PF 100 MG/5ML IV SOSY
PREFILLED_SYRINGE | INTRAVENOUS | Status: DC | PRN
  Administered 2021-06-26: 100 mg via INTRAVENOUS

## 2021-06-26 MED ORDER — MIDAZOLAM HCL 2 MG/2ML IJ SOLN
INTRAMUSCULAR | Status: DC | PRN
  Administered 2021-06-26: 2 mg via INTRAVENOUS

## 2021-06-26 MED ORDER — SODIUM CHLORIDE 0.9 % IV SOLN
INTRAVENOUS | Status: DC
  Administered 2021-06-26: 1000 mL via INTRAVENOUS

## 2021-06-26 MED ORDER — MIDAZOLAM HCL 2 MG/2ML IJ SOLN
INTRAMUSCULAR | Status: AC
  Filled 2021-06-26: qty 2

## 2021-06-26 MED ORDER — PROPOFOL 500 MG/50ML IV EMUL
INTRAVENOUS | Status: DC | PRN
Start: 2021-06-26 — End: 2021-06-26
  Administered 2021-06-26: 165 ug/kg/min via INTRAVENOUS

## 2021-06-26 MED ORDER — PROPOFOL 10 MG/ML IV BOLUS
INTRAVENOUS | Status: DC | PRN
  Administered 2021-06-26: 10 mg via INTRAVENOUS
  Administered 2021-06-26: 20 mg via INTRAVENOUS
  Administered 2021-06-26: 60 mg via INTRAVENOUS
  Administered 2021-06-26: 10 mg via INTRAVENOUS

## 2021-06-26 NOTE — Transfer of Care (Signed)
Immediate Anesthesia Transfer of Care Note ? ?Patient: Nichole Wells ? ?Procedure(s) Performed: COLONOSCOPY WITH PROPOFOL ? ?Patient Location: Endoscopy Unit ? ?Anesthesia Type:General ? ?Level of Consciousness: awake, drowsy and patient cooperative ? ?Airway & Oxygen Therapy: Patient Spontanous Breathing and Patient connected to face mask oxygen ? ?Post-op Assessment: Report given to RN and Post -op Vital signs reviewed and stable ? ?Post vital signs: Reviewed and stable ? ?Last Vitals:  ?Vitals Value Taken Time  ?BP    ?Temp    ?Pulse 74 06/26/21 1004  ?Resp 21 06/26/21 1004  ?SpO2 99 % 06/26/21 1004  ?Vitals shown include unvalidated device data. ? ?Last Pain:  ?Vitals:  ? 06/26/21 0919  ?TempSrc: Temporal  ?PainSc: 0-No pain  ?   ? ?  ? ?Complications: No notable events documented. ?

## 2021-06-26 NOTE — Anesthesia Preprocedure Evaluation (Signed)
Anesthesia Evaluation  ?Patient identified by MRN, date of birth, ID band ?Patient awake ? ? ? ?Reviewed: ?Allergy & Precautions, H&P , NPO status , Patient's Chart, lab work & pertinent test results, reviewed documented beta blocker date and time  ? ?Airway ?Mallampati: II ? ? ?Neck ROM: full ? ? ? Dental ? ?(+) Poor Dentition ?  ?Pulmonary ?COPD,  COPD inhaler, Current Smoker,  ?  ?Pulmonary exam normal ? ? ? ? ? ? ? Cardiovascular ?Exercise Tolerance: Poor ?hypertension, On Medications ?Normal cardiovascular exam+ Valvular Problems/Murmurs  ?Rhythm:regular Rate:Normal ? ? ?  ?Neuro/Psych ?PSYCHIATRIC DISORDERS Anxiety Depression negative neurological ROS ?   ? GI/Hepatic ?Neg liver ROS, GERD  Medicated,  ?Endo/Other  ?negative endocrine ROS ? Renal/GU ?negative Renal ROS  ?negative genitourinary ?  ?Musculoskeletal ? ? Abdominal ?  ?Peds ? Hematology ?negative hematology ROS ?(+)   ?Anesthesia Other Findings ?Past Medical History: ?No date: Anxiety ?No date: Arthritis ?    Comment:  poss ra ?No date: GERD (gastroesophageal reflux disease) ?    Comment:  occ ?No date: Heart murmur ?No date: Hypertension ?No date: Inflammatory arthritis ?No date: Vitamin D deficiency ?Past Surgical History: ?No date: BREAST BIOPSY; Left ?    Comment:  stereo- neg ?No date: BREAST CYST ASPIRATION; Right ?    Comment:  neg ?No date: BREAST SURGERY ?    Comment:  benign biopsy ?No date: CHOLECYSTECTOMY ?10/11/2019: COLONOSCOPY WITH PROPOFOL; N/A ?    Comment:  Procedure: COLONOSCOPY WITH PROPOFOL;  Surgeon: Vicente Males,  ?             Bailey Mech, MD;  Location: [facility];  Service:  ?             Gastroenterology;  Laterality: N/A; ?No date: DILATION AND CURETTAGE OF UTERUS ?12/10/2015: LAPAROSCOPIC LYSIS OF ADHESIONS ?    Comment:  Procedure: LAPAROSCOPIC LYSIS OF ADHESIONS;  Surgeon:  ?             Brayton Mars, MD;  Location: [facility];  Service:  ?             Gynecology;; ?01/28/2016:  LAPAROSCOPIC VAGINAL HYSTERECTOMY WITH SALPINGO  ?OOPHORECTOMY; Bilateral ?    Comment:  Procedure: LAPAROSCOPIC ASSISTED VAGINAL HYSTERECTOMY  ?             WITH SALPINGO OOPHORECTOMY;  Surgeon: Hassell Done A  ?             Defrancesco, MD;  Location: [facility];  Service:  ?             Gynecology;  Laterality: Bilateral; ?12/10/2015: LAPAROSCOPY; N/A ?    Comment:  Procedure: LAPAROSCOPY DIAGNOSTIC WITH BIOPSIES;   ?             Surgeon: Brayton Mars, MD;  Location: [facility];   ?             Service: Gynecology;  Laterality: N/A; ?No date: SHOULDER ARTHROSCOPY; Right ?No date: TUBAL LIGATION ?No date: UTERINE FIBROID EMBOLIZATION ?BMI   ? Body Mass Index: 32.26 kg/m?  ?  ? Reproductive/Obstetrics ?negative OB ROS ? ?  ? ? ? ? ? ? ? ? ? ? ? ? ? ?  ?  ? ? ? ? ? ? ? ? ?Anesthesia Physical ?Anesthesia Plan ? ?ASA: 3 ? ?Anesthesia Plan: General  ? ?Post-op Pain Management:   ? ?Induction:  ? ?PONV Risk Score and Plan:  ? ?Airway Management Planned:  ? ?Additional Equipment:  ? ?  Intra-op Plan:  ? ?Post-operative Plan:  ? ?Informed Consent: I have reviewed the patients History and Physical, chart, labs and discussed the procedure including the risks, benefits and alternatives for the proposed anesthesia with the patient or authorized representative who has indicated his/her understanding and acceptance.  ? ? ? ?Dental Advisory Given ? ?Plan Discussed with: CRNA ? ?Anesthesia Plan Comments:   ? ? ? ? ? ? ?Anesthesia Quick Evaluation ? ?

## 2021-06-26 NOTE — Anesthesia Postprocedure Evaluation (Signed)
Anesthesia Post Note ? ?Patient: Nichole Wells ? ?Procedure(s) Performed: COLONOSCOPY WITH PROPOFOL ? ?Patient location during evaluation: PACU ?Anesthesia Type: General ?Level of consciousness: awake and alert ?Pain management: pain level controlled ?Vital Signs Assessment: post-procedure vital signs reviewed and stable ?Respiratory status: spontaneous breathing, nonlabored ventilation, respiratory function stable and patient connected to nasal cannula oxygen ?Cardiovascular status: blood pressure returned to baseline and stable ?Postop Assessment: no apparent nausea or vomiting ?Anesthetic complications: no ? ? ?No notable events documented. ? ? ?Last Vitals:  ?Vitals:  ? 06/26/21 0919 06/26/21 1004  ?BP: (!) 182/76 124/77  ?Pulse: (!) 59   ?Resp: 16   ?Temp: (!) 35.9 ?C   ?SpO2: 98%   ?  ?Last Pain:  ?Vitals:  ? 06/26/21 1047  ?TempSrc:   ?PainSc: 0-No pain  ? ? ?  ?  ?  ?  ?  ?  ? ?Molli Barrows ? ? ? ? ?

## 2021-06-26 NOTE — Op Note (Signed)
[facility] ?Gastroenterology ?Patient Name: Nichole Wells ?Procedure Date: 06/26/2021 9:32 AM ?MRN: [id number] ?Account #: [id number] ?Date of Birth: [birth year] ?Admit Type: Outpatient ?Age: 61 ?Room: [facility] ENDO ROOM 3 ?Gender: Female ?Note Status: Finalized ?Instrument Name: Colonoscope [id number] ?Procedure:             Colonoscopy ?Indications:           Surveillance: Piecemeal removal of large sessile  ?                       adenoma last colonoscopy (< 3 yrs) ?Providers:             [name] MD, MD ?Referring MD:          Bethena Roys. Ancil Boozer, MD (Referring MD) ?Medicines:             Monitored Anesthesia Care ?Complications:         No immediate complications. ?Procedure:             Pre-Anesthesia Assessment: ?                       - Prior to the procedure, a History and Physical was  ?                       performed, and patient medications, allergies and  ?                       sensitivities were reviewed. The patient's tolerance  ?                       of previous anesthesia was reviewed. ?                       - The risks and benefits of the procedure and the  ?                       sedation options and risks were discussed with the  ?                       patient. All questions were answered and informed  ?                       consent was obtained. ?                       - ASA Grade Assessment: II - A patient with mild  ?                       systemic disease. ?                       After obtaining informed consent, the colonoscope was  ?                       passed under direct vision. Throughout the procedure,  ?                       the patient's blood pressure, pulse, and oxygen  ?  saturations were monitored continuously. The  ?                       Colonoscope was introduced through the anus and  ?                       advanced to the the cecum, identified by the  ?                       appendiceal orifice. The colonoscopy was performed  ?                        with ease. The patient tolerated the procedure well.  ?                       The quality of the bowel preparation was excellent. ?Findings: ?     The perianal and digital rectal examinations were normal. ?     Three sessile polyps were found in the rectum. The polyps were 4 to 5 mm  ?     in size. These polyps were removed with a cold snare. Resection and  ?     retrieval were complete. ?     Multiple small and large-mouthed diverticula were found in the sigmoid  ?     colon. ?     A 5 mm polyp was found in the descending colon. The polyp was sessile.  ?     The polyp was removed with a cold snare. Resection and retrieval were  ?     complete. ?     The exam was otherwise without abnormality on direct and retroflexion  ?     views. ?Impression:            - Three 4 to 5 mm polyps in the rectum, removed with a  ?                       cold snare. Resected and retrieved. ?                       - Diverticulosis in the sigmoid colon. ?                       - One 5 mm polyp in the descending colon, removed with  ?                       a cold snare. Resected and retrieved. ?                       - The examination was otherwise normal on direct and  ?                       retroflexion views. ?Recommendation:        - Discharge patient to home (with escort). ?                       - Resume previous diet. ?                       - Continue present medications. ?                       -  Await pathology results. ?                       - Repeat colonoscopy in 3 years for surveillance. ?Procedure Code(s):     --- Professional --- ?                       [phone], Colonoscopy, flexible; with removal of  ?                       tumor(s), polyp(s), or other lesion(s) by snare  ?                       technique ?Diagnosis Code(s):     --- Professional --- ?                       Z86.010, Personal history of colonic polyps ?                       K62.1, Rectal polyp ?                       K63.5, Polyp of colon ?                        K57.30, Diverticulosis of large intestine without  ?                       perforation or abscess without bleeding ?CPT copyright 2019 [employer]. All rights reserved. ?The codes documented in this report are preliminary and upon coder review may  ?be revised to meet current compliance requirements. ?[name], MD ?[name] MD, MD ?06/26/2021 10:02:17 AM ?This report has been signed electronically. ?Number of Addenda: 0 ?Note Initiated On: 06/26/2021 9:32 AM ?Scope Withdrawal Time: 0 hours 9 minutes 42 seconds  ?Total Procedure Duration: 0 hours 18 minutes 38 seconds  ?Estimated Blood Loss:  Estimated blood loss: none. ?     [facility] ?

## 2021-06-26 NOTE — H&P (Signed)
?Nichole Bellows, MD ?[address] [place], [place], [place], [address] ?[address], [place], [place], [place], [address] ?Phone: [phone]  ?Fax: [phone] ? ?Primary Care Physician:  Nichole Sizer, MD ? ? ?Pre-Procedure History & Physical: ?HPI:  Nichole Wells is a 61 y.o. female is here for an colonoscopy. ?  ?Past Medical History:  ?Diagnosis Date  ? Anxiety   ? Arthritis   ? poss ra  ? GERD (gastroesophageal reflux disease)   ? occ  ? Heart murmur   ? Hypertension   ? Inflammatory arthritis   ? Vitamin D deficiency   ? ? ?Past Surgical History:  ?Procedure Laterality Date  ? BREAST BIOPSY Left   ? stereo- neg  ? BREAST CYST ASPIRATION Right   ? neg  ? BREAST SURGERY    ? benign biopsy  ? CHOLECYSTECTOMY    ? COLONOSCOPY WITH PROPOFOL N/A 10/11/2019  ? Procedure: COLONOSCOPY WITH PROPOFOL;  Surgeon: Nichole Bellows, MD;  Location: [facility] ENDOSCOPY;  Service: Gastroenterology;  Laterality: N/A;  ? DILATION AND CURETTAGE OF UTERUS    ? LAPAROSCOPIC LYSIS OF ADHESIONS  12/10/2015  ? Procedure: LAPAROSCOPIC LYSIS OF ADHESIONS;  Surgeon: Nichole Mars, MD;  Location: [facility] ORS;  Service: Gynecology;;  ? LAPAROSCOPIC VAGINAL HYSTERECTOMY WITH SALPINGO OOPHORECTOMY Bilateral 01/28/2016  ? Procedure: LAPAROSCOPIC ASSISTED VAGINAL HYSTERECTOMY WITH SALPINGO OOPHORECTOMY;  Surgeon: Nichole Mars, MD;  Location: [facility] ORS;  Service: Gynecology;  Laterality: Bilateral;  ? LAPAROSCOPY N/A 12/10/2015  ? Procedure: LAPAROSCOPY DIAGNOSTIC WITH BIOPSIES;  Surgeon: Nichole Mars, MD;  Location: [facility] ORS;  Service: Gynecology;  Laterality: N/A;  ? SHOULDER ARTHROSCOPY Right   ? TUBAL LIGATION    ? UTERINE FIBROID EMBOLIZATION    ? ? ?Prior to Admission medications   ?Medication Sig Start Date End Date Taking? Authorizing Provider  ?amLODipine (NORVASC) 10 MG tablet TAKE 1 TABLET BY MOUTH EVERY DAY 06/25/21  Yes Nichole Wells, Nichole Stager, MD  ?aspirin 81 MG EC tablet TAKE 1 TABLET BY MOUTH EVERY DAY 01/23/19  Yes  Nichole Wells, Nichole Stager, MD  ?cetirizine (ZYRTEC) 10 MG tablet Take 10 mg by mouth daily.   Yes [provider]  ?Cyanocobalamin (VITAMIN B-12) 500 MCG SUBL Place 1 tablet (500 mcg total) under the tongue daily at 12 noon. 01/23/21  Yes Nichole Wells, Nichole Stager, MD  ?FLUoxetine (PROZAC) 40 MG capsule TAKE 1 CAPSULE BY MOUTH EVERY DAY 01/23/21  Yes Nichole Wells, Nichole Stager, MD  ?fluticasone (FLONASE) 50 MCG/ACT nasal spray SPRAY 2 SPRAYS INTO EACH NOSTRIL EVERY DAY 11/01/20  Yes Nichole Wells, Nichole Stager, MD  ?gabapentin (NEURONTIN) 100 MG capsule Take 1-2 capsules (100-200 mg total) by mouth at bedtime. 01/23/21  Yes Nichole Wells, Nichole Stager, MD  ?hydrochlorothiazide (HYDRODIURIL) 12.5 MG tablet Take 1 tablet (12.5 mg total) by mouth daily. 01/23/21  Yes Nichole Wells, Nichole Stager, MD  ?metoprolol succinate (TOPROL-XL) 50 MG 24 hr tablet Take 1 tablet (50 mg total) by mouth daily. Take with or immediately following a meal. 01/23/21  Yes Nichole Wells, Nichole Stager, MD  ?rosuvastatin (CRESTOR) 40 MG tablet Take 1 tablet (40 mg total) by mouth daily. 01/23/21  Yes Nichole Sizer, MD  ?Vitamin D, Cholecalciferol, 50 MCG (2000 UT) CAPS Take by mouth daily.   Yes [provider]  ?albuterol (VENTOLIN HFA) 108 (90 Base) MCG/ACT inhaler TAKE 2 PUFFS BY MOUTH EVERY 6 HOURS AS NEEDED FOR WHEEZE OR SHORTNESS OF BREATH 01/17/21   Nichole Sizer, MD  ?Budeson-Glycopyrrol-Formoterol (BREZTRI AEROSPHERE) 160-9-4.8 MCG/ACT AERO Inhale 2 puffs into the lungs 2 (two) times daily. 12/26/20  Nichole Sizer, MD  ?buPROPion (WELLBUTRIN XL) 150 MG 24 hr tablet Take 1 tablet (150 mg total) by mouth daily. 06/12/21   Nichole Sizer, MD  ? ? ?Allergies as of 06/12/2021 - Review Complete 06/12/2021  ?Allergen Reaction Noted  ? Hydroxychloroquine Itching 07/12/2015  ? Penicillin g Hives 08/30/2014  ? ? ?Family History  ?Problem Relation Age of Onset  ? Cancer Mother   ?     cervical, lung  ? Lung cancer Mother   ? Heart disease Father   ? Hypertension Father   ? COPD Father   ? Breast  cancer Maternal Aunt   ? Ovarian cancer Maternal Aunt   ? Spina bifida Sister   ? Diabetes Son   ? Diabetes Maternal Grandmother   ? Heart disease Maternal Grandmother   ? Parkinson's disease Paternal Grandfather   ? ? ?Social History  ? ?Socioeconomic History  ? Marital status: Married  ?  Spouse name: Nichole Wells   ? Number of children: 2  ? Years of education: Not on file  ? Highest education level: Some college, no degree  ?Occupational History  ? Occupation: Freight forwarder  ?Tobacco Use  ? Smoking status: Every Day  ?  Packs/day: 1.00  ?  Years: 37.00  ?  Pack years: 37.00  ?  Types: Cigarettes  ?  Start date: 10/06/1987  ? Smokeless tobacco: Never  ?Vaping Use  ? Vaping Use: Never used  ?Substance and Sexual Activity  ? Alcohol use: Yes  ?  Alcohol/week: 0.0 standard drinks  ?  Comment: none last 24hr  ? Drug use: No  ? Sexual activity: Yes  ?  Partners: Male  ?  Birth control/protection: Surgical  ?Other Topics Concern  ? Not on file  ?Social History Narrative  ? Married  ? Works full time  ? Has 2 grown children   ? ?Social Determinants of Health  ? ?Financial Resource Strain: Low Risk   ? Difficulty of Paying Living Expenses: Not hard at all  ?Food Insecurity: No Food Insecurity  ? Worried About Charity fundraiser in the Last Year: Never true  ? Ran Out of Food in the Last Year: Never true  ?Transportation Needs: No Transportation Needs  ? Lack of Transportation (Medical): No  ? Lack of Transportation (Non-Medical): No  ?Physical Activity: Inactive  ? Days of Exercise per Week: 0 days  ? Minutes of Exercise per Session: 0 min  ?Stress: No Stress Concern Present  ? Feeling of Stress : Not at all  ?Social Connections: Socially Isolated  ? Frequency of Communication with Friends and Family: Once a week  ? Frequency of Social Gatherings with Friends and Family: Once a week  ? Attends Religious Services: Never  ? Active Member of Clubs or Organizations: No  ? Attends Archivist Meetings: Never  ? Marital Status:  Married  ?Intimate Partner Violence: Not At Risk  ? Fear of Current or Ex-Partner: No  ? Emotionally Abused: No  ? Physically Abused: No  ? Sexually Abused: No  ? ? ?Review of Systems: ?See HPI, otherwise negative ROS ? ?Physical Exam: ?BP (!) 182/76   Pulse (!) 59   Temp (!) 96.7 ?F (35.9 ?C) (Temporal)   Resp 16   Ht '5\' 2"'$  (1.575 m)   Wt 80 kg   LMP 12/24/2015 (Approximate)   SpO2 98%   BMI 32.26 kg/m?  ?General:   Alert,  pleasant and cooperative in NAD ?Head:  Normocephalic and atraumatic. ?Neck:  Supple; no masses or thyromegaly. ?Lungs:  Clear throughout to auscultation, normal respiratory effort.    ?Heart:  +S1, +S2, Regular rate and rhythm, No edema. ?Abdomen:  Soft, nontender and nondistended. Normal bowel sounds, without guarding, and without rebound.   ?Neurologic:  Alert and  oriented x4;  grossly normal neurologically. ? ?Impression/Plan: ?Nichole Wells is here for an colonoscopy to be performed for surveillance due to prior history of colon polyps  ? ?Risks, benefits, limitations, and alternatives regarding  colonoscopy have been reviewed with the patient.  Questions have been answered.  All parties agreeable. ? ? ?Nichole Bellows, MD  06/26/2021, 9:34 AM ? ?

## 2021-06-26 NOTE — Anesthesia Procedure Notes (Signed)
Procedure Name: General with mask airway ?Date/Time: 06/26/2021 10:07 AM ?Performed by: Kelton Pillar, CRNA ?Pre-anesthesia Checklist: Patient identified, Emergency Drugs available, Suction available and Patient being monitored ?Patient Re-evaluated:Patient Re-evaluated prior to induction ?Oxygen Delivery Method: Simple face mask ?Induction Type: IV induction ?Placement Confirmation: positive ETCO2 and CO2 detector ?Dental Injury: Teeth and Oropharynx as per pre-operative assessment  ? ? ? ? ?

## 2021-06-27 ENCOUNTER — Encounter: Payer: Self-pay | Admitting: Gastroenterology

## 2021-06-27 LAB — SURGICAL PATHOLOGY

## 2021-06-28 ENCOUNTER — Encounter: Payer: Self-pay | Admitting: Gastroenterology

## 2021-07-05 ENCOUNTER — Encounter: Payer: Self-pay | Admitting: Family Medicine

## 2021-07-08 NOTE — Progress Notes (Unsigned)
PROVIDER NOTE: Information contained herein reflects review and annotations entered in association with encounter. Interpretation of such information and data should be left to medically-trained personnel. Information provided to patient can be located elsewhere in the medical record under "Patient Instructions". Document created using STT-dictation technology, any transcriptional errors that may result from process are unintentional.    Patient: Nichole Wells  Service Category: E/M  Provider: Gaspar Cola, MD  DOB: [birth year]  DOS: 07/11/2021  Specialty: Interventional Pain Management  MRN: [id number]  Setting: Ambulatory outpatient  PCP: Steele Sizer, MD  Type: Established Patient    Referring Provider: Steele Sizer, MD  Location: Office  Delivery: Face-to-face     HPI  [name], a 61 y.o. year old female, is here today because of her No primary diagnosis found.. [name] primary complain today is No chief complaint on file. Last encounter: My last encounter with her was on 06/13/2021. Pertinent problems: 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 Pain Assessment: Severity of   is reported as a  /10. Location:    / . Onset:  . Quality:  . Timing:  . Modifying factor(s):  Marland Kitchen Vitals:  vitals were not taken for this visit.   Reason for encounter: post-procedure evaluation and assessment. ***  Post-procedure evaluation   Type: Lumbar Facet, Medial Branch Block(s) #1  Laterality: Bilateral  Level: L2, L3, L4, L5, & S1 Medial Branch Level(s). Injecting these levels blocks the L3-4 and L5-S1 lumbar facet joints. Imaging: Fluoroscopic guidance Anesthesia: Local anesthesia (1-2% Lidocaine) Anxiolysis: IV Versed         Sedation: Moderate conscious sedation. DOS: 06/13/2021 Performed by: Gaspar Cola, MD  Primary Purpose: Diagnostic/Therapeutic Indications: Low back pain severe enough to impact quality of life or function. 1. Lumbosacral facet syndrome (Bilateral)   2. Spondylosis without myelopathy or radiculopathy, lumbosacral region   3. Lumbosacral facet arthropathy (Multilevel) (Bilateral)   4. DDD (degenerative disc disease), lumbar   5. Chronic low back pain (1ry area of Pain) (Bilateral) (L>R) w/o sciatica   6. Abnormal MRI, lumbar spine (06/03/2014)    NAS-11 Pain score:   Pre-procedure: 8 /10   Post-procedure: 0-No pain/10      Effectiveness:  Initial hour after procedure:   ***. Subsequent 4-6 hours post-procedure:   ***. Analgesia past initial 6 hours:   ***. Ongoing improvement:  Analgesic:  *** Function:  ***  ROM:  ***   Pharmacotherapy Assessment  Analgesic: None MME/day: 0 mg/day   Monitoring: Start PMP: PDMP reviewed during this encounter.       Pharmacotherapy: No side-effects or adverse reactions reported. Compliance: No problems identified. Effectiveness: Clinically acceptable.  No notes on file  UDS:  Summary  Date  Value Ref Range Status   11/14/2020 Note  Final    Comment:    ==================================================================== Compliance Drug Analysis, Ur ==================================================================== Test                             Result       Flag       Units  Drug Present   Fluoxetine                     PRESENT   Norfluoxetine                  PRESENT    Norfluoxetine is an expected metabolite of fluoxetine.    Naproxen                       PRESENT   Metoprolol                     PRESENT ==================================================================== Test                      Result    Flag   Units      Ref Range   Creatinine              63               mg/dL      >=20 ==================================================================== Declared Medications:  Medication list was not provided. ==================================================================== For clinical consultation, please call [phone]. ====================================================================      ROS  Constitutional: Denies any fever or chills Gastrointestinal: No reported hemesis, hematochezia, vomiting, or acute GI distress Musculoskeletal: Denies any acute onset joint swelling, redness, loss of ROM, or weakness Neurological: No reported episodes of acute onset apraxia, aphasia, dysarthria, agnosia, amnesia, paralysis, loss of coordination, or loss of consciousness  Medication Review  Budeson-Glycopyrrol-Formoterol, FLUoxetine, Vitamin B-12, albuterol, amLODipine, aspirin EC, buPROPion, cetirizine, fluticasone, gabapentin, hydrochlorothiazide, metoprolol succinate, rosuvastatin, and vitamin D3  History Review  Allergy: [name] is allergic to hydroxychloroquine and penicillin g. Drug: [name]  reports no history of drug use. Alcohol:  reports current alcohol use. Tobacco:  reports that she has been smoking cigarettes. She started smoking about 33 years  ago. She has a 37.00 pack-year smoking history. She has never used smokeless tobacco. Social: [name]  reports that she has been smoking cigarettes. She started smoking about 33 years ago. She has a 37.00 pack-year smoking history. She has never used smokeless tobacco. She reports current alcohol use. She reports that she does not use drugs. Medical:  has a past medical history of Anxiety, Arthritis, GERD (gastroesophageal reflux disease), Heart murmur, Hypertension, Inflammatory arthritis, and Vitamin D deficiency. Surgical: [name]  has a past surgical history that includes Dilation and curettage of uterus; Cholecystectomy; Uterine fibroid embolization; Shoulder arthroscopy (Right); Tubal ligation; Breast surgery; laparoscopy (N/A, 12/10/2015); Laparoscopic lysis of adhesions (12/10/2015); Breast cyst aspiration (Right); Laparoscopic vaginal hysterectomy with salpingo oophorectomy (Bilateral, 01/28/2016); Breast biopsy (Left); Colonoscopy with propofol (N/A, 10/11/2019); and Colonoscopy with propofol (N/A, 06/26/2021). Family: family history includes Breast cancer in her maternal aunt; COPD in her father; Cancer in her mother; Diabetes in her maternal grandmother and son; Heart disease in her father and maternal grandmother; Hypertension in her father; Lung cancer in her mother; Ovarian cancer in her maternal aunt; Parkinson's disease in her paternal grandfather; Spina bifida in her sister.  Laboratory Chemistry Profile  Renal Lab Results  Component Value Date   BUN 14 06/12/2021   CREATININE 0.61 32/20/2542   BCR NOT APPLICABLE 70/62/3762   GFRAA 124 07/12/2020   GFRNONAA 107 07/12/2020    Hepatic Lab Results  Component Value Date   AST 27 06/12/2021   ALT 30 (H) 06/12/2021   ALBUMIN 4.4 11/14/2020   ALKPHOS 109 11/14/2020   HCVAB NON-REACTIVE 09/23/2016   LIPASE 36 04/29/2015    Electrolytes Lab Results  Component Value Date   NA 137 06/12/2021   K 4.8 06/12/2021   CL 103  06/12/2021   CALCIUM 10.0 06/12/2021   MG 1.9 11/14/2020    Bone Lab Results  Component Value Date   VD25OH 53 07/20/2019   25OHVITD1 47 11/14/2020   25OHVITD2 <1.0 11/14/2020   25OHVITD3 46 11/14/2020    Inflammation (CRP: Acute Phase) (ESR: Chronic Phase) Lab Results  Component Value Date   CRP 2 11/14/2020   ESRSEDRATE 14 11/14/2020         Note: Above Lab results reviewed.  Recent Imaging Review  DG PAIN CLINIC C-ARM 1-60 MIN NO REPORT Fluoro was used, but no Radiologist interpretation will be provided.  Please refer to "NOTES" tab for provider progress note. Note: Reviewed        Physical Exam  General appearance: Well nourished, well developed, and well hydrated. In no apparent acute distress Mental status: Alert, oriented x 3 (person, place, & time)       Respiratory: No evidence of acute respiratory distress Eyes: PERLA Vitals: LMP 12/24/2015 (Approximate)  BMI: Estimated body mass index is 32.26 kg/m as calculated from the following:   Height as of 06/26/21: '5\' 2"'  (1.575 m).   Weight as of 06/26/21: 176 lb 5.9 oz (80 kg). Ideal: Ideal body weight: 50.1 kg (110 lb 7.2 oz) Adjusted ideal body weight: 62.1 kg (136 lb 13.1 oz)  Assessment   Diagnosis Status  No diagnosis found. Controlled Controlled Controlled   Updated Problems: No problems updated.  Plan of Care  Problem-specific:  No problem-specific Assessment & Plan notes found for this encounter.  [name] has a current medication list which includes the following long-term medication(s): albuterol, amlodipine, bupropion, fluoxetine, fluticasone, gabapentin, hydrochlorothiazide, metoprolol succinate, and rosuvastatin.  Pharmacotherapy (Medications Ordered): No orders of the defined types were placed in this encounter.  Orders:  No orders of the defined types were placed in this encounter.  Follow-up plan:   No follow-ups on file.     Interventional Therapies  Risk  Complexity  Considerations:   Estimated body mass index is 32.74 kg/m as calculated from the following:   Height as of this encounter: '5\' 2"'  (1.575 m).   Weight as of this encounter: 179 lb (81.2 kg). WNL   Planned  Pending:   Diagnostic bilateral lumbar facet MBB #1    Under consideration:   Diagnostic lumbar facet MBB    Completed:   None at this time   Completed by Dr. Sharlet Salina [facility]):   1.  Bilateral L5-S1 transforaminal ESI (07/07/2014) 2.  Left L5-S1 transforaminal ESI + right S1 TFESI (07/31/2014)    Therapeutic  Palliative (PRN) options:   None established      Recent Visits Date Type Provider Dept  06/13/21 Procedure visit Milinda Pointer, MD [facility]  05/16/21 Office Visit Milinda Pointer, MD [facility]  05/06/21 Office Visit Milinda Pointer, MD [facility]  Showing recent visits within past 90 days and meeting all other  requirements Future Appointments Date Type Provider Dept  07/11/21 Appointment Milinda Pointer, MD [facility]  Showing future appointments within next 90 days and meeting all other requirements  I discussed the assessment and treatment plan with the patient. The patient was provided an opportunity to ask questions and all were answered. The patient agreed with the plan and demonstrated an understanding of the instructions.  Patient advised to call back or seek an in-person evaluation if the symptoms or condition worsens.  Duration of encounter: *** minutes.  Note by: Gaspar Cola, MD Date: 07/11/2021; Time: 3:23 PM

## 2021-07-11 ENCOUNTER — Ambulatory Visit: Payer: No Typology Code available for payment source | Attending: Pain Medicine | Admitting: Pain Medicine

## 2021-07-11 ENCOUNTER — Encounter: Payer: Self-pay | Admitting: Pain Medicine

## 2021-07-11 VITALS — BP 177/78 | HR 70 | Temp 97.2°F | Resp 16 | Ht 62.0 in | Wt 176.0 lb

## 2021-07-11 DIAGNOSIS — M47817 Spondylosis without myelopathy or radiculopathy, lumbosacral region: Secondary | ICD-10-CM | POA: Diagnosis not present

## 2021-07-11 DIAGNOSIS — G894 Chronic pain syndrome: Secondary | ICD-10-CM | POA: Insufficient documentation

## 2021-07-11 DIAGNOSIS — M545 Low back pain, unspecified: Secondary | ICD-10-CM | POA: Diagnosis not present

## 2021-07-11 DIAGNOSIS — G8929 Other chronic pain: Secondary | ICD-10-CM | POA: Insufficient documentation

## 2021-07-11 NOTE — Progress Notes (Signed)
Safety precautions to be maintained throughout the outpatient stay will include: orient to surroundings, keep bed in low position, maintain call bell within reach at all times, provide assistance with transfer out of bed and ambulation.  

## 2021-07-11 NOTE — Patient Instructions (Signed)
______________________________________________________________________  Preparing for Procedure with Sedation  NOTICE: Due to recent regulatory changes, starting on September 10, 2020, procedures requiring intravenous (IV) sedation will no longer be performed at the [facility].  These types of procedures are required to be performed at [facility] ambulatory surgery facility.  We are very sorry for the inconvenience.  Procedure appointments are limited to planned procedures: No Prescription Refills. No disability issues will be discussed. No medication changes will be discussed.  Instructions: Oral Intake: Do not eat or drink anything for at least 8 hours prior to your procedure. (Exception: Blood Pressure Medication. See below.) Transportation: A driver is required. You may not drive yourself after the procedure. Blood Pressure Medicine: Do not forget to take your blood pressure medicine with a sip of water the morning of the procedure. If your Diastolic (lower reading) is above 100 mmHg, elective cases will be cancelled/rescheduled. Blood thinners: These will need to be stopped for procedures. Notify our staff if you are taking any blood thinners. Depending on which one you take, there will be specific instructions on how and when to stop it. Diabetics on insulin: Notify the staff so that you can be scheduled 1st case in the morning. If your diabetes requires high dose insulin, take only  of your normal insulin dose the morning of the procedure and notify the staff that you have done so. Preventing infections: Shower with an antibacterial soap the morning of your procedure. Build-up your immune system: Take 1000 mg of Vitamin C with every meal (3 times a day) the day prior to your procedure. Antibiotics: Inform the staff if you have a condition or reason that requires you to take antibiotics before dental procedures. Pregnancy: If you are pregnant, call and cancel the procedure. Sickness: If  you have a cold, fever, or any active infections, call and cancel the procedure. Arrival: You must be in the facility at least 30 minutes prior to your scheduled procedure. Children: Do not bring children with you. Dress appropriately: There is always the possibility that your clothing may get soiled. Valuables: Do not bring any jewelry or valuables.  Reasons to call and reschedule or cancel your procedure: (Following these recommendations will minimize the risk of a serious complication.) Surgeries: Avoid having procedures within 2 weeks of any surgery. (Avoid for 2 weeks before or after any surgery). Flu Shots: Avoid having procedures within 2 weeks of a flu shots. (Avoid for 2 weeks before or after immunizations). Barium: Avoid having a procedure within 7-10 days after having had a radiological study involving the use of radiological contrast. (Myelograms, Barium swallow or enema study). Heart attacks: Avoid any elective procedures or surgeries for the initial 6 months after a "Myocardial Infarction" (Heart Attack). Blood thinners: It is imperative that you stop these medications before procedures. Let us know if you if you take any blood thinner.  Infection: Avoid procedures during or within two weeks of an infection (including chest colds or gastrointestinal problems). Symptoms associated with infections include: Localized redness, fever, chills, night sweats or profuse sweating, burning sensation when voiding, cough, congestion, stuffiness, runny nose, sore throat, diarrhea, nausea, vomiting, cold or Flu symptoms, recent or current infections. It is specially important if the infection is over the area that we intend to treat. Heart and lung problems: Symptoms that may suggest an active cardiopulmonary problem include: cough, chest pain, breathing difficulties or shortness of breath, dizziness, ankle swelling, uncontrolled high or unusually low blood pressure, and/or palpitations. If you are    experiencing any of these symptoms, cancel your procedure and contact your primary care physician for an evaluation.  Remember:  Regular Business hours are:  Monday to Thursday 8:00 AM to 4:00 PM  Provider's Schedule: Alys Dulak, MD:  Procedure days: Tuesday and Thursday 7:30 AM to 4:00 PM  Bilal Lateef, MD:  Procedure days: Monday and Wednesday 7:30 AM to 4:00 PM ______________________________________________________________________  ____________________________________________________________________________________________  General Risks and Possible Complications  Patient Responsibilities: It is important that you read this as it is part of your informed consent. It is our duty to inform you of the risks and possible complications associated with treatments offered to you. It is your responsibility as a patient to read this and to ask questions about anything that is not clear or that you believe was not covered in this document.  Patient's Rights: You have the right to refuse treatment. You also have the right to change your mind, even after initially having agreed to have the treatment done. However, under this last option, if you wait until the last second to change your mind, you may be charged for the materials used up to that point.  Introduction: Medicine is not an exact science. Everything in Medicine, including the lack of treatment(s), carries the potential for danger, harm, or loss (which is by definition: Risk). In Medicine, a complication is a secondary problem, condition, or disease that can aggravate an already existing one. All treatments carry the risk of possible complications. The fact that a side effects or complications occurs, does not imply that the treatment was conducted incorrectly. It must be clearly understood that these can happen even when everything is done following the highest safety standards.  No treatment: You can choose not to proceed with the  proposed treatment alternative. The "PRO(s)" would include: avoiding the risk of complications associated with the therapy. The "CON(s)" would include: not getting any of the treatment benefits. These benefits fall under one of three categories: diagnostic; therapeutic; and/or palliative. Diagnostic benefits include: getting information which can ultimately lead to improvement of the disease or symptom(s). Therapeutic benefits are those associated with the successful treatment of the disease. Finally, palliative benefits are those related to the decrease of the primary symptoms, without necessarily curing the condition (example: decreasing the pain from a flare-up of a chronic condition, such as incurable terminal cancer).  General Risks and Complications: These are associated to most interventional treatments. They can occur alone, or in combination. They fall under one of the following six (6) categories: no benefit or worsening of symptoms; bleeding; infection; nerve damage; allergic reactions; and/or death. No benefits or worsening of symptoms: In Medicine there are no guarantees, only probabilities. No healthcare provider can ever guarantee that a medical treatment will work, they can only state the probability that it may. Furthermore, there is always the possibility that the condition may worsen, either directly, or indirectly, as a consequence of the treatment. Bleeding: This is more common if the patient is taking a blood thinner, either prescription or over the counter (example: Goody Powders, Fish oil, Aspirin, Garlic, etc.), or if suffering a condition associated with impaired coagulation (example: Hemophilia, cirrhosis of the liver, low platelet counts, etc.). However, even if you do not have one on these, it can still happen. If you have any of these conditions, or take one of these drugs, make sure to notify your treating physician. Infection: This is more common in patients with a compromised  immune system, either due to disease (example:   diabetes, cancer, human immunodeficiency virus [HIV], etc.), or due to medications or treatments (example: therapies used to treat cancer and rheumatological diseases). However, even if you do not have one on these, it can still happen. If you have any of these conditions, or take one of these drugs, make sure to notify your treating physician. Nerve Damage: This is more common when the treatment is an invasive one, but it can also happen with the use of medications, such as those used in the treatment of cancer. The damage can occur to small secondary nerves, or to large primary ones, such as those in the spinal cord and brain. This damage may be temporary or permanent and it may lead to impairments that can range from temporary numbness to permanent paralysis and/or brain death. Allergic Reactions: Any time a substance or material comes in contact with our body, there is the possibility of an allergic reaction. These can range from a mild skin rash (contact dermatitis) to a severe systemic reaction (anaphylactic reaction), which can result in death. Death: In general, any medical intervention can result in death, most of the time due to an unforeseen complication. ____________________________________________________________________________________________  

## 2021-07-23 ENCOUNTER — Telehealth: Payer: Self-pay | Admitting: Acute Care

## 2021-07-23 NOTE — Telephone Encounter (Signed)
Left VM message with contact info to call to schedule annual LDCT

## 2021-07-23 NOTE — Progress Notes (Unsigned)
Name: Nichole Wells   MRN: [id number]    DOB: 1960-06-14   Date:07/24/2021       Progress Note  Subjective  Chief Complaint  Follow Up  HPI  Emphysema with COPD flare :  CT scan chest reviewed. She has some sob, daily cough also has wheezing, she was on Trelegy but not covered by insurance. I gave her Verl Blalock but also very expensive, we will try Trelegy again with a voucher.  She has a history of smoking 1 pack daily for over 30 years. She is smoking only 6 cigarettes daily and trying to quit, she will schedule the repeat CT lung screen    Inflammatory arthritis: she missed follow up with Dr. Jefm Bryant , she has been off Methotrexate for over 2 years, he has stiffness and pain on both hands, also on her back.  . She still has Raynaud's . Advised her to go back to Rheumatologist    HTN: taking medication and bp was at goal until a couple of months ago. She states bp has been in the 160's/170's consistently, having daily headaches. Taking norvasc 10 mg, hctz 12.5 and metoprolol, filling jittery but no palpitation of chest pain .   Hyperlipidemia/Aorta atherosclerosis:Atherosclerosis found on CT chest. Last LDL was 121 but she was not taking it consistently but she has bene compliant since last lab drawn    Major Depression: she was diagnosed in her 70's, she has been taking Prozac for many years, tried other medication in the past. Initially seen by psychiatrist and had therapy in the past . Still in remission and doing well. She does not want to stop medication because she had severe episode that required hospitalization in her 60's for suicide thoughts. Unchanged    Hyperglycemia: she denies polyphagia, polydipsia or polyuria. last A1C was normal at 5.5 % , it was as high as 5.8 % in the past.     Low back pain with sciatica: She has a history of herniated disc and DDD, never had surgery but had injections in the past. She is under the pain clinic now .    Patient Active Problem List    Diagnosis Date Noted   Essential hypertension 07/24/2021   Vitamin B12 deficiency 07/24/2021   Abnormal MRI, lumbar spine (06/03/2014) 06/13/2021   Low back pain of over 3 months duration 05/16/2021   Spondylosis without myelopathy or radiculopathy, lumbosacral region 05/16/2021   DDD (degenerative disc disease), cervical (Multilevel) 01/14/2021   Cervical facet arthropathy (Multilevel) (Bilateral) 01/14/2021   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 01/14/2021   Retrolisthesis of lumbar spine at L1/L2 and L2/L3 01/14/2021   Lumbosacral facet arthropathy (Multilevel) (Bilateral) 01/14/2021   Osteoarthritis of hips (Bilateral) 01/14/2021   Osteoarthritis of wrist (Right) 01/14/2021   Osteoarthritis of first carpometacarpal joint of hand (Right) 01/14/2021   Chronic pain syndrome 11/14/2020   Disorder of skeletal system 11/14/2020   Problems influencing health status 11/14/2020   Chronic left-sided low back pain without sciatica 11/14/2020   Lumbosacral facet syndrome (Bilateral) 11/14/2020   Chronic wrist pain (2ry area of Pain) (Right) 11/14/2020   History of carpal tunnel surgery of wrist (Right) 11/14/2020   Chronic neck pain (3ry area of Pain) (Bilateral) (L>R) 11/14/2020   Cervicalgia 11/14/2020   Cervical facet syndrome 11/14/2020   Chronic hip pain (4th area of Pain) (Bilateral) 11/14/2020   Recurrent major depressive disorder, in remission ([place]) 02/18/2018   Thoracic aorta atherosclerosis ([place]) 11/10/2017   Centrilobular emphysema ([place]) 11/10/2017  Abnormal liver CT 11/10/2017   Seronegative inflammatory arthritis 05/07/2017   Tobacco use 05/07/2017   Vitamin D deficiency 05/28/2016   Surgical menopause on hormone replacement therapy 04/23/2016   Status post laparoscopic assisted vaginal hysterectomy (LAVH) 01/28/2016   Family history of ovarian cancer 11/13/2015   Insomnia 03/27/2015   Uncontrolled hypertension 08/30/2014   Anxiety and depression 08/30/2014    Cardiac murmur 08/30/2014   HLD (hyperlipidemia) 08/30/2014   DDD (degenerative disc disease), lumbar 07/26/2014    Past Surgical History:  Procedure Laterality Date   BREAST BIOPSY Left    stereo- neg   BREAST CYST ASPIRATION Right    neg   BREAST SURGERY     benign biopsy   CHOLECYSTECTOMY     COLONOSCOPY WITH PROPOFOL N/A 10/11/2019   Procedure: COLONOSCOPY WITH PROPOFOL;  Surgeon: [name], MD;  Location: [facility] ENDOSCOPY;  Service: Gastroenterology;  Laterality: N/A;   COLONOSCOPY WITH PROPOFOL N/A 06/26/2021   Procedure: COLONOSCOPY WITH PROPOFOL;  Surgeon: [name], MD;  Location: [facility] ENDOSCOPY;  Service: Gastroenterology;  Laterality: N/A;   DILATION AND CURETTAGE OF UTERUS     LAPAROSCOPIC LYSIS OF ADHESIONS  12/10/2015   Procedure: LAPAROSCOPIC LYSIS OF ADHESIONS;  Surgeon: Brayton Mars, MD;  Location: [facility] ORS;  Service: Gynecology;;   LAPAROSCOPIC VAGINAL HYSTERECTOMY WITH SALPINGO OOPHORECTOMY Bilateral 01/28/2016   Procedure: LAPAROSCOPIC ASSISTED VAGINAL HYSTERECTOMY WITH SALPINGO OOPHORECTOMY;  Surgeon: Brayton Mars, MD;  Location: [facility] ORS;  Service: Gynecology;  Laterality: Bilateral;   LAPAROSCOPY N/A 12/10/2015   Procedure: LAPAROSCOPY DIAGNOSTIC WITH BIOPSIES;  Surgeon: Brayton Mars, MD;  Location: [facility] ORS;  Service: Gynecology;  Laterality: N/A;   SHOULDER ARTHROSCOPY Right    TUBAL LIGATION     UTERINE FIBROID EMBOLIZATION      Family History  Problem Relation Age of Onset   Cancer Mother        cervical, lung   Lung cancer Mother    Heart disease Father    Hypertension Father    COPD Father    Breast cancer Maternal Aunt    Ovarian cancer Maternal 56    Spina bifida Sister    Diabetes Son    Diabetes Maternal Grandmother    Heart disease Maternal Grandmother    Parkinson's disease Paternal Grandfather     Social History   Tobacco Use   Smoking status: Every Day    Packs/day: 1.00    Years: 37.00    Total  pack years: 37.00    Types: Cigarettes    Start date: 10/06/1987   Smokeless tobacco: Never  Substance Use Topics   Alcohol use: Yes    Alcohol/week: 0.0 standard drinks of alcohol    Comment: none last 24hr     Current Outpatient Medications:    albuterol (VENTOLIN HFA) 108 (90 Base) MCG/ACT inhaler, TAKE 2 PUFFS BY MOUTH EVERY 6 HOURS AS NEEDED FOR WHEEZE OR SHORTNESS OF BREATH, Disp: 8.5 each, Rfl: 1   aspirin 81 MG EC tablet, TAKE 1 TABLET BY MOUTH EVERY DAY, Disp: 30 tablet, Rfl: 5   cetirizine (ZYRTEC) 10 MG tablet, Take 10 mg by mouth daily., Disp: , Rfl:    Cyanocobalamin (VITAMIN B-12) 500 MCG SUBL, Place 1 tablet (500 mcg total) under the tongue daily at 12 noon., Disp: 100 tablet, Rfl: 1   fluticasone (FLONASE) 50 MCG/ACT nasal spray, SPRAY 2 SPRAYS INTO EACH NOSTRIL EVERY DAY, Disp: 48 mL, Rfl: 2   Fluticasone-Umeclidin-Vilant (TRELEGY ELLIPTA) 100-62.5-25 MCG/ACT AEPB, Inhale 1  puff into the lungs daily., Disp: 1 each, Rfl: 5   hydrochlorothiazide (HYDRODIURIL) 12.5 MG tablet, Take 1 tablet (12.5 mg total) by mouth daily., Disp: 90 tablet, Rfl: 1   Olmesartan-amLODIPine-HCTZ 40-10-12.5 MG TABS, Take 1 tablet by mouth daily., Disp: 30 tablet, Rfl: 0   Vitamin D, Cholecalciferol, 50 MCG (2000 UT) CAPS, Take by mouth daily., Disp: , Rfl:    FLUoxetine (PROZAC) 40 MG capsule, TAKE 1 CAPSULE BY MOUTH EVERY DAY, Disp: 90 capsule, Rfl: 1   gabapentin (NEURONTIN) 100 MG capsule, Take 1-2 capsules (100-200 mg total) by mouth at bedtime., Disp: 180 capsule, Rfl: 1   metoprolol succinate (TOPROL-XL) 50 MG 24 hr tablet, Take 1 tablet (50 mg total) by mouth daily. Take with or immediately following a meal., Disp: 90 tablet, Rfl: 1   rosuvastatin (CRESTOR) 40 MG tablet, Take 1 tablet (40 mg total) by mouth daily., Disp: 90 tablet, Rfl: 1  Allergies  Allergen Reactions   Hydroxychloroquine Itching    Pt take zyrtec to alleviate symptoms    Penicillin G Hives    Has patient had a PCN  reaction causing immediate rash, facial/tongue/throat swelling, SOB or lightheadedness with hypotension:unsure Has patient had a PCN reaction causing severe rash involving mucus membranes or skin necrosis:unsure Has patient had a PCN reaction that required hospitalization:unsure Has patient had a PCN reaction occurring within the last 10 years:No If all of the above answers are "NO", then may proceed with Cephalosporin use.     I personally reviewed active problem list, medication list, allergies, family history, social history, health maintenance with the patient/caregiver today.   ROS  Constitutional: Negative for fever or weight change.  Respiratory: positive for cough and shortness of breath.   Cardiovascular: Negative for chest pain or palpitations.  Gastrointestinal: Negative for abdominal pain, no bowel changes.  Musculoskeletal: Negative for gait problem or joint swelling.  Skin: Negative for rash.  Neurological: Negative for dizziness , positive for headache.  No other specific complaints in a complete review of systems (except as listed in HPI above).   Objective  Vitals:   07/24/21 0801 07/24/21 0808  BP: (!) 162/84 (!) 160/84  Pulse: 76   Resp: 16   SpO2: 98%   Weight: 174 lb (78.9 kg)   Height: '5\' 2"'  (1.575 m)     Body mass index is 31.83 kg/m.  Physical Exam  Constitutional: Patient appears well-developed and well-nourished. Obese  No distress.  HEENT: head atraumatic, normocephalic, pupils equal and reactive to light, neck supple Cardiovascular: Normal rate, regular rhythm and normal heart sounds.  No murmur heard. No BLE edema. Pulmonary/Chest: Effort normal and breath sounds normal. No respiratory distress. Abdominal: Soft.  There is no tenderness. Psychiatric: Patient has a normal mood and affect. behavior is normal. Judgment and thought content normal.   Recent Results (from the past 2160 hour(s))  CBC with Differential     Status: Abnormal    Collection Time: 06/12/21 11:22 AM  Result Value Ref Range   WBC 8.0 3.8 - 10.8 Thousand/uL   RBC 5.08 3.80 - 5.10 Million/uL   Hemoglobin 15.8 (H) 11.7 - 15.5 g/dL   HCT 46.4 (H) 35.0 - 45.0 %   MCV 91.3 80.0 - 100.0 fL   MCH 31.1 27.0 - 33.0 pg   MCHC 34.1 32.0 - 36.0 g/dL   RDW 12.8 11.0 - 15.0 %   Platelets 334 140 - 400 Thousand/uL   MPV 10.3 7.5 - 12.5 fL   Neutro Abs  5,256 1,500 - 7,800 cells/uL   Lymphs Abs 1,944 850 - 3,900 cells/uL   Absolute Monocytes 600 200 - 950 cells/uL   Eosinophils Absolute 152 15 - 500 cells/uL   Basophils Absolute 48 0 - 200 cells/uL   Neutrophils Relative % 65.7 %   Total Lymphocyte 24.3 %   Monocytes Relative 7.5 %   Eosinophils Relative 1.9 %   Basophils Relative 0.6 %  COMPLETE METABOLIC PANEL WITH GFR     Status: Abnormal   Collection Time: 06/12/21 11:22 AM  Result Value Ref Range   Glucose, Bld 94 65 - 99 mg/dL    Comment: .            Fasting reference interval .    BUN 14 7 - 25 mg/dL   Creat 0.61 0.50 - 1.03 mg/dL   eGFR 104 > OR = 60 mL/min/1.51m    Comment: The eGFR is based on the CKD-EPI 2021 equation. To calculate  the new eGFR from a previous Creatinine or Cystatin C result, go to https://www.kidney.org/professionals/ kdoqi/gfr%5Fcalculator    BUN/Creatinine Ratio NOT APPLICABLE 6 - 22 (calc)   Sodium 137 135 - 146 mmol/L   Potassium 4.8 3.5 - 5.3 mmol/L   Chloride 103 98 - 110 mmol/L   CO2 27 20 - 32 mmol/L   Calcium 10.0 8.6 - 10.4 mg/dL   Total Protein 6.8 6.1 - 8.1 g/dL   Albumin 4.2 3.6 - 5.1 g/dL   Globulin 2.6 1.9 - 3.7 g/dL (calc)   AG Ratio 1.6 1.0 - 2.5 (calc)   Total Bilirubin 0.6 0.2 - 1.2 mg/dL   Alkaline phosphatase (APISO) 88 37 - 153 U/L   AST 27 10 - 35 U/L   ALT 30 (H) 6 - 29 U/L  B12 and Folate Panel     Status: Abnormal   Collection Time: 06/12/21 11:22 AM  Result Value Ref Range   Vitamin B-12 >2,000 (H) 200 - 1,100 pg/mL   Folate 10.7 ng/mL    Comment:                             Reference Range                            Low:           <3.4                            Borderline:    3.4-5.4                            Normal:        >5.4 .   Lipid panel     Status: Abnormal   Collection Time: 06/12/21 11:22 AM  Result Value Ref Range   Cholesterol 199 <200 mg/dL   HDL 52 > OR = 50 mg/dL   Triglycerides 147 <150 mg/dL   LDL Cholesterol (Calc) 121 (H) mg/dL (calc)    Comment: Reference range: <100 . Desirable range <100 mg/dL for primary prevention;   <70 mg/dL for patients with CHD or diabetic patients  with > or = 2 CHD risk factors. .Marland KitchenLDL-C is now calculated using the Martin-Hopkins  calculation, which is a validated novel method providing  better accuracy than the Friedewald equation in the  estimation of  LDL-C.  Cresenciano Genre et al. Annamaria Helling. 5366;440(34): 2061-2068  (http://education.[facility].com/faq/FAQ164)    Total CHOL/HDL Ratio 3.8 <5.0 (calc)   Non-HDL Cholesterol (Calc) 147 (H) <130 mg/dL (calc)    Comment: For patients with diabetes plus 1 major ASCVD risk  factor, treating to a non-HDL-C goal of <100 mg/dL  (LDL-C of <70 mg/dL) is considered a therapeutic  option.   TEST AUTHORIZATION     Status: None   Collection Time: 06/12/21 11:22 AM  Result Value Ref Range   TEST NAME: LIPID    TEST CODE: 7,600    CLIENT CONTACT: DR. Ancil Boozer    REPORT ALWAYS MESSAGE SIGNATURE      Comment: . The laboratory testing on this patient was verbally requested or confirmed by the ordering physician or his or her authorized representative after contact with an employee of [employer]. Federal regulations require that we maintain on file written authorization for all laboratory testing.  Accordingly we are asking that the ordering physician or his or her authorized representative sign a copy of this report and promptly return it to the client service representative. . . Signature:____________________________________________________ .   Surgical  pathology     Status: None   Collection Time: 06/26/21  9:43 AM  Result Value Ref Range   SURGICAL PATHOLOGY      SURGICAL PATHOLOGY CASE: [id number] PATIENT: Natiya Bosshart Surgical Pathology Report     Specimen Submitted: A. Rectum polyp x3; cold snare B. Colon polyp, descending; cold snare  Clinical History: History of colonic polyps Z86.010.  Polyps; diverticulosis    DIAGNOSIS: A. RECTAL POLYPS X3; COLD SNARE: - FRAGMENTS (X3) OF HYPERPLASTIC POLYPS. - NEGATIVE FOR DYSPLASIA AND MALIGNANCY.  B. COLON POLYP, DESCENDING; COLD SNARE: - TUBULAR ADENOMA. - NEGATIVE FOR HIGH-GRADE DYSPLASIA AND MALIGNANCY.  GROSS DESCRIPTION: A. Labeled: Cold snare rectum polyp x3 Received: Formalin Collection time: 9:43 AM on 06/26/2021 Placed into formalin time: 9:43 AM on 06/26/2021 Tissue fragment(s): Multiple Size: Aggregate, 1.2 x 0.3 x 0.2 cm Description: Received are fragments of tan-pink soft tissue admixed with intestinal debris.  The ratio of soft tissue to intestinal debris is 80: 20. Entirely submitted in 1 cassette.  B. Labeled: Cold snare descending colon polyp  Received: Formalin Collection time: 9:56 AM on 06/26/2021 Placed into formalin time: 9:56 AM on 06/26/2021 Tissue fragment(s): Multiple Size: Aggregate, 0.9 x 0.4 x 0.2 cm Description: Received are at least 2 fragments of tan soft tissue admixed with intestinal debris.  The ratio of soft tissue to intestinal debris is 80: 20. Entirely submitted in 1 cassette.  RB 06/26/2021  Final Diagnosis performed by Allena Napoleon, MD.   Electronically signed 06/27/2021 9:09:13AM The electronic signature indicates that the named Attending Pathologist has evaluated the specimen Technical component performed at [facility], [address] [place], [place] [address] Lab: [phone] Dir: Rush Farmer, MD, MMM  Professional component performed at [facility], [facility], [place], [place], [place]  [address] Lab: [phone] Dir: Kathi Simpers, MD     PHQ2/9:    07/24/2021    8:02 AM 06/12/2021   10:46 AM 05/06/2021    2:17 PM 01/23/2021    9:06 AM 12/26/2020    8:25 AM  Depression screen PHQ 2/9  Decreased Interest 0 0 0 0 0  Down, Depressed, Hopeless 0 0 0 0 0  PHQ - 2 Score 0 0 0 0 0  Altered sleeping 0 0  0 0  Tired, decreased energy 0 0  0 0  Change in appetite 0 0  0 0  Feeling bad or failure about yourself  0 0  0 0  Trouble concentrating 0 0  0 0  Moving slowly or fidgety/restless 0 0  0 0  Suicidal thoughts 0 0  0 0  PHQ-9 Score 0 0  0 0  Difficult doing work/chores  Not difficult at all  Not difficult at all     phq 9 is negative   Fall Risk:    07/24/2021    8:01 AM 06/12/2021   10:49 AM 05/06/2021    2:17 PM 01/23/2021    9:06 AM 12/26/2020    8:24 AM  Fall Risk   Falls in the past year? 0 1 0 0 0  Number falls in past yr: 0 0  0 0  Injury with Fall? 0 0  0 0  Risk for fall due to : No Fall Risks History of fall(s)   No Fall Risks  Follow up Falls prevention discussed Falls evaluation completed;Education provided;Falls prevention discussed  Falls evaluation completed Falls prevention discussed      Functional Status Survey: Is the patient deaf or have difficulty hearing?: No Does the patient have difficulty seeing, even when wearing glasses/contacts?: No Does the patient have difficulty concentrating, remembering, or making decisions?: No Does the patient have difficulty walking or climbing stairs?: Yes Does the patient have difficulty dressing or bathing?: No Does the patient have difficulty doing errands alone such as visiting a doctor's office or shopping?: No    Assessment & Plan  Problem List Items Addressed This Visit     DDD (degenerative disc disease), lumbar (Chronic)   Relevant Medications   gabapentin (NEURONTIN) 100 MG capsule   Uncontrolled hypertension   Relevant Medications   metoprolol succinate (TOPROL-XL) 50 MG 24 hr tablet    rosuvastatin (CRESTOR) 40 MG tablet   Olmesartan-amLODIPine-HCTZ 40-10-12.5 MG TABS   Other Relevant Orders   TSH + free T4   Urine Microalbumin w/creat. ratio   Vitamin D deficiency    Continue supplementation       Relevant Medications   Fluticasone-Umeclidin-Vilant (TRELEGY ELLIPTA) 100-62.5-25 MCG/ACT AEPB   Seronegative inflammatory arthritis   Tobacco use    We gave her wellbutrin but advised her not to start medication due to uncontrolled HTN, we will adjust medication for bp and resume wellbutrin when bp is at goal       Thoracic aorta atherosclerosis (HCC) - Primary    Taking statin therapy       Relevant Medications   metoprolol succinate (TOPROL-XL) 50 MG 24 hr tablet   rosuvastatin (CRESTOR) 40 MG tablet   Olmesartan-amLODIPine-HCTZ 40-10-12.5 MG TABS   Centrilobular emphysema (HCC)    We gave her Brezti but not covered, we will try Trelegy with a voucher       Relevant Medications   Fluticasone-Umeclidin-Vilant (TRELEGY ELLIPTA) 100-62.5-25 MCG/ACT AEPB   Recurrent major depressive disorder, in remission ([place])    Doing well on medication      Relevant Medications   FLUoxetine (PROZAC) 40 MG capsule   Chronic left-sided low back pain without sciatica   Relevant Medications   FLUoxetine (PROZAC) 40 MG capsule   gabapentin (NEURONTIN) 100 MG capsule   Essential hypertension    BP has been elevated for the past couple of months, we will stop norvasc and hctz, continue metoprolol and add Tribenzor 10/40/12.5 She will take half twice daily for the first few days and after that one in am  and return in 1 month for bp recheck       Relevant Medications   metoprolol succinate (TOPROL-XL) 50 MG 24 hr tablet   rosuvastatin (CRESTOR) 40 MG tablet   Olmesartan-amLODIPine-HCTZ 40-10-12.5 MG TABS   Vitamin B12 deficiency    Reduce supplementation, last level was high       HLD (hyperlipidemia)   Relevant Medications   metoprolol succinate (TOPROL-XL) 50 MG 24 hr  tablet   rosuvastatin (CRESTOR) 40 MG tablet   Olmesartan-amLODIPine-HCTZ 40-10-12.5 MG TABS   Other Visit Diagnoses     Jittery feeling       Relevant Orders   TSH + free T4

## 2021-07-24 ENCOUNTER — Encounter: Payer: Self-pay | Admitting: Family Medicine

## 2021-07-24 ENCOUNTER — Ambulatory Visit: Payer: No Typology Code available for payment source | Admitting: Family Medicine

## 2021-07-24 VITALS — BP 160/84 | HR 76 | Resp 16 | Ht 62.0 in | Wt 174.0 lb

## 2021-07-24 DIAGNOSIS — M1380 Other specified arthritis, unspecified site: Secondary | ICD-10-CM

## 2021-07-24 DIAGNOSIS — M51369 Other intervertebral disc degeneration, lumbar region without mention of lumbar back pain or lower extremity pain: Secondary | ICD-10-CM

## 2021-07-24 DIAGNOSIS — F3340 Major depressive disorder, recurrent, in remission, unspecified: Secondary | ICD-10-CM

## 2021-07-24 DIAGNOSIS — J432 Centrilobular emphysema: Secondary | ICD-10-CM

## 2021-07-24 DIAGNOSIS — R450 Nervousness: Secondary | ICD-10-CM

## 2021-07-24 DIAGNOSIS — E559 Vitamin D deficiency, unspecified: Secondary | ICD-10-CM

## 2021-07-24 DIAGNOSIS — M545 Low back pain, unspecified: Secondary | ICD-10-CM

## 2021-07-24 DIAGNOSIS — I700 Atherosclerosis of aorta: Secondary | ICD-10-CM

## 2021-07-24 DIAGNOSIS — G8929 Other chronic pain: Secondary | ICD-10-CM

## 2021-07-24 DIAGNOSIS — E7800 Pure hypercholesterolemia, unspecified: Secondary | ICD-10-CM

## 2021-07-24 DIAGNOSIS — I10 Essential (primary) hypertension: Secondary | ICD-10-CM

## 2021-07-24 DIAGNOSIS — M5136 Other intervertebral disc degeneration, lumbar region: Secondary | ICD-10-CM

## 2021-07-24 DIAGNOSIS — E538 Deficiency of other specified B group vitamins: Secondary | ICD-10-CM | POA: Insufficient documentation

## 2021-07-24 DIAGNOSIS — Z720 Tobacco use: Secondary | ICD-10-CM

## 2021-07-24 MED ORDER — OLMESARTAN-AMLODIPINE-HCTZ 40-10-12.5 MG PO TABS: 1.0000 | ORAL_TABLET | Freq: Every day | ORAL | 0 refills | Status: DC

## 2021-07-24 MED ORDER — FLUOXETINE HCL 40 MG PO CAPS: ORAL_CAPSULE | ORAL | 1 refills | Status: DC

## 2021-07-24 MED ORDER — METOPROLOL SUCCINATE ER 50 MG PO TB24: 50.0000 mg | ORAL_TABLET | Freq: Every day | ORAL | 1 refills | Status: DC

## 2021-07-24 MED ORDER — TRELEGY ELLIPTA 100-62.5-25 MCG/ACT IN AEPB: 1.0000 | INHALATION_SPRAY | Freq: Every day | RESPIRATORY_TRACT | 5 refills | Status: DC

## 2021-07-24 MED ORDER — ROSUVASTATIN CALCIUM 40 MG PO TABS: 40.0000 mg | ORAL_TABLET | Freq: Every day | ORAL | 1 refills | Status: DC

## 2021-07-24 MED ORDER — GABAPENTIN 100 MG PO CAPS: 100.0000 mg | ORAL_CAPSULE | Freq: Every day | ORAL | 1 refills | Status: DC

## 2021-07-24 NOTE — Assessment & Plan Note (Signed)
Continue supplementation  ?

## 2021-07-24 NOTE — Assessment & Plan Note (Signed)
Reduce supplementation, last level was high

## 2021-07-24 NOTE — Assessment & Plan Note (Signed)
Taking statin therapy

## 2021-07-24 NOTE — Assessment & Plan Note (Signed)
We gave her Verl Blalock but not covered, we will try Trelegy with a voucher

## 2021-07-24 NOTE — Assessment & Plan Note (Addendum)
BP has been elevated for the past couple of months, we will stop norvasc and hctz, continue metoprolol and add Tribenzor 10/40/12.5 She will take half twice daily for the first few days and after that one in am and return in 1 month for bp recheck  Also discussed sleep study if unable to get bp to goal.

## 2021-07-24 NOTE — Assessment & Plan Note (Signed)
Doing well on medication

## 2021-07-24 NOTE — Assessment & Plan Note (Signed)
We gave her wellbutrin but advised her not to start medication due to uncontrolled HTN, we will adjust medication for bp and resume wellbutrin when bp is at goal

## 2021-07-25 ENCOUNTER — Other Ambulatory Visit: Payer: Self-pay | Admitting: Family Medicine

## 2021-07-25 LAB — MICROALBUMIN / CREATININE URINE RATIO
Creatinine, Urine: 75 mg/dL (ref 20–275)
Microalb Creat Ratio: 9 mcg/mg creat (ref <30)
Microalb, Ur: 0.7 mg/dL

## 2021-07-25 LAB — TSH+FREE T4: TSH W/REFLEX TO FT4: 2.33 mIU/L (ref 0.40–4.50)

## 2021-07-30 ENCOUNTER — Telehealth: Payer: Self-pay

## 2021-07-30 NOTE — Telephone Encounter (Signed)
Insurance denied auth for lumbar facets. They are saying not a covered service, even though they approved the first ones. Not sure what to do at this point. Left patient a vm letting her know

## 2021-08-16 ENCOUNTER — Other Ambulatory Visit: Payer: Self-pay | Admitting: Family Medicine

## 2021-08-16 DIAGNOSIS — I10 Essential (primary) hypertension: Secondary | ICD-10-CM

## 2021-08-22 NOTE — Progress Notes (Signed)
Name: Nichole Wells   MRN: [id number]    DOB: 1960/02/15   Date:08/26/2021       Progress Note  Subjective  Chief Complaint  Follow Up  HPI  Emphysema with COPD  : she is unable to afford Trelegy or Brezti, we will try Spiriva  She has a history of smoking 1 pack daily for over 30 years. She is smoking only 6 cigarettes daily and trying to quit, we will place a new referral for lung cancer screening today    Inflammatory arthritis: she missed follow up with Dr. Jefm Bryant , she has been off Methotrexate for over 2 years, he has stiffness and pain on both hands, also on her back.  . She still has Raynaud's . Reminded her again to go back to Rheumatologist    HTN: taking medication and bp was at goal until a couple of months ago. She states bp has been in the 160's/170's consistently, having daily headaches. She is not on Tribenzor but stopped taking Metoprolol - bp improved but she is having palpitations and still has some headaches but not as severe now. We will resume Metoprolol once bp is at goal we will add Wellbutrin to assist with smoke cessation   Hyperlipidemia/Aorta atherosclerosis:Atherosclerosis found on CT chest. Last LDL was 121 but she was not taking it consistently but she has bene compliant since last lab drawn Unchanged    Major Depression: she was diagnosed in her 51's, she has been taking Prozac for many years, tried other medication in the past. Initially seen by psychiatrist and had psychotherapy.  Still in remission and doing well. She does not want to stop medication because she had severe episode that required hospitalization in her 65's for suicide thoughts. Stable.    Hyperglycemia: she denies polyphagia, polydipsia or polyuria. last A1C was normal at 5.5 % , it was as high as 5.8 % in the past.  Continue life style modification       Patient Active Problem List   Diagnosis Date Noted   Essential hypertension 07/24/2021   Vitamin B12 deficiency 07/24/2021   Abnormal  MRI, lumbar spine (06/03/2014) 06/13/2021   Low back pain of over 3 months duration 05/16/2021   Spondylosis without myelopathy or radiculopathy, lumbosacral region 05/16/2021   DDD (degenerative disc disease), cervical (Multilevel) 01/14/2021   Cervical facet arthropathy (Multilevel) (Bilateral) 01/14/2021   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 01/14/2021   Retrolisthesis of lumbar spine at L1/L2 and L2/L3 01/14/2021   Lumbosacral facet arthropathy (Multilevel) (Bilateral) 01/14/2021   Osteoarthritis of hips (Bilateral) 01/14/2021   Osteoarthritis of wrist (Right) 01/14/2021   Osteoarthritis of first carpometacarpal joint of hand (Right) 01/14/2021   Chronic pain syndrome 11/14/2020   Disorder of skeletal system 11/14/2020   Problems influencing health status 11/14/2020   Chronic left-sided low back pain without sciatica 11/14/2020   Lumbosacral facet syndrome (Bilateral) 11/14/2020   Chronic wrist pain (2ry area of Pain) (Right) 11/14/2020   History of carpal tunnel surgery of wrist (Right) 11/14/2020   Chronic neck pain (3ry area of Pain) (Bilateral) (L>R) 11/14/2020   Cervicalgia 11/14/2020   Cervical facet syndrome 11/14/2020   Chronic hip pain (4th area of Pain) (Bilateral) 11/14/2020   Recurrent major depressive disorder, in remission ([place]) 02/18/2018   Thoracic aorta atherosclerosis ([place]) 11/10/2017   Centrilobular emphysema ([place]) 11/10/2017   Abnormal liver CT 11/10/2017   Seronegative inflammatory arthritis 05/07/2017   Tobacco use 05/07/2017   Vitamin D deficiency 05/28/2016  Surgical menopause on hormone replacement therapy 04/23/2016   Status post laparoscopic assisted vaginal hysterectomy (LAVH) 01/28/2016   Family history of ovarian cancer 11/13/2015   Insomnia 03/27/2015   Uncontrolled hypertension 08/30/2014   Anxiety and depression 08/30/2014   Cardiac murmur 08/30/2014   HLD (hyperlipidemia) 08/30/2014   DDD (degenerative disc disease), lumbar  07/26/2014    Past Surgical History:  Procedure Laterality Date   BREAST BIOPSY Left    stereo- neg   BREAST CYST ASPIRATION Right    neg   BREAST SURGERY     benign biopsy   CHOLECYSTECTOMY     COLONOSCOPY WITH PROPOFOL N/A 10/11/2019   Procedure: COLONOSCOPY WITH PROPOFOL;  Surgeon: [name], MD;  Location: [facility] ENDOSCOPY;  Service: Gastroenterology;  Laterality: N/A;   COLONOSCOPY WITH PROPOFOL N/A 06/26/2021   Procedure: COLONOSCOPY WITH PROPOFOL;  Surgeon: [name], MD;  Location: [facility] ENDOSCOPY;  Service: Gastroenterology;  Laterality: N/A;   DILATION AND CURETTAGE OF UTERUS     LAPAROSCOPIC LYSIS OF ADHESIONS  12/10/2015   Procedure: LAPAROSCOPIC LYSIS OF ADHESIONS;  Surgeon: Brayton Mars, MD;  Location: [facility] ORS;  Service: Gynecology;;   LAPAROSCOPIC VAGINAL HYSTERECTOMY WITH SALPINGO OOPHORECTOMY Bilateral 01/28/2016   Procedure: LAPAROSCOPIC ASSISTED VAGINAL HYSTERECTOMY WITH SALPINGO OOPHORECTOMY;  Surgeon: Brayton Mars, MD;  Location: [facility] ORS;  Service: Gynecology;  Laterality: Bilateral;   LAPAROSCOPY N/A 12/10/2015   Procedure: LAPAROSCOPY DIAGNOSTIC WITH BIOPSIES;  Surgeon: Brayton Mars, MD;  Location: [facility] ORS;  Service: Gynecology;  Laterality: N/A;   SHOULDER ARTHROSCOPY Right    TUBAL LIGATION     UTERINE FIBROID EMBOLIZATION      Family History  Problem Relation Age of Onset   Cancer Mother        cervical, lung   Lung cancer Mother    Heart disease Father    Hypertension Father    COPD Father    Breast cancer Maternal Aunt    Ovarian cancer Maternal 57    Spina bifida Sister    Diabetes Son    Diabetes Maternal Grandmother    Heart disease Maternal Grandmother    Parkinson's disease Paternal Grandfather     Social History   Tobacco Use   Smoking status: Every Day    Packs/day: 1.00    Years: 37.00    Total pack years: 37.00    Types: Cigarettes    Start date: 10/06/1987   Smokeless tobacco: Never  Substance Use  Topics   Alcohol use: Yes    Alcohol/week: 0.0 standard drinks of alcohol    Comment: none last 24hr     Current Outpatient Medications:    albuterol (VENTOLIN HFA) 108 (90 Base) MCG/ACT inhaler, TAKE 2 PUFFS BY MOUTH EVERY 6 HOURS AS NEEDED FOR WHEEZE OR SHORTNESS OF BREATH, Disp: 8.5 each, Rfl: 1   aspirin 81 MG EC tablet, TAKE 1 TABLET BY MOUTH EVERY DAY, Disp: 30 tablet, Rfl: 5   cetirizine (ZYRTEC) 10 MG tablet, Take 10 mg by mouth daily., Disp: , Rfl:    Cyanocobalamin (VITAMIN B-12) 500 MCG SUBL, Place 1 tablet (500 mcg total) under the tongue daily at 12 noon., Disp: 100 tablet, Rfl: 1   FLUoxetine (PROZAC) 40 MG capsule, TAKE 1 CAPSULE BY MOUTH EVERY DAY, Disp: 90 capsule, Rfl: 1   fluticasone (FLONASE) 50 MCG/ACT nasal spray, SPRAY 2 SPRAYS INTO EACH NOSTRIL EVERY DAY, Disp: 48 mL, Rfl: 2   gabapentin (NEURONTIN) 100 MG capsule, Take 1-2 capsules (100-200 mg total) by  mouth at bedtime., Disp: 180 capsule, Rfl: 1   metoprolol succinate (TOPROL-XL) 50 MG 24 hr tablet, Take 1 tablet (50 mg total) by mouth daily. Take with or immediately following a meal., Disp: 90 tablet, Rfl: 1   Olmesartan-amLODIPine-HCTZ 40-10-12.5 MG TABS, TAKE 1 TABLET BY MOUTH EVERY DAY, Disp: 30 tablet, Rfl: 0   rosuvastatin (CRESTOR) 40 MG tablet, Take 1 tablet (40 mg total) by mouth daily., Disp: 90 tablet, Rfl: 1   Vitamin D, Cholecalciferol, 50 MCG (2000 UT) CAPS, Take by mouth daily., Disp: , Rfl:    Fluticasone-Umeclidin-Vilant (TRELEGY ELLIPTA) 100-62.5-25 MCG/ACT AEPB, Inhale 1 puff into the lungs daily. (Patient not taking: Reported on 08/26/2021), Disp: 1 each, Rfl: 5  Allergies  Allergen Reactions   Hydroxychloroquine Itching    Pt take zyrtec to alleviate symptoms    Penicillin G Hives    Has patient had a PCN reaction causing immediate rash, facial/tongue/throat swelling, SOB or lightheadedness with hypotension:unsure Has patient had a PCN reaction causing severe rash involving mucus membranes or  skin necrosis:unsure Has patient had a PCN reaction that required hospitalization:unsure Has patient had a PCN reaction occurring within the last 10 years:No If all of the above answers are "NO", then may proceed with Cephalosporin use.     I personally reviewed active problem list, medication list, allergies, family history, social history, health maintenance with the patient/caregiver today.   ROS  Constitutional: Negative for fever or weight change.  Respiratory: positive  for cough and shortness of breath.   Cardiovascular: Negative for chest pain or palpitations.  Gastrointestinal: Negative for abdominal pain, no bowel changes.  Musculoskeletal: Negative for gait problem or joint swelling.  Skin: Negative for rash.  Neurological: Negative for dizziness , mild and intermittent  headache.  No other specific complaints in a complete review of systems (except as listed in HPI above).   Objective  Vitals:   08/26/21 0810 08/26/21 0823  BP: (!) 146/84 (!) 142/78  Pulse: 96   Resp: 16   SpO2: 98%   Weight: 176 lb (79.8 kg)   Height: '5\' 1"'$  (1.549 m)     Body mass index is 33.25 kg/m.  Physical Exam  Constitutional: Patient appears well-developed and well-nourished. Obese  No distress.  HEENT: head atraumatic, normocephalic, pupils equal and reactive to light, neck supple Cardiovascular: Normal rate, regular rhythm and normal heart sounds.  No murmur heard. No BLE edema. Pulmonary/Chest: Effort normal and breath sounds normal. No respiratory distress. Abdominal: Soft.  There is no tenderness. Psychiatric: Patient has a normal mood and affect. behavior is normal. Judgment and thought content normal.     PHQ2/9:    08/26/2021    8:10 AM 07/24/2021    8:02 AM 06/12/2021   10:46 AM 05/06/2021    2:17 PM 01/23/2021    9:06 AM  Depression screen PHQ 2/9  Decreased Interest 0 0 0 0 0  Down, Depressed, Hopeless 0 0 0 0 0  PHQ - 2 Score 0 0 0 0 0  Altered sleeping 0 0 0  0   Tired, decreased energy 0 0 0  0  Change in appetite 0 0 0  0  Feeling bad or failure about yourself  0 0 0  0  Trouble concentrating 0 0 0  0  Moving slowly or fidgety/restless 0 0 0  0  Suicidal thoughts 0 0 0  0  PHQ-9 Score 0 0 0  0  Difficult doing work/chores   Not difficult at  all  Not difficult at all    phq 9 is negative   Fall Risk:    08/26/2021    8:09 AM 07/24/2021    8:01 AM 06/12/2021   10:49 AM 05/06/2021    2:17 PM 01/23/2021    9:06 AM  Fall Risk   Falls in the past year? 0 0 1 0 0  Number falls in past yr: 0 0 0  0  Injury with Fall? 0 0 0  0  Risk for fall due to : No Fall Risks No Fall Risks History of fall(s)    Follow up Falls prevention discussed Falls prevention discussed Falls evaluation completed;Education provided;Falls prevention discussed  Falls evaluation completed      Functional Status Survey: Is the patient deaf or have difficulty hearing?: No Does the patient have difficulty seeing, even when wearing glasses/contacts?: No Does the patient have difficulty concentrating, remembering, or making decisions?: No Does the patient have difficulty walking or climbing stairs?: No Does the patient have difficulty dressing or bathing?: No Does the patient have difficulty doing errands alone such as visiting a doctor's office or shopping?: No    Assessment & Plan  1. Centrilobular emphysema (HCC)  - tiotropium (SPIRIVA HANDIHALER) 18 MCG inhalation capsule; Place 1 capsule (18 mcg total) into inhaler and inhale daily.  Dispense: 90 capsule; Refill: 1 - albuterol (VENTOLIN HFA) 108 (90 Base) MCG/ACT inhaler; Inhale 2 puffs into the lungs every 4 (four) hours as needed for wheezing or shortness of breath.  Dispense: 8.5 each; Refill: 1 - Ambulatory Referral Lung Cancer Screening [facility]  2. Essential hypertension  - Olmesartan-amLODIPine-HCTZ 40-10-12.5 MG TABS; Take 1 tablet by mouth daily.  Dispense: 90 tablet; Refill: 1  3. Perennial  allergic rhinitis  - fluticasone (FLONASE) 50 MCG/ACT nasal spray; SPRAY 2 SPRAYS INTO EACH NOSTRIL EVERY DAY  Dispense: 48 mL; Refill: 1  4. Pure hypercholesterolemia   5. Recurrent major depressive disorder, in remission ([place])  Doing well   6. Tobacco use  She wants to take Wellbutrin, she will return to check bp with CMA and if back to normal we will start Wellbutrin

## 2021-08-23 ENCOUNTER — Ambulatory Visit: Payer: Self-pay

## 2021-08-23 NOTE — Telephone Encounter (Signed)
  Chief Complaint: Unsure which meds pt should be taking Symptoms: ibid Frequency: since 07/24/2021 Pertinent Negatives: Patient denies  Disposition: '[]'$ ED /'[]'$ Urgent Care (no appt availability in office) / '[]'$ Appointment(In office/virtual)/ '[]'$  [facility]/ '[]'$ Home Care/ '[]'$ Refused Recommended Disposition /'[]'$  Mobile Bus/ '[x]'$  Follow-up with PCP Additional Notes: Pt is unsure which medications she should be taking for HTN.  Please call and clarify for pt.     Summary: medication clarity   Pt was sent Olmesartan-amLODIPine-HCTZ 40-10-12.5 MG TABS / but she also received metoprolol succinate (TOPROL-XL) 50 MG 24 hr tablet / pt is not sure what medication she should be taking and has some confusion / needs to speak with a nurse      Reason for Disposition  [1] Caller has URGENT medicine question about med that PCP or specialist prescribed AND [2] triager unable to answer question  Answer Assessment - Initial Assessment Questions 1. NAME of MEDICATION: "What medicine are you calling about?"     Olmesartan - amlodipine, metoprolol and Tribenzor 2. QUESTION: "What is your question?" (e.g., double dose of medicine, side effect)     PT unsure which medications she should be taking 3. PRESCRIBING HCP: "Who prescribed it?" Reason: if prescribed by specialist, call should be referred to that group.     Dr. Ancil Boozer 4. SYMPTOMS: "Do you have any symptoms?"     Heart racing 5. SEVERITY: If symptoms are present, ask "Are they mild, moderate or severe?"     mild 6. PREGNANCY:  "Is there any chance that you are pregnant?" "When was your last menstrual period?"  Protocols used: Medication Question Call-A-AH

## 2021-08-26 ENCOUNTER — Ambulatory Visit: Payer: No Typology Code available for payment source | Admitting: Family Medicine

## 2021-08-26 ENCOUNTER — Encounter: Payer: Self-pay | Admitting: Family Medicine

## 2021-08-26 VITALS — BP 142/78 | HR 96 | Resp 16 | Ht 61.0 in | Wt 176.0 lb

## 2021-08-26 DIAGNOSIS — J3089 Other allergic rhinitis: Secondary | ICD-10-CM | POA: Diagnosis not present

## 2021-08-26 DIAGNOSIS — I10 Essential (primary) hypertension: Secondary | ICD-10-CM | POA: Diagnosis not present

## 2021-08-26 DIAGNOSIS — E7800 Pure hypercholesterolemia, unspecified: Secondary | ICD-10-CM | POA: Diagnosis not present

## 2021-08-26 DIAGNOSIS — J432 Centrilobular emphysema: Secondary | ICD-10-CM | POA: Diagnosis not present

## 2021-08-26 DIAGNOSIS — Z720 Tobacco use: Secondary | ICD-10-CM

## 2021-08-26 DIAGNOSIS — F3340 Major depressive disorder, recurrent, in remission, unspecified: Secondary | ICD-10-CM

## 2021-08-26 MED ORDER — FLUTICASONE PROPIONATE 50 MCG/ACT NA SUSP: NASAL | 1 refills | Status: DC

## 2021-08-26 MED ORDER — OLMESARTAN-AMLODIPINE-HCTZ 40-10-12.5 MG PO TABS: 1.0000 | ORAL_TABLET | Freq: Every day | ORAL | 1 refills | Status: DC

## 2021-08-26 MED ORDER — SPIRIVA HANDIHALER 18 MCG IN CAPS: 18.0000 ug | ORAL_CAPSULE | Freq: Every day | RESPIRATORY_TRACT | 1 refills | Status: DC

## 2021-08-26 MED ORDER — ALBUTEROL SULFATE HFA 108 (90 BASE) MCG/ACT IN AERS: 2.0000 | INHALATION_SPRAY | RESPIRATORY_TRACT | 1 refills | Status: DC | PRN

## 2021-09-17 ENCOUNTER — Encounter: Payer: Self-pay | Admitting: Family Medicine

## 2021-09-17 ENCOUNTER — Telehealth: Payer: Self-pay | Admitting: *Deleted

## 2021-09-17 ENCOUNTER — Ambulatory Visit: Payer: No Typology Code available for payment source | Admitting: Family Medicine

## 2021-09-17 VITALS — BP 130/72 | HR 69 | Temp 98.1°F | Resp 18 | Ht <= 58 in | Wt 177.5 lb

## 2021-09-17 DIAGNOSIS — J0140 Acute pansinusitis, unspecified: Secondary | ICD-10-CM

## 2021-09-17 MED ORDER — AZITHROMYCIN 500 MG PO TABS: 500.0000 mg | ORAL_TABLET | Freq: Every day | ORAL | 0 refills | Status: DC

## 2021-09-17 NOTE — Progress Notes (Signed)
Name: Nichole Wells   MRN: [id number]    DOB: 09-12-1960   Date:09/17/2021       Progress Note  Subjective  Chief Complaint  Sinusitis  HPI  She states symptoms started 5 days ago. Initially she had rhinorrhea, the following she developed facial pressure and headache, tried otc Sudafed , cold and flu medication, she has been using flonase. Yesterday symptoms got worse, increase in headache/facial pressure. She has mild post-nasal drainage, no fever but she has chills. Appetite is normal, no nausea, vomiting or diarrhea. No change in taste or sense of smell. She has a history of COPD and states lungs are starting to feel congested   She had negative home COVID-19 test that was negative after 24 hours of symptoms   Patient Active Problem List   Diagnosis Date Noted   Perennial allergic rhinitis 08/26/2021   Essential hypertension 07/24/2021   Vitamin B12 deficiency 07/24/2021   Abnormal MRI, lumbar spine (06/03/2014) 06/13/2021   Low back pain of over 3 months duration 05/16/2021   Spondylosis without myelopathy or radiculopathy, lumbosacral region 05/16/2021   DDD (degenerative disc disease), cervical (Multilevel) 01/14/2021   Cervical facet arthropathy (Multilevel) (Bilateral) 01/14/2021   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 01/14/2021   Retrolisthesis of lumbar spine at L1/L2 and L2/L3 01/14/2021   Lumbosacral facet arthropathy (Multilevel) (Bilateral) 01/14/2021   Osteoarthritis of hips (Bilateral) 01/14/2021   Osteoarthritis of wrist (Right) 01/14/2021   Osteoarthritis of first carpometacarpal joint of hand (Right) 01/14/2021   Chronic pain syndrome 11/14/2020   Disorder of skeletal system 11/14/2020   Problems influencing health status 11/14/2020   Chronic left-sided low back pain without sciatica 11/14/2020   Lumbosacral facet syndrome (Bilateral) 11/14/2020   Chronic wrist pain (2ry area of Pain) (Right) 11/14/2020   History of carpal tunnel surgery of wrist  (Right) 11/14/2020   Chronic neck pain (3ry area of Pain) (Bilateral) (L>R) 11/14/2020   Cervicalgia 11/14/2020   Cervical facet syndrome 11/14/2020   Chronic hip pain (4th area of Pain) (Bilateral) 11/14/2020   Recurrent major depressive disorder, in remission ([place]) 02/18/2018   Thoracic aorta atherosclerosis ([place]) 11/10/2017   Centrilobular emphysema ([place]) 11/10/2017   Abnormal liver CT 11/10/2017   Seronegative inflammatory arthritis 05/07/2017   Tobacco use 05/07/2017   Vitamin D deficiency 05/28/2016   Surgical menopause on hormone replacement therapy 04/23/2016   Status post laparoscopic assisted vaginal hysterectomy (LAVH) 01/28/2016   Family history of ovarian cancer 11/13/2015   Insomnia 03/27/2015   Uncontrolled hypertension 08/30/2014   Anxiety and depression 08/30/2014   Cardiac murmur 08/30/2014   HLD (hyperlipidemia) 08/30/2014   DDD (degenerative disc disease), lumbar 07/26/2014    Past Surgical History:  Procedure Laterality Date   BREAST BIOPSY Left    stereo- neg   BREAST CYST ASPIRATION Right    neg   BREAST SURGERY     benign biopsy   CHOLECYSTECTOMY     COLONOSCOPY WITH PROPOFOL N/A 10/11/2019   Procedure: COLONOSCOPY WITH PROPOFOL;  Surgeon: [name], MD;  Location: [facility] ENDOSCOPY;  Service: Gastroenterology;  Laterality: N/A;   COLONOSCOPY WITH PROPOFOL N/A 06/26/2021   Procedure: COLONOSCOPY WITH PROPOFOL;  Surgeon: [name], MD;  Location: [facility] ENDOSCOPY;  Service: Gastroenterology;  Laterality: N/A;   DILATION AND CURETTAGE OF UTERUS     LAPAROSCOPIC LYSIS OF ADHESIONS  12/10/2015   Procedure: LAPAROSCOPIC LYSIS OF ADHESIONS;  Surgeon: Brayton Mars, MD;  Location: [facility] ORS;  Service: Gynecology;;   LAPAROSCOPIC VAGINAL  HYSTERECTOMY WITH SALPINGO OOPHORECTOMY Bilateral 01/28/2016   Procedure: LAPAROSCOPIC ASSISTED VAGINAL HYSTERECTOMY WITH SALPINGO OOPHORECTOMY;  Surgeon: Brayton Mars, MD;  Location: [facility] ORS;  Service: Gynecology;   Laterality: Bilateral;   LAPAROSCOPY N/A 12/10/2015   Procedure: LAPAROSCOPY DIAGNOSTIC WITH BIOPSIES;  Surgeon: Brayton Mars, MD;  Location: [facility] ORS;  Service: Gynecology;  Laterality: N/A;   SHOULDER ARTHROSCOPY Right    TUBAL LIGATION     UTERINE FIBROID EMBOLIZATION      Family History  Problem Relation Age of Onset   Cancer Mother        cervical, lung   Lung cancer Mother    Heart disease Father    Hypertension Father    COPD Father    Breast cancer Maternal Aunt    Ovarian cancer Maternal 42    Spina bifida Sister    Diabetes Son    Diabetes Maternal Grandmother    Heart disease Maternal Grandmother    Parkinson's disease Paternal Grandfather     Social History   Tobacco Use   Smoking status: Every Day    Packs/day: 1.00    Years: 37.00    Total pack years: 37.00    Types: Cigarettes    Start date: 10/06/1987   Smokeless tobacco: Never  Substance Use Topics   Alcohol use: Yes    Alcohol/week: 0.0 standard drinks of alcohol    Comment: none last 24hr     Current Outpatient Medications:    albuterol (VENTOLIN HFA) 108 (90 Base) MCG/ACT inhaler, Inhale 2 puffs into the lungs every 4 (four) hours as needed for wheezing or shortness of breath., Disp: 8.5 each, Rfl: 1   aspirin 81 MG EC tablet, TAKE 1 TABLET BY MOUTH EVERY DAY, Disp: 30 tablet, Rfl: 5   cetirizine (ZYRTEC) 10 MG tablet, Take 10 mg by mouth daily., Disp: , Rfl:    Cyanocobalamin (VITAMIN B-12) 500 MCG SUBL, Place 1 tablet (500 mcg total) under the tongue daily at 12 noon., Disp: 100 tablet, Rfl: 1   FLUoxetine (PROZAC) 40 MG capsule, TAKE 1 CAPSULE BY MOUTH EVERY DAY, Disp: 90 capsule, Rfl: 1   fluticasone (FLONASE) 50 MCG/ACT nasal spray, SPRAY 2 SPRAYS INTO EACH NOSTRIL EVERY DAY, Disp: 48 mL, Rfl: 1   gabapentin (NEURONTIN) 100 MG capsule, Take 1-2 capsules (100-200 mg total) by mouth at bedtime., Disp: 180 capsule, Rfl: 1   metoprolol succinate (TOPROL-XL) 50 MG 24 hr tablet, Take 1  tablet (50 mg total) by mouth daily. Take with or immediately following a meal., Disp: 90 tablet, Rfl: 1   Olmesartan-amLODIPine-HCTZ 40-10-12.5 MG TABS, Take 1 tablet by mouth daily., Disp: 90 tablet, Rfl: 1   rosuvastatin (CRESTOR) 40 MG tablet, Take 1 tablet (40 mg total) by mouth daily., Disp: 90 tablet, Rfl: 1   tiotropium (SPIRIVA HANDIHALER) 18 MCG inhalation capsule, Place 1 capsule (18 mcg total) into inhaler and inhale daily., Disp: 90 capsule, Rfl: 1   Vitamin D, Cholecalciferol, 50 MCG (2000 UT) CAPS, Take by mouth daily., Disp: , Rfl:   Allergies  Allergen Reactions   Hydroxychloroquine Itching    Pt take zyrtec to alleviate symptoms    Penicillin G Hives    Has patient had a PCN reaction causing immediate rash, facial/tongue/throat swelling, SOB or lightheadedness with hypotension:unsure Has patient had a PCN reaction causing severe rash involving mucus membranes or skin necrosis:unsure Has patient had a PCN reaction that required hospitalization:unsure Has patient had a PCN reaction occurring within the last 10  years:No If all of the above answers are "NO", then may proceed with Cephalosporin use.     I personally reviewed active problem list, medication list, allergies, family history, social history, health maintenance with the patient/caregiver today.   ROS  Ten systems reviewed and is negative except as mentioned in HPI   Objective  Vitals:   09/17/21 1340  BP: 130/72  Pulse: 69  Resp: 18  Temp: 98.1 F (36.7 C)  TempSrc: Oral  SpO2: 97%  Weight: 177 lb 8 oz (80.5 kg)  Height: '4\' 4"'$  (1.321 m)    Body mass index is 46.15 kg/m.  Physical Exam  Constitutional: Patient appears well-developed and well-nourished. Obese  No distress. HEENT: head atraumatic, normocephalic, pupils equal and reactive to light, ears normal TM bilaterally , neck supple, throat within normal limits , tender during percussion of facial sinus but worse on frontal sinus   Cardiovascular: Normal rate, regular rhythm and normal heart sounds.  No murmur heard. No BLE edema. Pulmonary/Chest: Effort normal and breath sounds normal. No respiratory distress. Abdominal: Soft.  There is no tenderness. Psychiatric: Patient has a normal mood and affect. behavior is normal. Judgment and thought content normal.   PHQ2/9:    09/17/2021    1:48 PM 08/26/2021    8:10 AM 07/24/2021    8:02 AM 06/12/2021   10:46 AM 05/06/2021    2:17 PM  Depression screen PHQ 2/9  Decreased Interest 0 0 0 0 0  Down, Depressed, Hopeless 0 0 0 0 0  PHQ - 2 Score 0 0 0 0 0  Altered sleeping 0 0 0 0   Tired, decreased energy 2 0 0 0   Change in appetite 0 0 0 0   Feeling bad or failure about yourself  0 0 0 0   Trouble concentrating 0 0 0 0   Moving slowly or fidgety/restless 0 0 0 0   Suicidal thoughts 0 0 0 0   PHQ-9 Score 2 0 0 0   Difficult doing work/chores Not difficult at all   Not difficult at all     phq 9 is negative   Fall Risk:    09/17/2021    1:48 PM 08/26/2021    8:09 AM 07/24/2021    8:01 AM 06/12/2021   10:49 AM 05/06/2021    2:17 PM  Fall Risk   Falls in the past year? 0 0 0 1 0  Number falls in past yr:  0 0 0   Injury with Fall?  0 0 0   Risk for fall due to : No Fall Risks No Fall Risks No Fall Risks History of fall(s)   Follow up Falls prevention discussed Falls prevention discussed Falls prevention discussed Falls evaluation completed;Education provided;Falls prevention discussed       Functional Status Survey: Is the patient deaf or have difficulty hearing?: No Does the patient have difficulty seeing, even when wearing glasses/contacts?: No Does the patient have difficulty concentrating, remembering, or making decisions?: No Does the patient have difficulty walking or climbing stairs?: No Does the patient have difficulty dressing or bathing?: No Does the patient have difficulty doing errands alone such as visiting a doctor's office or shopping?:  No    Assessment & Plan  1. Acute non-recurrent pansinusitis  - azithromycin (ZITHROMAX) 500 MG tablet; Take 1 tablet (500 mg total) by mouth daily.  Dispense: 3 tablet; Refill: 0   Advised to continue otc medication, rest, increase fluid intake, use nasal saline every  2 hours and call back if no improvement

## 2021-09-17 NOTE — Telephone Encounter (Signed)
Left message to call to schedule follow up [facility] CT Scan.

## 2021-09-25 ENCOUNTER — Encounter: Payer: Self-pay | Admitting: Pain Medicine

## 2021-09-26 ENCOUNTER — Emergency Department: Payer: No Typology Code available for payment source

## 2021-09-26 ENCOUNTER — Emergency Department
Admission: EM | Admit: 2021-09-26 | Discharge: 2021-09-26 | Disposition: A | Discharge status: Home / Self Care | Payer: No Typology Code available for payment source | Attending: Emergency Medicine | Admitting: Emergency Medicine

## 2021-09-26 ENCOUNTER — Encounter: Payer: Self-pay | Admitting: Emergency Medicine

## 2021-09-26 ENCOUNTER — Ambulatory Visit (INDEPENDENT_AMBULATORY_CARE_PROVIDER_SITE_OTHER): Payer: No Typology Code available for payment source | Admitting: Family Medicine

## 2021-09-26 ENCOUNTER — Other Ambulatory Visit: Payer: Self-pay

## 2021-09-26 VITALS — BP 136/74

## 2021-09-26 DIAGNOSIS — I10 Essential (primary) hypertension: Secondary | ICD-10-CM | POA: Insufficient documentation

## 2021-09-26 DIAGNOSIS — R002 Palpitations: Secondary | ICD-10-CM | POA: Diagnosis present

## 2021-09-26 LAB — BASIC METABOLIC PANEL
Anion gap: 7 (ref 5–15)
BUN: 16 mg/dL (ref 6–20)
CO2: 25 mmol/L (ref 22–32)
Calcium: 9.9 mg/dL (ref 8.9–10.3)
Chloride: 108 mmol/L (ref 98–111)
Creatinine, Ser: 0.65 mg/dL (ref 0.44–1.00)
GFR, Estimated: >60 mL/min (ref >60)
Glucose, Bld: 169 mg/dL — ABNORMAL HIGH (ref 70–99)
Potassium: 3.9 mmol/L (ref 3.5–5.1)
Sodium: 140 mmol/L (ref 135–145)

## 2021-09-26 LAB — CBC
HCT: 42.6 % (ref 36.0–46.0)
Hemoglobin: 14.1 g/dL (ref 12.0–15.0)
MCH: 30.5 pg (ref 26.0–34.0)
MCHC: 33.1 g/dL (ref 30.0–36.0)
MCV: 92 fL (ref 80.0–100.0)
Platelets: 335 10*3/uL (ref 150–400)
RBC: 4.63 MIL/uL (ref 3.87–5.11)
RDW: 14.3 % (ref 11.5–15.5)
WBC: 8 10*3/uL (ref 4.0–10.5)
nRBC: 0 % (ref 0.0–0.2)

## 2021-09-26 LAB — TROPONIN I (HIGH SENSITIVITY): Troponin I (High Sensitivity): 3 ng/L (ref <18)

## 2021-09-26 NOTE — ED Provider Notes (Signed)
[facility] Provider Note    Event Date/Time   First MD Initiated Contact with Patient 09/26/21 1417     (approximate)  History   Chief Complaint: Palpitations (Pt. To ED from home for palpitations with some SOB since yesterday. Pt. Was seen by PCP today for blood draw. Pt. Denies CP, n/v/d. Pt. States she has prolapsed valve so has palpitations intermittently but not this long or strong.)  HPI  Nichole Wells is a 61 y.o. female with a past medical history of anxiety, arthritis, gastric reflux, hypertension, presents to the emergency department for palpitations.  According to the patient has a history of palpitations she is prescribed metoprolol for the same.  She states recently her blood pressure has been increasing she saw her doctor who changed her blood pressure medications.  She states since this time she has had increased palpitations.  Patient remains on metoprolol 50 mg once daily.  Patient denies any chest pain but states she is aware of the palpitations.  Physical Exam   Triage Vital Signs: ED Triage Vitals  Enc Vitals Group     BP 09/26/21 1011 136/62     Pulse Rate 09/26/21 1011 82     Resp 09/26/21 1011 18     Temp 09/26/21 1011 98.7 F (37.1 C)     Temp Source 09/26/21 1011 Oral     SpO2 09/26/21 1011 97 %     Weight 09/26/21 1012 177 lb (80.3 kg)     Height 09/26/21 1012 '5\' 2"'$  (1.575 m)     Head Circumference --      Peak Flow --      Pain Score 09/26/21 1012 0     Pain Loc --      Pain Edu? --      Excl. in [place]? --     Most recent vital signs: Vitals:   09/26/21 1011  BP: 136/62  Pulse: 82  Resp: 18  Temp: 98.7 F (37.1 C)  SpO2: 97%    General: Awake, no distress.  CV:  Good peripheral perfusion.  Regular rate and rhythm  Resp:  Normal effort.  Equal breath sounds bilaterally.  Abd:  No distention.  Soft, nontender.  No rebound or guarding.    ED Results / Procedures / Treatments   EKG  EKG viewed and interpreted by  myself shows a normal sinus rhythm at 84 bpm with a narrow QRS, normal axis, normal intervals, no concerning ST changes.  RADIOLOGY  I have reviewed and interpreted the chest x-ray images.  No significant abnormality seen on my evaluation. Radiology has read the chest x-ray is negative.   MEDICATIONS ORDERED IN ED: Medications - No data to display   IMPRESSION / MDM / [place] / ED COURSE  I reviewed the triage vital signs and the nursing notes.  Patient's presentation is most consistent with acute presentation with potential threat to life or bodily function.  Patient presents to the emergency department for increased palpitations since her blood pressure medications were recently adjusted.  Overall the patient appears well, no distress.  Blood pressure is reassuring in the emergency department.  CBC is normal, chemistry is reassuring.  Troponin is negative.  Patient's vitals are reassuring.  On auscultation patient does appear to have an occasional ectopic beat however only 1 every 20 to 30 seconds.  EKG is reassuring, troponin negative.  I believe the patient is safe for discharge home.  We will refer to cardiology as  the patient does have a history of mitral valve prolapse as well as palpitations states she is not currently followed by cardiologist.  I believe consideration for a Holter monitor would be useful.  Patient states she has worn a Holter monitor but it has been greater than 10 years ago.  FINAL CLINICAL IMPRESSION(S) / ED DIAGNOSES   Palpitations    Note:  This document was prepared using Dragon voice recognition software and may include unintentional dictation errors.   Harvest Dark, MD 09/26/21 [phone]

## 2021-09-26 NOTE — Progress Notes (Signed)
Pt was seen a month ago for regular routine follow-up. BP was high was advised to come in a month after to get BP rechecked. Pt sat for 5 mins and her BP reading was 144/78. Rechecked it again after another set of 5 mins 136/74. Pt states has been monitoring at home and systolic number runs between 027O-536U and diastolic number around 44I.

## 2021-09-26 NOTE — Progress Notes (Deleted)
Pt was seen a month ago for regular routine follow-up. BP was high was advised to come in a month after to get BP rechecked. Pt sat for 5 mins and her BP reading was 144/78. Rechecked it again after another set of 5 mins 136/74. Pt states has been monitoring at home and systolic number runs between 110R-159Y and diastolic number around 58P.

## 2021-09-26 NOTE — ED Triage Notes (Signed)
Pt. To ED from home for palpitations with some SOB since yesterday. Pt. Was seen by PCP today for blood draw. Pt. Denies CP, n/v/d. Pt. States she has prolapsed valve so has palpitations intermittently but not this long or strong.

## 2021-10-06 NOTE — Progress Notes (Unsigned)
PROVIDER NOTE: Information contained herein reflects review and annotations entered in association with encounter. Interpretation of such information and data should be left to medically-trained personnel. Information provided to patient can be located elsewhere in the medical record under "Patient Instructions". Document created using STT-dictation technology, any transcriptional errors that may result from process are unintentional.    Patient: Nichole Wells  Service Category: E/M  Provider: Gaspar Cola, MD  DOB: [birth year]  DOS: 10/09/2021  Referring Provider: Steele Sizer, MD  MRN: [id number]  Specialty: Interventional Pain Management  PCP: Steele Sizer, MD  Type: Established Patient  Setting: Ambulatory outpatient    Location: Office  Delivery: Face-to-face     HPI  Ms. Christle Nolting, a 61 y.o. year old female, is here today because of her No primary diagnosis found.. Ms. Yamamoto primary complain today is No chief complaint on file. Last encounter: My last encounter with her was on 07/11/2021. Pertinent problems: Ms. 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 Pain Assessment: Severity of   is reported as a  /10. Location:    / . Onset:  . Quality:  . Timing:  . Modifying factor(s):  Marland Kitchen Vitals:  vitals were not taken for this visit.   Reason for encounter:  *** . ***  Pharmacotherapy Assessment  Analgesic: None MME/day: 0 mg/day   Monitoring: [place] PMP: PDMP reviewed during this encounter.       Pharmacotherapy: No side-effects or adverse reactions reported. Compliance: No problems identified. Effectiveness: Clinically acceptable.  No notes on file  No results found for: "CBDTHCR" No results found for: "D8THCCBX" No results found for: "D9THCCBX"  UDS:  Summary  Date Value Ref Range Status  11/14/2020 Note  Final    Comment:    ==================================================================== Compliance Drug Analysis, Ur ==================================================================== Test                             Result       Flag       Units  Drug Present   Fluoxetine                     PRESENT   Norfluoxetine                  PRESENT    Norfluoxetine is an expected metabolite of fluoxetine.    Naproxen                       PRESENT   Metoprolol                     PRESENT ==================================================================== Test  Result    Flag   Units      Ref Range   Creatinine              63               mg/dL      >=20 ==================================================================== Declared Medications:  Medication list was not provided. ==================================================================== For clinical consultation, please call [phone]. ====================================================================       ROS  Constitutional: Denies any fever or chills Gastrointestinal: No reported hemesis,  hematochezia, vomiting, or acute GI distress Musculoskeletal: Denies any acute onset joint swelling, redness, loss of ROM, or weakness Neurological: No reported episodes of acute onset apraxia, aphasia, dysarthria, agnosia, amnesia, paralysis, loss of coordination, or loss of consciousness  Medication Review  FLUoxetine, Olmesartan-amLODIPine-HCTZ, Vitamin B-12, albuterol, aspirin EC, azithromycin, cetirizine, fluticasone, gabapentin, metoprolol succinate, rosuvastatin, tiotropium, and vitamin D3  History Review  Allergy: Ms. Matin is allergic to hydroxychloroquine and penicillin g. Drug: Ms. Widjaja  reports no history of drug use. Alcohol:  reports current alcohol use. Tobacco:  reports that she has been smoking cigarettes. She started smoking about 34 years ago. She has a 37.00 pack-year smoking history. She has never used smokeless tobacco. Social: Ms. Bringle  reports that she has been smoking cigarettes. She started smoking about 34 years ago. She has a 37.00 pack-year smoking history. She has never used smokeless tobacco. She reports current alcohol use. She reports that she does not use drugs. Medical:  has a past medical history of Anxiety, Arthritis, GERD (gastroesophageal reflux disease), Heart murmur, Hypertension, Inflammatory arthritis, and Vitamin D deficiency. Surgical: Ms. Morris  has a past surgical history that includes Dilation and curettage of uterus; Cholecystectomy; Uterine fibroid embolization; Shoulder arthroscopy (Right); Tubal ligation; Breast surgery; laparoscopy (N/A, 12/10/2015); Laparoscopic lysis of adhesions (12/10/2015); Breast cyst aspiration (Right); Laparoscopic vaginal hysterectomy with salpingo oophorectomy (Bilateral, 01/28/2016); Breast biopsy (Left); Colonoscopy with propofol (N/A, 10/11/2019); and Colonoscopy with propofol (N/A, 06/26/2021). Family: family history includes Breast cancer in her maternal aunt; COPD in her father; Cancer in her mother;  Diabetes in her maternal grandmother and son; Heart disease in her father and maternal grandmother; Hypertension in her father; Lung cancer in her mother; Ovarian cancer in her maternal aunt; Parkinson's disease in her paternal grandfather; Spina bifida in her sister.  Laboratory Chemistry Profile   Renal Lab Results  Component Value Date   BUN 16 09/26/2021   CREATININE 0.65 09/26/2021   LABCREA 75 24/58/0998   BCR NOT APPLICABLE 33/82/5053   GFRAA 124 07/12/2020   GFRNONAA >60 09/26/2021    Hepatic Lab Results  Component Value Date   AST 27 06/12/2021   ALT 30 (H) 06/12/2021   ALBUMIN 4.4 11/14/2020   ALKPHOS 109 11/14/2020   HCVAB NON-REACTIVE 09/23/2016   LIPASE 36 04/29/2015    Electrolytes Lab Results  Component Value Date   NA 140 09/26/2021   K 3.9 09/26/2021   CL 108 09/26/2021   CALCIUM 9.9 09/26/2021   MG 1.9 11/14/2020    Bone Lab Results  Component Value Date   VD25OH 53 07/20/2019   25OHVITD1 47 11/14/2020   25OHVITD2 <1.0 11/14/2020   25OHVITD3 46 11/14/2020    Inflammation (CRP: Acute Phase) (ESR: Chronic Phase) Lab Results  Component Value Date   CRP 2 11/14/2020   ESRSEDRATE 14 11/14/2020         Note: Above Lab results reviewed.  Recent Imaging Review  DG Chest 2 View CLINICAL DATA:  palpitations  EXAM: CHEST - 2 VIEW  COMPARISON:  12/27/2020 no  FINDINGS: The heart size and mediastinal contours are within normal limits. Both lungs are clear. No pleural effusion or pneumothorax. The visualized skeletal structures are unremarkable.  IMPRESSION: No active cardiopulmonary disease.  Electronically Signed   By: Macy Mis M.D.   On: 09/26/2021 10:53 Note: Reviewed        Physical Exam  General appearance: Well nourished, well developed, and well hydrated. In no apparent acute distress Mental status: Alert, oriented x 3 (person, place, & time)       Respiratory: No evidence of acute respiratory distress Eyes: PERLA Vitals:  LMP 12/24/2015 (Approximate)  BMI: Estimated body mass index is 32.37 kg/m as calculated from the following:   Height as of 09/26/21: $RemoveBef'5\' 2"'HlDLloaYJg$  (1.575 m).   Weight as of 09/26/21: 177 lb (80.3 kg). Ideal: Ideal body weight: 50.1 kg (110 lb 7.2 oz) Adjusted ideal body weight: 62.2 kg (137 lb 1.1 oz)  Assessment   Diagnosis Status  No diagnosis found. Controlled Controlled Controlled   Updated Problems: No problems updated.  Plan of Care  Problem-specific:  No problem-specific Assessment & Plan notes found for this encounter.  Ms. Mayzie Caughlin has a current medication list which includes the following long-term medication(s): albuterol, fluoxetine, fluticasone, gabapentin, metoprolol succinate, olmesartan-amlodipine-hctz, rosuvastatin, and spiriva handihaler.  Pharmacotherapy (Medications Ordered): No orders of the defined types were placed in this encounter.  Orders:  No orders of the defined types were placed in this encounter.  Follow-up plan:   No follow-ups on file.     Interventional Therapies  Risk  Complexity Considerations:   Estimated body mass index is 32.74 kg/m as calculated from the following:   Height as of this encounter: $RemoveBeforeD'5\' 2"'jUQFppIvKtWjKJ$  (1.575 m).   Weight as of this encounter: 179 lb (81.2 kg). WNL   Planned  Pending:   Diagnostic bilateral lumbar facet MBB #2    Under consideration:   Therapeutic bilateral lumbar facet RFA #1    Completed:   Diagnostic bilateral lumbar facet MBB x1 (07/11/2021) (100/80/100 x2 weeks)    Completed by Dr. Marland Kitchen [facility]):   1.  Bilateral L5-S1 transforaminal ESI (07/07/2014) 2.  Left L5-S1 transforaminal ESI + right S1 TFESI (07/31/2014)    Therapeutic  Palliative (PRN) options:   None established     Recent Visits Date Type Provider Dept  07/11/21 Office Visit Milinda Pointer, MD [facility]  Showing recent visits within past 90 days and meeting all other requirements Future Appointments Date Type  Provider Dept  10/09/21 Appointment Milinda Pointer, MD [facility]  Showing future appointments within next 90 days and meeting all other requirements  I discussed the assessment and treatment plan with the patient. The patient was provided an opportunity to ask questions and all were answered. The patient agreed with the plan and demonstrated an understanding of the instructions.  Patient advised to call back or seek an in-person evaluation if the symptoms or condition worsens.  Duration of encounter: *** minutes.  Total time on encounter, as per AMA guidelines included both the face-to-face and non-face-to-face time personally spent by the physician and/or other qualified health care professional(s) on the day of the encounter (includes time in activities that require the physician or other qualified health care professional and does not include time in activities normally performed by clinical staff). Physician's time may include the following activities when performed: preparing to see the patient (eg, review of tests, pre-charting  review of records) obtaining and/or reviewing separately obtained history performing a medically appropriate examination and/or evaluation counseling and educating the patient/family/caregiver ordering medications, tests, or procedures referring and communicating with other health care professionals (when not separately reported) documenting clinical information in the electronic or other health record independently interpreting results (not separately reported) and communicating results to the patient/ family/caregiver care coordination (not separately reported)  Note by: Gaspar Cola, MD Date: 10/09/2021; Time: 9:13 AM

## 2021-10-08 DIAGNOSIS — M47817 Spondylosis without myelopathy or radiculopathy, lumbosacral region: Secondary | ICD-10-CM | POA: Insufficient documentation

## 2021-10-09 ENCOUNTER — Encounter: Payer: Self-pay | Admitting: Pain Medicine

## 2021-10-09 ENCOUNTER — Ambulatory Visit: Payer: No Typology Code available for payment source | Attending: Pain Medicine | Admitting: Pain Medicine

## 2021-10-09 VITALS — BP 146/70 | HR 79 | Temp 97.3°F | Resp 16 | Ht 62.0 in | Wt 177.0 lb

## 2021-10-09 DIAGNOSIS — M4310 Spondylolisthesis, site unspecified: Secondary | ICD-10-CM | POA: Diagnosis present

## 2021-10-09 DIAGNOSIS — M545 Low back pain, unspecified: Secondary | ICD-10-CM

## 2021-10-09 DIAGNOSIS — M47817 Spondylosis without myelopathy or radiculopathy, lumbosacral region: Secondary | ICD-10-CM | POA: Diagnosis present

## 2021-10-09 DIAGNOSIS — R937 Abnormal findings on diagnostic imaging of other parts of musculoskeletal system: Secondary | ICD-10-CM | POA: Diagnosis present

## 2021-10-09 DIAGNOSIS — G8929 Other chronic pain: Secondary | ICD-10-CM | POA: Diagnosis present

## 2021-10-09 NOTE — Patient Instructions (Signed)
____________________________________________________________________________________________  Pain Prevention Technique  Definition:   A technique used to minimize the effects of an activity known to cause inflammation or swelling, which in turn leads to an increase in pain.  Purpose: To prevent swelling from occurring. It is based on the fact that it is easier to prevent swelling from happening than it is to get rid of it, once it occurs.  Contraindications: Anyone with allergy or hypersensitivity to the recommended medications. Anyone taking anticoagulants (Blood Thinners) (e.g., Coumadin, Warfarin, Plavix, etc.). Patients in Renal Failure.  Technique: Before you undertake an activity known to cause pain, or a flare-up of your chronic pain, and before you experience any pain, do the following:  On a full stomach, take 4 (four) over the counter Ibuprofens '200mg'$  tablets (Motrin), for a total of 800 mg. In addition, take over the counter Magnesium 400 to 500 mg, before doing the activity.  Six (6) hours later, again on a full stomach, repeat the Ibuprofen. That night, take a warm shower and stretch under the running warm water.  This technique may be sufficient to abort the pain and discomfort before it happens. Keep in mind that it takes a lot less medication to prevent swelling than it takes to eliminate it once it occurs.  ____________________________________________________________________________________________

## 2021-10-10 ENCOUNTER — Telehealth: Payer: Self-pay | Admitting: Pain Medicine

## 2021-10-10 ENCOUNTER — Other Ambulatory Visit: Payer: Self-pay | Admitting: *Deleted

## 2021-10-10 NOTE — Telephone Encounter (Signed)
Orders was put in on 10-09-21 for patient to go to physical therapy. Melissa from [facility]  call stating that the referred department need to be fill in [facility]  main rehab on the referral . Please give Melissa a call at [phone]. Thanks

## 2021-10-19 ENCOUNTER — Ambulatory Visit
Admission: RE | Admit: 2021-10-19 | Discharge: 2021-10-19 | Disposition: A | Discharge status: Home / Self Care | Payer: No Typology Code available for payment source | Source: Ambulatory Visit | Attending: Pain Medicine | Admitting: Pain Medicine

## 2021-10-19 DIAGNOSIS — R937 Abnormal findings on diagnostic imaging of other parts of musculoskeletal system: Secondary | ICD-10-CM | POA: Diagnosis present

## 2021-10-19 DIAGNOSIS — M545 Low back pain, unspecified: Secondary | ICD-10-CM | POA: Diagnosis present

## 2021-10-19 DIAGNOSIS — G8929 Other chronic pain: Secondary | ICD-10-CM | POA: Diagnosis present

## 2021-10-19 DIAGNOSIS — M47817 Spondylosis without myelopathy or radiculopathy, lumbosacral region: Secondary | ICD-10-CM | POA: Insufficient documentation

## 2021-10-19 DIAGNOSIS — M4310 Spondylolisthesis, site unspecified: Secondary | ICD-10-CM | POA: Insufficient documentation

## 2021-10-20 NOTE — Progress Notes (Unsigned)
Patient: Nichole Wells  Service Category: E/M  Provider: Gaspar Cola, MD  DOB: Jun 18, 1960  DOS: 10/21/2021  Location: Office  MRN: [id number]  Setting: Ambulatory outpatient  Referring Provider: Steele Sizer, MD  Type: Established Patient  Specialty: Interventional Pain Management  PCP: Steele Sizer, MD  Location: Remote location  Delivery: TeleHealth     Virtual Encounter - Pain Management PROVIDER NOTE: Information contained herein reflects review and annotations entered in association with encounter. Interpretation of such information and data should be left to medically-trained personnel. Information provided to patient can be located elsewhere in the medical record under "Patient Instructions". Document created using STT-dictation technology, any transcriptional errors that may result from process are unintentional.    Contact & Pharmacy Preferred: [phone] Home: [phone] (home) Mobile: [phone] (mobile) E-mail: [email or web address]  [id number] [place], [place] - [address]Phone: [phone]: [phone] [id number] [place]: [phone]: [phone] Pre-screening  Nichole Wells "in-person" vs "virtual" encounter. She indicated preferring virtual for this encounter.   Reason COVID-19*  Social distancing based on CDC and AMA recommendations.   I contacted JiTatumn Corbridgen 10/21/2021 via telephone.      I clearly identified myself as FrGaspar ColaMD. I verified that I was speaking with the correct person using two identifiers (Name: Nichole Caiand date of birth: 7/Nov 25, 1960  Consent I sought verbal advanced consent from JiJohnney Ouor virtual visit interactions. I informed Ms. ToWeesnerf possible security and privacy concerns, risks, and limitations associated with providing "not-in-person"  medical evaluation and management services. I also informed Ms. ToCafarof the availability of "in-person" appointments. Finally, I informed her that there would be a charge for the virtual visit and that she could be  personally, fully or partially, financially responsible for it. Ms. ToYankeyxpressed understanding and agreed to proceed.   Historic Elements   Nichole Wells a [age].o. year old, female patient evaluated today after our last contact on 10/10/2021. Ms. ToCorniahas a past medical history of Anxiety, Arthritis, GERD (gastroesophageal reflux disease), Heart murmur, Hypertension, Inflammatory arthritis, and Vitamin D deficiency. She also  has a past surgical history that includes Dilation and curettage of uterus; Cholecystectomy; Uterine fibroid embolization; Shoulder arthroscopy (Right); Tubal ligation; Breast surgery; laparoscopy (N/A, 12/10/2015); Laparoscopic lysis of adhesions (12/10/2015); Breast cyst aspiration (Right); Laparoscopic vaginal hysterectomy with salpingo oophorectomy (Bilateral, 01/28/2016); Breast biopsy (Left); Colonoscopy with propofol (N/A, 10/11/2019); and Colonoscopy with propofol (N/A, 06/26/2021). [name] a current medication list which includes the following prescription(s): albuterol, cetirizine, vitamin b-12, fluoxetine, fluticasone, gabapentin, metoprolol succinate, olmesartan-amlodipine-hctz, rosuvastatin, and vitamin d3. She  reports that she has been smoking cigarettes. She started smoking about 34 years ago. She has a 37.00 pack-year smoking history. She has never used smokeless tobacco. She reports current alcohol use. She reports that she does not use drugs. [name] allergic to hydroxychloroquine and penicillin g.   HPI  Today, she is being contacted for follow-up evaluation after referral to physical therapy and lumbar MRI (10/19/2021).  As of 10/21/2021 at 7:08 AM, the results of the MRI were not available in the patient's electronic  medical record for review.  MRI/radiology was called at 10:35 AM and a message was left since I was unable to speak to the radiologist.  Patient contacted at 10:40 AM to update her  with regards to the MRI report and also to ask her about the physical therapy referral.  She indicates that nobody has called her yet to set it up.  I have entered an order for the pain clinic administrative staff to follow-up on that.  Official report available later in the morning, and it reads as follows:  (10/19/2021) LUMBAR MRI FINDINGS: Segmentation: lowest disc designated L5-S1. Alignment: There is exaggerated lumbar lordosis. There is grade 1 retrolisthesis of L1 on L2 and trace retrolisthesis of L2 on L3, similar to 2016. Vertebrae: A probable benign intraosseous hemangioma in the L5 vertebral body is unchanged. There is degenerative endplate marrow signal abnormality with faint edema along the anterior T11-T12 disc space. Paraspinal and other soft tissues: There is mild perifacetal soft tissue edema on the left at L3-4 and to a lesser degree bilaterally at L4-5 and L5-S1.  DISC LEVELS: There is disc degeneration in the lower thoracic spine most advanced at T11-12. There is overall mild disc degeneration throughout the lumbar spine slightly progressed at L3-4 since 2016.  T12-L1: Shallow disc protrusion L1-2: There is grade 1 retrolisthesis with a small amount of superiorly migrated disc material in the left subarticular zone L2-3: There is trace retrolisthesis and mild facet arthropathy L3-4: There is moderate left worse than right facet arthropathy with a mild disc bulge eccentric to the left resulting in mild spinal canal and bilateral subarticular zone narrowing and no significant neural foraminal stenosis. The facet arthropathy and spinal canal narrowing has progressed since 2016. L4-5: There is moderate bilateral facet arthropathy with ligamentum flavum thickening resulting in mild bilateral subarticular zone  narrowing and no significant neural foraminal stenosis. The facet arthropathy and subarticular zone narrowing has slightly progressed since 2016. L5-S1: Moderate bilateral facet arthropathy without significant spinal canal or neural foraminal stenosis.  IMPRESSION: 1. Multilevel facet arthropathy most advanced at L3-4 through L5-S1 has overall progressed since 2016 with perifacetal soft tissue edema most notable on the left at L3-4 which could reflect a source of pain. 2. Mild spinal canal and subarticular zone narrowing at L3-4 and L4-5 without evidence of nerve root impingement. No significant neural foraminal stenosis in the lumbar spine. 3. Grade 1 retrolisthesis of L1 on L2 and L2 on L3, unchanged.   Today I have gone over the results of her MRI which I have explained to the patient in layman's terms.  Again, the MRI simply confirms everything that we have suspected from the very beginning that the patient is having facet joint pain and a facet syndrome.  At this point the only other thing that we need to do to comply with all of the roadblocks thrown by the insurance company is to have the patient do the physical therapy trial.  Once we completed that, if the patient has not had any significant improvement of her pain then we will go back to our initial request of the lumbar facet block for the purpose of determining whether or not she would be a good candidate for radiofrequency ablation.  Pharmacotherapy Assessment   Opioid Analgesic: None MME/day: 0 mg/day   Monitoring: [place] PMP: PDMP reviewed during this encounter.       Pharmacotherapy: No side-effects or adverse reactions reported. Compliance: No problems identified. Effectiveness: Clinically acceptable. Plan: Refer to "POC". UDS:  Summary  Date Value Ref Range Status  11/14/2020 Note  Final    Comment:    ==================================================================== Compliance Drug Analysis,  Ur ==================================================================== Test  Result       Flag       Units  Drug Present   Fluoxetine                     PRESENT   Norfluoxetine                  PRESENT    Norfluoxetine is an expected metabolite of fluoxetine.    Naproxen                       PRESENT   Metoprolol                     PRESENT ==================================================================== Test                      Result    Flag   Units      Ref Range   Creatinine              63               mg/dL      >=20 ==================================================================== Declared Medications:  Medication list was not provided. ==================================================================== For clinical consultation, please call [phone]. ====================================================================    No results found for: "CBDTHCR", "D8THCCBX", "D9THCCBX"   Laboratory Chemistry Profile   Renal Lab Results  Component Value Date   BUN 16 09/26/2021   CREATININE 0.65 09/26/2021   LABCREA 75 09/81/1914   BCR NOT APPLICABLE 78/29/5621   GFRAA 124 07/12/2020   GFRNONAA >60 09/26/2021    Hepatic Lab Results  Component Value Date   AST 27 06/12/2021   ALT 30 (H) 06/12/2021   ALBUMIN 4.4 11/14/2020   ALKPHOS 109 11/14/2020   HCVAB NON-REACTIVE 09/23/2016   LIPASE 36 04/29/2015    Electrolytes Lab Results  Component Value Date   NA 140 09/26/2021   K 3.9 09/26/2021   CL 108 09/26/2021   CALCIUM 9.9 09/26/2021   MG 1.9 11/14/2020    Bone Lab Results  Component Value Date   VD25OH 53 07/20/2019   25OHVITD1 47 11/14/2020   25OHVITD2 <1.0 11/14/2020   25OHVITD3 46 11/14/2020    Inflammation (CRP: Acute Phase) (ESR: Chronic Phase) Lab Results  Component Value Date   CRP 2 11/14/2020   ESRSEDRATE 14 11/14/2020         Note: Above Lab results reviewed.  Imaging  MR LUMBAR SPINE WO  CONTRAST CLINICAL DATA:  Low back pain worsening over 18 months. Periodically pain radiates down left leg.  EXAM: MRI LUMBAR SPINE WITHOUT CONTRAST  TECHNIQUE: Multiplanar, multisequence MR imaging of the lumbar spine was performed. No intravenous contrast was administered.  COMPARISON:  Lumbar spine radiographs 11/14/2020, MRI 05/31/2014  FINDINGS: Segmentation: Standard; the lowest formed disc space is designated L5-S1.  Alignment: There is exaggerated lumbar lordosis. There is grade 1 retrolisthesis of L1 on L2 and trace retrolisthesis of L2 on L3, similar to 2016. There is no other antero or retrolisthesis.  Vertebrae: The body heights are preserved. Background marrow signal is normal. A probable benign intraosseous hemangioma in the L5 vertebral body is unchanged. There is degenerative endplate marrow signal abnormality with faint edema along the anterior T11-T12 disc space. There is no suspicious marrow signal abnormality. There is no other marrow edema.  Conus medullaris and cauda equina: Conus extends to the T12-L1 level. Conus and cauda equina appear normal.  Paraspinal and other soft tissues: There is  mild perifacetal soft tissue edema on the left at L3-L4 and to a lesser degree bilaterally at L4-L5 and L5-S1. The paraspinal soft tissues are otherwise unremarkable.  Disc levels:  There is disc degeneration in the lower thoracic spine most advanced at T11-T12. There is overall mild disc degeneration throughout the lumbar spine slightly progressed at L3-L4 since 2016.  T12-L1: Shallow disc protrusion but no significant spinal canal or neural foraminal stenosis.  L1-L2: There is grade 1 retrolisthesis with a small amount of superiorly migrated disc material in the left subarticular zone but no significant spinal canal or neural foraminal stenosis. Findings are unchanged.  L2-L3: There is trace retrolisthesis and mild facet arthropathy without significant  spinal canal or neural foraminal stenosis  L3-L4: There is moderate left worse than right facet arthropathy with a mild disc bulge eccentric to the left resulting in mild spinal canal and bilateral subarticular zone narrowing and no significant neural foraminal stenosis. The facet arthropathy and spinal canal narrowing has progressed since 2016.  L4-L5: There is moderate bilateral facet arthropathy with ligamentum flavum thickening resulting in mild bilateral subarticular zone narrowing and no significant neural foraminal stenosis. The facet arthropathy and subarticular zone narrowing has slightly progressed since 2016.  L5-S1: Moderate bilateral facet arthropathy without significant spinal canal or neural foraminal stenosis.  IMPRESSION: 1. Multilevel facet arthropathy most advanced at L3-L4 through L5-S1 has overall progressed since 2016 with perifacetal soft tissue edema most notable on the left at L3-L4 which could reflect a source of pain. 2. Mild spinal canal and subarticular zone narrowing at L3-L4 and L4-L5 without evidence of nerve root impingement. No significant neural foraminal stenosis in the lumbar spine. 3. Grade 1 retrolisthesis of L1 on L2 and L2 on L3, unchanged.  Electronically Signed   By: Valetta Mole M.D.   On: 10/21/2021 10:29  Assessment  The primary encounter diagnosis was Chronic low back pain (1ry area of Pain) (Bilateral) (L>R) w/o sciatica. 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  Plan of Care  Problem-specific:  No problem-specific Assessment & Plan notes found for this encounter.  Ms. Jajaira Ruis has a current medication list which includes the  following long-term medication(s): albuterol, fluoxetine, fluticasone, gabapentin, metoprolol succinate, olmesartan-amlodipine-hctz, and rosuvastatin.  Pharmacotherapy (Medications Ordered): No orders of the defined types were placed in this encounter.  Orders:  No orders of the defined types were placed in this encounter.  Follow-up plan:   Return for Eval-day (M,W), (F2F) after she has completed the physical therapy trial..     Interventional Therapies  Risk  Complexity Considerations:   Estimated body mass index is 32.74 kg/m as calculated from the following:   Height as of this encounter: '5\' 2"'  (1.575 m).   Weight as of this encounter: 179 lb (81.2 kg). WNL   Planned  Pending:   Diagnostic bilateral lumbar facet MBB #2    Under consideration:   Therapeutic bilateral lumbar facet RFA #1    Completed:   Diagnostic bilateral lumbar facet MBB x1 (07/11/2021) (100/80/100 x2 weeks)    Completed by Dr. Marland Kitchen [facility]):   1.  Bilateral L5-S1 transforaminal ESI (07/07/2014) 2.  Left L5-S1 transforaminal ESI + right S1 TFESI (07/31/2014)    Therapeutic  Palliative (PRN) options:  None established     Recent Visits Date Type Provider Dept  10/09/21 Office Visit Milinda Pointer, MD [facility]  Showing recent visits within past 90 days and meeting all other requirements Today's Visits Date Type Provider Dept  10/21/21 Office Visit Milinda Pointer, MD [facility]  Showing today's visits and meeting all other requirements Future Appointments No visits were found meeting these conditions. Showing future appointments within next 90 days and meeting all other requirements  I discussed the assessment and treatment plan with the patient. The patient was provided an opportunity to ask questions and all were answered. The patient agreed with the plan and demonstrated an understanding of the instructions.  Patient advised to call back or seek an  in-person evaluation if the symptoms or condition worsens.  Duration of encounter: 18 minutes.  Note by: Gaspar Cola, MD Date: 10/21/2021; Time: 4:34 PM

## 2021-10-21 ENCOUNTER — Ambulatory Visit: Payer: No Typology Code available for payment source | Attending: Pain Medicine | Admitting: Pain Medicine

## 2021-10-21 ENCOUNTER — Telehealth: Payer: Self-pay | Admitting: *Deleted

## 2021-10-21 DIAGNOSIS — M47817 Spondylosis without myelopathy or radiculopathy, lumbosacral region: Secondary | ICD-10-CM

## 2021-10-21 DIAGNOSIS — M545 Low back pain, unspecified: Secondary | ICD-10-CM

## 2021-10-21 DIAGNOSIS — R937 Abnormal findings on diagnostic imaging of other parts of musculoskeletal system: Secondary | ICD-10-CM | POA: Diagnosis not present

## 2021-10-21 DIAGNOSIS — M4310 Spondylolisthesis, site unspecified: Secondary | ICD-10-CM

## 2021-10-21 DIAGNOSIS — G8929 Other chronic pain: Secondary | ICD-10-CM

## 2021-10-21 NOTE — Telephone Encounter (Signed)
Attempted to call for pre appointment review of allergies/meds. Message left. 

## 2021-10-23 DIAGNOSIS — R002 Palpitations: Secondary | ICD-10-CM | POA: Insufficient documentation

## 2021-11-13 ENCOUNTER — Ambulatory Visit: Payer: No Typology Code available for payment source | Attending: Pain Medicine

## 2021-11-13 DIAGNOSIS — M5459 Other low back pain: Secondary | ICD-10-CM | POA: Insufficient documentation

## 2021-11-13 DIAGNOSIS — M6281 Muscle weakness (generalized): Secondary | ICD-10-CM | POA: Diagnosis present

## 2021-11-13 DIAGNOSIS — R937 Abnormal findings on diagnostic imaging of other parts of musculoskeletal system: Secondary | ICD-10-CM | POA: Diagnosis not present

## 2021-11-13 DIAGNOSIS — M4310 Spondylolisthesis, site unspecified: Secondary | ICD-10-CM | POA: Insufficient documentation

## 2021-11-13 DIAGNOSIS — R262 Difficulty in walking, not elsewhere classified: Secondary | ICD-10-CM | POA: Diagnosis present

## 2021-11-13 DIAGNOSIS — M545 Low back pain, unspecified: Secondary | ICD-10-CM | POA: Diagnosis not present

## 2021-11-13 DIAGNOSIS — G8929 Other chronic pain: Secondary | ICD-10-CM | POA: Diagnosis present

## 2021-11-13 DIAGNOSIS — M5442 Lumbago with sciatica, left side: Secondary | ICD-10-CM | POA: Insufficient documentation

## 2021-11-13 DIAGNOSIS — M47817 Spondylosis without myelopathy or radiculopathy, lumbosacral region: Secondary | ICD-10-CM | POA: Diagnosis not present

## 2021-11-13 NOTE — Therapy (Signed)
[facility] [address], [place], [address] Phone: [phone]   Fax:  [phone]  Physical Therapy Evaluation  Patient Details  Name: Nichole Wells MRN: [id number] Date of Birth: November 26, 1960 Referring Provider (PT): Milinda Pointer, MD   Encounter Date: 11/13/2021   PT End of Session - 11/13/21 1423     Visit Number 1    Number of Visits 17    Date for PT Re-Evaluation 01/09/22    Authorization Type 1    Authorization Time Period of 10 progress report    Authorization - Visit Number 1    Authorization - Number of Visits 120   per calendar year   PT Start Time 1423    PT Stop Time 1526    PT Time Calculation (min) 63 min    Activity Tolerance Patient tolerated treatment well    Behavior During Therapy WFL for tasks assessed/performed             Past Medical History:  Diagnosis Date   Anxiety    Arthritis    poss ra   GERD (gastroesophageal reflux disease)    occ   Heart murmur    Hypertension    Inflammatory arthritis    Vitamin D deficiency     Past Surgical History:  Procedure Laterality Date   BREAST BIOPSY Left    stereo- neg   BREAST CYST ASPIRATION Right    neg   BREAST SURGERY     benign biopsy   CHOLECYSTECTOMY     COLONOSCOPY WITH PROPOFOL N/A 10/11/2019   Procedure: COLONOSCOPY WITH PROPOFOL;  Surgeon: [name], MD;  Location: [facility] ENDOSCOPY;  Service: Gastroenterology;  Laterality: N/A;   COLONOSCOPY WITH PROPOFOL N/A 06/26/2021   Procedure: COLONOSCOPY WITH PROPOFOL;  Surgeon: [name], MD;  Location: [facility] ENDOSCOPY;  Service: Gastroenterology;  Laterality: N/A;   DILATION AND CURETTAGE OF UTERUS     LAPAROSCOPIC LYSIS OF ADHESIONS  12/10/2015   Procedure: LAPAROSCOPIC LYSIS OF ADHESIONS;  Surgeon: Brayton Mars, MD;  Location: [facility] ORS;  Service: Gynecology;;   LAPAROSCOPIC VAGINAL HYSTERECTOMY WITH SALPINGO OOPHORECTOMY Bilateral 01/28/2016    Procedure: LAPAROSCOPIC ASSISTED VAGINAL HYSTERECTOMY WITH SALPINGO OOPHORECTOMY;  Surgeon: Brayton Mars, MD;  Location: [facility] ORS;  Service: Gynecology;  Laterality: Bilateral;   LAPAROSCOPY N/A 12/10/2015   Procedure: LAPAROSCOPY DIAGNOSTIC WITH BIOPSIES;  Surgeon: Brayton Mars, MD;  Location: [facility] ORS;  Service: Gynecology;  Laterality: N/A;   SHOULDER ARTHROSCOPY Right    TUBAL LIGATION     UTERINE FIBROID EMBOLIZATION      There were no vitals filed for this visit.    Subjective Assessment - 11/13/21 1426     Subjective Low back: 6/10 currently (pt sitting on chair), 10/10 at worst for the past 3 months.    Pertinent History Low back pain. Predominantly L Low back. pain only went down L LE only 2x for the past couple of months but went away quickly (posterior hip and thigh along the sciatic pathway L side). Pain began several years ago, gradual onset. Back currently hurts all the time. Tried PT several years ago for her back which did not help (did LTR, naval ins, S/L hip abduction, e-stim; no manual therapy).    Patient Stated Goals Decrease pain, pain relief.    Currently in Pain? Yes    Pain Location Back    Pain Orientation Posterior;Left;Lower    Pain Descriptors / Indicators Dull;Aching  Pain Type Chronic pain    Pain Radiating Towards L low back, posterior hip/sciatic pathway to proximal thigh.    Pain Onset More than a month ago    Pain Frequency Constant    Aggravating Factors  getting up and moving, standing (5 minutes), sitting (sometimes), being up on her feet all day 9 hours (pt walks around all day, bending; pt works as a Occupational psychologist), Industrial/product designer.    Pain Relieving Factors Sitting.  Heat, ice, lying down on her L side.                [facility] PT Assessment - 11/13/21 1425       Assessment   Medical Diagnosis M54.50 (ICD-10-CM) - Low back pain of over 3 months duration  M47.817 (ICD-10-CM) - Lumbosacral facet joint syndrome  M43.10  (ICD-10-CM) - Retrolisthesis  M54.50,G89.29 (ICD-10-CM) - Chronic bilateral low back pain without sciatica  M47.817 (ICD-10-CM) - Facet arthropathy, lumbosacral  M47.817 (ICD-10-CM) - Spondylosis without myelopathy or radiculopathy, lumbosacral region  R93.7 (ICD-10-CM) - Abnormal MRI, lumbar spine  M47.817 (ICD-10-CM) - Facet hypertrophy of lumbosacral region    Referring Provider (PT) Milinda Pointer, MD    Onset Date/Surgical Date 10/09/21    Prior Therapy Yes, did not help      Precautions   Precaution Comments osteoporosis      Restrictions   Other Position/Activity Restrictions No known restrictions      Balance Screen   Has the patient fallen in the past 6 months Yes    How many times? 1   tripped on something a couple of weeks ago, was not paying attention, not a regular occurence.   Has the patient had a decrease in activity level because of a fear of falling?  No    Is the patient reluctant to leave their home because of a fear of falling?  No      Home Environment   Additional Comments Pt lives in a 1 story home with husband. No steps to enter.      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 L PSIS, L5 TP more anterior      AROM   Lumbar Flexion very limited with low back pain reproduction   eases to baseline pain in upright posture   Lumbar Extension limited with L low back pain    Lumbar - Right Side Bend limited, decreased pain in position, increased pain returning to upright position    Lumbar - Left Side Bend limited with L low back pain    Lumbar - Right Rotation WFL with low back pain    Lumbar - Left Rotation WFL, slight decrease in pain in position. Pain returns in starting position.      Strength   Right Hip Flexion 4-/5    Right Hip Extension  3+/5    Right Hip ABduction 4/5    Left Hip Flexion 4-/5    Left Hip Extension 4-/5   with L low back pain   Left Hip ABduction 4/5    Right Knee Flexion 4+/5    Right Knee Extension 5/5    Left Knee Flexion 4-/5   with L low back pain   Left Knee Extension 5/5  Palpation   Palpation comment TTP L T5 TP and L PSIS in standing, L L5 and  L4 TP in prone      Ambulation/Gait   Gait Comments Gait: decreased stance L LE, decreased lumbopelvic control                        Objective measurements completed on examination: See above findings.         Blood pressure is controlled per pt  Precautions: Pt states having osteoporosis         Decreased low back pain with L lateral shift correction  Slump test: (+) L LE with reproduction of sciatic symptoms, (+) R LE with posterior hip and proximal thigh symptoms.    Prone with 1 thick pillow under abdomen decreases low back pain (decreased low back pressure observed)    Therapeutic exercise  Prone glute max/quad set   R 4x5 seconds  L 4x5 seconds. L low back pain   L S/L R hip abduction (to decreased L lateral shift posture of lumbar spine. 8x. Challenging towards the end. Then 5x.   Sit <> stand with transversus abdominis contraction. Improved comfort with transfer   Standing L lateral shift correction 4x5 seconds, then 5x5 seconds. Low back discomfort, eases gradually with rest  Seated manually resisted R lateral shift isometrics in neutral to correct posture, back resting on chair back rest  10x5 seconds for 2 sets    Seated transversus abdominis contraction 10x5 seconds    Improved exercise technique, movement at target joints, use of target muscles after mod verbal, visual, tactile cues.        Access Code: [email or web address] URL: [email or web address] Date: 11/13/2021 Prepared by: Joneen Boers  Exercises - Seated Transversus Abdominis Bracing  - 5 x daily - 7 x weekly - 3  sets - 10 reps - 5 seconds hold       Response to treatment 0/10 currently in sitting and during standing and gait after session with activation of transversus abdominis and glute max muscles   Clinical impression Pt is a 61 year old female who came to physical therapy secondary to chronic low back pain. She also presents with altered gait pattern and posture, B sciatic neural tension L > R, altered gait pattern and posture, decreased trunk and hip strength, and difficulty tolerating the sitting, and standing positions as well as difficulty with gait, stair negotiation, and performing tasks which involve bending over due to pain. Pt will benefit from skilled physical therapy services to address the aforementioned deficits.                       PT Education - 11/13/21 1546     Education Details ther-ex, HEP, POC    Person(s) Educated Patient    Methods Explanation;Demonstration;Tactile cues;Verbal cues    Comprehension Verbalized understanding;Returned demonstration              PT Short Term Goals - 11/13/21 1539       PT SHORT TERM GOAL #1   Title Pt will be independent with her initial HEP to decrease pain, improve strength and function.    Baseline Pt has started her initial HEP (11/13/2021)    Time 3    Period Weeks    Status New    Target Date 12/05/21               PT Long Term Goals - 11/13/21 1541  PT LONG TERM GOAL #1   Title Pt will have a decrease in low back pain to 4/10 or less at worst to promote ability to move around, stand, sit, bend over, ambulate, negotiate stairs more comfortably.    Baseline 10/10 low back pain at worst for the past 3 months (11/13/2021)    Time 8    Period Weeks    Status New    Target Date 01/09/22      PT LONG TERM GOAL #2   Title Pt will improve her lumbar FOTO score by at least 10 points as a demonstration of improved function.    Baseline Lumbar Spine FOTO 53 (11/13/2021)    Time 8    Period  Weeks    Status New    Target Date 01/09/22      PT LONG TERM GOAL #3   Title Pt will improve bilateral hip extension and abduction strength by at least 1/2 MMT grade to promote ability to ambulate, perform standing tasks, negotiate stairs with less pain.    Baseline Hip extension 3+/5 R, 4-/5 L, hip abduction 4/5 R and L (11/13/2021)    Time 8    Period Weeks    Status New    Target Date 01/09/22                    Plan - 11/13/21 1549     Clinical Impression Statement Pt is a 61 year old female who came to physical therapy secondary to chronic low back pain. She also presents with altered gait pattern and posture, B sciatic neural tension L > R, altered gait pattern and posture, decreased trunk and hip strength, and difficulty tolerating the sitting, and standing positions as well as difficulty with gait, stair negotiation, and performing tasks which involve bending over due to pain. Pt will benefit from skilled physical therapy services to address the aforementioned deficits.    Personal Factors and Comorbidities Comorbidity 3+;Past/Current Experience;Profession;Time since onset of injury/illness/exacerbation    Comorbidities Anxiety, arthritis, HTN, inflammatory arthritis    Examination-Activity Limitations Stairs;Stand;Bend;Sit;Lift;Carry;Transfers;Locomotion Level    Stability/Clinical Decision Making Stable/Uncomplicated    Clinical Decision Making Low    Rehab Potential Fair    PT Frequency 2x / week    PT Duration 8 weeks    PT Treatment/Interventions Therapeutic exercise;Therapeutic activities;Neuromuscular re-education;Patient/family education;Manual techniques;Dry needling;Aquatic Therapy;Electrical Stimulation;Iontophoresis '4mg'$ /ml Dexamethasone    PT Next Visit Plan posture, trunk and glute strengthening, lumbopelvic control, manual techniques, modalities PRN    PT Home Exercise Plan Medbridge Access Code: [email or web address]    Consulted and Agree with Plan of Care Patient              Patient will benefit from skilled therapeutic intervention in order to improve the following deficits and impairments:  Pain, Postural dysfunction, Improper body mechanics, Decreased strength, Difficulty walking, Decreased range of motion  Visit Diagnosis: Other low back pain - Plan: PT plan of care cert/re-cert  Chronic left-sided low back pain with left-sided sciatica - Plan: PT plan of care cert/re-cert  Difficulty in walking, not elsewhere classified - Plan: PT plan of care cert/re-cert  Muscle weakness (generalized) - Plan: PT plan of care cert/re-cert     Problem List Patient Active Problem List   Diagnosis Date Noted   Lumbosacral facet hypertrophy ( L3-4, L4-5, and L5-S1) (Bilateral) 10/08/2021    Class: Chronic   Perennial allergic rhinitis 08/26/2021   Essential hypertension 07/24/2021   Vitamin B12 deficiency 07/24/2021  Abnormal MRI, lumbar spine (06/03/2014 & 9/9 /2023) 06/13/2021   Low back pain of over 3 months duration 05/16/2021   Spondylosis without myelopathy or radiculopathy, lumbosacral region 05/16/2021   DDD (degenerative disc disease), cervical (Multilevel) 01/14/2021   Cervical facet arthropathy (Multilevel) (Bilateral) 01/14/2021   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 01/14/2021   Retrolisthesis of lumbar spine at L1/L2 and L2/L3 01/14/2021   Lumbosacral facet arthropathy (Multilevel) (Bilateral) 01/14/2021   Osteoarthritis of hips (Bilateral) 01/14/2021   Osteoarthritis of wrist (Right) 01/14/2021   Osteoarthritis of first carpometacarpal joint of hand (Right) 01/14/2021   Chronic pain syndrome 11/14/2020   Disorder of skeletal system 11/14/2020   Problems influencing health status 11/14/2020   Chronic low back pain (1ry area of Pain) (Bilateral) (L>R) w/o sciatica 11/14/2020    Class: Chronic   Lumbosacral facet syndrome (Bilateral) 11/14/2020   Chronic wrist pain (2ry area of Pain) (Right) 11/14/2020   History of  carpal tunnel surgery of wrist (Right) 11/14/2020   Chronic neck pain (3ry area of Pain) (Bilateral) (L>R) 11/14/2020   Cervicalgia 11/14/2020   Cervical facet syndrome 11/14/2020   Chronic hip pain (4th area of Pain) (Bilateral) 11/14/2020   Recurrent major depressive disorder, in remission ([place]) 02/18/2018   Thoracic aorta atherosclerosis ([place]) 11/10/2017   Centrilobular emphysema ([place]) 11/10/2017   Abnormal liver CT 11/10/2017   Seronegative inflammatory arthritis 05/07/2017   Tobacco use 05/07/2017   Vitamin D deficiency 05/28/2016   Surgical menopause on hormone replacement therapy 04/23/2016   Status post laparoscopic assisted vaginal hysterectomy (LAVH) 01/28/2016   Family history of ovarian cancer 11/13/2015   Insomnia 03/27/2015   Uncontrolled hypertension 08/30/2014   Anxiety and depression 08/30/2014   Cardiac murmur 08/30/2014   HLD (hyperlipidemia) 08/30/2014   DDD (degenerative disc disease), lumbar 07/26/2014    Daveigh Batty, PT 11/13/2021, 4:04 PM  [facility] [address], [place], [address] Phone: [phone]   Fax:  [phone]  Name: Nichole Wells MRN: [id number] Date of Birth: Sep 15, 1960

## 2021-11-18 ENCOUNTER — Ambulatory Visit: Payer: No Typology Code available for payment source

## 2021-11-18 ENCOUNTER — Telehealth: Payer: Self-pay

## 2021-11-18 NOTE — Telephone Encounter (Signed)
No show. Called patient and left a message pertaining to appointment and a reminder for the next follow up session.

## 2021-11-25 ENCOUNTER — Ambulatory Visit: Payer: No Typology Code available for payment source

## 2021-11-25 NOTE — Progress Notes (Unsigned)
Name: Nichole Wells   MRN: [id number]    DOB: 04-14-1960   Date:11/26/2021       Progress Note  Subjective  Chief Complaint  Follow Up  HPI  Emphysema with COPD  : she is unable to afford Trelegy or Brezti, we will try Spiriva  She has a history of smoking 1 pack daily for over 30 years. She is smoking only 6 cigarettes daily and trying to quit, we will place a new referral for lung cancer screening today    Inflammatory arthritis: she missed follow up with Dr. Jefm Bryant , she has been off Methotrexate for over 2 years, he has stiffness and pain on both hands, also on her back.  . She still has Raynaud's . Reminded her again to go back to Rheumatologist . She states unable to afford it at this time, going to the pain clinic   Chronic low back pain : going to the pain clinic, she is getting PT and waiting to get approval for steroid injections.    HTN: taking medication and bp  has been well controlled, it was high when she first arrived but normalized before she left. She is compliant with mediations. Had a severe episode of palpitation went to [facility] back in August, has seen cardiologist, had a 3 day zio patch that was unremarkable. She has only had one more episode side but brief.    Hyperlipidemia/Aorta atherosclerosis:Atherosclerosis found on CT chest. Last LDL was 121 but she is now taking crestor in the mornings and it has increased her compliance, no side effects    Major Depression: she was diagnosed in her 71's, she has been taking Prozac for many years, tried other medication in the past. Initially seen by psychiatrist and had psychotherapy.  Still in remission and doing well. She does not want to stop medication because she had severe episode that required hospitalization in her 77's for suicide thoughts. She has wellbutrin at home but has not started it yet, she will take to try quitting smoking    Hyperglycemia: she denies polyphagia, polydipsia or polyuria. last A1C was normal at 5.5 %  , it was as high as 5.8 % in the past.  Continue life style modification . Unchanged    Patient Active Problem List   Diagnosis Date Noted   Heart palpitations 10/23/2021   Lumbosacral facet hypertrophy ( L3-4, L4-5, and L5-S1) (Bilateral) 10/08/2021    Class: Chronic   Perennial allergic rhinitis 08/26/2021   Essential hypertension 07/24/2021   Vitamin B12 deficiency 07/24/2021   Abnormal MRI, lumbar spine (06/03/2014 & 9/9 /2023) 06/13/2021   Low back pain of over 3 months duration 05/16/2021   Spondylosis without myelopathy or radiculopathy, lumbosacral region 05/16/2021   DDD (degenerative disc disease), cervical (Multilevel) 01/14/2021   Cervical facet arthropathy (Multilevel) (Bilateral) 01/14/2021   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 01/14/2021   Retrolisthesis of lumbar spine at L1/L2 and L2/L3 01/14/2021   Lumbosacral facet arthropathy (Multilevel) (Bilateral) 01/14/2021   Osteoarthritis of hips (Bilateral) 01/14/2021   Osteoarthritis of wrist (Right) 01/14/2021   Osteoarthritis of first carpometacarpal joint of hand (Right) 01/14/2021   Chronic pain syndrome 11/14/2020   Disorder of skeletal system 11/14/2020   Problems influencing health status 11/14/2020   Chronic low back pain (1ry area of Pain) (Bilateral) (L>R) w/o sciatica 11/14/2020    Class: Chronic   Lumbosacral facet syndrome (Bilateral) 11/14/2020   Chronic wrist pain (2ry area of Pain) (Right) 11/14/2020   History  of carpal tunnel surgery of wrist (Right) 11/14/2020   Chronic neck pain (3ry area of Pain) (Bilateral) (L>R) 11/14/2020   Cervicalgia 11/14/2020   Cervical facet syndrome 11/14/2020   Chronic hip pain (4th area of Pain) (Bilateral) 11/14/2020   Recurrent major depressive disorder, in remission ([place]) 02/18/2018   Thoracic aorta atherosclerosis ([place]) 11/10/2017   Centrilobular emphysema ([place]) 11/10/2017   Abnormal liver CT 11/10/2017   Seronegative inflammatory arthritis 05/07/2017    Tobacco use 05/07/2017   Vitamin D deficiency 05/28/2016   Surgical menopause on hormone replacement therapy 04/23/2016   Status post laparoscopic assisted vaginal hysterectomy (LAVH) 01/28/2016   Family history of ovarian cancer 11/13/2015   Insomnia 03/27/2015   Uncontrolled hypertension 08/30/2014   Anxiety and depression 08/30/2014   Cardiac murmur 08/30/2014   HLD (hyperlipidemia) 08/30/2014   DDD (degenerative disc disease), lumbar 07/26/2014    Past Surgical History:  Procedure Laterality Date   BREAST BIOPSY Left    stereo- neg   BREAST CYST ASPIRATION Right    neg   BREAST SURGERY     benign biopsy   CHOLECYSTECTOMY     COLONOSCOPY WITH PROPOFOL N/A 10/11/2019   Procedure: COLONOSCOPY WITH PROPOFOL;  Surgeon: [name], MD;  Location[facility] ENDOSCOPY;  Service: Gastroenterology;  Laterality: N/A;   COLONOSCOPY WITH PROPOFOL N/A 06/26/2021   Procedure: COLONOSCOPY WITH PROPOFOL;  Surgeon: [name], MD;  Location: [facility] ENDOSCOPY;  Service: Gastroenterology;  Laterality: N/A;   DILATION AND CURETTAGE OF UTERUS     LAPAROSCOPIC LYSIS OF ADHESIONS  12/10/2015   Procedure: LAPAROSCOPIC LYSIS OF ADHESIONS;  Surgeon: Brayton Mars, MD;  Location: [facility] ORS;  Service: Gynecology;;   LAPAROSCOPIC VAGINAL HYSTERECTOMY WITH SALPINGO OOPHORECTOMY Bilateral 01/28/2016   Procedure: LAPAROSCOPIC ASSISTED VAGINAL HYSTERECTOMY WITH SALPINGO OOPHORECTOMY;  Surgeon: Brayton Mars, MD;  Location: [facility] ORS;  Service: Gynecology;  Laterality: Bilateral;   LAPAROSCOPY N/A 12/10/2015   Procedure: LAPAROSCOPY DIAGNOSTIC WITH BIOPSIES;  Surgeon: Brayton Mars, MD;  Location: [facility] ORS;  Service: Gynecology;  Laterality: N/A;   SHOULDER ARTHROSCOPY Right    TUBAL LIGATION     UTERINE FIBROID EMBOLIZATION      Family History  Problem Relation Age of Onset   Cancer Mother        cervical, lung   Lung cancer Mother    Heart disease Father    Hypertension Father    COPD  Father    Breast cancer Maternal Aunt    Ovarian cancer Maternal 69    Spina bifida Sister    Diabetes Son    Diabetes Maternal Grandmother    Heart disease Maternal Grandmother    Parkinson's disease Paternal Grandfather     Social History   Tobacco Use   Smoking status: Every Day    Packs/day: 1.00    Years: 37.00    Total pack years: 37.00    Types: Cigarettes    Start date: 10/06/1987   Smokeless tobacco: Never  Substance Use Topics   Alcohol use: Yes    Alcohol/week: 0.0 standard drinks of alcohol    Comment: none last 24hr     Current Outpatient Medications:    albuterol (VENTOLIN HFA) 108 (90 Base) MCG/ACT inhaler, Inhale 2 puffs into the lungs every 4 (four) hours as needed for wheezing or shortness of breath., Disp: 8.5 each, Rfl: 1   cetirizine (ZYRTEC) 10 MG tablet, Take 10 mg by mouth daily., Disp: , Rfl:    Cyanocobalamin (VITAMIN B-12) 500 MCG SUBL,  Place 1 tablet (500 mcg total) under the tongue daily at 12 noon., Disp: 100 tablet, Rfl: 1   FLUoxetine (PROZAC) 40 MG capsule, TAKE 1 CAPSULE BY MOUTH EVERY DAY, Disp: 90 capsule, Rfl: 1   fluticasone (FLONASE) 50 MCG/ACT nasal spray, SPRAY 2 SPRAYS INTO EACH NOSTRIL EVERY DAY, Disp: 48 mL, Rfl: 1   gabapentin (NEURONTIN) 100 MG capsule, Take 1-2 capsules (100-200 mg total) by mouth at bedtime., Disp: 180 capsule, Rfl: 1   metoprolol succinate (TOPROL-XL) 50 MG 24 hr tablet, Take 1 tablet (50 mg total) by mouth daily. Take with or immediately following a meal., Disp: 90 tablet, Rfl: 1   Olmesartan-amLODIPine-HCTZ 40-10-12.5 MG TABS, Take 1 tablet by mouth daily., Disp: 90 tablet, Rfl: 1   rosuvastatin (CRESTOR) 40 MG tablet, Take 1 tablet (40 mg total) by mouth daily., Disp: 90 tablet, Rfl: 1   Vitamin D, Cholecalciferol, 50 MCG (2000 UT) CAPS, Take by mouth daily., Disp: , Rfl:   Allergies  Allergen Reactions   Hydroxychloroquine Itching    Pt take zyrtec to alleviate symptoms    Penicillin G Hives    Has  patient had a PCN reaction causing immediate rash, facial/tongue/throat swelling, SOB or lightheadedness with hypotension:unsure Has patient had a PCN reaction causing severe rash involving mucus membranes or skin necrosis:unsure Has patient had a PCN reaction that required hospitalization:unsure Has patient had a PCN reaction occurring within the last 10 years:No If all of the above answers are "NO", then may proceed with Cephalosporin use.     I personally reviewed active problem list, medication list, allergies, family history, social history, health maintenance with the patient/caregiver today.   ROS  Constitutional: Negative for fever or weight change.  Respiratory: Negative for cough and shortness of breath.   Cardiovascular: Negative for chest pain or palpitations.  Gastrointestinal: Negative for abdominal pain, no bowel changes.  Musculoskeletal: Negative for gait problem or joint swelling.  Skin: Negative for rash.  Neurological: Negative for dizziness or headache.  No other specific complaints in a complete review of systems (except as listed in HPI above).   Objective  Vitals:   11/26/21 0832 11/26/21 0908  BP: (!) 160/84 (!) 170/90  Pulse: 63   Resp: 16   Temp: 98.1 F (36.7 C)   TempSrc: Oral   SpO2: 95%   Weight: 175 lb 14.4 oz (79.8 kg)   Height: '5\' 2"'$  (1.575 m)     Body mass index is 32.17 kg/m.  Physical Exam  Constitutional: Patient appears well-developed and well-nourished. Obese  No distress.  HEENT: head atraumatic, normocephalic, pupils equal and reactive to light, neck supple Cardiovascular: Normal rate, regular rhythm and normal heart sounds.  No murmur heard. No BLE edema. Pulmonary/Chest: Effort normal and breath sounds normal. No respiratory distress. Abdominal: Soft.  There is no tenderness. Muscular skeletal: tender during palpation of lumbar spine  Psychiatric: Patient has a normal mood and affect. behavior is normal. Judgment and thought  content normal.   Recent Results (from the past 2160 hour(s))  Basic metabolic panel     Status: Abnormal   Collection Time: 09/26/21 10:23 AM  Result Value Ref Range   Sodium 140 135 - 145 mmol/L   Potassium 3.9 3.5 - 5.1 mmol/L   Chloride 108 98 - 111 mmol/L   CO2 25 22 - 32 mmol/L   Glucose, Bld 169 (H) 70 - 99 mg/dL    Comment: Glucose reference range applies only to samples taken after fasting for at  least 8 hours.   BUN 16 6 - 20 mg/dL   Creatinine, Ser 0.65 0.44 - 1.00 mg/dL   Calcium 9.9 8.9 - 10.3 mg/dL   GFR, Estimated >60 >60 mL/min    Comment: (NOTE) Calculated using the CKD-EPI Creatinine Equation (2021)    Anion gap 7 5 - 15    Comment: Performed at [facility], [address] [place], [place] [address]  CBC     Status: None   Collection Time: 09/26/21 10:23 AM  Result Value Ref Range   WBC 8.0 4.0 - 10.5 K/uL   RBC 4.63 3.87 - 5.11 MIL/uL   Hemoglobin 14.1 12.0 - 15.0 g/dL   HCT 42.6 36.0 - 46.0 %   MCV 92.0 80.0 - 100.0 fL   MCH 30.5 26.0 - 34.0 pg   MCHC 33.1 30.0 - 36.0 g/dL   RDW 14.3 11.5 - 15.5 %   Platelets 335 150 - 400 K/uL   nRBC 0.0 0.0 - 0.2 %    Comment: Performed at [facility], [place], [place] [address]  Troponin I (High Sensitivity)     Status: None   Collection Time: 09/26/21 10:23 AM  Result Value Ref Range   Troponin I (High Sensitivity) 3 <18 ng/L    Comment: (NOTE) Elevated high sensitivity troponin I (hsTnI) values and significant  changes across serial measurements may suggest ACS but many other  chronic and acute conditions are known to elevate hsTnI results.  Refer to the "Links" section for chest pain algorithms and additional  guidance. Performed at [facility], [address] [place], [place] [address]     PHQ2/9:    11/26/2021    8:39 AM 10/09/2021    2:27 PM 09/17/2021    1:48 PM 08/26/2021    8:10 AM 07/24/2021    8:02 AM  Depression screen PHQ 2/9  Decreased  Interest 0 0 0 0 0  Down, Depressed, Hopeless 0 0 0 0 0  PHQ - 2 Score 0 0 0 0 0  Altered sleeping 0  0 0 0  Tired, decreased energy 0  2 0 0  Change in appetite 0  0 0 0  Feeling bad or failure about yourself  0  0 0 0  Trouble concentrating 0  0 0 0  Moving slowly or fidgety/restless 0  0 0 0  Suicidal thoughts 0  0 0 0  PHQ-9 Score 0  2 0 0  Difficult doing work/chores   Not difficult at all      phq 9 is negative   Fall Risk:    11/26/2021    8:38 AM 10/09/2021    2:27 PM 09/17/2021    1:48 PM 08/26/2021    8:09 AM 07/24/2021    8:01 AM  Fall Risk   Falls in the past year? 0 0 0 0 0  Number falls in past yr:    0 0  Injury with Fall?    0 0  Risk for fall due to :   No Fall Risks No Fall Risks No Fall Risks  Follow up Falls prevention discussed;Education provided;Falls evaluation completed  Falls prevention discussed Falls prevention discussed Falls prevention discussed      Functional Status Survey: Is the patient deaf or have difficulty hearing?: No Does the patient have difficulty seeing, even when wearing glasses/contacts?: No Does the patient have difficulty concentrating, remembering, or making decisions?: No Does the patient have difficulty walking or  climbing stairs?: No Does the patient have difficulty dressing or bathing?: No Does the patient have difficulty doing errands alone such as visiting a doctor's office or shopping?: No    Assessment & Plan  1. Centrilobular emphysema ([place])  Cannot afford inhaler   2. Recurrent major depressive disorder, in remission ([place])  Advised to take wellbutrin, monitor bp, it may help with seasonal affective disorder and tobacco cessation  3. Vitamin D deficiency  Continue supplementation   4. Vitamin B12 deficiency  Continue supplementation a few times a week  5. DDD (degenerative disc disease), lumbar  Keep follow up with pain clinic   6. Essential hypertension  At goal after rest   7. Seronegative  inflammatory arthritis  She needs to go back to rheumatologist   8. Perennial allergic rhinitis   9. Pure hypercholesterolemia  Continue statin therapy   10. Palpitation  Seen by cardiologist   11. Anxiety with flying  - hydrOXYzine (ATARAX) 10 MG tablet; Take 1-2 tablets (10-20 mg total) by mouth 3 (three) times daily as needed.  Dispense: 30 tablet; Refill: 0

## 2021-11-26 ENCOUNTER — Ambulatory Visit (INDEPENDENT_AMBULATORY_CARE_PROVIDER_SITE_OTHER): Payer: No Typology Code available for payment source | Admitting: Family Medicine

## 2021-11-26 ENCOUNTER — Encounter: Payer: Self-pay | Admitting: Family Medicine

## 2021-11-26 VITALS — BP 130/70 | HR 63 | Temp 98.1°F | Resp 16 | Ht 62.0 in | Wt 175.9 lb

## 2021-11-26 DIAGNOSIS — E538 Deficiency of other specified B group vitamins: Secondary | ICD-10-CM

## 2021-11-26 DIAGNOSIS — J432 Centrilobular emphysema: Secondary | ICD-10-CM

## 2021-11-26 DIAGNOSIS — E559 Vitamin D deficiency, unspecified: Secondary | ICD-10-CM | POA: Diagnosis not present

## 2021-11-26 DIAGNOSIS — M1380 Other specified arthritis, unspecified site: Secondary | ICD-10-CM

## 2021-11-26 DIAGNOSIS — M51369 Other intervertebral disc degeneration, lumbar region without mention of lumbar back pain or lower extremity pain: Secondary | ICD-10-CM

## 2021-11-26 DIAGNOSIS — R002 Palpitations: Secondary | ICD-10-CM

## 2021-11-26 DIAGNOSIS — I10 Essential (primary) hypertension: Secondary | ICD-10-CM

## 2021-11-26 DIAGNOSIS — F40243 Fear of flying: Secondary | ICD-10-CM

## 2021-11-26 DIAGNOSIS — M5136 Other intervertebral disc degeneration, lumbar region: Secondary | ICD-10-CM

## 2021-11-26 DIAGNOSIS — J3089 Other allergic rhinitis: Secondary | ICD-10-CM

## 2021-11-26 DIAGNOSIS — E7800 Pure hypercholesterolemia, unspecified: Secondary | ICD-10-CM

## 2021-11-26 DIAGNOSIS — F3340 Major depressive disorder, recurrent, in remission, unspecified: Secondary | ICD-10-CM | POA: Diagnosis not present

## 2021-11-26 MED ORDER — HYDROXYZINE HCL 10 MG PO TABS: 10.0000 mg | ORAL_TABLET | Freq: Three times a day (TID) | ORAL | 0 refills | Status: DC | PRN

## 2021-12-09 ENCOUNTER — Ambulatory Visit: Payer: No Typology Code available for payment source

## 2021-12-09 DIAGNOSIS — M5459 Other low back pain: Secondary | ICD-10-CM

## 2021-12-09 DIAGNOSIS — M6281 Muscle weakness (generalized): Secondary | ICD-10-CM

## 2021-12-09 DIAGNOSIS — G8929 Other chronic pain: Secondary | ICD-10-CM

## 2021-12-09 DIAGNOSIS — R262 Difficulty in walking, not elsewhere classified: Secondary | ICD-10-CM

## 2021-12-09 NOTE — Therapy (Signed)
OUTPATIENT PHYSICAL THERAPY TREATMENT NOTE   Patient Name: Nichole Wells MRN: [id number] DOB:05-Nov-1960, 61 y.o., female Today's Date: 12/09/2021  PCP: Steele Sizer, MD  REFERRING PROVIDER: Milinda Pointer, MD   PT End of Session - 12/09/21 1536     Visit Number 2    Number of Visits 17    Date for PT Re-Evaluation 01/09/22    Authorization Type 2    Authorization Time Period of 10 progress report    Authorization - Visit Number 2    Authorization - Number of Visits 120   per calendar year   PT Start Time 1536    PT Stop Time 1622    PT Time Calculation (min) 46 min    Activity Tolerance Patient tolerated treatment well    Behavior During Therapy WFL for tasks assessed/performed             Past Medical History:  Diagnosis Date   Anxiety    Arthritis    poss ra   GERD (gastroesophageal reflux disease)    occ   Heart murmur    Hypertension    Inflammatory arthritis    Vitamin D deficiency    Past Surgical History:  Procedure Laterality Date   BREAST BIOPSY Left    stereo- neg   BREAST CYST ASPIRATION Right    neg   BREAST SURGERY     benign biopsy   CHOLECYSTECTOMY     COLONOSCOPY WITH PROPOFOL N/A 10/11/2019   Procedure: COLONOSCOPY WITH PROPOFOL;  Surgeon: [name], MD;  Location: [facility] ENDOSCOPY;  Service: Gastroenterology;  Laterality: N/A;   COLONOSCOPY WITH PROPOFOL N/A 06/26/2021   Procedure: COLONOSCOPY WITH PROPOFOL;  Surgeon: [name], MD;  Location: [facility] ENDOSCOPY;  Service: Gastroenterology;  Laterality: N/A;   DILATION AND CURETTAGE OF UTERUS     LAPAROSCOPIC LYSIS OF ADHESIONS  12/10/2015   Procedure: LAPAROSCOPIC LYSIS OF ADHESIONS;  Surgeon: Brayton Mars, MD;  Location: [facility] ORS;  Service: Gynecology;;   LAPAROSCOPIC VAGINAL HYSTERECTOMY WITH SALPINGO OOPHORECTOMY Bilateral 01/28/2016   Procedure: LAPAROSCOPIC ASSISTED VAGINAL HYSTERECTOMY WITH SALPINGO OOPHORECTOMY;  Surgeon: Brayton Mars, MD;  Location: [facility] ORS;   Service: Gynecology;  Laterality: Bilateral;   LAPAROSCOPY N/A 12/10/2015   Procedure: LAPAROSCOPY DIAGNOSTIC WITH BIOPSIES;  Surgeon: Brayton Mars, MD;  Location: [facility] ORS;  Service: Gynecology;  Laterality: N/A;   SHOULDER ARTHROSCOPY Right    TUBAL LIGATION     UTERINE FIBROID EMBOLIZATION     Patient Active Problem List   Diagnosis Date Noted   Heart palpitations 10/23/2021   Lumbosacral facet hypertrophy ( L3-4, L4-5, and L5-S1) (Bilateral) 10/08/2021    Class: Chronic   Perennial allergic rhinitis 08/26/2021   Essential hypertension 07/24/2021   Vitamin B12 deficiency 07/24/2021   Abnormal MRI, lumbar spine (06/03/2014 & 9/9 /2023) 06/13/2021   Low back pain of over 3 months duration 05/16/2021   Spondylosis without myelopathy or radiculopathy, lumbosacral region 05/16/2021   DDD (degenerative disc disease), cervical (Multilevel) 01/14/2021   Cervical facet arthropathy (Multilevel) (Bilateral) 01/14/2021   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 01/14/2021   Retrolisthesis of lumbar spine at L1/L2 and L2/L3 01/14/2021   Lumbosacral facet arthropathy (Multilevel) (Bilateral) 01/14/2021   Osteoarthritis of hips (Bilateral) 01/14/2021   Osteoarthritis of wrist (Right) 01/14/2021   Osteoarthritis of first carpometacarpal joint of hand (Right) 01/14/2021   Chronic pain syndrome 11/14/2020   Disorder of skeletal system 11/14/2020   Problems influencing health status 11/14/2020   Chronic low  back pain (1ry area of Pain) (Bilateral) (L>R) w/o sciatica 11/14/2020    Class: Chronic   Lumbosacral facet syndrome (Bilateral) 11/14/2020   Chronic wrist pain (2ry area of Pain) (Right) 11/14/2020   History of carpal tunnel surgery of wrist (Right) 11/14/2020   Chronic neck pain (3ry area of Pain) (Bilateral) (L>R) 11/14/2020   Cervicalgia 11/14/2020   Cervical facet syndrome 11/14/2020   Chronic hip pain (4th area of Pain) (Bilateral) 11/14/2020   Recurrent major  depressive disorder, in remission ([place]) 02/18/2018   Thoracic aorta atherosclerosis ([place]) 11/10/2017   Centrilobular emphysema ([place]) 11/10/2017   Abnormal liver CT 11/10/2017   Seronegative inflammatory arthritis 05/07/2017   Tobacco use 05/07/2017   Vitamin D deficiency 05/28/2016   Surgical menopause on hormone replacement therapy 04/23/2016   Status post laparoscopic assisted vaginal hysterectomy (LAVH) 01/28/2016   Family history of ovarian cancer 11/13/2015   Insomnia 03/27/2015   Uncontrolled hypertension 08/30/2014   Anxiety and depression 08/30/2014   Cardiac murmur 08/30/2014   HLD (hyperlipidemia) 08/30/2014   DDD (degenerative disc disease), lumbar 07/26/2014    REFERRING DIAG: M54.50 (ICD-10-CM) - Low back pain of over 3 months duration  M47.817 (ICD-10-CM) - Lumbosacral facet joint syndrome  M43.10 (ICD-10-CM) - Retrolisthesis  M54.50,G89.29 (ICD-10-CM) - Chronic bilateral low back pain without sciatica  M47.817 (ICD-10-CM) - Facet arthropathy, lumbosacral  M47.817 (ICD-10-CM) - Spondylosis without myelopathy or radiculopathy, lumbosacral region  R93.7 (ICD-10-CM) - Abnormal MRI, lumbar spine  M47.817 (ICD-10-CM) - Facet hypertrophy of lumbosacral region  THERAPY DIAG:  Other low back pain  Chronic left-sided low back pain with left-sided sciatica  Difficulty in walking, not elsewhere classified  Muscle weakness (generalized)  Rationale for Evaluation and Treatment Rehabilitation  PERTINENT HISTORY: Low back pain. Predominantly L Low back. pain only went down L LE only 2x for the past couple of months but went away quickly (posterior hip and thigh along the sciatic pathway L side). Pain began several years ago, gradual onset. Back currently hurts all the time. Tried PT several years ago for her back which did not help (did LTR, naval ins, S/L hip abduction, e-stim; no manual therapy).  PRECAUTIONS: osteoporosis  SUBJECTIVE:   SUBJECTIVE STATEMENT: Stiff after the  plan ride. Was at [place] for her daughter's wedding.   PAIN:  Are you having pain? 6/10 low back pain currently in sitting and walking.    TODAY'S TREATMENT:                                                                                                                              DATE:  12/09/2021    Decreased low back pain with L lateral shift correction  Slump test: (+) L LE with reproduction of sciatic symptoms, (+) R LE with posterior hip and proximal thigh symptoms.     Therapeutic exercise  Seated manually resisted R lateral shift isometrics in neutral to correct posture, back resting on chair back rest  10x5 seconds for 2 sets  Seated trunk flexion stretch 20 seconds for 3   Seated hip hinging 10x, then 5x  Reclined  Hooklying    posterior pelvic tilt 10x5 seconds for 2 sets    Crunches 10x     L low back pain, eases with rest    B shoulder extension isometrics, hands on table 10x5 seconds for 2 sets    R trunk side bend position 30 seconds, then 2 minute to decrease L low back pressure      Seated L trunk rotation 10x5 seconds  Felt good initially, then increased low back pain afterwards   Seated trunk flexion  1 minute   Decreased central low back pain while in the flexed position.    Improved exercise technique, movement at target joints, use of target muscles after mod verbal, visual, tactile cues.     Manual therapy  Seated STM B lumbar paraspinal muscles to improve fascial mobility and decrease muscle tension       Response to treatment Fair tolerance to today's session.    Clinical impression  Demonstrates hinging at the thoracolumbar area when returning from the forward flexion position. Worked on trunk strengthening and hip hinging to help address. Also worked on posture, and decreasing soft tissue restrictions to low back. Fair tolerance to today's session. Pt will benefit from continued skilled physical therapy services to decrease  pain, improve strength and function.       PATIENT EDUCATION: Education details: ther-ex, HEP Person educated: Patient Education method: Explanation, Demonstration, Tactile cues, Verbal cues, and Handouts Education comprehension: verbalized understanding and returned demonstration  HOME EXERCISE PROGRAM: Access Code: [email or web address] URL: [email or web address] Date: 11/13/2021 Prepared by: Joneen Boers  Exercises - Seated Transversus Abdominis Bracing  - 5 x daily - 7 x weekly - 3 sets - 10 reps - 5 seconds hold   PT Short Term Goals - 11/13/21 1539       PT SHORT TERM GOAL #1   Title Pt will be independent with her initial HEP to decrease pain, improve strength and function.    Baseline Pt has started her initial HEP (11/13/2021)    Time 3    Period Weeks    Status New    Target Date 12/05/21              PT Long Term Goals - 11/13/21 1541       PT LONG TERM GOAL #1   Title Pt will have a decrease in low back pain to 4/10 or less at worst to promote ability to move around, stand, sit, bend over, ambulate, negotiate stairs more comfortably.    Baseline 10/10 low back pain at worst for the past 3 months (11/13/2021)    Time 8    Period Weeks    Status New    Target Date 01/09/22      PT LONG TERM GOAL #2   Title Pt will improve her lumbar FOTO score by at least 10 points as a demonstration of improved function.    Baseline Lumbar Spine FOTO 53 (11/13/2021)    Time 8    Period Weeks    Status New    Target Date 01/09/22      PT LONG TERM GOAL #3   Title Pt will improve bilateral hip extension and abduction strength by at least 1/2 MMT grade to promote ability to ambulate, perform standing tasks, negotiate stairs with less pain.    Baseline Hip extension 3+/5 R,  4-/5 L, hip abduction 4/5 R and L (11/13/2021)    Time 8    Period Weeks    Status New    Target Date 01/09/22              Plan - 12/09/21 1628     Clinical Impression Statement  Demonstrates hinging at the thoracolumbar area when returning from the forward flexion position. Worked on trunk strengthening and hip hinging to help address. Also worked on posture, and decreasing soft tissue restrictions to low back. Fair tolerance to today's session. Pt will benefit from continued skilled physical therapy services to decrease pain, improve strength and function.    Personal Factors and Comorbidities Comorbidity 3+;Past/Current Experience;Profession;Time since onset of injury/illness/exacerbation    Comorbidities Anxiety, arthritis, HTN, inflammatory arthritis    Examination-Activity Limitations Stairs;Stand;Bend;Sit;Lift;Carry;Transfers;Locomotion Level    Stability/Clinical Decision Making Stable/Uncomplicated    Rehab Potential Fair    PT Frequency 2x / week    PT Duration 8 weeks    PT Treatment/Interventions Therapeutic exercise;Therapeutic activities;Neuromuscular re-education;Patient/family education;Manual techniques;Dry needling;Aquatic Therapy;Electrical Stimulation;Iontophoresis '4mg'$ /ml Dexamethasone    PT Next Visit Plan posture, trunk and glute strengthening, lumbopelvic control, manual techniques, modalities PRN    PT Home Exercise Plan Medbridge Access Code: [email or web address]    Consulted and Agree with Plan of Care Patient              Joneen Boers PT, DPT  12/09/2021, 4:31 PM

## 2021-12-12 ENCOUNTER — Ambulatory Visit: Payer: No Typology Code available for payment source | Attending: Pain Medicine

## 2021-12-12 DIAGNOSIS — G8929 Other chronic pain: Secondary | ICD-10-CM | POA: Diagnosis present

## 2021-12-12 DIAGNOSIS — M5459 Other low back pain: Secondary | ICD-10-CM | POA: Insufficient documentation

## 2021-12-12 DIAGNOSIS — R262 Difficulty in walking, not elsewhere classified: Secondary | ICD-10-CM | POA: Diagnosis present

## 2021-12-12 DIAGNOSIS — M5442 Lumbago with sciatica, left side: Secondary | ICD-10-CM | POA: Diagnosis present

## 2021-12-12 DIAGNOSIS — M6281 Muscle weakness (generalized): Secondary | ICD-10-CM | POA: Insufficient documentation

## 2021-12-12 NOTE — Therapy (Signed)
OUTPATIENT PHYSICAL THERAPY TREATMENT NOTE   Patient Name: Nichole Wells MRN: [id number] DOB:[birth year], 61 y.o., female Today's Date: 12/12/2021  PCP: Steele Sizer, MD  REFERRING PROVIDER: Milinda Pointer, MD   PT End of Session - 12/12/21 1320     Visit Number 3    Number of Visits 17    Date for PT Re-Evaluation 01/09/22    Authorization Type 3    Authorization Time Period of 10 progress report    Authorization - Visit Number 3    Authorization - Number of Visits 120   per calendar year   PT Start Time 1325    PT Stop Time 1410    PT Time Calculation (min) 45 min    Activity Tolerance Patient tolerated treatment well    Behavior During Therapy WFL for tasks assessed/performed             Past Medical History:  Diagnosis Date   Anxiety    Arthritis    poss ra   GERD (gastroesophageal reflux disease)    occ   Heart murmur    Hypertension    Inflammatory arthritis    Vitamin D deficiency    Past Surgical History:  Procedure Laterality Date   BREAST BIOPSY Left    stereo- neg   BREAST CYST ASPIRATION Right    neg   BREAST SURGERY     benign biopsy   CHOLECYSTECTOMY     COLONOSCOPY WITH PROPOFOL N/A 10/11/2019   Procedure: COLONOSCOPY WITH PROPOFOL;  Surgeon: [name], MD;  Location: [facility] ENDOSCOPY;  Service: Gastroenterology;  Laterality: N/A;   COLONOSCOPY WITH PROPOFOL N/A 06/26/2021   Procedure: COLONOSCOPY WITH PROPOFOL;  Surgeon: [name], MD;  Location: [facility] ENDOSCOPY;  Service: Gastroenterology;  Laterality: N/A;   DILATION AND CURETTAGE OF UTERUS     LAPAROSCOPIC LYSIS OF ADHESIONS  12/10/2015   Procedure: LAPAROSCOPIC LYSIS OF ADHESIONS;  Surgeon: Brayton Mars, MD;  Location: [facility] ORS;  Service: Gynecology;;   LAPAROSCOPIC VAGINAL HYSTERECTOMY WITH SALPINGO OOPHORECTOMY Bilateral 01/28/2016   Procedure: LAPAROSCOPIC ASSISTED VAGINAL HYSTERECTOMY WITH SALPINGO OOPHORECTOMY;  Surgeon: Brayton Mars, MD;  Location: [facility] ORS;   Service: Gynecology;  Laterality: Bilateral;   LAPAROSCOPY N/A 12/10/2015   Procedure: LAPAROSCOPY DIAGNOSTIC WITH BIOPSIES;  Surgeon: Brayton Mars, MD;  Location: [facility] ORS;  Service: Gynecology;  Laterality: N/A;   SHOULDER ARTHROSCOPY Right    TUBAL LIGATION     UTERINE FIBROID EMBOLIZATION     Patient Active Problem List   Diagnosis Date Noted   Heart palpitations 10/23/2021   Lumbosacral facet hypertrophy ( L3-4, L4-5, and L5-S1) (Bilateral) 10/08/2021    Class: Chronic   Perennial allergic rhinitis 08/26/2021   Essential hypertension 07/24/2021   Vitamin B12 deficiency 07/24/2021   Abnormal MRI, lumbar spine (06/03/2014 & 9/9 /2023) 06/13/2021   Low back pain of over 3 months duration 05/16/2021   Spondylosis without myelopathy or radiculopathy, lumbosacral region 05/16/2021   DDD (degenerative disc disease), cervical (Multilevel) 01/14/2021   Cervical facet arthropathy (Multilevel) (Bilateral) 01/14/2021   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 01/14/2021   Retrolisthesis of lumbar spine at L1/L2 and L2/L3 01/14/2021   Lumbosacral facet arthropathy (Multilevel) (Bilateral) 01/14/2021   Osteoarthritis of hips (Bilateral) 01/14/2021   Osteoarthritis of wrist (Right) 01/14/2021   Osteoarthritis of first carpometacarpal joint of hand (Right) 01/14/2021   Chronic pain syndrome 11/14/2020   Disorder of skeletal system 11/14/2020   Problems influencing health status 11/14/2020   Chronic low  back pain (1ry area of Pain) (Bilateral) (L>R) w/o sciatica 11/14/2020    Class: Chronic   Lumbosacral facet syndrome (Bilateral) 11/14/2020   Chronic wrist pain (2ry area of Pain) (Right) 11/14/2020   History of carpal tunnel surgery of wrist (Right) 11/14/2020   Chronic neck pain (3ry area of Pain) (Bilateral) (L>R) 11/14/2020   Cervicalgia 11/14/2020   Cervical facet syndrome 11/14/2020   Chronic hip pain (4th area of Pain) (Bilateral) 11/14/2020   Recurrent major  depressive disorder, in remission ([place]) 02/18/2018   Thoracic aorta atherosclerosis ([place]) 11/10/2017   Centrilobular emphysema ([place]) 11/10/2017   Abnormal liver CT 11/10/2017   Seronegative inflammatory arthritis 05/07/2017   Tobacco use 05/07/2017   Vitamin D deficiency 05/28/2016   Surgical menopause on hormone replacement therapy 04/23/2016   Status post laparoscopic assisted vaginal hysterectomy (LAVH) 01/28/2016   Family history of ovarian cancer 11/13/2015   Insomnia 03/27/2015   Uncontrolled hypertension 08/30/2014   Anxiety and depression 08/30/2014   Cardiac murmur 08/30/2014   HLD (hyperlipidemia) 08/30/2014   DDD (degenerative disc disease), lumbar 07/26/2014    REFERRING DIAG: M54.50 (ICD-10-CM) - Low back pain of over 3 months duration  M47.817 (ICD-10-CM) - Lumbosacral facet joint syndrome  M43.10 (ICD-10-CM) - Retrolisthesis  M54.50,G89.29 (ICD-10-CM) - Chronic bilateral low back pain without sciatica  M47.817 (ICD-10-CM) - Facet arthropathy, lumbosacral  M47.817 (ICD-10-CM) - Spondylosis without myelopathy or radiculopathy, lumbosacral region  R93.7 (ICD-10-CM) - Abnormal MRI, lumbar spine  M47.817 (ICD-10-CM) - Facet hypertrophy of lumbosacral region  THERAPY DIAG:  Other low back pain  Chronic left-sided low back pain with left-sided sciatica  Difficulty in walking, not elsewhere classified  Muscle weakness (generalized)  Rationale for Evaluation and Treatment Rehabilitation  PERTINENT HISTORY: Low back pain. Predominantly L Low back. pain only went down L LE only 2x for the past couple of months but went away quickly (posterior hip and thigh along the sciatic pathway L side). Pain began several years ago, gradual onset. Back currently hurts all the time. Tried PT several years ago for her back which did not help (did LTR, naval ins, S/L hip abduction, e-stim; no manual therapy).  PRECAUTIONS: osteoporosis  SUBJECTIVE:   SUBJECTIVE STATEMENT: Pt reports 6/10 NPS  mainly L sided low back. Reports aggravation since the cold weather.   PAIN:  Are you having pain? 6/10 low back pain currently in sitting and walking.    TODAY'S TREATMENT:                                                                                                                              DATE:  12/12/2021   There.ex:  Seated physioball roll outs forwards for paraspinal stretch: x20, 5 sec holds. Trialed R/L deviations x5/direction     Standing R lateral shift correction with R shoulder on wall: x8    Hook lying exercise with MH provided to low back:   Glut bridge: 3x8, PT demo for post  tilt + bridge to reduce lumbar facet pain.    Lumbar trunk rotations: x20/side    Posterior pelvic tilt: x20   B knees to chest to promote lumbar flexion with towel under posterior thighs for leverage: x12, 2-3 sec holds.      Seated paloff press: RTB, 2x8/side. Reports increased L sided LBP with L side.       Seated on dynadisc:    Alt marches: 3x8/LE.    Response to treatment Fair tolerance to today's session.    Clinical impression Continuing PT POC with promoting lumbar mobility with use of MH modality for pain modulation and improving tissue extensibility. Gentle posterior chain and core strengthening exercises performed. Some minor aggravation of pain with L sided paloff press anti rotation. Overall though pain remains the same. Encouraged to trial some exercises performed this date to promote lumbar mobility. Some pain improvement post session due to [place]. Pt will continue to benefit from skilled PT services to address pain and strength deficits to optimize return to PLOF.     PATIENT EDUCATION: Education details: ther-ex, HEP Person educated: Patient Education method: Explanation, Demonstration, Tactile cues, Verbal cues, and Handouts Education comprehension: verbalized understanding and returned demonstration  HOME EXERCISE PROGRAM: Access Code: [email or web address] URL:  [email or web address] Date: 11/13/2021 Prepared by: Joneen Boers  Exercises - Seated Transversus Abdominis Bracing  - 5 x daily - 7 x weekly - 3 sets - 10 reps - 5 seconds hold   PT Short Term Goals - 11/13/21 1539       PT SHORT TERM GOAL #1   Title Pt will be independent with her initial HEP to decrease pain, improve strength and function.    Baseline Pt has started her initial HEP (11/13/2021)    Time 3    Period Weeks    Status New    Target Date 12/05/21              PT Long Term Goals - 11/13/21 1541       PT LONG TERM GOAL #1   Title Pt will have a decrease in low back pain to 4/10 or less at worst to promote ability to move around, stand, sit, bend over, ambulate, negotiate stairs more comfortably.    Baseline 10/10 low back pain at worst for the past 3 months (11/13/2021)    Time 8    Period Weeks    Status New    Target Date 01/09/22      PT LONG TERM GOAL #2   Title Pt will improve her lumbar FOTO score by at least 10 points as a demonstration of improved function.    Baseline Lumbar Spine FOTO 53 (11/13/2021)    Time 8    Period Weeks    Status New    Target Date 01/09/22      PT LONG TERM GOAL #3   Title Pt will improve bilateral hip extension and abduction strength by at least 1/2 MMT grade to promote ability to ambulate, perform standing tasks, negotiate stairs with less pain.    Baseline Hip extension 3+/5 R, 4-/5 L, hip abduction 4/5 R and L (11/13/2021)    Time 8    Period Weeks    Status New    Target Date 01/09/22                 Salem Caster. Fairly IV, PT, DPT Physical Therapist- [facility]  12/12/2021, 2:31 PM

## 2021-12-16 ENCOUNTER — Ambulatory Visit: Payer: No Typology Code available for payment source

## 2021-12-16 DIAGNOSIS — M5459 Other low back pain: Secondary | ICD-10-CM

## 2021-12-16 DIAGNOSIS — M6281 Muscle weakness (generalized): Secondary | ICD-10-CM

## 2021-12-16 DIAGNOSIS — G8929 Other chronic pain: Secondary | ICD-10-CM

## 2021-12-16 DIAGNOSIS — R262 Difficulty in walking, not elsewhere classified: Secondary | ICD-10-CM

## 2021-12-16 NOTE — Therapy (Signed)
OUTPATIENT PHYSICAL THERAPY TREATMENT NOTE   Patient Name: Nichole Wells MRN: [id number] DOB:January 06, 1961, 61 y.o., female Today's Date: 12/16/2021  PCP: Steele Sizer, MD  REFERRING PROVIDER: Milinda Pointer, MD   PT End of Session - 12/16/21 1332     Visit Number 4    Number of Visits 17    Date for PT Re-Evaluation 01/09/22    Authorization Type 4    Authorization Time Period of 10 progress report    Authorization - Visit Number 4    Authorization - Number of Visits 120   per calendar year   PT Start Time 1333    PT Stop Time 1416    PT Time Calculation (min) 43 min    Activity Tolerance Patient tolerated treatment well    Behavior During Therapy WFL for tasks assessed/performed              Past Medical History:  Diagnosis Date   Anxiety    Arthritis    poss ra   GERD (gastroesophageal reflux disease)    occ   Heart murmur    Hypertension    Inflammatory arthritis    Vitamin D deficiency    Past Surgical History:  Procedure Laterality Date   BREAST BIOPSY Left    stereo- neg   BREAST CYST ASPIRATION Right    neg   BREAST SURGERY     benign biopsy   CHOLECYSTECTOMY     COLONOSCOPY WITH PROPOFOL N/A 10/11/2019   Procedure: COLONOSCOPY WITH PROPOFOL;  Surgeon: [name], MD;  Location: [facility] ENDOSCOPY;  Service: Gastroenterology;  Laterality: N/A;   COLONOSCOPY WITH PROPOFOL N/A 06/26/2021   Procedure: COLONOSCOPY WITH PROPOFOL;  Surgeon: [name], MD;  Location: [facility] ENDOSCOPY;  Service: Gastroenterology;  Laterality: N/A;   DILATION AND CURETTAGE OF UTERUS     LAPAROSCOPIC LYSIS OF ADHESIONS  12/10/2015   Procedure: LAPAROSCOPIC LYSIS OF ADHESIONS;  Surgeon: Brayton Mars, MD;  Location: [facility] ORS;  Service: Gynecology;;   LAPAROSCOPIC VAGINAL HYSTERECTOMY WITH SALPINGO OOPHORECTOMY Bilateral 01/28/2016   Procedure: LAPAROSCOPIC ASSISTED VAGINAL HYSTERECTOMY WITH SALPINGO OOPHORECTOMY;  Surgeon: Brayton Mars, MD;  Location: [facility] ORS;   Service: Gynecology;  Laterality: Bilateral;   LAPAROSCOPY N/A 12/10/2015   Procedure: LAPAROSCOPY DIAGNOSTIC WITH BIOPSIES;  Surgeon: Brayton Mars, MD;  Location: [facility] ORS;  Service: Gynecology;  Laterality: N/A;   SHOULDER ARTHROSCOPY Right    TUBAL LIGATION     UTERINE FIBROID EMBOLIZATION     Patient Active Problem List   Diagnosis Date Noted   Heart palpitations 10/23/2021   Lumbosacral facet hypertrophy ( L3-4, L4-5, and L5-S1) (Bilateral) 10/08/2021    Class: Chronic   Perennial allergic rhinitis 08/26/2021   Essential hypertension 07/24/2021   Vitamin B12 deficiency 07/24/2021   Abnormal MRI, lumbar spine (06/03/2014 & 9/9 /2023) 06/13/2021   Low back pain of over 3 months duration 05/16/2021   Spondylosis without myelopathy or radiculopathy, lumbosacral region 05/16/2021   DDD (degenerative disc disease), cervical (Multilevel) 01/14/2021   Cervical facet arthropathy (Multilevel) (Bilateral) 01/14/2021   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 01/14/2021   Retrolisthesis of lumbar spine at L1/L2 and L2/L3 01/14/2021   Lumbosacral facet arthropathy (Multilevel) (Bilateral) 01/14/2021   Osteoarthritis of hips (Bilateral) 01/14/2021   Osteoarthritis of wrist (Right) 01/14/2021   Osteoarthritis of first carpometacarpal joint of hand (Right) 01/14/2021   Chronic pain syndrome 11/14/2020   Disorder of skeletal system 11/14/2020   Problems influencing health status 11/14/2020   Chronic  low back pain (1ry area of Pain) (Bilateral) (L>R) w/o sciatica 11/14/2020    Class: Chronic   Lumbosacral facet syndrome (Bilateral) 11/14/2020   Chronic wrist pain (2ry area of Pain) (Right) 11/14/2020   History of carpal tunnel surgery of wrist (Right) 11/14/2020   Chronic neck pain (3ry area of Pain) (Bilateral) (L>R) 11/14/2020   Cervicalgia 11/14/2020   Cervical facet syndrome 11/14/2020   Chronic hip pain (4th area of Pain) (Bilateral) 11/14/2020   Recurrent major  depressive disorder, in remission ([place]) 02/18/2018   Thoracic aorta atherosclerosis ([place]) 11/10/2017   Centrilobular emphysema ([place]) 11/10/2017   Abnormal liver CT 11/10/2017   Seronegative inflammatory arthritis 05/07/2017   Tobacco use 05/07/2017   Vitamin D deficiency 05/28/2016   Surgical menopause on hormone replacement therapy 04/23/2016   Status post laparoscopic assisted vaginal hysterectomy (LAVH) 01/28/2016   Family history of ovarian cancer 11/13/2015   Insomnia 03/27/2015   Uncontrolled hypertension 08/30/2014   Anxiety and depression 08/30/2014   Cardiac murmur 08/30/2014   HLD (hyperlipidemia) 08/30/2014   DDD (degenerative disc disease), lumbar 07/26/2014    REFERRING DIAG: M54.50 (ICD-10-CM) - Low back pain of over 3 months duration  M47.817 (ICD-10-CM) - Lumbosacral facet joint syndrome  M43.10 (ICD-10-CM) - Retrolisthesis  M54.50,G89.29 (ICD-10-CM) - Chronic bilateral low back pain without sciatica  M47.817 (ICD-10-CM) - Facet arthropathy, lumbosacral  M47.817 (ICD-10-CM) - Spondylosis without myelopathy or radiculopathy, lumbosacral region  R93.7 (ICD-10-CM) - Abnormal MRI, lumbar spine  M47.817 (ICD-10-CM) - Facet hypertrophy of lumbosacral region  THERAPY DIAG:  Other low back pain  Chronic left-sided low back pain with left-sided sciatica  Difficulty in walking, not elsewhere classified  Muscle weakness (generalized)  Rationale for Evaluation and Treatment Rehabilitation  PERTINENT HISTORY: Low back pain. Predominantly L Low back. pain only went down L LE only 2x for the past couple of months but went away quickly (posterior hip and thigh along the sciatic pathway L side). Pain began several years ago, gradual onset. Back currently hurts all the time. Tried PT several years ago for her back which did not help (did LTR, naval ins, S/L hip abduction, e-stim; no manual therapy).  PRECAUTIONS: osteoporosis  SUBJECTIVE:   SUBJECTIVE STATEMENT: Back hurts, about a  7/10 today. Back started to bother her at night.  PAIN:  Are you having pain? 7/10 low back pain currently in sitting and walking.    TODAY'S TREATMENT:                                                                                                                              DATE:  12/16/2021    Decreased low back pain with L lateral shift correction  Slump test: (+) L LE with reproduction of sciatic symptoms, (+) R LE with posterior hip and proximal thigh symptoms.   Manual therapy Prone with 2 pillows under abdomen and one pillow under ankles  STM L lumbar paraspinal muscle to decrease tension  Therapeutic exercise Prone with 2 pillows under abdomen and one pillow under ankles   Transversus abdominis contraction 10x5 seconds for 3 sets   Prone glute max/quad set                 R 5x5 seconds                L 5x5 seconds.    Sitting with upright posture in neutral  Isometrics   R rotation 10x5 seconds for 2 sets   L rotation 10x5 seconds for 2 sets    Flexion 10x5 seconds for 2 sets   Extension 10x5 seconds for 2 sets    R side bend 10x5 seconds for 2 sets   L side bend 10x5 seconds for 2 sets    Fatigue afterwards.         Response to treatment Fair tolerance to today's session. Decreased back pain to 5/10 after session.    Clinical impression  Worked on trunk strengthening all planes to help decrease stress to low back as well as promote blood flow through muscle activation to help pump pain chemicals out. Also worked on decreasing L lumbar paraspinal  muscle tension to low back. Decreased low back pain to 5/10 after session. Pt will benefit from continued skilled physical therapy services to decrease pain, improve strength and function.       PATIENT EDUCATION: Education details: ther-ex, HEP Person educated: Patient Education method: Explanation, Demonstration, Tactile cues, Verbal cues, and Handouts Education comprehension: verbalized  understanding and returned demonstration  HOME EXERCISE PROGRAM: Access Code: [email or web address] URL: [email or web address] Date: 11/13/2021 Prepared by: Joneen Boers  Exercises - Seated Transversus Abdominis Bracing  - 5 x daily - 7 x weekly - 3 sets - 10 reps - 5 seconds hold   PT Short Term Goals - 11/13/21 1539       PT SHORT TERM GOAL #1   Title Pt will be independent with her initial HEP to decrease pain, improve strength and function.    Baseline Pt has started her initial HEP (11/13/2021)    Time 3    Period Weeks    Status New    Target Date 12/05/21              PT Long Term Goals - 11/13/21 1541       PT LONG TERM GOAL #1   Title Pt will have a decrease in low back pain to 4/10 or less at worst to promote ability to move around, stand, sit, bend over, ambulate, negotiate stairs more comfortably.    Baseline 10/10 low back pain at worst for the past 3 months (11/13/2021)    Time 8    Period Weeks    Status New    Target Date 01/09/22      PT LONG TERM GOAL #2   Title Pt will improve her lumbar FOTO score by at least 10 points as a demonstration of improved function.    Baseline Lumbar Spine FOTO 53 (11/13/2021)    Time 8    Period Weeks    Status New    Target Date 01/09/22      PT LONG TERM GOAL #3   Title Pt will improve bilateral hip extension and abduction strength by at least 1/2 MMT grade to promote ability to ambulate, perform standing tasks, negotiate stairs with less pain.    Baseline Hip extension 3+/5 R, 4-/5 L, hip abduction 4/5 R and L (11/13/2021)    Time  8    Period Weeks    Status New    Target Date 01/09/22              Plan - 12/16/21 1827     Clinical Impression Statement Worked on trunk strengthening all planes to help decrease stress to low back as well as promote blood flow through muscle activation to help pump pain chemicals out. Also worked on decreasing L lumbar paraspinal  muscle tension to low back. Decreased low  back pain to 5/10 after session. Pt will benefit from continued skilled physical therapy services to decrease pain, improve strength and function.    Personal Factors and Comorbidities Comorbidity 3+;Past/Current Experience;Profession;Time since onset of injury/illness/exacerbation    Comorbidities Anxiety, arthritis, HTN, inflammatory arthritis    Examination-Activity Limitations Stairs;Stand;Bend;Sit;Lift;Carry;Transfers;Locomotion Level    Stability/Clinical Decision Making Stable/Uncomplicated    Clinical Decision Making Low    Rehab Potential Fair    PT Frequency 2x / week    PT Duration 8 weeks    PT Treatment/Interventions Therapeutic exercise;Therapeutic activities;Neuromuscular re-education;Patient/family education;Manual techniques;Dry needling;Aquatic Therapy;Electrical Stimulation;Iontophoresis '4mg'$ /ml Dexamethasone    PT Next Visit Plan posture, trunk and glute strengthening, lumbopelvic control, manual techniques, modalities PRN    PT Home Exercise Plan Medbridge Access Code: [email or web address]    Consulted and Agree with Plan of Care Patient               Joneen Boers PT, DPT  12/16/2021, 6:27 PM

## 2021-12-18 ENCOUNTER — Ambulatory Visit: Payer: No Typology Code available for payment source

## 2021-12-18 DIAGNOSIS — M5459 Other low back pain: Secondary | ICD-10-CM

## 2021-12-18 DIAGNOSIS — G8929 Other chronic pain: Secondary | ICD-10-CM

## 2021-12-18 DIAGNOSIS — R262 Difficulty in walking, not elsewhere classified: Secondary | ICD-10-CM

## 2021-12-18 NOTE — Therapy (Signed)
OUTPATIENT PHYSICAL THERAPY TREATMENT NOTE   Patient Name: Nichole Wells MRN: [id number] DOB:1960/12/27, 61 y.o., female Today's Date: 12/18/2021  PCP: Steele Sizer, MD  REFERRING PROVIDER: Milinda Pointer, MD   PT End of Session - 12/18/21 1412     Visit Number 5    Number of Visits 17    Date for PT Re-Evaluation 01/09/22    Authorization Type 5    Authorization Time Period of 10 progress report    Authorization - Visit Number 5    Authorization - Number of Visits 120   per calendar year   PT Start Time 1412    PT Stop Time 1502    PT Time Calculation (min) 50 min    Activity Tolerance Patient tolerated treatment well    Behavior During Therapy WFL for tasks assessed/performed               Past Medical History:  Diagnosis Date   Anxiety    Arthritis    poss ra   GERD (gastroesophageal reflux disease)    occ   Heart murmur    Hypertension    Inflammatory arthritis    Vitamin D deficiency    Past Surgical History:  Procedure Laterality Date   BREAST BIOPSY Left    stereo- neg   BREAST CYST ASPIRATION Right    neg   BREAST SURGERY     benign biopsy   CHOLECYSTECTOMY     COLONOSCOPY WITH PROPOFOL N/A 10/11/2019   Procedure: COLONOSCOPY WITH PROPOFOL;  Surgeon: [name], MD;  Location: [facility] ENDOSCOPY;  Service: Gastroenterology;  Laterality: N/A;   COLONOSCOPY WITH PROPOFOL N/A 06/26/2021   Procedure: COLONOSCOPY WITH PROPOFOL;  Surgeon: [name], MD;  Location: [facility] ENDOSCOPY;  Service: Gastroenterology;  Laterality: N/A;   DILATION AND CURETTAGE OF UTERUS     LAPAROSCOPIC LYSIS OF ADHESIONS  12/10/2015   Procedure: LAPAROSCOPIC LYSIS OF ADHESIONS;  Surgeon: Brayton Mars, MD;  Location: [facility] ORS;  Service: Gynecology;;   LAPAROSCOPIC VAGINAL HYSTERECTOMY WITH SALPINGO OOPHORECTOMY Bilateral 01/28/2016   Procedure: LAPAROSCOPIC ASSISTED VAGINAL HYSTERECTOMY WITH SALPINGO OOPHORECTOMY;  Surgeon: Brayton Mars, MD;  Location: [facility]  ORS;  Service: Gynecology;  Laterality: Bilateral;   LAPAROSCOPY N/A 12/10/2015   Procedure: LAPAROSCOPY DIAGNOSTIC WITH BIOPSIES;  Surgeon: Brayton Mars, MD;  Location: [facility] ORS;  Service: Gynecology;  Laterality: N/A;   SHOULDER ARTHROSCOPY Right    TUBAL LIGATION     UTERINE FIBROID EMBOLIZATION     Patient Active Problem List   Diagnosis Date Noted   Heart palpitations 10/23/2021   Lumbosacral facet hypertrophy ( L3-4, L4-5, and L5-S1) (Bilateral) 10/08/2021    Class: Chronic   Perennial allergic rhinitis 08/26/2021   Essential hypertension 07/24/2021   Vitamin B12 deficiency 07/24/2021   Abnormal MRI, lumbar spine (06/03/2014 & 9/9 /2023) 06/13/2021   Low back pain of over 3 months duration 05/16/2021   Spondylosis without myelopathy or radiculopathy, lumbosacral region 05/16/2021   DDD (degenerative disc disease), cervical (Multilevel) 01/14/2021   Cervical facet arthropathy (Multilevel) (Bilateral) 01/14/2021   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 01/14/2021   Retrolisthesis of lumbar spine at L1/L2 and L2/L3 01/14/2021   Lumbosacral facet arthropathy (Multilevel) (Bilateral) 01/14/2021   Osteoarthritis of hips (Bilateral) 01/14/2021   Osteoarthritis of wrist (Right) 01/14/2021   Osteoarthritis of first carpometacarpal joint of hand (Right) 01/14/2021   Chronic pain syndrome 11/14/2020   Disorder of skeletal system 11/14/2020   Problems influencing health status 11/14/2020  Chronic low back pain (1ry area of Pain) (Bilateral) (L>R) w/o sciatica 11/14/2020    Class: Chronic   Lumbosacral facet syndrome (Bilateral) 11/14/2020   Chronic wrist pain (2ry area of Pain) (Right) 11/14/2020   History of carpal tunnel surgery of wrist (Right) 11/14/2020   Chronic neck pain (3ry area of Pain) (Bilateral) (L>R) 11/14/2020   Cervicalgia 11/14/2020   Cervical facet syndrome 11/14/2020   Chronic hip pain (4th area of Pain) (Bilateral) 11/14/2020   Recurrent  major depressive disorder, in remission ([place]) 02/18/2018   Thoracic aorta atherosclerosis ([place]) 11/10/2017   Centrilobular emphysema ([place]) 11/10/2017   Abnormal liver CT 11/10/2017   Seronegative inflammatory arthritis 05/07/2017   Tobacco use 05/07/2017   Vitamin D deficiency 05/28/2016   Surgical menopause on hormone replacement therapy 04/23/2016   Status post laparoscopic assisted vaginal hysterectomy (LAVH) 01/28/2016   Family history of ovarian cancer 11/13/2015   Insomnia 03/27/2015   Uncontrolled hypertension 08/30/2014   Anxiety and depression 08/30/2014   Cardiac murmur 08/30/2014   HLD (hyperlipidemia) 08/30/2014   DDD (degenerative disc disease), lumbar 07/26/2014    REFERRING DIAG: M54.50 (ICD-10-CM) - Low back pain of over 3 months duration  M47.817 (ICD-10-CM) - Lumbosacral facet joint syndrome  M43.10 (ICD-10-CM) - Retrolisthesis  M54.50,G89.29 (ICD-10-CM) - Chronic bilateral low back pain without sciatica  M47.817 (ICD-10-CM) - Facet arthropathy, lumbosacral  M47.817 (ICD-10-CM) - Spondylosis without myelopathy or radiculopathy, lumbosacral region  R93.7 (ICD-10-CM) - Abnormal MRI, lumbar spine  M47.817 (ICD-10-CM) - Facet hypertrophy of lumbosacral region  THERAPY DIAG:  Other low back pain  Chronic left-sided low back pain with left-sided sciatica  Difficulty in walking, not elsewhere classified  Rationale for Evaluation and Treatment Rehabilitation  PERTINENT HISTORY: Low back pain. Predominantly L Low back. pain only went down L LE only 2x for the past couple of months but went away quickly (posterior hip and thigh along the sciatic pathway L side). Pain began several years ago, gradual onset. Back currently hurts all the time. Tried PT several years ago for her back which did not help (did LTR, naval ins, S/L hip abduction, e-stim; no manual therapy).  PRECAUTIONS: osteoporosis  SUBJECTIVE:   SUBJECTIVE STATEMENT: Back is horrible, an 8/10 currently. Back felt  better after session but pain returned to normal levels an hour later. 9/10 at worst for the past 7 days. Has a benign cyst L low back area (around L L5/S1)   PAIN:  Are you having pain? See subjective.     TODAY'S TREATMENT:                                                                                                                              DATE:  12/18/2021    Decreased low back pain with L lateral shift correction  Slump test: (+) L LE with reproduction of sciatic symptoms, (+) R LE with posterior hip and proximal thigh symptoms.   Manual therapy  Seated on  massage chair:  STM L and R lumbar paraspinal muscle to decrease tension and fascial restrictions      Therapeutic exercise Seated on massage chair:   Transversus abdominis contraction 10x5 seconds for 3 sets    glute max set 10x5 seconds for 3 sets     Sitting with upright posture in neutral  Isometrics   R rotation 10x5 seconds for 2 sets   L rotation 10x5 seconds for 2 sets    Flexion 10x5 seconds for 2 sets   Extension 10x5 seconds for 2 sets    R side bend 10x5 seconds for 2 sets   L side bend 10x5 seconds for 2 sets    Fatigue during exercises     Standing gentle lumbar extension 2x5 seconds. Ok initially, discomfort at 2nd repetition Standing R lumbar side bend. Ok initially but discomfort after second set  Pt was recommended to prop one foot up on a stool then the other during her standing work days to decrease pressure to her low back. Pt verbalized understanding.      Response to treatment Fair tolerance to today's session. No change in back pain today.    Clinical impression   Continued working on improving trunk muscle strength and endurance. Rest breaks needed during trunk isometric exercises secondary to fatigue. Pt was recommended to activate her transversus abdominis and glute muscles throughout the day to improve endurance. Pt verbalized understanding. Pt was also recommended  to prop one foot up onto her stool while at work to decrease pressure to her low back. Pt verbalized understanding. Pt tolerated session well without aggravation of symptoms. No change in back pain today. Pt will benefit from continued skilled physical therapy services to decrease pain, improve strength and function.       PATIENT EDUCATION: Education details: ther-ex, HEP Person educated: Patient Education method: Explanation, Demonstration, Tactile cues, Verbal cues, and Handouts Education comprehension: verbalized understanding and returned demonstration  HOME EXERCISE PROGRAM: Access Code: [email or web address] URL: [email or web address] Date: 11/13/2021 Prepared by: Joneen Boers  Exercises - Seated Transversus Abdominis Bracing  - 5 x daily - 7 x weekly - 3 sets - 10 reps - 5 seconds hold - Standing Gluteal Sets  - 5 x daily - 7 x weekly - 3 sets - 10 reps - 5 seconds hold      PT Short Term Goals - 11/13/21 1539       PT SHORT TERM GOAL #1   Title Pt will be independent with her initial HEP to decrease pain, improve strength and function.    Baseline Pt has started her initial HEP (11/13/2021)    Time 3    Period Weeks    Status New    Target Date 12/05/21              PT Long Term Goals - 11/13/21 1541       PT LONG TERM GOAL #1   Title Pt will have a decrease in low back pain to 4/10 or less at worst to promote ability to move around, stand, sit, bend over, ambulate, negotiate stairs more comfortably.    Baseline 10/10 low back pain at worst for the past 3 months (11/13/2021)    Time 8    Period Weeks    Status New    Target Date 01/09/22      PT LONG TERM GOAL #2   Title Pt will improve her lumbar FOTO score by at least 10 points as a demonstration of  improved function.    Baseline Lumbar Spine FOTO 53 (11/13/2021)    Time 8    Period Weeks    Status New    Target Date 01/09/22      PT LONG TERM GOAL #3   Title Pt will improve bilateral hip  extension and abduction strength by at least 1/2 MMT grade to promote ability to ambulate, perform standing tasks, negotiate stairs with less pain.    Baseline Hip extension 3+/5 R, 4-/5 L, hip abduction 4/5 R and L (11/13/2021)    Time 8    Period Weeks    Status New    Target Date 01/09/22              Plan - 12/18/21 1403     Clinical Impression Statement Continued working on improving trunk muscle strength and endurance. Rest breaks needed during trunk isometric exercises secondary to fatigue. Pt was recommended to activate her transversus abdominis and glute muscles throughout the day to improve endurance. Pt verbalized understanding. Pt was also recommended to prop one foot up onto her stool while at work to decrease pressure to her low back. Pt verbalized understanding. Pt tolerated session well without aggravation of symptoms. No change in back pain today. Pt will benefit from continued skilled physical therapy services to decrease pain, improve strength and function.    Personal Factors and Comorbidities Comorbidity 3+;Past/Current Experience;Profession;Time since onset of injury/illness/exacerbation    Comorbidities Anxiety, arthritis, HTN, inflammatory arthritis    Examination-Activity Limitations Stairs;Stand;Bend;Sit;Lift;Carry;Transfers;Locomotion Level    Stability/Clinical Decision Making Stable/Uncomplicated    Clinical Decision Making Low    Rehab Potential Fair    PT Frequency 2x / week    PT Duration 8 weeks    PT Treatment/Interventions Therapeutic exercise;Therapeutic activities;Neuromuscular re-education;Patient/family education;Manual techniques;Dry needling;Aquatic Therapy;Electrical Stimulation;Iontophoresis '4mg'$ /ml Dexamethasone    PT Next Visit Plan posture, trunk and glute strengthening, lumbopelvic control, manual techniques, modalities PRN    PT Home Exercise Plan Medbridge Access Code: [email or web address]    Consulted and Agree with Plan of Care Patient                 Joneen Boers PT, DPT  12/18/2021, 3:09 PM

## 2021-12-23 ENCOUNTER — Telehealth: Payer: Self-pay

## 2021-12-23 ENCOUNTER — Ambulatory Visit: Payer: No Typology Code available for payment source

## 2021-12-23 NOTE — Therapy (Signed)
Chart opened in error

## 2021-12-23 NOTE — Telephone Encounter (Signed)
No show. Called patient and left a message pertaining to appointment and a reminder for the next follow up session. Return phone call requested. Phone number ([phone]) provided.   

## 2021-12-26 ENCOUNTER — Ambulatory Visit: Payer: No Typology Code available for payment source

## 2021-12-26 DIAGNOSIS — M6281 Muscle weakness (generalized): Secondary | ICD-10-CM

## 2021-12-26 DIAGNOSIS — M5459 Other low back pain: Secondary | ICD-10-CM | POA: Diagnosis not present

## 2021-12-26 DIAGNOSIS — G8929 Other chronic pain: Secondary | ICD-10-CM

## 2021-12-26 DIAGNOSIS — R262 Difficulty in walking, not elsewhere classified: Secondary | ICD-10-CM

## 2021-12-26 NOTE — Therapy (Signed)
[facility] [address] [place], [address] Phone: [phone]   Fax:  [phone]  Physical Therapy Treatment  Patient Details  Name: Nichole Wells MRN: [id number] Date of Birth: 07-Mar-1960 Referring Provider (PT): Milinda Pointer, MD   Encounter Date: 12/26/2021   PT End of Session - 12/26/21 1511     Visit Number 6    Number of Visits 17    Date for PT Re-Evaluation 01/09/22    Authorization Type [employer] Time Period 11/13/21-01/09/22    Progress Note Due on Visit 10    PT Start Time 1502    PT Stop Time 1542    PT Time Calculation (min) 40 min    Activity Tolerance Patient tolerated treatment well    Behavior During Therapy WFL for tasks assessed/performed             Past Medical History:  Diagnosis Date   Anxiety    Arthritis    poss ra   GERD (gastroesophageal reflux disease)    occ   Heart murmur    Hypertension    Inflammatory arthritis    Vitamin D deficiency     Past Surgical History:  Procedure Laterality Date   BREAST BIOPSY Left    stereo- neg   BREAST CYST ASPIRATION Right    neg   BREAST SURGERY     benign biopsy   CHOLECYSTECTOMY     COLONOSCOPY WITH PROPOFOL N/A 10/11/2019   Procedure: COLONOSCOPY WITH PROPOFOL;  Surgeon: [name], MD;  Location: [facility] ENDOSCOPY;  Service: Gastroenterology;  Laterality: N/A;   COLONOSCOPY WITH PROPOFOL N/A 06/26/2021   Procedure: COLONOSCOPY WITH PROPOFOL;  Surgeon: [name], MD;  Location: [facility] ENDOSCOPY;  Service: Gastroenterology;  Laterality: N/A;   DILATION AND CURETTAGE OF UTERUS     LAPAROSCOPIC LYSIS OF ADHESIONS  12/10/2015   Procedure: LAPAROSCOPIC LYSIS OF ADHESIONS;  Surgeon: Brayton Mars, MD;  Location: [facility] ORS;  Service: Gynecology;;   LAPAROSCOPIC VAGINAL HYSTERECTOMY WITH SALPINGO OOPHORECTOMY Bilateral 01/28/2016   Procedure: LAPAROSCOPIC ASSISTED VAGINAL HYSTERECTOMY WITH  SALPINGO OOPHORECTOMY;  Surgeon: Brayton Mars, MD;  Location: [facility] ORS;  Service: Gynecology;  Laterality: Bilateral;   LAPAROSCOPY N/A 12/10/2015   Procedure: LAPAROSCOPY DIAGNOSTIC WITH BIOPSIES;  Surgeon: Brayton Mars, MD;  Location: [facility] ORS;  Service: Gynecology;  Laterality: N/A;   SHOULDER ARTHROSCOPY Right    TUBAL LIGATION     UTERINE FIBROID EMBOLIZATION      There were no vitals filed for this visit.   Subjective Assessment - 12/26/21 1505     Subjective Pt reports 7-8/10 pain in left back, remains fairly high in general. Her exercises sometimes aggravates her symptoms.    Pertinent History Low back pain. Predominantly L Low back. pain only went down L LE only 2x for the past couple of months but went away quickly (posterior hip and thigh along the sciatic pathway L side). Pain began several years ago, gradual onset. Back currently hurts all the time. Tried PT several years ago for her back which did not help (did LTR, naval ins, S/L hip abduction, e-stim; no manual therapy).    Patient Stated Goals Decrease pain, pain relief.    Currently in Pain? Yes    Pain Score 8     Pain Location Back    Pain Orientation Left    Pain Descriptors / Indicators Aching  INTERVENTION THIS DATE: -Hooklying on moist heat to low back x4 minutes -Education on dry needling, pt agreeable to trial  -TPDN to left posterior iliac crest/ gluteus medius/maximus overlap  *most higher portions directly over palpable band are pinchy and sharp, so decided to move more lateral and inferior to gain more consistent response.  -Post needling sustained release without any significant improvement in pain relief.  -seated yellowTB reverse clam with green ball knee space 1x15x3secH -lateral side stepping at plinth YTB at knees to fatigue -seated Left hip FABER stretch x30sec  -seated Red TB clam with green ball foot spacer 1x15x3secH   *already has some post needling soreness at  end of session, not akin to typical symptomatic pain *CC pain unchanged at end of session.     PT Education - 12/26/21 1519     Education Details benefits and risks associated with dry needling    Person(s) Educated Patient    Methods Explanation    Comprehension Verbalized understanding;Returned demonstration              PT Short Term Goals - 11/13/21 1539       PT SHORT TERM GOAL #1   Title Pt will be independent with her initial HEP to decrease pain, improve strength and function.    Baseline Pt has started her initial HEP (11/13/2021)    Time 3    Period Weeks    Status New    Target Date 12/05/21               PT Long Term Goals - 11/13/21 1541       PT LONG TERM GOAL #1   Title Pt will have a decrease in low back pain to 4/10 or less at worst to promote ability to move around, stand, sit, bend over, ambulate, negotiate stairs more comfortably.    Baseline 10/10 low back pain at worst for the past 3 months (11/13/2021)    Time 8    Period Weeks    Status New    Target Date 01/09/22      PT LONG TERM GOAL #2   Title Pt will improve her lumbar FOTO score by at least 10 points as a demonstration of improved function.    Baseline Lumbar Spine FOTO 53 (11/13/2021)    Time 8    Period Weeks    Status New    Target Date 01/09/22      PT LONG TERM GOAL #3   Title Pt will improve bilateral hip extension and abduction strength by at least 1/2 MMT grade to promote ability to ambulate, perform standing tasks, negotiate stairs with less pain.    Baseline Hip extension 3+/5 R, 4-/5 L, hip abduction 4/5 R and L (11/13/2021)    Time 8    Period Weeks    Status New    Target Date 01/09/22                   Plan - 12/26/21 1548     Clinical Impression Statement Continued to work on pain management strategies. Intro to dry needling today after pt consent. Most focal pain in gluteal fibers near left superior posterior iliac crest. Unable to elicit similar  symptoms nor relief in pain. Pt toleraates stretches and gluteal resistance training thereafter, but pain is unchaged. Will conitnue to follow.    Personal Factors and Comorbidities Comorbidity 3+;Past/Current Experience;Profession;Time since onset of injury/illness/exacerbation    Comorbidities Anxiety, arthritis, HTN, inflammatory arthritis  Examination-Activity Limitations Stairs;Stand;Bend;Sit;Lift;Carry;Transfers;Locomotion Level    Stability/Clinical Decision Making Stable/Uncomplicated    Clinical Decision Making Low    Rehab Potential Fair    PT Frequency 2x / week    PT Duration 8 weeks    PT Treatment/Interventions Therapeutic exercise;Therapeutic activities;Neuromuscular re-education;Patient/family education;Manual techniques;Dry needling;Aquatic Therapy;Electrical Stimulation;Iontophoresis '4mg'$ /ml Dexamethasone    PT Next Visit Plan posture, trunk and glute strengthening, lumbopelvic control, manual techniques, modalities PRN    PT Home Exercise Plan Medbridge Access Code: [email or web address]    Consulted and Agree with Plan of Care Patient             Patient will benefit from skilled therapeutic intervention in order to improve the following deficits and impairments:  Pain, Postural dysfunction, Improper body mechanics, Decreased strength, Difficulty walking, Decreased range of motion  Visit Diagnosis: Other low back pain  Chronic left-sided low back pain with left-sided sciatica  Difficulty in walking, not elsewhere classified  Muscle weakness (generalized)     Problem List Patient Active Problem List   Diagnosis Date Noted   Heart palpitations 10/23/2021   Lumbosacral facet hypertrophy ( L3-4, L4-5, and L5-S1) (Bilateral) 10/08/2021    Class: Chronic   Perennial allergic rhinitis 08/26/2021   Essential hypertension 07/24/2021   Vitamin B12 deficiency 07/24/2021   Abnormal MRI, lumbar spine (06/03/2014 & 9/9 /2023) 06/13/2021   Low back pain of over 3 months duration  05/16/2021   Spondylosis without myelopathy or radiculopathy, lumbosacral region 05/16/2021   DDD (degenerative disc disease), cervical (Multilevel) 01/14/2021   Cervical facet arthropathy (Multilevel) (Bilateral) 01/14/2021   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 01/14/2021   Retrolisthesis of lumbar spine at L1/L2 and L2/L3 01/14/2021   Lumbosacral facet arthropathy (Multilevel) (Bilateral) 01/14/2021   Osteoarthritis of hips (Bilateral) 01/14/2021   Osteoarthritis of wrist (Right) 01/14/2021   Osteoarthritis of first carpometacarpal joint of hand (Right) 01/14/2021   Chronic pain syndrome 11/14/2020   Disorder of skeletal system 11/14/2020   Problems influencing health status 11/14/2020   Chronic low back pain (1ry area of Pain) (Bilateral) (L>R) w/o sciatica 11/14/2020    Class: Chronic   Lumbosacral facet syndrome (Bilateral) 11/14/2020   Chronic wrist pain (2ry area of Pain) (Right) 11/14/2020   History of carpal tunnel surgery of wrist (Right) 11/14/2020   Chronic neck pain (3ry area of Pain) (Bilateral) (L>R) 11/14/2020   Cervicalgia 11/14/2020   Cervical facet syndrome 11/14/2020   Chronic hip pain (4th area of Pain) (Bilateral) 11/14/2020   Recurrent major depressive disorder, in remission ([place]) 02/18/2018   Thoracic aorta atherosclerosis ([place]) 11/10/2017   Centrilobular emphysema ([place]) 11/10/2017   Abnormal liver CT 11/10/2017   Seronegative inflammatory arthritis 05/07/2017   Tobacco use 05/07/2017   Vitamin D deficiency 05/28/2016   Surgical menopause on hormone replacement therapy 04/23/2016   Status post laparoscopic assisted vaginal hysterectomy (LAVH) 01/28/2016   Family history of ovarian cancer 11/13/2015   Insomnia 03/27/2015   Uncontrolled hypertension 08/30/2014   Anxiety and depression 08/30/2014   Cardiac murmur 08/30/2014   HLD (hyperlipidemia) 08/30/2014   DDD (degenerative disc disease), lumbar 07/26/2014   3:50 PM, 12/26/21 Etta Grandchild, PT, DPT Physical Therapist - [place] [phone] (Office)   Bradshaw C, PT 12/26/2021, 3:50 PM  [facility] [address], [place], [address] Phone: [phone]   Fax:  [phone]  Name: Nichole Wells MRN: [id number] Date of Birth: 05-18-1960

## 2021-12-30 ENCOUNTER — Encounter: Payer: Self-pay | Admitting: Internal Medicine

## 2021-12-30 ENCOUNTER — Ambulatory Visit (INDEPENDENT_AMBULATORY_CARE_PROVIDER_SITE_OTHER): Payer: No Typology Code available for payment source | Admitting: Internal Medicine

## 2021-12-30 VITALS — BP 130/80 | HR 85 | Temp 98.3°F | Resp 18 | Ht 62.0 in | Wt 172.3 lb

## 2021-12-30 DIAGNOSIS — J069 Acute upper respiratory infection, unspecified: Secondary | ICD-10-CM

## 2021-12-30 DIAGNOSIS — R051 Acute cough: Secondary | ICD-10-CM | POA: Diagnosis not present

## 2021-12-30 LAB — POCT INFLUENZA A/B
Influenza A, POC: NEGATIVE
Influenza B, POC: NEGATIVE

## 2021-12-30 MED ORDER — BENZONATATE 100 MG PO CAPS: 100.0000 mg | ORAL_CAPSULE | Freq: Two times a day (BID) | ORAL | 0 refills | Status: DC | PRN

## 2021-12-30 MED ORDER — METHYLPREDNISOLONE 4 MG PO TBPK: ORAL_TABLET | ORAL | 0 refills | Status: DC

## 2021-12-30 NOTE — Progress Notes (Signed)
Acute Office Visit  Subjective:     Patient ID: Nichole Wells, female    DOB: 1960/09/29, 61 y.o.   MRN: [id number]  Chief Complaint  Patient presents with   URI    HPI Patient is in today for cough. Symptoms started 5 days ago.   URI Compliant:  -Fever: elevated temperatures to 99 last night  -Cough: yes; dry but sometime productive brownish  -Shortness of breath: yes -Wheezing: no -Chest tightness: no -Chest congestion: yes -Nasal congestion: yes -Runny nose: yes; green mucus from nose  -Post nasal drip: yes -Sore throat: yes -Sinus pressure: yes -Headache: yes -Face pain: yes -Ear pain: yes bilateral -Ear pressure: yes bilateral  -Vomiting: no -Sick contacts: yes; husband  -Context: worse -Relief with OTC cold/cough medications: no  -Treatments attempted: Alka seltzer plus, zyrtec and Flonase     Review of Systems  Constitutional:  Positive for chills. Negative for fever.  HENT:  Positive for congestion, ear pain, sinus pain and sore throat.   Respiratory:  Positive for cough, sputum production, shortness of breath and wheezing.   Cardiovascular:  Negative for chest pain.  Gastrointestinal:  Negative for abdominal pain, diarrhea, nausea and vomiting.  Neurological:  Positive for headaches.        Objective:    BP 130/80   Pulse 85   Temp 98.3 F (36.8 C)   Resp 18   Ht '5\' 2"'$  (1.575 m)   Wt 172 lb 4.8 oz (78.2 kg)   LMP 12/24/2015 (Approximate)   SpO2 96%   BMI 31.51 kg/m  BP Readings from Last 3 Encounters:  11/26/21 130/70  10/09/21 (!) 146/70  09/26/21 136/68   Wt Readings from Last 3 Encounters:  11/26/21 175 lb 14.4 oz (79.8 kg)  10/09/21 177 lb (80.3 kg)  09/26/21 177 lb (80.3 kg)      Physical Exam Constitutional:      Appearance: Normal appearance.  HENT:     Head: Normocephalic and atraumatic.     Right Ear: Ear canal and external ear normal.     Left Ear: Ear canal and external ear normal.     Ears:     Comments:  Bilateral eustachian tube dysfunction    Nose: Congestion present.     Mouth/Throat:     Mouth: Mucous membranes are moist.     Pharynx: Posterior oropharyngeal erythema present.  Eyes:     Conjunctiva/sclera: Conjunctivae normal.  Cardiovascular:     Rate and Rhythm: Normal rate and regular rhythm.  Pulmonary:     Effort: Pulmonary effort is normal.     Breath sounds: Wheezing present. No rhonchi or rales.  Lymphadenopathy:     Cervical: Cervical adenopathy present.  Skin:    General: Skin is warm and dry.  Neurological:     General: No focal deficit present.     Mental Status: She is alert. Mental status is at baseline.  Psychiatric:        Mood and Affect: Mood normal.        Behavior: Behavior normal.    No results found for any visits on 12/30/21.     Assessment & Plan:   1. Viral upper respiratory tract infection/Acute cough: Symptoms going on for 5 days, tested for COVID and flu today. Will treat with Medrol dose pack, Flonase, Mucinex in the morning and tessalon perles at night. If symptoms worsen or fail to improve she will follow up for antibiotics, she does have a penicillin allergy.   -  Novel Coronavirus, NAA ([facility]) - POCT Influenza A/B - methylPREDNISolone (MEDROL DOSEPAK) 4 MG TBPK tablet; Day 1: Take 8 mg (2 tablets) before breakfast, 4 mg (1 tablet) after lunch, 4 mg (1 tablet) after supper, and 8 mg (2 tablets) at bedtime. Day 2:Take 4 mg (1 tablet) before breakfast, 4 mg (1 tablet) after lunch, 4 mg (1 tablet) after supper, and 8 mg (2 tablets) at bedtime. Day 3: Take 4 mg (1 tablet) before breakfast, 4 mg (1 tablet) after lunch, 4 mg (1 tablet) after supper, and 4 mg (1 tablet) at bedtime. Day 4: Take 4 mg (1 tablet) before breakfast, 4 mg (1 tablet) after lunch, and 4 mg (1 tablet) at bedtime. Day 5: Take 4 mg (1 tablet) before breakfast and 4 mg (1 tablet) at bedtime. Day 6: Take 4 mg (1 tablet) before breakfast.  Dispense: 1 each; Refill: 0 - benzonatate  (TESSALON) 100 MG capsule; Take 1 capsule (100 mg total) by mouth 2 (two) times daily as needed for cough.  Dispense: 20 capsule; Refill: 0   Return if symptoms worsen or fail to improve.  Teodora Medici, DO

## 2021-12-31 LAB — NOVEL CORONAVIRUS, NAA: SARS-CoV-2, NAA: NOT DETECTED

## 2022-01-01 ENCOUNTER — Ambulatory Visit: Payer: No Typology Code available for payment source

## 2022-01-01 ENCOUNTER — Telehealth: Payer: Self-pay

## 2022-01-01 NOTE — Telephone Encounter (Signed)
No show. Called patient and left a message pertaining to appointment and a reminder for the next follow up session. Return phone call requested. Phone number ([phone]) provided.   

## 2022-01-07 ENCOUNTER — Ambulatory Visit: Payer: No Typology Code available for payment source

## 2022-02-19 ENCOUNTER — Other Ambulatory Visit: Payer: Self-pay | Admitting: Family Medicine

## 2022-02-19 DIAGNOSIS — I10 Essential (primary) hypertension: Secondary | ICD-10-CM

## 2022-02-20 ENCOUNTER — Other Ambulatory Visit: Payer: Self-pay | Admitting: Family Medicine

## 2022-02-20 DIAGNOSIS — J3089 Other allergic rhinitis: Secondary | ICD-10-CM

## 2022-03-09 ENCOUNTER — Other Ambulatory Visit: Payer: Self-pay | Admitting: Family Medicine

## 2022-03-09 DIAGNOSIS — E7800 Pure hypercholesterolemia, unspecified: Secondary | ICD-10-CM

## 2022-03-09 DIAGNOSIS — I700 Atherosclerosis of aorta: Secondary | ICD-10-CM

## 2022-03-12 ENCOUNTER — Other Ambulatory Visit: Payer: Self-pay | Admitting: Family Medicine

## 2022-03-12 DIAGNOSIS — I10 Essential (primary) hypertension: Secondary | ICD-10-CM

## 2022-03-27 NOTE — Progress Notes (Signed)
Name: Nichole Wells   MRN: [id number]    DOB: 20-Mar-1960   Date:03/28/2022       Progress Note  Subjective  Chief Complaint  Follow Up  HPI  Emphysema with COPD  : she is unable to afford Trelegy or Brezti, we will try Spiriva  She has a history of smoking 1 pack daily for over 30 years. She did not have her lung cancer screening, smoking about 8 cigarettes per day and has intermittent cough but no sob    Inflammatory arthritis: she missed follow up with Dr. Jefm Bryant , she has been off Methotrexate for over 2 years, he has stiffness and pain on both hands and lately also noticing some swelling on her fingers, feeling stiff when she wakes up and has aching pain on multiple joints .  Marland Kitchen She still has Raynaud's . Reminded her again to go back to Rheumatologist  Chronic low back pain :she stopped going to the pain clinic, insurance was not paying for procedures, she had PT, she still has daily pain and at times more intense, she would like to go up on dose of gabapentin , we will switch from 200 to 300 mg qhs    HTN: Taking Tribenzor and toprol and bp is at goal ,however she does not like the fact that she has been swelling on hands, explained likely from inflammatory arthritis but we can try decreasing dose of norvasc and increasing hctz from tribenzor 40/10/12.5 to 40/5/25 mg and monitor   Hyperlipidemia/Aorta atherosclerosis:Atherosclerosis found on CT chest. Last LDL was 121 but she is now taking crestor in the mornings and it has increased her compliance, no side effects We will recheck it during her CPE    Major Depression: she was diagnosed in her 34's, she has been taking Prozac for many years, tried other medication in the past. Initially seen by psychiatrist and had psychotherapy.  Still in remission and doing well. She does not want to stop medication because she had severe episode that required hospitalization in her 53's for suicide thoughts. She has wellbutrin at home but has not started  it yet  Hyperglycemia: she denies polyphagia, polydipsia or polyuria. last A1C was normal at 5.5 % , it was as high as 5.8 % in the past. She has not really changed her diet yet    Patient Active Problem List   Diagnosis Date Noted   Heart palpitations 10/23/2021   Lumbosacral facet hypertrophy ( L3-4, L4-5, and L5-S1) (Bilateral) 10/08/2021    Class: Chronic   Perennial allergic rhinitis 08/26/2021   Essential hypertension 07/24/2021   Vitamin B12 deficiency 07/24/2021   Abnormal MRI, lumbar spine (06/03/2014 & 9/9 /2023) 06/13/2021   Low back pain of over 3 months duration 05/16/2021   Spondylosis without myelopathy or radiculopathy, lumbosacral region 05/16/2021   DDD (degenerative disc disease), cervical (Multilevel) 01/14/2021   Cervical facet arthropathy (Multilevel) (Bilateral) 01/14/2021   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 01/14/2021   Retrolisthesis of lumbar spine at L1/L2 and L2/L3 01/14/2021   Lumbosacral facet arthropathy (Multilevel) (Bilateral) 01/14/2021   Osteoarthritis of hips (Bilateral) 01/14/2021   Osteoarthritis of wrist (Right) 01/14/2021   Osteoarthritis of first carpometacarpal joint of hand (Right) 01/14/2021   Chronic pain syndrome 11/14/2020   Disorder of skeletal system 11/14/2020   Problems influencing health status 11/14/2020   Chronic low back pain (1ry area of Pain) (Bilateral) (L>R) w/o sciatica 11/14/2020    Class: Chronic   Lumbosacral facet syndrome (Bilateral)  11/14/2020   Chronic wrist pain (2ry area of Pain) (Right) 11/14/2020   History of carpal tunnel surgery of wrist (Right) 11/14/2020   Chronic neck pain (3ry area of Pain) (Bilateral) (L>R) 11/14/2020   Cervicalgia 11/14/2020   Cervical facet syndrome 11/14/2020   Chronic hip pain (4th area of Pain) (Bilateral) 11/14/2020   Recurrent major depressive disorder, in remission ([place]) 02/18/2018   Thoracic aorta atherosclerosis ([place]) 11/10/2017   Centrilobular emphysema  ([place]) 11/10/2017   Abnormal liver CT 11/10/2017   Seronegative inflammatory arthritis 05/07/2017   Tobacco use 05/07/2017   Vitamin D deficiency 05/28/2016   Surgical menopause on hormone replacement therapy 04/23/2016   Status post laparoscopic assisted vaginal hysterectomy (LAVH) 01/28/2016   Family history of ovarian cancer 11/13/2015   Insomnia 03/27/2015   Uncontrolled hypertension 08/30/2014   Anxiety and depression 08/30/2014   Cardiac murmur 08/30/2014   HLD (hyperlipidemia) 08/30/2014   DDD (degenerative disc disease), lumbar 07/26/2014    Past Surgical History:  Procedure Laterality Date   BREAST BIOPSY Left    stereo- neg   BREAST CYST ASPIRATION Right    neg   BREAST SURGERY     benign biopsy   CHOLECYSTECTOMY     COLONOSCOPY WITH PROPOFOL N/A 10/11/2019   Procedure: COLONOSCOPY WITH PROPOFOL;  Surgeon: [name], MD;  Location: [facility] ENDOSCOPY;  Service: Gastroenterology;  Laterality: N/A;   COLONOSCOPY WITH PROPOFOL N/A 06/26/2021   Procedure: COLONOSCOPY WITH PROPOFOL;  Surgeon: [name], MD;  Location: [facility] ENDOSCOPY;  Service: Gastroenterology;  Laterality: N/A;   DILATION AND CURETTAGE OF UTERUS     LAPAROSCOPIC LYSIS OF ADHESIONS  12/10/2015   Procedure: LAPAROSCOPIC LYSIS OF ADHESIONS;  Surgeon: Brayton Mars, MD;  Location: [facility] ORS;  Service: Gynecology;;   LAPAROSCOPIC VAGINAL HYSTERECTOMY WITH SALPINGO OOPHORECTOMY Bilateral 01/28/2016   Procedure: LAPAROSCOPIC ASSISTED VAGINAL HYSTERECTOMY WITH SALPINGO OOPHORECTOMY;  Surgeon: Brayton Mars, MD;  Location: [facility] ORS;  Service: Gynecology;  Laterality: Bilateral;   LAPAROSCOPY N/A 12/10/2015   Procedure: LAPAROSCOPY DIAGNOSTIC WITH BIOPSIES;  Surgeon: Brayton Mars, MD;  Location: [facility] ORS;  Service: Gynecology;  Laterality: N/A;   SHOULDER ARTHROSCOPY Right    TUBAL LIGATION     UTERINE FIBROID EMBOLIZATION      Family History  Problem Relation Age of Onset   Cancer Mother         cervical, lung   Lung cancer Mother    Heart disease Father    Hypertension Father    COPD Father    Breast cancer Maternal Aunt    Ovarian cancer Maternal 6    Spina bifida Sister    Diabetes Son    Diabetes Maternal Grandmother    Heart disease Maternal Grandmother    Parkinson's disease Paternal Grandfather     Social History   Tobacco Use   Smoking status: Every Day    Packs/day: 1.00    Years: 37.00    Total pack years: 37.00    Types: Cigarettes    Start date: 10/06/1987   Smokeless tobacco: Never  Substance Use Topics   Alcohol use: Yes    Alcohol/week: 0.0 standard drinks of alcohol    Comment: none last 24hr     Current Outpatient Medications:    albuterol (VENTOLIN HFA) 108 (90 Base) MCG/ACT inhaler, Inhale 2 puffs into the lungs every 4 (four) hours as needed for wheezing or shortness of breath., Disp: 8.5 each, Rfl: 1   cetirizine (ZYRTEC) 10 MG tablet, Take 10 mg  by mouth daily., Disp: , Rfl:    Cyanocobalamin (VITAMIN B-12) 500 MCG SUBL, Place 1 tablet (500 mcg total) under the tongue daily at 12 noon., Disp: 100 tablet, Rfl: 1   FLUoxetine (PROZAC) 40 MG capsule, TAKE 1 CAPSULE BY MOUTH EVERY DAY, Disp: 90 capsule, Rfl: 1   fluticasone (FLONASE) 50 MCG/ACT nasal spray, SPRAY 2 SPRAYS INTO EACH NOSTRIL EVERY DAY, Disp: 48 mL, Rfl: 0   gabapentin (NEURONTIN) 100 MG capsule, Take 1-2 capsules (100-200 mg total) by mouth at bedtime., Disp: 180 capsule, Rfl: 1   hydrOXYzine (ATARAX) 10 MG tablet, Take 1-2 tablets (10-20 mg total) by mouth 3 (three) times daily as needed., Disp: 30 tablet, Rfl: 0   metoprolol succinate (TOPROL-XL) 50 MG 24 hr tablet, TAKE 1 TABLET BY MOUTH DAILY. TAKE WITH OR IMMEDIATELY FOLLOWING A MEAL., Disp: 90 tablet, Rfl: 0   rosuvastatin (CRESTOR) 40 MG tablet, TAKE 1 TABLET BY MOUTH EVERY DAY, Disp: 90 tablet, Rfl: 1   Vitamin D, Cholecalciferol, 50 MCG (2000 UT) CAPS, Take by mouth daily., Disp: , Rfl:   Allergies  Allergen  Reactions   Hydroxychloroquine Itching    Pt take zyrtec to alleviate symptoms    Penicillin G Hives    Has patient had a PCN reaction causing immediate rash, facial/tongue/throat swelling, SOB or lightheadedness with hypotension:unsure Has patient had a PCN reaction causing severe rash involving mucus membranes or skin necrosis:unsure Has patient had a PCN reaction that required hospitalization:unsure Has patient had a PCN reaction occurring within the last 10 years:No If all of the above answers are "NO", then may proceed with Cephalosporin use.     I personally reviewed active problem list, medication list, allergies, family history, social history, health maintenance with the patient/caregiver today.   ROS  Constitutional: Negative for fever or weight change.  Respiratory: Negative for cough and shortness of breath.   Cardiovascular: Negative for chest pain or palpitations.  Gastrointestinal: Negative for abdominal pain, no bowel changes.  Musculoskeletal: Negative for gait problem , positive for  joint swelling.  Skin: Negative for rash.  Neurological: Negative for dizziness or headache.  No other specific complaints in a complete review of systems (except as listed in HPI above).   Objective  Vitals:   03/28/22 0856  BP: 128/68  Pulse: 75  Resp: 16  SpO2: 96%  Weight: 175 lb (79.4 kg)  Height: 5' 2"$  (1.575 m)    Body mass index is 32.01 kg/m.  Physical Exam  Constitutional: Patient appears well-developed and well-nourished. Obese  No distress.  HEENT: head atraumatic, normocephalic, pupils equal and reactive to light,, neck supple Cardiovascular: Normal rate, regular rhythm and normal heart sounds.  No murmur heard. No BLE edema. Pulmonary/Chest: Effort normal and breath sounds normal. No respiratory distress. Abdominal: Soft.  There is no tenderness. Muscular skeletal: synovitis hands  Psychiatric: Patient has a normal mood and affect. behavior is normal.  Judgment and thought content normal.    PHQ2/9:    03/28/2022    8:56 AM 12/30/2021    8:51 AM 11/26/2021    8:39 AM 10/09/2021    2:27 PM 09/17/2021    1:48 PM  Depression screen PHQ 2/9  Decreased Interest 0 0 0 0 0  Down, Depressed, Hopeless 0 0 0 0 0  PHQ - 2 Score 0 0 0 0 0  Altered sleeping 0 0 0  0  Tired, decreased energy 0 0 0  2  Change in appetite 0 0 0  0  Feeling bad or failure about yourself  0 0 0  0  Trouble concentrating 0 0 0  0  Moving slowly or fidgety/restless 0 0 0  0  Suicidal thoughts 0 0 0  0  PHQ-9 Score 0 0 0  2  Difficult doing work/chores  Not difficult at all   Not difficult at all    phq 9 is negative   Fall Risk:    03/28/2022    8:56 AM 12/30/2021    8:51 AM 11/26/2021    8:38 AM 10/09/2021    2:27 PM 09/17/2021    1:48 PM  Fall Risk   Falls in the past year? 0 0 0 0 0  Number falls in past yr: 0 0     Injury with Fall? 0 0     Risk for fall due to : No Fall Risks    No Fall Risks  Follow up Falls prevention discussed  Falls prevention discussed;Education provided;Falls evaluation completed  Falls prevention discussed      Functional Status Survey: Is the patient deaf or have difficulty hearing?: No Does the patient have difficulty seeing, even when wearing glasses/contacts?: No Does the patient have difficulty concentrating, remembering, or making decisions?: No Does the patient have difficulty walking or climbing stairs?: No Does the patient have difficulty dressing or bathing?: No Does the patient have difficulty doing errands alone such as visiting a doctor's office or shopping?: No    Assessment & Plan  1. Centrilobular emphysema ([employer])  - Ambulatory Referral Lung Cancer Screening [facility]  2. Recurrent major depressive disorder, in remission (H[facility])  - FLUoxetine (PROZAC) 40 MG capsule; TAKE 1 CAPSULE BY MOUTH EVERY DAY  Dispense: 90 capsule; Refill: 1  3. Thoracic aorta atherosclerosis ([facility])  - rosuvastatin  (CRESTOR) 40 MG tablet; Take 1 tablet (40 mg total) by mouth daily.  Dispense: 90 tablet; Refill: 1  4. DDD (degenerative disc disease), lumbar  - gabapentin (NEURONTIN) 300 MG capsule; Take 1 capsule (300 mg total) by mouth at bedtime.  Dispense: 90 capsule; Refill: 1  5. Chronic left-sided low back pain without sciatica  - gabapentin (NEURONTIN) 300 MG capsule; Take 1 capsule (300 mg total) by mouth at bedtime.  Dispense: 90 capsule; Refill: 1  6. Tobacco use  - Ambulatory Referral Lung Cancer Screening [facility]  7. Anxiety with flying  - hydrOXYzine (ATARAX) 10 MG tablet; Take 1-2 tablets (10-20 mg total) by mouth 3 (three) times daily as needed.  Dispense: 30 tablet; Refill: 0  8. Essential hypertension  - metoprolol succinate (TOPROL-XL) 50 MG 24 hr tablet; Take 1 tablet (50 mg total) by mouth daily. TAKE WITH OR IMMEDIATELY FOLLOWING A MEAL.  Dispense: 90 tablet; Refill: 0 - Olmesartan-amLODIPine-HCTZ (TRIBENZOR) 40-5-25 MG TABS; Take 1 tablet by mouth daily at 12 noon.  Dispense: 90 tablet; Refill: 0  9. Pure hypercholesterolemia  - rosuvastatin (CRESTOR) 40 MG tablet; Take 1 tablet (40 mg total) by mouth daily.  Dispense: 90 tablet; Refill: 1

## 2022-03-28 ENCOUNTER — Other Ambulatory Visit: Payer: Self-pay | Admitting: Family Medicine

## 2022-03-28 ENCOUNTER — Ambulatory Visit (INDEPENDENT_AMBULATORY_CARE_PROVIDER_SITE_OTHER): Payer: No Typology Code available for payment source | Admitting: Family Medicine

## 2022-03-28 ENCOUNTER — Encounter: Payer: Self-pay | Admitting: Family Medicine

## 2022-03-28 VITALS — BP 128/68 | HR 75 | Resp 16 | Ht 62.0 in | Wt 175.0 lb

## 2022-03-28 DIAGNOSIS — I700 Atherosclerosis of aorta: Secondary | ICD-10-CM

## 2022-03-28 DIAGNOSIS — G8929 Other chronic pain: Secondary | ICD-10-CM

## 2022-03-28 DIAGNOSIS — M545 Low back pain, unspecified: Secondary | ICD-10-CM

## 2022-03-28 DIAGNOSIS — M5136 Other intervertebral disc degeneration, lumbar region: Secondary | ICD-10-CM

## 2022-03-28 DIAGNOSIS — J432 Centrilobular emphysema: Secondary | ICD-10-CM

## 2022-03-28 DIAGNOSIS — E7800 Pure hypercholesterolemia, unspecified: Secondary | ICD-10-CM

## 2022-03-28 DIAGNOSIS — F40243 Fear of flying: Secondary | ICD-10-CM

## 2022-03-28 DIAGNOSIS — F3340 Major depressive disorder, recurrent, in remission, unspecified: Secondary | ICD-10-CM | POA: Diagnosis not present

## 2022-03-28 DIAGNOSIS — I10 Essential (primary) hypertension: Secondary | ICD-10-CM

## 2022-03-28 DIAGNOSIS — Z720 Tobacco use: Secondary | ICD-10-CM

## 2022-03-28 MED ORDER — FLUOXETINE HCL 40 MG PO CAPS: ORAL_CAPSULE | ORAL | 1 refills | Status: DC

## 2022-03-28 MED ORDER — OLMESARTAN-AMLODIPINE-HCTZ 40-5-25 MG PO TABS: 1.0000 | ORAL_TABLET | Freq: Every day | ORAL | 0 refills | Status: DC

## 2022-03-28 MED ORDER — GABAPENTIN 300 MG PO CAPS: 300.0000 mg | ORAL_CAPSULE | Freq: Every day | ORAL | 1 refills | Status: DC

## 2022-03-28 MED ORDER — ROSUVASTATIN CALCIUM 40 MG PO TABS: 40.0000 mg | ORAL_TABLET | Freq: Every day | ORAL | 1 refills | Status: DC

## 2022-03-28 MED ORDER — METOPROLOL SUCCINATE ER 50 MG PO TB24: 50.0000 mg | ORAL_TABLET | Freq: Every day | ORAL | 0 refills | Status: DC

## 2022-03-28 MED ORDER — HYDROXYZINE HCL 10 MG PO TABS: 10.0000 mg | ORAL_TABLET | Freq: Three times a day (TID) | ORAL | 0 refills | Status: DC | PRN

## 2022-05-18 ENCOUNTER — Other Ambulatory Visit: Payer: Self-pay | Admitting: Family Medicine

## 2022-05-18 DIAGNOSIS — J3089 Other allergic rhinitis: Secondary | ICD-10-CM

## 2022-06-17 NOTE — Patient Instructions (Incomplete)
Preventive Care 40-60 Years Old, Female Preventive care refers to lifestyle choices and visits with your health care provider that can promote health and wellness. Preventive care visits are also called wellness exams. What can I expect for my preventive care visit? Counseling Your health care provider may ask you questions about your: Medical history, including: Past medical problems. Family medical history. Pregnancy history. Current health, including: Menstrual cycle. Method of birth control. Emotional well-being. Home life and relationship well-being. Sexual activity and sexual health. Lifestyle, including: Alcohol, nicotine or tobacco, and drug use. Access to firearms. Diet, exercise, and sleep habits. Work and work environment. Sunscreen use. Safety issues such as seatbelt and bike helmet use. Physical exam Your health care provider will check your: Height and weight. These may be used to calculate your BMI (body mass index). BMI is a measurement that tells if you are at a healthy weight. Waist circumference. This measures the distance around your waistline. This measurement also tells if you are at a healthy weight and may help predict your risk of certain diseases, such as type 2 diabetes and high blood pressure. Heart rate and blood pressure. Body temperature. Skin for abnormal spots. What immunizations do I need?  Vaccines are usually given at various ages, according to a schedule. Your health care provider will recommend vaccines for you based on your age, medical history, and lifestyle or other factors, such as travel or where you work. What tests do I need? Screening Your health care provider may recommend screening tests for certain conditions. This may include: Lipid and cholesterol levels. Diabetes screening. This is done by checking your blood sugar (glucose) after you have not eaten for a while (fasting). Pelvic exam and Pap test. Hepatitis B test. Hepatitis C  test. HIV (human immunodeficiency virus) test. STI (sexually transmitted infection) testing, if you are at risk. Lung cancer screening. Colorectal cancer screening. Mammogram. Talk with your health care provider about when you should start having regular mammograms. This may depend on whether you have a family history of breast cancer. BRCA-related cancer screening. This may be done if you have a family history of breast, ovarian, tubal, or peritoneal cancers. Bone density scan. This is done to screen for osteoporosis. Talk with your health care provider about your test results, treatment options, and if necessary, the need for more tests. Follow these instructions at home: Eating and drinking  Eat a diet that includes fresh fruits and vegetables, whole grains, lean protein, and low-fat dairy products. Take vitamin and mineral supplements as recommended by your health care provider. Do not drink alcohol if: Your health care provider tells you not to drink. You are pregnant, may be pregnant, or are planning to become pregnant. If you drink alcohol: Limit how much you have to 0-1 drink a day. Know how much alcohol is in your drink. In the [place] one drink equals one 12 oz bottle of beer (355 mL), one 5 oz glass of wine (148 mL), or one 1 oz glass of hard liquor (44 mL). Lifestyle Brush your teeth every morning and night with fluoride toothpaste. Floss one time each day. Exercise for at least 30 minutes 5 or more days each week. Do not use any products that contain nicotine or tobacco. These products include cigarettes, chewing tobacco, and vaping devices, such as e-cigarettes. If you need help quitting, ask your health care provider. Do not use drugs. If you are sexually active, practice safe sex. Use a condom or other form of protection to   prevent STIs. If you do not wish to become pregnant, use a form of birth control. If you plan to become pregnant, see your health care provider for a  prepregnancy visit. Take aspirin only as told by your health care provider. Make sure that you understand how much to take and what form to take. Work with your health care provider to find out whether it is safe and beneficial for you to take aspirin daily. Find healthy ways to manage stress, such as: Meditation, yoga, or listening to music. Journaling. Talking to a trusted person. Spending time with friends and family. Minimize exposure to UV radiation to reduce your risk of skin cancer. Safety Always wear your seat belt while driving or riding in a vehicle. Do not drive: If you have been drinking alcohol. Do not ride with someone who has been drinking. When you are tired or distracted. While texting. If you have been using any mind-altering substances or drugs. Wear a helmet and other protective equipment during sports activities. If you have firearms in your house, make sure you follow all gun safety procedures. Seek help if you have been physically or sexually abused. What's next? Visit your health care provider once a year for an annual wellness visit. Ask your health care provider how often you should have your eyes and teeth checked. Stay up to date on all vaccines. This information is not intended to replace advice given to you by your health care provider. Make sure you discuss any questions you have with your health care provider. Document Revised: 07/25/2020 Document Reviewed: 07/25/2020 Elsevier Patient Education  2023 [employer]  

## 2022-06-17 NOTE — Progress Notes (Unsigned)
Name: Nichole Wells   MRN: [id number]    DOB: 12-Aug-1960   Date:06/18/2022       Progress Note  Subjective  Chief Complaint  Annual Exam  HPI  Patient presents for annual CPE.  She fell from a curb at work in Feb , [address] comp. It was not high impact and we will check for osteoporosis   Diet: cooks at home, eats balanced most of the time Exercise:  not current due to left foot fracture  Last Eye Exam: yes Last Dental Exam: she is due for an appointment   Flowsheet Row Office Visit from 06/18/2022 in [facility]  AUDIT-C Score 3      Depression: Phq 9 is  negative    06/18/2022    7:44 AM 03/28/2022    8:56 AM 12/30/2021    8:51 AM 11/26/2021    8:39 AM 10/09/2021    2:27 PM  Depression screen PHQ 2/9  Decreased Interest 0 0 0 0 0  Down, Depressed, Hopeless 0 0 0 0 0  PHQ - 2 Score 0 0 0 0 0  Altered sleeping 0 0 0 0   Tired, decreased energy 0 0 0 0   Change in appetite 0 0 0 0   Feeling bad or failure about yourself  0 0 0 0   Trouble concentrating 0 0 0 0   Moving slowly or fidgety/restless 0 0 0 0   Suicidal thoughts 0 0 0 0   PHQ-9 Score 0 0 0 0   Difficult doing work/chores   Not difficult at all     Hypertension: BP Readings from Last 3 Encounters:  06/18/22 118/68  03/28/22 128/68  12/30/21 130/80   Obesity: Wt Readings from Last 3 Encounters:  06/18/22 172 lb (78 kg)  03/28/22 175 lb (79.4 kg)  12/30/21 172 lb 4.8 oz (78.2 kg)   BMI Readings from Last 3 Encounters:  06/18/22 31.46 kg/m  03/28/22 32.01 kg/m  12/30/21 31.51 kg/m     Vaccines:   HPV: N/A Tdap: up to date Shingrix: up to date Pneumonia: up to date  Flu: up to date COVID-19: up to date   Hep C Screening: 09/23/16 STD testing and prevention (HIV/chl/gon/syphilis): 01/30/16 Intimate partner violence: negative screen  Sexual History : one partner, no pain  Menstrual History/LMP/Abnormal Bleeding: s/p hysterectomy  Discussed importance of follow up  if any post-menopausal bleeding: yes  Incontinence Symptoms: negative for symptoms   Breast cancer:  - Last Mammogram: 04/24/21 - BRCA gene screening: maternal aunt had ovarian cancer. N/A  Osteoporosis Prevention : Discussed high calcium and vitamin D supplementation, weight bearing exercises Bone density: due to recent foot fracture we will order a bone density test   Cervical cancer screening: N/A  Skin cancer: Discussed monitoring for atypical lesions  Colorectal cancer: 06/26/21   Lung cancer:  Low Dose CT Chest recommended if Age 60-80 years, 20 pack-year currently smoking OR have quit w/in 15years. Patient is due for a screen  ECG: 09/26/21  Advanced Care Planning: A voluntary discussion about advance care planning including the explanation and discussion of advance directives.  Discussed health care proxy and Living will, and the patient was able to identify a health care proxy as husband .  Patient does not have a living will and power of attorney of health care   Lipids: Lab Results  Component Value Date   CHOL 199 06/12/2021   CHOL 195 07/12/2020   CHOL 138 07/20/2019  Lab Results  Component Value Date   HDL 52 06/12/2021   HDL 53 07/12/2020   HDL 53 07/20/2019   Lab Results  Component Value Date   LDLCALC 121 (H) 06/12/2021   LDLCALC 119 (H) 07/12/2020   LDLCALC 65 07/20/2019   Lab Results  Component Value Date   TRIG 147 06/12/2021   TRIG 123 07/12/2020   TRIG 112 07/20/2019   Lab Results  Component Value Date   CHOLHDL 3.8 06/12/2021   CHOLHDL 3.7 07/12/2020   CHOLHDL 2.6 07/20/2019   No results found for: "LDLDIRECT"  Glucose: Glucose  Date Value Ref Range Status  11/14/2020 100 (H) 70 - 99 mg/dL Final    Comment:                  **Please note reference interval change**  04/27/2012 106 (H) 65 - 99 mg/dL Final   Glucose, Bld  Date Value Ref Range Status  09/26/2021 169 (H) 70 - 99 mg/dL Final    Comment:    Glucose reference range applies  only to samples taken after fasting for at least 8 hours.  06/12/2021 94 65 - 99 mg/dL Final    Comment:    .            Fasting reference interval .   07/12/2020 100 (H) 65 - 99 mg/dL Final    Comment:    .            Fasting reference interval . For someone without known diabetes, a glucose value between 100 and 125 mg/dL is consistent with prediabetes and should be confirmed with a follow-up test. .     Patient Active Problem List   Diagnosis Date Noted   Heart palpitations 10/23/2021   Lumbosacral facet hypertrophy ( L3-4, L4-5, and L5-S1) (Bilateral) 10/08/2021    Class: Chronic   Perennial allergic rhinitis 08/26/2021   Essential hypertension 07/24/2021   Vitamin B12 deficiency 07/24/2021   Abnormal MRI, lumbar spine (06/03/2014 & 9/9 /2023) 06/13/2021   Low back pain of over 3 months duration 05/16/2021   Spondylosis without myelopathy or radiculopathy, lumbosacral region 05/16/2021   DDD (degenerative disc disease), cervical (Multilevel) 01/14/2021   Cervical facet arthropathy (Multilevel) (Bilateral) 01/14/2021   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 01/14/2021   Retrolisthesis of lumbar spine at L1/L2 and L2/L3 01/14/2021   Lumbosacral facet arthropathy (Multilevel) (Bilateral) 01/14/2021   Osteoarthritis of hips (Bilateral) 01/14/2021   Osteoarthritis of wrist (Right) 01/14/2021   Osteoarthritis of first carpometacarpal joint of hand (Right) 01/14/2021   Chronic pain syndrome 11/14/2020   Disorder of skeletal system 11/14/2020   Problems influencing health status 11/14/2020   Chronic low back pain (1ry area of Pain) (Bilateral) (L>R) w/o sciatica 11/14/2020    Class: Chronic   Lumbosacral facet syndrome (Bilateral) 11/14/2020   Chronic wrist pain (2ry area of Pain) (Right) 11/14/2020   History of carpal tunnel surgery of wrist (Right) 11/14/2020   Chronic neck pain (3ry area of Pain) (Bilateral) (L>R) 11/14/2020   Cervicalgia 11/14/2020    Cervical facet syndrome 11/14/2020   Chronic hip pain (4th area of Pain) (Bilateral) 11/14/2020   Recurrent major depressive disorder, in remission (HCC) 02/18/2018   Thoracic aorta atherosclerosis (HCC) 11/10/2017   Centrilobular emphysema (HCC) 11/10/2017   Abnormal liver CT 11/10/2017   Seronegative inflammatory arthritis 05/07/2017   Tobacco use 05/07/2017   Vitamin D deficiency 05/28/2016   Surgical menopause on hormone replacement therapy 04/23/2016  Status post laparoscopic assisted vaginal hysterectomy (LAVH) 01/28/2016   Family history of ovarian cancer 11/13/2015   Insomnia 03/27/2015   Uncontrolled hypertension 08/30/2014   Anxiety and depression 08/30/2014   Cardiac murmur 08/30/2014   HLD (hyperlipidemia) 08/30/2014   DDD (degenerative disc disease), lumbar 07/26/2014    Past Surgical History:  Procedure Laterality Date   BREAST BIOPSY Left    stereo- neg   BREAST CYST ASPIRATION Right    neg   BREAST SURGERY     benign biopsy   CHOLECYSTECTOMY     COLONOSCOPY WITH PROPOFOL N/A 10/11/2019   Procedure: COLONOSCOPY WITH PROPOFOL;  Surgeon: Wyline Mood, MD;  Location: [facility] ENDOSCOPY;  Service: Gastroenterology;  Laterality: N/A;   COLONOSCOPY WITH PROPOFOL N/A 06/26/2021   Procedure: COLONOSCOPY WITH PROPOFOL;  Surgeon: Wyline Mood, MD;  Location: [facility] ENDOSCOPY;  Service: Gastroenterology;  Laterality: N/A;   DILATION AND CURETTAGE OF UTERUS     LAPAROSCOPIC LYSIS OF ADHESIONS  12/10/2015   Procedure: LAPAROSCOPIC LYSIS OF ADHESIONS;  Surgeon: Herold Harms, MD;  Location: [facility] ORS;  Service: Gynecology;;   LAPAROSCOPIC VAGINAL HYSTERECTOMY WITH SALPINGO OOPHORECTOMY Bilateral 01/28/2016   Procedure: LAPAROSCOPIC ASSISTED VAGINAL HYSTERECTOMY WITH SALPINGO OOPHORECTOMY;  Surgeon: Herold Harms, MD;  Location: [facility] ORS;  Service: Gynecology;  Laterality: Bilateral;   LAPAROSCOPY N/A 12/10/2015   Procedure: LAPAROSCOPY DIAGNOSTIC WITH BIOPSIES;  Surgeon:  Herold Harms, MD;  Location: [facility] ORS;  Service: Gynecology;  Laterality: N/A;   SHOULDER ARTHROSCOPY Right    TUBAL LIGATION     UTERINE FIBROID EMBOLIZATION      Family History  Problem Relation Age of Onset   Cancer Mother        cervical, lung   Lung cancer Mother    Heart disease Father    Hypertension Father    COPD Father    Breast cancer Maternal Aunt    Ovarian cancer Maternal Aunt    Spina bifida Sister    Diabetes Son    Diabetes Maternal Grandmother    Heart disease Maternal Grandmother    Parkinson's disease Paternal Grandfather     Social History   Socioeconomic History   Marital status: Married    Spouse name: John    Number of children: 2   Years of education: Not on file   Highest education level: Some college, no degree  Occupational History   Occupation: Production designer, theatre/television/film  Tobacco Use   Smoking status: Every Day    Packs/day: 1.00    Years: 37.00    Additional pack years: 0.00    Total pack years: 37.00    Types: Cigarettes    Start date: 10/06/1987   Smokeless tobacco: Never  Vaping Use   Vaping Use: Never used  Substance and Sexual Activity   Alcohol use: Yes    Alcohol/week: 0.0 standard drinks of alcohol    Comment: none last 24hr   Drug use: No   Sexual activity: Yes    Partners: Male    Birth control/protection: Surgical  Other Topics Concern   Not on file  Social History Narrative   Married   Works full time   Has 2 grown children    Social Determinants of Health   Financial Resource Strain: Low Risk  (06/18/2022)   Overall Financial Resource Strain (CARDIA)    Difficulty of Paying Living Expenses: Not hard at all  Food Insecurity: No Food Insecurity (06/18/2022)   Hunger Vital Sign    Worried About Running Out of  Food in the Last Year: Never true    Ran Out of Food in the Last Year: Never true  Transportation Needs: No Transportation Needs (06/18/2022)   PRAPARE - Administrator, Civil Service (Medical): No    Lack of  Transportation (Non-Medical): No  Physical Activity: Inactive (06/18/2022)   Exercise Vital Sign    Days of Exercise per Week: 0 days    Minutes of Exercise per Session: 0 min  Stress: No Stress Concern Present (06/18/2022)   [employer] of Occupational Health - Occupational Stress Questionnaire    Feeling of Stress : Only a little  Social Connections: Moderately Isolated (06/18/2022)   Social Connection and Isolation Panel [NHANES]    Frequency of Communication with Friends and Family: More than three times a week    Frequency of Social Gatherings with Friends and Family: Once a week    Attends Religious Services: Never    Database administrator or Organizations: No    Attends Banker Meetings: Never    Marital Status: Married  Catering manager Violence: Not At Risk (06/18/2022)   Humiliation, Afraid, Rape, and Kick questionnaire    Fear of Current or Ex-Partner: No    Emotionally Abused: No    Physically Abused: No    Sexually Abused: No     Current Outpatient Medications:    albuterol (VENTOLIN HFA) 108 (90 Base) MCG/ACT inhaler, Inhale 2 puffs into the lungs every 4 (four) hours as needed for wheezing or shortness of breath., Disp: 8.5 each, Rfl: 1   cetirizine (ZYRTEC) 10 MG tablet, Take 10 mg by mouth daily., Disp: , Rfl:    Cyanocobalamin (VITAMIN B-12) 500 MCG SUBL, Place 1 tablet (500 mcg total) under the tongue daily at 12 noon., Disp: 100 tablet, Rfl: 1   FLUoxetine (PROZAC) 40 MG capsule, TAKE 1 CAPSULE BY MOUTH EVERY DAY, Disp: 90 capsule, Rfl: 1   fluticasone (FLONASE) 50 MCG/ACT nasal spray, SPRAY 2 SPRAYS INTO EACH NOSTRIL EVERY DAY, Disp: 48 mL, Rfl: 0   gabapentin (NEURONTIN) 300 MG capsule, Take 1 capsule (300 mg total) by mouth at bedtime., Disp: 90 capsule, Rfl: 1   hydrOXYzine (ATARAX) 10 MG tablet, Take 1-2 tablets (10-20 mg total) by mouth 3 (three) times daily as needed., Disp: 30 tablet, Rfl: 0   metoprolol succinate (TOPROL-XL) 50 MG 24 hr  tablet, Take 1 tablet (50 mg total) by mouth daily. TAKE WITH OR IMMEDIATELY FOLLOWING A MEAL., Disp: 90 tablet, Rfl: 0   Olmesartan-amLODIPine-HCTZ (TRIBENZOR) 40-5-25 MG TABS, Take 1 tablet by mouth daily at 12 noon., Disp: 90 tablet, Rfl: 0   rosuvastatin (CRESTOR) 40 MG tablet, Take 1 tablet (40 mg total) by mouth daily., Disp: 90 tablet, Rfl: 1   Vitamin D, Cholecalciferol, 50 MCG (2000 UT) CAPS, Take by mouth daily., Disp: , Rfl:   Allergies  Allergen Reactions   Hydroxychloroquine Itching    Pt take zyrtec to alleviate symptoms    Penicillin G Hives    Has patient had a PCN reaction causing immediate rash, facial/tongue/throat swelling, SOB or lightheadedness with hypotension:unsure Has patient had a PCN reaction causing severe rash involving mucus membranes or skin necrosis:unsure Has patient had a PCN reaction that required hospitalization:unsure Has patient had a PCN reaction occurring within the last 10 years:No If all of the above answers are "NO", then may proceed with Cephalosporin use.      ROS  Constitutional: Negative for fever or weight change.  Respiratory: Negative  for cough and shortness of breath.   Cardiovascular: Negative for chest pain or palpitations.  Gastrointestinal: Negative for abdominal pain, no bowel changes.  Musculoskeletal: positive  for gait problem ( wearing a boot ) but no  joint swelling.  Skin: Negative for rash.  Neurological: Negative for dizziness or headache.  No other specific complaints in a complete review of systems (except as listed in HPI above).   Objective  Vitals:   06/18/22 0746  BP: 118/68  Pulse: 83  Resp: 16  SpO2: 96%  Weight: 172 lb (78 kg)  Height: 5\' 2"  (1.575 m)    Body mass index is 31.46 kg/m.  Physical Exam  Constitutional: Patient appears well-developed and well-nourished. No distress.  HENT: Head: Normocephalic and atraumatic. Ears: B TMs ok, no erythema or effusion; Nose: Nose normal. Mouth/Throat:  Oropharynx is clear and moist. No oropharyngeal exudate.  Eyes: Conjunctivae and EOM are normal. Pupils are equal, round, and reactive to light. No scleral icterus.  Neck: Normal range of motion. Neck supple. No JVD present. No thyromegaly present.  Cardiovascular: Normal rate, regular rhythm and normal heart sounds.  No murmur heard. No BLE edema. Pulmonary/Chest: Effort normal and breath sounds normal. No respiratory distress. Abdominal: Soft. Bowel sounds are normal, no distension. There is no tenderness. no masses Breast: no lumps or masses, no nipple discharge or rashes FEMALE GENITALIA:  Not done  RECTAL: not done  Musculoskeletal: Normal range of motion, no joint effusions. No gross deformities Neurological: he is alert and oriented to person, place, and time. No cranial nerve deficit. Coordination, balance, strength, speech and gait are normal.  Skin: Skin is warm and dry. No rash noted. No erythema.  Psychiatric: Patient has a normal mood and affect. behavior is normal. Judgment and thought content normal.   Fall Risk:    06/18/2022    7:44 AM 03/28/2022    8:56 AM 12/30/2021    8:51 AM 11/26/2021    8:38 AM 10/09/2021    2:27 PM  Fall Risk   Falls in the past year? 1 0 0 0 0  Number falls in past yr: 0 0 0    Injury with Fall? 1 0 0    Risk for fall due to : No Fall Risks No Fall Risks     Follow up Falls prevention discussed Falls prevention discussed  Falls prevention discussed;Education provided;Falls evaluation completed      Functional Status Survey: Is the patient deaf or have difficulty hearing?: No Does the patient have difficulty seeing, even when wearing glasses/contacts?: No Does the patient have difficulty concentrating, remembering, or making decisions?: No Does the patient have difficulty walking or climbing stairs?: Yes Does the patient have difficulty dressing or bathing?: No Does the patient have difficulty doing errands alone such as visiting a doctor's  office or shopping?: No   Assessment & Plan  1. Well adult exam  - DG Bone Density; Future - MM 3D SCREENING MAMMOGRAM BILATERAL BREAST; Future - Lipid panel - COMPLETE METABOLIC PANEL WITH GFR - CBC with Differential/Platelet - Hemoglobin A1c - Vitamin B12 - VITAMIN D 25 Hydroxy (Vit-D Deficiency, Fractures) - TSH - Parathyroid hormone, intact (no Ca)  2. History of foot fracture  - TSH - Parathyroid hormone, intact (no Ca)  3. Osteoporosis screening  - DG Bone Density; Future  4. Post-menopause   5. Breast cancer screening by mammogram  - MM 3D SCREENING MAMMOGRAM BILATERAL BREAST; Future  6. Vitamin D deficiency  - VITAMIN D  25 Hydroxy (Vit-D Deficiency, Fractures)  7. Vitamin B12 deficiency  - Vitamin B12  8. Hyperglycemia  - Hemoglobin A1c  9. Long-term use of high-risk medication  - COMPLETE METABOLIC PANEL WITH GFR - CBC with Differential/Platelet  10. Thoracic aorta atherosclerosis (HCC)  - Lipid panel    -USPSTF grade A and B recommendations reviewed with patient; age-appropriate recommendations, preventive care, screening tests, etc discussed and encouraged; healthy living encouraged; see AVS for patient education given to patient -Discussed importance of 150 minutes of physical activity weekly, eat two servings of fish weekly, eat one serving of tree nuts ( cashews, pistachios, pecans, almonds.Marland Kitchen) every other day, eat 6 servings of fruit/vegetables daily and drink plenty of water and avoid sweet beverages.   -Reviewed Health Maintenance: Yes.

## 2022-06-18 ENCOUNTER — Other Ambulatory Visit: Payer: Self-pay

## 2022-06-18 ENCOUNTER — Encounter: Payer: Self-pay | Admitting: Family Medicine

## 2022-06-18 ENCOUNTER — Ambulatory Visit (INDEPENDENT_AMBULATORY_CARE_PROVIDER_SITE_OTHER): Payer: No Typology Code available for payment source | Admitting: Family Medicine

## 2022-06-18 VITALS — BP 118/68 | HR 83 | Resp 16 | Ht 62.0 in | Wt 172.0 lb

## 2022-06-18 DIAGNOSIS — Z780 Asymptomatic menopausal state: Secondary | ICD-10-CM

## 2022-06-18 DIAGNOSIS — Z0000 Encounter for general adult medical examination without abnormal findings: Secondary | ICD-10-CM

## 2022-06-18 DIAGNOSIS — Z79899 Other long term (current) drug therapy: Secondary | ICD-10-CM

## 2022-06-18 DIAGNOSIS — Z8781 Personal history of (healed) traumatic fracture: Secondary | ICD-10-CM

## 2022-06-18 DIAGNOSIS — E559 Vitamin D deficiency, unspecified: Secondary | ICD-10-CM

## 2022-06-18 DIAGNOSIS — I10 Essential (primary) hypertension: Secondary | ICD-10-CM

## 2022-06-18 DIAGNOSIS — E538 Deficiency of other specified B group vitamins: Secondary | ICD-10-CM

## 2022-06-18 DIAGNOSIS — Z1231 Encounter for screening mammogram for malignant neoplasm of breast: Secondary | ICD-10-CM

## 2022-06-18 DIAGNOSIS — R739 Hyperglycemia, unspecified: Secondary | ICD-10-CM

## 2022-06-18 DIAGNOSIS — Z13820 Encounter for screening for osteoporosis: Secondary | ICD-10-CM

## 2022-06-18 DIAGNOSIS — I700 Atherosclerosis of aorta: Secondary | ICD-10-CM

## 2022-06-18 MED ORDER — OLMESARTAN-AMLODIPINE-HCTZ 40-5-25 MG PO TABS: 1.0000 | ORAL_TABLET | Freq: Every day | ORAL | 0 refills | Status: DC

## 2022-06-19 LAB — COMPLETE METABOLIC PANEL WITH GFR
AG Ratio: 1.5 (calc) (ref 1.0–2.5)
ALT: 26 U/L (ref 6–29)
AST: 24 U/L (ref 10–35)
Albumin: 4.3 g/dL (ref 3.6–5.1)
Alkaline phosphatase (APISO): 104 U/L (ref 37–153)
BUN: 25 mg/dL (ref 7–25)
CO2: 24 mmol/L (ref 20–32)
Calcium: 10.7 mg/dL — ABNORMAL HIGH (ref 8.6–10.4)
Chloride: 100 mmol/L (ref 98–110)
Creat: 0.81 mg/dL (ref 0.50–1.03)
Globulin: 2.9 g/dL (calc) (ref 1.9–3.7)
Glucose, Bld: 106 mg/dL — ABNORMAL HIGH (ref 65–99)
Potassium: 5.1 mmol/L (ref 3.5–5.3)
Sodium: 136 mmol/L (ref 135–146)
Total Bilirubin: 0.3 mg/dL (ref 0.2–1.2)
Total Protein: 7.2 g/dL (ref 6.1–8.1)
eGFR: 84 mL/min/{1.73_m2} (ref >=60)

## 2022-06-19 LAB — HEMOGLOBIN A1C
Hgb A1c MFr Bld: 6.1 % of total Hgb — ABNORMAL HIGH (ref <5.7)
Mean Plasma Glucose: 128 mg/dL
eAG (mmol/L): 7.1 mmol/L

## 2022-06-19 LAB — TSH: TSH: 2.31 mIU/L (ref 0.40–4.50)

## 2022-06-19 LAB — CBC WITH DIFFERENTIAL/PLATELET
Absolute Monocytes: 853 cells/uL (ref 200–950)
Basophils Absolute: 65 cells/uL (ref 0–200)
Basophils Relative: 0.6 %
Eosinophils Absolute: 335 cells/uL (ref 15–500)
Eosinophils Relative: 3.1 %
HCT: 43.3 % (ref 35.0–45.0)
Hemoglobin: 14.5 g/dL (ref 11.7–15.5)
Lymphs Abs: 1858 cells/uL (ref 850–3900)
MCH: 30.6 pg (ref 27.0–33.0)
MCHC: 33.5 g/dL (ref 32.0–36.0)
MCV: 91.4 fL (ref 80.0–100.0)
MPV: 9.4 fL (ref 7.5–12.5)
Monocytes Relative: 7.9 %
Neutro Abs: 7690 cells/uL (ref 1500–7800)
Neutrophils Relative %: 71.2 %
Platelets: 362 10*3/uL (ref 140–400)
RBC: 4.74 10*6/uL (ref 3.80–5.10)
RDW: 12.8 % (ref 11.0–15.0)
Total Lymphocyte: 17.2 %
WBC: 10.8 10*3/uL (ref 3.8–10.8)

## 2022-06-19 LAB — LIPID PANEL
Cholesterol: 135 mg/dL (ref <200)
HDL: 58 mg/dL (ref >=50)
LDL Cholesterol (Calc): 54 mg/dL (calc)
Non-HDL Cholesterol (Calc): 77 mg/dL (calc) (ref <130)
Total CHOL/HDL Ratio: 2.3 (calc) (ref <5.0)
Triglycerides: 145 mg/dL (ref <150)

## 2022-06-19 LAB — VITAMIN B12: Vitamin B-12: 616 pg/mL (ref 200–1100)

## 2022-06-19 LAB — VITAMIN D 25 HYDROXY (VIT D DEFICIENCY, FRACTURES): Vit D, 25-Hydroxy: 42 ng/mL (ref 30–100)

## 2022-06-19 LAB — PARATHYROID HORMONE, INTACT (NO CA): PTH: 31 pg/mL (ref 16–77)

## 2022-07-23 ENCOUNTER — Ambulatory Visit: Payer: Self-pay | Admitting: *Deleted

## 2022-07-23 NOTE — Telephone Encounter (Signed)
  Chief Complaint: weakness/ fatigue low BP Symptoms: dizziness with quick movements. Weakness feeling, very tired by end of work day but able to complete shift. Feels "sluggish". BP running 109-112/62-65 since starting new BP med tribenzor daily. Patient at work and unable to get BP monitor to check BP while on call with [employer].  Frequency: couple of weeks  Pertinent Negatives: Patient denies chest pain no worsening breathing issues hx COPD. No episode of fainting or passing out.  Disposition: [] ED /[] Urgent Care (no appt availability in office) / [x] Appointment(In office/virtual)/ []  [facility]/ [] Home Care/ [] Refused Recommended Disposition /[] [facility]/ []  Follow-up with PCP Additional Notes:   Appt scheduled tomorrow.     Reason for Disposition  Taking a medicine that could cause weakness (e.g., blood pressure medications, diuretics)  Answer Assessment - Initial Assessment Questions 1. BLOOD PRESSURE: "What is the blood pressure?" "Did you take at least two measurements 5 minutes apart?"     Unable to check  2. ONSET: "When did you take your blood pressure?"     At home patient now at work 3. HOW: "How did you obtain the blood pressure?" (e.g., visiting nurse, automatic home BP monitor)     BP monitor at her work did not work 4. HISTORY: "Do you have a history of low blood pressure?" "What is your blood pressure normally?"     Yes  5. MEDICINES: "Are you taking any medications for blood pressure?" If Yes, ask: "Have they been changed recently?"     Yes 6. PULSE RATE: "Do you know what your pulse rate is?"      na 7. OTHER SYMPTOMS: "Have you been sick recently?" "Have you had a recent injury?"     Na  8. PREGNANCY: "Is there any chance you are pregnant?" "When was your last menstrual period?"     na  Answer Assessment - Initial Assessment Questions 1. DESCRIPTION: "Describe how you are feeling."     Weakness and fatigue dizziness with quick movements 2.  SEVERITY: "How bad is it?"  "Can you stand and walk?"   - MILD (0-3): Feels weak or tired, but does not interfere with work, school or normal activities.   - MODERATE (4-7): Able to stand and walk; weakness interferes with work, school, or normal activities.   - SEVERE (8-10): Unable to stand or walk; unable to do usual activities.     Mild but does feel very fatigued by end of working  3. ONSET: "When did these symptoms begin?" (e.g., hours, days, weeks, months)     Mild but does feel very fatigue end of day working  4. CAUSE: "What do you think is causing the weakness or fatigue?" (e.g., not drinking enough fluids, medical problem, trouble sleeping)     Not sure possible medication change 5. NEW MEDICINES:  "Have you started on any new medicines recently?" (e.g., opioid pain medicines, benzodiazepines, muscle relaxants, antidepressants, antihistamines, neuroleptics, beta blockers)     BP medication increased 6. OTHER SYMPTOMS: "Do you have any other symptoms?" (e.g., chest pain, fever, cough, SOB, vomiting, diarrhea, bleeding, other areas of pain)     Hx COPD. Breathing issues at times none significant now  7. PREGNANCY: "Is there any chance you are pregnant?" "When was your last menstrual period?"     na  Protocols used: Blood Pressure - Low-A-AH, Weakness (Generalized) and Fatigue-A-AH

## 2022-07-23 NOTE — Progress Notes (Signed)
Name: Nichole Wells   MRN: [id number]    DOB: 24-Jan-1961   Date:07/24/2022       Progress Note  Subjective  Chief Complaint  Weakness/ Fatigue  HPI  Fatigue: patient states she has not been feeling well for the past 2-3 weeks, just feeling drained, lack of energy, sometimes SOB with activity, her cough is stable and chronic. No fever or chills. Denies change in appetite, dysuria, hematuria or change in bowel movements. Reviewed recent labs done during her CPE in May   She had a tooth pulled about one month ago but well healed and took clindamycin - no facial pain or pressure  HTN: she used to take Tribenzor 40/10/12.5 mg but had some lower extremity edema so we changed dose in January for 40/5/25 mg daily , BP has been low but only over the past few weeks . No dizziness but feels tired and has some SOB with activity   Reminded her to get screening tests done and we will check other labs today  History of hematuria: during a visit to urgent care, we will recheck it today   Patient Active Problem List   Diagnosis Date Noted   Heart palpitations 10/23/2021   Lumbosacral facet hypertrophy ( L3-4, L4-5, and L5-S1) (Bilateral) 10/08/2021    Class: Chronic   Perennial allergic rhinitis 08/26/2021   Essential hypertension 07/24/2021   Vitamin B12 deficiency 07/24/2021   Abnormal MRI, lumbar spine (06/03/2014 & 9/9 /2023) 06/13/2021   Low back pain of over 3 months duration 05/16/2021   Spondylosis without myelopathy or radiculopathy, lumbosacral region 05/16/2021   DDD (degenerative disc disease), cervical (Multilevel) 01/14/2021   Cervical facet arthropathy (Multilevel) (Bilateral) 01/14/2021   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 01/14/2021   Retrolisthesis of lumbar spine at L1/L2 and L2/L3 01/14/2021   Lumbosacral facet arthropathy (Multilevel) (Bilateral) 01/14/2021   Osteoarthritis of hips (Bilateral) 01/14/2021   Osteoarthritis of wrist (Right) 01/14/2021    Osteoarthritis of first carpometacarpal joint of hand (Right) 01/14/2021   Chronic pain syndrome 11/14/2020   Disorder of skeletal system 11/14/2020   Problems influencing health status 11/14/2020   Chronic low back pain (1ry area of Pain) (Bilateral) (L>R) w/o sciatica 11/14/2020    Class: Chronic   Lumbosacral facet syndrome (Bilateral) 11/14/2020   Chronic wrist pain (2ry area of Pain) (Right) 11/14/2020   History of carpal tunnel surgery of wrist (Right) 11/14/2020   Chronic neck pain (3ry area of Pain) (Bilateral) (L>R) 11/14/2020   Cervicalgia 11/14/2020   Cervical facet syndrome 11/14/2020   Chronic hip pain (4th area of Pain) (Bilateral) 11/14/2020   Recurrent major depressive disorder, in remission (HCC) 02/18/2018   Thoracic aorta atherosclerosis (HCC) 11/10/2017   Centrilobular emphysema (HCC) 11/10/2017   Abnormal liver CT 11/10/2017   Seronegative inflammatory arthritis 05/07/2017   Tobacco use 05/07/2017   Vitamin D deficiency 05/28/2016   Surgical menopause on hormone replacement therapy 04/23/2016   Status post laparoscopic assisted vaginal hysterectomy (LAVH) 01/28/2016   Family history of ovarian cancer 11/13/2015   Insomnia 03/27/2015   Uncontrolled hypertension 08/30/2014   Anxiety and depression 08/30/2014   Cardiac murmur 08/30/2014   HLD (hyperlipidemia) 08/30/2014   DDD (degenerative disc disease), lumbar 07/26/2014    Past Surgical History:  Procedure Laterality Date   BREAST BIOPSY Left    stereo- neg   BREAST CYST ASPIRATION Right    neg   BREAST SURGERY     benign biopsy   CHOLECYSTECTOMY  COLONOSCOPY WITH PROPOFOL N/A 10/11/2019   Procedure: COLONOSCOPY WITH PROPOFOL;  Surgeon: Wyline Mood, MD;  Location: [facility] ENDOSCOPY;  Service: Gastroenterology;  Laterality: N/A;   COLONOSCOPY WITH PROPOFOL N/A 06/26/2021   Procedure: COLONOSCOPY WITH PROPOFOL;  Surgeon: Wyline Mood, MD;  Location: [facility] ENDOSCOPY;  Service: Gastroenterology;  Laterality:  N/A;   DILATION AND CURETTAGE OF UTERUS     LAPAROSCOPIC LYSIS OF ADHESIONS  12/10/2015   Procedure: LAPAROSCOPIC LYSIS OF ADHESIONS;  Surgeon: Herold Harms, MD;  Location: [facility] ORS;  Service: Gynecology;;   LAPAROSCOPIC VAGINAL HYSTERECTOMY WITH SALPINGO OOPHORECTOMY Bilateral 01/28/2016   Procedure: LAPAROSCOPIC ASSISTED VAGINAL HYSTERECTOMY WITH SALPINGO OOPHORECTOMY;  Surgeon: Herold Harms, MD;  Location: [facility] ORS;  Service: Gynecology;  Laterality: Bilateral;   LAPAROSCOPY N/A 12/10/2015   Procedure: LAPAROSCOPY DIAGNOSTIC WITH BIOPSIES;  Surgeon: Herold Harms, MD;  Location: [facility] ORS;  Service: Gynecology;  Laterality: N/A;   SHOULDER ARTHROSCOPY Right    TUBAL LIGATION     UTERINE FIBROID EMBOLIZATION      Family History  Problem Relation Age of Onset   Cancer Mother        cervical, lung   Lung cancer Mother    Heart disease Father    Hypertension Father    COPD Father    Breast cancer Maternal Aunt    Ovarian cancer Maternal Aunt    Spina bifida Sister    Diabetes Son    Diabetes Maternal Grandmother    Heart disease Maternal Grandmother    Parkinson's disease Paternal Grandfather     Social History   Tobacco Use   Smoking status: Every Day    Packs/day: 1.00    Years: 37.00    Additional pack years: 0.00    Total pack years: 37.00    Types: Cigarettes    Start date: 10/06/1987   Smokeless tobacco: Never  Substance Use Topics   Alcohol use: Yes    Alcohol/week: 0.0 standard drinks of alcohol    Comment: none last 24hr     Current Outpatient Medications:    albuterol (VENTOLIN HFA) 108 (90 Base) MCG/ACT inhaler, Inhale 2 puffs into the lungs every 4 (four) hours as needed for wheezing or shortness of breath., Disp: 8.5 each, Rfl: 1   cetirizine (ZYRTEC) 10 MG tablet, Take 10 mg by mouth daily., Disp: , Rfl:    Cyanocobalamin (VITAMIN B-12) 500 MCG SUBL, Place 1 tablet (500 mcg total) under the tongue daily at 12 noon., Disp: 100  tablet, Rfl: 1   FLUoxetine (PROZAC) 40 MG capsule, TAKE 1 CAPSULE BY MOUTH EVERY DAY, Disp: 90 capsule, Rfl: 1   fluticasone (FLONASE) 50 MCG/ACT nasal spray, SPRAY 2 SPRAYS INTO EACH NOSTRIL EVERY DAY, Disp: 48 mL, Rfl: 0   gabapentin (NEURONTIN) 300 MG capsule, Take 1 capsule (300 mg total) by mouth at bedtime., Disp: 90 capsule, Rfl: 1   hydrOXYzine (ATARAX) 10 MG tablet, Take 1-2 tablets (10-20 mg total) by mouth 3 (three) times daily as needed., Disp: 30 tablet, Rfl: 0   metoprolol succinate (TOPROL-XL) 50 MG 24 hr tablet, Take 1 tablet (50 mg total) by mouth daily. TAKE WITH OR IMMEDIATELY FOLLOWING A MEAL., Disp: 90 tablet, Rfl: 0   Olmesartan-amLODIPine-HCTZ (TRIBENZOR) 40-5-25 MG TABS, Take 1 tablet by mouth daily at 12 noon., Disp: 90 tablet, Rfl: 0   rosuvastatin (CRESTOR) 40 MG tablet, Take 1 tablet (40 mg total) by mouth daily., Disp: 90 tablet, Rfl: 1   Vitamin D, Cholecalciferol, 50 MCG (2000  UT) CAPS, Take by mouth daily., Disp: , Rfl:   Allergies  Allergen Reactions   Hydroxychloroquine Itching    Pt take zyrtec to alleviate symptoms    Penicillin G Hives    Has patient had a PCN reaction causing immediate rash, facial/tongue/throat swelling, SOB or lightheadedness with hypotension:unsure Has patient had a PCN reaction causing severe rash involving mucus membranes or skin necrosis:unsure Has patient had a PCN reaction that required hospitalization:unsure Has patient had a PCN reaction occurring within the last 10 years:No If all of the above answers are "NO", then may proceed with Cephalosporin use.     I personally reviewed active problem list, medication list, allergies, family history, social history, health maintenance with the patient/caregiver today.   ROS  Ten systems reviewed and is negative except as mentioned in HPI   Objective  Vitals:   07/24/22 0734  BP: 108/66  Pulse: 71  Resp: 16  Temp: 98 F (36.7 C)  TempSrc: Oral  SpO2: 98%  Weight: 169 lb  (76.7 kg)  Height: 5\' 2"  (1.575 m)    Body mass index is 30.91 kg/m.  Physical Exam  Constitutional: Patient appears well-developed and well-nourished. Obese  No distress.  HEENT: head atraumatic, normocephalic, pupils equal and reactive to light, neck supple Cardiovascular: Normal rate, regular rhythm and normal heart sounds.  No murmur heard. No BLE edema. Pulmonary/Chest: Effort normal and breath sounds normal. No respiratory distress. Abdominal: Soft.  There is no tenderness. Psychiatric: Patient has a normal mood and affect. behavior is normal. Judgment and thought content normal.    PHQ2/9:    07/24/2022    7:34 AM 06/18/2022    7:44 AM 03/28/2022    8:56 AM 12/30/2021    8:51 AM 11/26/2021    8:39 AM  Depression screen PHQ 2/9  Decreased Interest 0 0 0 0 0  Down, Depressed, Hopeless 0 0 0 0 0  PHQ - 2 Score 0 0 0 0 0  Altered sleeping  0 0 0 0  Tired, decreased energy  0 0 0 0  Change in appetite  0 0 0 0  Feeling bad or failure about yourself   0 0 0 0  Trouble concentrating  0 0 0 0  Moving slowly or fidgety/restless  0 0 0 0  Suicidal thoughts  0 0 0 0  PHQ-9 Score  0 0 0 0  Difficult doing work/chores    Not difficult at all     phq 9 is negative   Fall Risk:    07/24/2022    7:34 AM 06/18/2022    7:44 AM 03/28/2022    8:56 AM 12/30/2021    8:51 AM 11/26/2021    8:38 AM  Fall Risk   Falls in the past year? 1 1 0 0 0  Number falls in past yr: 0 0 0 0   Injury with Fall? 1 1 0 0   Risk for fall due to : No Fall Risks No Fall Risks No Fall Risks    Follow up Falls prevention discussed Falls prevention discussed Falls prevention discussed  Falls prevention discussed;Education provided;Falls evaluation completed      Functional Status Survey: Is the patient deaf or have difficulty hearing?: No Does the patient have difficulty seeing, even when wearing glasses/contacts?: No Does the patient have difficulty concentrating, remembering, or making decisions?:  No Does the patient have difficulty walking or climbing stairs?: Yes Does the patient have difficulty dressing or bathing?: No Does the  patient have difficulty doing errands alone such as visiting a doctor's office or shopping?: No    Assessment & Plan  1. Other fatigue  - CBC with Differential/Platelet - COMPLETE METABOLIC PANEL WITH GFR - Cortisol - Sedimentation rate - C-reactive protein  2. Blood pressure lower than prior measurement  - CBC with Differential/Platelet - COMPLETE METABOLIC PANEL WITH GFR - Cortisol  3. Chronic cough  - Ambulatory Referral Lung Cancer Screening [facility]  4. Tobacco use  - Ambulatory Referral Lung Cancer Screening [facility]  5. History of hematuria  - Urine Culture - Urinalysis, Complete  6. Hypertension, benign  - Olmesartan-amLODIPine-HCTZ 40-5-12.5 MG TABS; Take 1 tablet by mouth daily at 12 noon.  Dispense: 30 tablet; Refill: 0  7. Dyspnea on exertion   We will adjust bp  medication first but if symptoms persists we will refer her to cardiologist

## 2022-07-24 ENCOUNTER — Encounter: Payer: Self-pay | Admitting: Family Medicine

## 2022-07-24 ENCOUNTER — Ambulatory Visit: Payer: No Typology Code available for payment source | Admitting: Family Medicine

## 2022-07-24 VITALS — BP 108/66 | HR 71 | Temp 98.0°F | Resp 16 | Ht 62.0 in | Wt 169.0 lb

## 2022-07-24 DIAGNOSIS — R5383 Other fatigue: Secondary | ICD-10-CM | POA: Diagnosis not present

## 2022-07-24 DIAGNOSIS — Z720 Tobacco use: Secondary | ICD-10-CM

## 2022-07-24 DIAGNOSIS — R0609 Other forms of dyspnea: Secondary | ICD-10-CM

## 2022-07-24 DIAGNOSIS — R031 Nonspecific low blood-pressure reading: Secondary | ICD-10-CM

## 2022-07-24 DIAGNOSIS — I10 Essential (primary) hypertension: Secondary | ICD-10-CM

## 2022-07-24 DIAGNOSIS — R053 Chronic cough: Secondary | ICD-10-CM | POA: Diagnosis not present

## 2022-07-24 DIAGNOSIS — Z87448 Personal history of other diseases of urinary system: Secondary | ICD-10-CM

## 2022-07-24 MED ORDER — OLMESARTAN-AMLODIPINE-HCTZ 40-5-12.5 MG PO TABS: 1.0000 | ORAL_TABLET | Freq: Every day | ORAL | 0 refills | Status: DC

## 2022-07-25 ENCOUNTER — Other Ambulatory Visit: Payer: Self-pay

## 2022-07-25 LAB — COMPLETE METABOLIC PANEL WITH GFR
AG Ratio: 1.6 (calc) (ref 1.0–2.5)
ALT: 48 U/L — ABNORMAL HIGH (ref 6–29)
AST: 44 U/L — ABNORMAL HIGH (ref 10–35)
Albumin: 4.5 g/dL (ref 3.6–5.1)
Alkaline phosphatase (APISO): 94 U/L (ref 37–153)
BUN/Creatinine Ratio: 31 (calc) — ABNORMAL HIGH (ref 6–22)
BUN: 36 mg/dL — ABNORMAL HIGH (ref 7–25)
CO2: 26 mmol/L (ref 20–32)
Calcium: 10.6 mg/dL — ABNORMAL HIGH (ref 8.6–10.4)
Chloride: 101 mmol/L (ref 98–110)
Creat: 1.16 mg/dL — ABNORMAL HIGH (ref 0.50–1.03)
Globulin: 2.8 g/dL (calc) (ref 1.9–3.7)
Glucose, Bld: 95 mg/dL (ref 65–99)
Potassium: 4.5 mmol/L (ref 3.5–5.3)
Sodium: 137 mmol/L (ref 135–146)
Total Bilirubin: 0.5 mg/dL (ref 0.2–1.2)
Total Protein: 7.3 g/dL (ref 6.1–8.1)
eGFR: 54 mL/min/{1.73_m2} — ABNORMAL LOW (ref >=60)

## 2022-07-25 LAB — URINE CULTURE
MICRO NUMBER:: 15079958
Result:: NO GROWTH
SPECIMEN QUALITY:: ADEQUATE

## 2022-07-25 LAB — CBC WITH DIFFERENTIAL/PLATELET
Absolute Monocytes: 595 cells/uL (ref 200–950)
Basophils Absolute: 56 cells/uL (ref 0–200)
Basophils Relative: 0.6 %
Eosinophils Absolute: 307 cells/uL (ref 15–500)
Eosinophils Relative: 3.3 %
HCT: 39.9 % (ref 35.0–45.0)
Hemoglobin: 13.4 g/dL (ref 11.7–15.5)
Lymphs Abs: 1776 cells/uL (ref 850–3900)
MCH: 30.9 pg (ref 27.0–33.0)
MCHC: 33.6 g/dL (ref 32.0–36.0)
MCV: 92.1 fL (ref 80.0–100.0)
MPV: 9.3 fL (ref 7.5–12.5)
Monocytes Relative: 6.4 %
Neutro Abs: 6566 cells/uL (ref 1500–7800)
Neutrophils Relative %: 70.6 %
Platelets: 347 10*3/uL (ref 140–400)
RBC: 4.33 10*6/uL (ref 3.80–5.10)
RDW: 12.5 % (ref 11.0–15.0)
Total Lymphocyte: 19.1 %
WBC: 9.3 10*3/uL (ref 3.8–10.8)

## 2022-07-25 LAB — URINALYSIS, COMPLETE
Bacteria, UA: NONE SEEN /HPF
Bilirubin Urine: NEGATIVE
Glucose, UA: NEGATIVE
Ketones, ur: NEGATIVE
Leukocytes,Ua: NEGATIVE
Nitrite: NEGATIVE
RBC / HPF: NONE SEEN /HPF (ref 0–2)
Specific Gravity, Urine: 1.013 (ref 1.001–1.035)
WBC, UA: NONE SEEN /HPF (ref 0–5)
pH: 5.5 (ref 5.0–8.0)

## 2022-07-25 LAB — CORTISOL: Cortisol, Plasma: 21.8 ug/dL

## 2022-07-25 LAB — SEDIMENTATION RATE: Sed Rate: 29 mm/h (ref 0–30)

## 2022-07-25 LAB — C-REACTIVE PROTEIN: CRP: <3 mg/L (ref <8.0)

## 2022-07-28 ENCOUNTER — Other Ambulatory Visit: Payer: Self-pay | Admitting: Family Medicine

## 2022-07-28 DIAGNOSIS — R899 Unspecified abnormal finding in specimens from other organs, systems and tissues: Secondary | ICD-10-CM

## 2022-08-04 LAB — COMPLETE METABOLIC PANEL WITH GFR
AG Ratio: 1.8 (calc) (ref 1.0–2.5)
ALT: 38 U/L — ABNORMAL HIGH (ref 6–29)
AST: 28 U/L (ref 10–35)
Albumin: 4.3 g/dL (ref 3.6–5.1)
Alkaline phosphatase (APISO): 94 U/L (ref 37–153)
BUN/Creatinine Ratio: 31 (calc) — ABNORMAL HIGH (ref 6–22)
BUN: 36 mg/dL — ABNORMAL HIGH (ref 7–25)
CO2: 25 mmol/L (ref 20–32)
Calcium: 10.1 mg/dL (ref 8.6–10.4)
Chloride: 104 mmol/L (ref 98–110)
Creat: 1.15 mg/dL — ABNORMAL HIGH (ref 0.50–1.03)
Globulin: 2.4 g/dL (calc) (ref 1.9–3.7)
Glucose, Bld: 105 mg/dL — ABNORMAL HIGH (ref 65–99)
Potassium: 4.6 mmol/L (ref 3.5–5.3)
Sodium: 136 mmol/L (ref 135–146)
Total Bilirubin: 0.4 mg/dL (ref 0.2–1.2)
Total Protein: 6.7 g/dL (ref 6.1–8.1)
eGFR: 55 mL/min/{1.73_m2} — ABNORMAL LOW (ref >=60)

## 2022-08-04 LAB — PTH, INTACT AND CALCIUM
Calcium: 10.1 mg/dL (ref 8.6–10.4)
PTH: 73 pg/mL (ref 16–77)

## 2022-08-15 ENCOUNTER — Other Ambulatory Visit: Payer: Self-pay | Admitting: Family Medicine

## 2022-08-15 DIAGNOSIS — I10 Essential (primary) hypertension: Secondary | ICD-10-CM

## 2022-08-16 ENCOUNTER — Other Ambulatory Visit: Payer: Self-pay | Admitting: Family Medicine

## 2022-08-16 DIAGNOSIS — I10 Essential (primary) hypertension: Secondary | ICD-10-CM

## 2022-08-17 ENCOUNTER — Other Ambulatory Visit: Payer: Self-pay | Admitting: Family Medicine

## 2022-08-17 DIAGNOSIS — J3089 Other allergic rhinitis: Secondary | ICD-10-CM

## 2022-08-25 NOTE — Progress Notes (Unsigned)
Name: Nichole Wells   MRN: [id number]    DOB: 30-May-1960   Date:08/26/2022       Progress Note  Subjective  Chief Complaint  Follow Up  HPI  Emphysema with COPD  : she is unable to afford Trelegy or Brezti, I am sending Spiriva to her pharmacy today   She has a history of smoking 1 pack daily for over 30 years. She did not have her lung cancer screening, smoking about 8 cigarettes per day and has intermittent cough but no sob . We will also switch from albuterol to Airsupra    Seronegative Inflammatory arthritis: she used to see  Dr. Gavin Potters , she has been off Methotrexate for over 2 years, he has stiffness and pain on both hands and lately also noticing some swelling on her fingers, feeling stiff when she wakes up and has aching pain on multiple joints . She still has Raynaud's . Reminded her again to go back to Rheumatologist  Chronic low back pain :she stopped going to the pain clinic, insurance was not paying for procedures, she had PT, she still has daily pain and at times more intense, she is on 300 mg at night and it helps with nocturnal pain   HTN: Taking Tribenzor and toprol and bp is at goal , however she does not like the fact that she has been swelling on hands, explained likely from inflammatory arthritis we decreased the dose from 40/10/25 to 40/5/25 however she developed fatigue, was dehydrated based on elevated BUN and raise in GFR we now decreased again to 40/5/12.5  and she is feeling better, bp is 132/78, we will continue current dose   Hyperlipidemia/Aorta atherosclerosis:Atherosclerosis found on CT chest. Last LDL was 121 but she is now taking crestor and LDL is at goal at 54 , no side effects    Major Depression: she was diagnosed in her 65's, she has been taking Prozac for many years, tried other medication in the past. Initially seen by psychiatrist and had psychotherapy.  Still in remission and doing well. She does not want to stop medication because she had severe  episode that required hospitalization in her 48's for suicide thoughts. She is doing well on current regiment, she never started wellbutrin but is feeling well   Hyperglycemia: she denies polyphagia, polydipsia or polyuria. last A1C was normal at 5.5 % , last visit up to 6.1 % , reminded her of diabetic diet    Patient Active Problem List   Diagnosis Date Noted   Heart palpitations 10/23/2021   Lumbosacral facet hypertrophy ( L3-4, L4-5, and L5-S1) (Bilateral) 10/08/2021    Class: Chronic   Perennial allergic rhinitis 08/26/2021   Essential hypertension 07/24/2021   Vitamin B12 deficiency 07/24/2021   Abnormal MRI, lumbar spine (06/03/2014 & 9/9 /2023) 06/13/2021   Low back pain of over 3 months duration 05/16/2021   Spondylosis without myelopathy or radiculopathy, lumbosacral region 05/16/2021   DDD (degenerative disc disease), cervical (Multilevel) 01/14/2021   Cervical facet arthropathy (Multilevel) (Bilateral) 01/14/2021   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 01/14/2021   Retrolisthesis of lumbar spine at L1/L2 and L2/L3 01/14/2021   Lumbosacral facet arthropathy (Multilevel) (Bilateral) 01/14/2021   Osteoarthritis of hips (Bilateral) 01/14/2021   Osteoarthritis of wrist (Right) 01/14/2021   Osteoarthritis of first carpometacarpal joint of hand (Right) 01/14/2021   Chronic pain syndrome 11/14/2020   Disorder of skeletal system 11/14/2020   Problems influencing health status 11/14/2020   Chronic low back pain (  1ry area of Pain) (Bilateral) (L>R) w/o sciatica 11/14/2020    Class: Chronic   Lumbosacral facet syndrome (Bilateral) 11/14/2020   Chronic wrist pain (2ry area of Pain) (Right) 11/14/2020   History of carpal tunnel surgery of wrist (Right) 11/14/2020   Chronic neck pain (3ry area of Pain) (Bilateral) (L>R) 11/14/2020   Cervicalgia 11/14/2020   Cervical facet syndrome 11/14/2020   Chronic hip pain (4th area of Pain) (Bilateral) 11/14/2020   Recurrent major  depressive disorder, in remission (HCC) 02/18/2018   Thoracic aorta atherosclerosis (HCC) 11/10/2017   Centrilobular emphysema (HCC) 11/10/2017   Abnormal liver CT 11/10/2017   Seronegative inflammatory arthritis 05/07/2017   Tobacco use 05/07/2017   Vitamin D deficiency 05/28/2016   Surgical menopause on hormone replacement therapy 04/23/2016   Status post laparoscopic assisted vaginal hysterectomy (LAVH) 01/28/2016   Family history of ovarian cancer 11/13/2015   Insomnia 03/27/2015   Uncontrolled hypertension 08/30/2014   Anxiety and depression 08/30/2014   Cardiac murmur 08/30/2014   HLD (hyperlipidemia) 08/30/2014   DDD (degenerative disc disease), lumbar 07/26/2014    Past Surgical History:  Procedure Laterality Date   BREAST BIOPSY Left    stereo- neg   BREAST CYST ASPIRATION Right    neg   BREAST SURGERY     benign biopsy   CHOLECYSTECTOMY     COLONOSCOPY WITH PROPOFOL N/A 10/11/2019   Procedure: COLONOSCOPY WITH PROPOFOL;  Surgeon: Wyline Mood, MD;  Location[facility] ENDOSCOPY;  Service: Gastroenterology;  Laterality: N/A;   COLONOSCOPY WITH PROPOFOL N/A 06/26/2021   Procedure: COLONOSCOPY WITH PROPOFOL;  Surgeon: Wyline Mood, MD;  Location: [facility] ENDOSCOPY;  Service: Gastroenterology;  Laterality: N/A;   DILATION AND CURETTAGE OF UTERUS     LAPAROSCOPIC LYSIS OF ADHESIONS  12/10/2015   Procedure: LAPAROSCOPIC LYSIS OF ADHESIONS;  Surgeon: Herold Harms, MD;  Location: [facility] ORS;  Service: Gynecology;;   LAPAROSCOPIC VAGINAL HYSTERECTOMY WITH SALPINGO OOPHORECTOMY Bilateral 01/28/2016   Procedure: LAPAROSCOPIC ASSISTED VAGINAL HYSTERECTOMY WITH SALPINGO OOPHORECTOMY;  Surgeon: Herold Harms, MD;  Location: [facility] ORS;  Service: Gynecology;  Laterality: Bilateral;   LAPAROSCOPY N/A 12/10/2015   Procedure: LAPAROSCOPY DIAGNOSTIC WITH BIOPSIES;  Surgeon: Herold Harms, MD;  Location: [facility] ORS;  Service: Gynecology;  Laterality: N/A;   SHOULDER ARTHROSCOPY Right     TUBAL LIGATION     UTERINE FIBROID EMBOLIZATION      Family History  Problem Relation Age of Onset   Cancer Mother        cervical, lung   Lung cancer Mother    Heart disease Father    Hypertension Father    COPD Father    Breast cancer Maternal Aunt    Ovarian cancer Maternal Aunt    Spina bifida Sister    Diabetes Son    Diabetes Maternal Grandmother    Heart disease Maternal Grandmother    Parkinson's disease Paternal Grandfather     Social History   Tobacco Use   Smoking status: Every Day    Current packs/day: 1.00    Average packs/day: 1 pack/day for 37.0 years (37.0 ttl pk-yrs)    Types: Cigarettes    Start date: 10/06/1987   Smokeless tobacco: Never  Substance Use Topics   Alcohol use: Yes    Alcohol/week: 0.0 standard drinks of alcohol    Comment: none last 24hr     Current Outpatient Medications:    albuterol (VENTOLIN HFA) 108 (90 Base) MCG/ACT inhaler, Inhale 2 puffs into the lungs every 4 (four) hours as  needed for wheezing or shortness of breath., Disp: 8.5 each, Rfl: 1   cetirizine (ZYRTEC) 10 MG tablet, Take 10 mg by mouth daily., Disp: , Rfl:    Cyanocobalamin (VITAMIN B-12) 500 MCG SUBL, Place 1 tablet (500 mcg total) under the tongue daily at 12 noon., Disp: 100 tablet, Rfl: 1   FLUoxetine (PROZAC) 40 MG capsule, TAKE 1 CAPSULE BY MOUTH EVERY DAY, Disp: 90 capsule, Rfl: 1   fluticasone (FLONASE) 50 MCG/ACT nasal spray, SPRAY 2 SPRAYS INTO EACH NOSTRIL EVERY DAY, Disp: 48 mL, Rfl: 0   gabapentin (NEURONTIN) 300 MG capsule, Take 1 capsule (300 mg total) by mouth at bedtime., Disp: 90 capsule, Rfl: 1   hydrOXYzine (ATARAX) 10 MG tablet, Take 1-2 tablets (10-20 mg total) by mouth 3 (three) times daily as needed., Disp: 30 tablet, Rfl: 0   metoprolol succinate (TOPROL-XL) 50 MG 24 hr tablet, TAKE 1 TABLET BY MOUTH DAILY. TAKE WITH OR IMMEDIATELY FOLLOWING A MEAL., Disp: 90 tablet, Rfl: 0   Olmesartan-amLODIPine-HCTZ 40-5-12.5 MG TABS, TAKE 1 TABLET BY  MOUTH DAILY AT 12 NOON., Disp: 90 tablet, Rfl: 0   rosuvastatin (CRESTOR) 40 MG tablet, Take 1 tablet (40 mg total) by mouth daily., Disp: 90 tablet, Rfl: 1   Vitamin D, Cholecalciferol, 50 MCG (2000 UT) CAPS, Take by mouth daily., Disp: , Rfl:   Allergies  Allergen Reactions   Hydroxychloroquine Itching    Pt take zyrtec to alleviate symptoms    Penicillin G Hives    Has patient had a PCN reaction causing immediate rash, facial/tongue/throat swelling, SOB or lightheadedness with hypotension:unsure Has patient had a PCN reaction causing severe rash involving mucus membranes or skin necrosis:unsure Has patient had a PCN reaction that required hospitalization:unsure Has patient had a PCN reaction occurring within the last 10 years:No If all of the above answers are "NO", then may proceed with Cephalosporin use.     I personally reviewed active problem list, medication list, allergies, family history, social history, health maintenance with the patient/caregiver today.   ROS  Constitutional: Negative for fever or weight change.  Respiratory: Negative for cough and shortness of breath.   Cardiovascular: Negative for chest pain or palpitations.  Gastrointestinal: Negative for abdominal pain, no bowel changes.  Musculoskeletal: Negative for gait problem or joint swelling.  Skin: Negative for rash.  Neurological: Negative for dizziness or headache.  No other specific complaints in a complete review of systems (except as listed in HPI above).   Objective  Vitals:   08/26/22 0801  BP: 132/78  Pulse: 72  Resp: 16  SpO2: 98%  Weight: 173 lb (78.5 kg)  Height: 5\' 2"  (1.575 m)    Body mass index is 31.64 kg/m.  Physical Exam  Constitutional: Patient appears well-developed and well-nourished. Obese  No distress.  HEENT: head atraumatic, normocephalic, pupils equal and reactive to light,, neck supple Cardiovascular: Normal rate, regular rhythm and normal heart sounds.  No murmur  heard. No BLE edema. Pulmonary/Chest: Effort normal and breath sounds normal. No respiratory distress. Abdominal: Soft.  There is no tenderness. Muscular skeletal: mild swelling on medial right wrist, some swelling of fingers also  Psychiatric: Patient has a normal mood and affect. behavior is normal. Judgment and thought content normal.    PHQ2/9:    08/26/2022    8:01 AM 07/24/2022    7:34 AM 06/18/2022    7:44 AM 03/28/2022    8:56 AM 12/30/2021    8:51 AM  Depression screen PHQ 2/9  Decreased  Interest 0 0 0 0 0  Down, Depressed, Hopeless 0 0 0 0 0  PHQ - 2 Score 0 0 0 0 0  Altered sleeping 0  0 0 0  Tired, decreased energy 0  0 0 0  Change in appetite 0  0 0 0  Feeling bad or failure about yourself  0  0 0 0  Trouble concentrating 0  0 0 0  Moving slowly or fidgety/restless 0  0 0 0  Suicidal thoughts 0  0 0 0  PHQ-9 Score 0  0 0 0  Difficult doing work/chores     Not difficult at all    phq 9 is negative   Fall Risk:    08/26/2022    8:01 AM 07/24/2022    7:34 AM 06/18/2022    7:44 AM 03/28/2022    8:56 AM 12/30/2021    8:51 AM  Fall Risk   Falls in the past year? 1 1 1  0 0  Number falls in past yr: 0 0 0 0 0  Injury with Fall? 1 1 1  0 0  Risk for fall due to : No Fall Risks No Fall Risks No Fall Risks No Fall Risks   Follow up Falls prevention discussed Falls prevention discussed Falls prevention discussed Falls prevention discussed       Functional Status Survey: Is the patient deaf or have difficulty hearing?: No Does the patient have difficulty seeing, even when wearing glasses/contacts?: No Does the patient have difficulty concentrating, remembering, or making decisions?: No Does the patient have difficulty walking or climbing stairs?: Yes Does the patient have difficulty dressing or bathing?: No Does the patient have difficulty doing errands alone such as visiting a doctor's office or shopping?: No    Assessment & Plan  1. Centrilobular emphysema  (HCC)  - tiotropium (SPIRIVA HANDIHALER) 18 MCG inhalation capsule; Place 1 capsule (18 mcg total) into inhaler and inhale daily.  Dispense: 90 capsule; Refill: 1 - Albuterol-Budesonide (AIRSUPRA) 90-80 MCG/ACT AERO; Inhale 2 puffs into the lungs 4 (four) times daily as needed.  Dispense: 10.7 g; Refill: 2  2. Thoracic aorta atherosclerosis (HCC)  - rosuvastatin (CRESTOR) 40 MG tablet; Take 1 tablet (40 mg total) by mouth daily.  Dispense: 90 tablet; Refill: 0  3. Recurrent major depressive disorder, in remission (HCC)  - FLUoxetine (PROZAC) 40 MG capsule; TAKE 1 CAPSULE BY MOUTH EVERY DAY  Dispense: 90 capsule; Refill: 0  4. DDD (degenerative disc disease), lumbar  - gabapentin (NEURONTIN) 300 MG capsule; Take 1 capsule (300 mg total) by mouth at bedtime.  Dispense: 90 capsule; Refill: 0  5. Chronic left-sided low back pain without sciatica  - gabapentin (NEURONTIN) 300 MG capsule; Take 1 capsule (300 mg total) by mouth at bedtime.  Dispense: 90 capsule; Refill: 0  6. Essential hypertension  - BASIC METABOLIC PANEL WITH GFR - metoprolol succinate (TOPROL-XL) 50 MG 24 hr tablet; Take 1 tablet (50 mg total) by mouth daily. TAKE WITH OR IMMEDIATELY FOLLOWING A MEAL.  Dispense: 90 tablet; Refill: 0  7. Anxiety with flying  - hydrOXYzine (ATARAX) 10 MG tablet; Take 1-2 tablets (10-20 mg total) by mouth 3 (three) times daily as needed.  Dispense: 30 tablet; Refill: 0  8. Hypertension, benign  - Olmesartan-amLODIPine-HCTZ 40-5-12.5 MG TABS; Take 1 tablet by mouth daily at 12 noon.  Dispense: 90 tablet; Refill: 0  9. Pure hypercholesterolemia  - rosuvastatin (CRESTOR) 40 MG tablet; Take 1 tablet (40 mg total) by mouth daily.  Dispense: 90 tablet; Refill: 0  10. Vitamin B12 deficiency   11. Seronegative inflammatory arthritis  - Ambulatory referral to Rheumatology

## 2022-08-26 ENCOUNTER — Encounter: Payer: Self-pay | Admitting: Family Medicine

## 2022-08-26 ENCOUNTER — Ambulatory Visit: Payer: No Typology Code available for payment source | Admitting: Family Medicine

## 2022-08-26 VITALS — BP 132/78 | HR 72 | Resp 16 | Ht 62.0 in | Wt 173.0 lb

## 2022-08-26 DIAGNOSIS — I700 Atherosclerosis of aorta: Secondary | ICD-10-CM | POA: Diagnosis not present

## 2022-08-26 DIAGNOSIS — M545 Low back pain, unspecified: Secondary | ICD-10-CM

## 2022-08-26 DIAGNOSIS — M5136 Other intervertebral disc degeneration, lumbar region: Secondary | ICD-10-CM

## 2022-08-26 DIAGNOSIS — I10 Essential (primary) hypertension: Secondary | ICD-10-CM

## 2022-08-26 DIAGNOSIS — G8929 Other chronic pain: Secondary | ICD-10-CM

## 2022-08-26 DIAGNOSIS — J432 Centrilobular emphysema: Secondary | ICD-10-CM

## 2022-08-26 DIAGNOSIS — F40243 Fear of flying: Secondary | ICD-10-CM

## 2022-08-26 DIAGNOSIS — E7800 Pure hypercholesterolemia, unspecified: Secondary | ICD-10-CM

## 2022-08-26 DIAGNOSIS — E538 Deficiency of other specified B group vitamins: Secondary | ICD-10-CM

## 2022-08-26 DIAGNOSIS — F3340 Major depressive disorder, recurrent, in remission, unspecified: Secondary | ICD-10-CM | POA: Diagnosis not present

## 2022-08-26 DIAGNOSIS — M1380 Other specified arthritis, unspecified site: Secondary | ICD-10-CM

## 2022-08-26 MED ORDER — OLMESARTAN-AMLODIPINE-HCTZ 40-5-12.5 MG PO TABS
1.0000 | ORAL_TABLET | Freq: Every day | ORAL | 0 refills | Status: DC
Start: 2022-08-26 — End: 2023-01-20

## 2022-08-26 MED ORDER — HYDROXYZINE HCL 10 MG PO TABS
10.0000 mg | ORAL_TABLET | Freq: Three times a day (TID) | ORAL | 0 refills | Status: DC | PRN
Start: 2022-08-26 — End: 2023-01-27

## 2022-08-26 MED ORDER — AIRSUPRA 90-80 MCG/ACT IN AERO
2.0000 | INHALATION_SPRAY | Freq: Four times a day (QID) | RESPIRATORY_TRACT | 2 refills | Status: AC | PRN
Start: 2022-08-26 — End: ?

## 2022-08-26 MED ORDER — GABAPENTIN 300 MG PO CAPS
300.0000 mg | ORAL_CAPSULE | Freq: Every day | ORAL | 0 refills | Status: DC
Start: 2022-08-26 — End: 2023-01-27

## 2022-08-26 MED ORDER — FLUOXETINE HCL 40 MG PO CAPS
ORAL_CAPSULE | ORAL | 0 refills | Status: DC
Start: 2022-08-26 — End: 2023-01-27

## 2022-08-26 MED ORDER — ROSUVASTATIN CALCIUM 40 MG PO TABS: 40.0000 mg | ORAL_TABLET | Freq: Every day | ORAL | 0 refills | Status: DC

## 2022-08-26 MED ORDER — METOPROLOL SUCCINATE ER 50 MG PO TB24: 50.0000 mg | ORAL_TABLET | Freq: Every day | ORAL | 0 refills | Status: DC

## 2022-08-26 MED ORDER — TIOTROPIUM BROMIDE MONOHYDRATE 18 MCG IN CAPS
18.0000 ug | ORAL_CAPSULE | Freq: Every day | RESPIRATORY_TRACT | 1 refills | Status: DC
Start: 2022-08-26 — End: 2023-01-27

## 2022-08-27 LAB — BASIC METABOLIC PANEL WITH GFR
BUN: 20 mg/dL (ref 7–25)
CO2: 28 mmol/L (ref 20–32)
Calcium: 10.1 mg/dL (ref 8.6–10.4)
Chloride: 105 mmol/L (ref 98–110)
Creat: 0.68 mg/dL (ref 0.50–1.03)
Glucose, Bld: 93 mg/dL (ref 65–99)
Potassium: 4.3 mmol/L (ref 3.5–5.3)
Sodium: 138 mmol/L (ref 135–146)
eGFR: 100 mL/min/{1.73_m2} (ref >=60)

## 2022-09-08 ENCOUNTER — Ambulatory Visit: Payer: No Typology Code available for payment source | Admitting: Family Medicine

## 2022-09-08 VITALS — BP 128/74 | HR 76 | Temp 98.1°F | Resp 16 | Ht 62.0 in | Wt 174.0 lb

## 2022-09-08 DIAGNOSIS — J432 Centrilobular emphysema: Secondary | ICD-10-CM

## 2022-09-08 DIAGNOSIS — J0100 Acute maxillary sinusitis, unspecified: Secondary | ICD-10-CM | POA: Diagnosis not present

## 2022-09-08 MED ORDER — AZITHROMYCIN 500 MG PO TABS: 500.0000 mg | ORAL_TABLET | Freq: Every day | ORAL | 0 refills | Status: DC

## 2022-09-08 MED ORDER — PREDNISONE 10 MG PO TABS: 10.0000 mg | ORAL_TABLET | Freq: Two times a day (BID) | ORAL | 0 refills | Status: DC

## 2022-09-08 NOTE — Progress Notes (Signed)
Name: Nichole Wells   MRN: [id number]    DOB: 19-Mar-1960   Date:09/08/2022       Progress Note  Subjective  Chief Complaint  Sinusitis  HPI  Sinus pressure: she noticed frontal headache 5 days ago, nasal congestion, some post nasal drainage, fatigue, poor appetite, mild cough, no sore throat but as noticed some SOB ( she has emphysema ), no fever but had some chills. She did one covid home test that was negative    Patient Active Problem List   Diagnosis Date Noted   Heart palpitations 10/23/2021   Lumbosacral facet hypertrophy ( L3-4, L4-5, and L5-S1) (Bilateral) 10/08/2021    Class: Chronic   Perennial allergic rhinitis 08/26/2021   Essential hypertension 07/24/2021   Vitamin B12 deficiency 07/24/2021   Abnormal MRI, lumbar spine (06/03/2014 & 9/9 /2023) 06/13/2021   Low back pain of over 3 months duration 05/16/2021   Spondylosis without myelopathy or radiculopathy, lumbosacral region 05/16/2021   DDD (degenerative disc disease), cervical (Multilevel) 01/14/2021   Cervical facet arthropathy (Multilevel) (Bilateral) 01/14/2021   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 01/14/2021   Retrolisthesis of lumbar spine at L1/L2 and L2/L3 01/14/2021   Lumbosacral facet arthropathy (Multilevel) (Bilateral) 01/14/2021   Osteoarthritis of hips (Bilateral) 01/14/2021   Osteoarthritis of wrist (Right) 01/14/2021   Osteoarthritis of first carpometacarpal joint of hand (Right) 01/14/2021   Chronic pain syndrome 11/14/2020   Disorder of skeletal system 11/14/2020   Problems influencing health status 11/14/2020   Chronic low back pain (1ry area of Pain) (Bilateral) (L>R) w/o sciatica 11/14/2020    Class: Chronic   Lumbosacral facet syndrome (Bilateral) 11/14/2020   Chronic wrist pain (2ry area of Pain) (Right) 11/14/2020   History of carpal tunnel surgery of wrist (Right) 11/14/2020   Chronic neck pain (3ry area of Pain) (Bilateral) (L>R) 11/14/2020   Cervicalgia 11/14/2020    Cervical facet syndrome 11/14/2020   Chronic hip pain (4th area of Pain) (Bilateral) 11/14/2020   Recurrent major depressive disorder, in remission (HCC) 02/18/2018   Thoracic aorta atherosclerosis (HCC) 11/10/2017   Centrilobular emphysema (HCC) 11/10/2017   Abnormal liver CT 11/10/2017   Seronegative inflammatory arthritis 05/07/2017   Tobacco use 05/07/2017   Vitamin D deficiency 05/28/2016   Surgical menopause on hormone replacement therapy 04/23/2016   Status post laparoscopic assisted vaginal hysterectomy (LAVH) 01/28/2016   Family history of ovarian cancer 11/13/2015   Insomnia 03/27/2015   Uncontrolled hypertension 08/30/2014   Anxiety and depression 08/30/2014   Cardiac murmur 08/30/2014   HLD (hyperlipidemia) 08/30/2014   DDD (degenerative disc disease), lumbar 07/26/2014    Past Surgical History:  Procedure Laterality Date   BREAST BIOPSY Left    stereo- neg   BREAST CYST ASPIRATION Right    neg   BREAST SURGERY     benign biopsy   CHOLECYSTECTOMY     COLONOSCOPY WITH PROPOFOL N/A 10/11/2019   Procedure: COLONOSCOPY WITH PROPOFOL;  Surgeon: Wyline Mood, MD;  Location: [facility] ENDOSCOPY;  Service: Gastroenterology;  Laterality: N/A;   COLONOSCOPY WITH PROPOFOL N/A 06/26/2021   Procedure: COLONOSCOPY WITH PROPOFOL;  Surgeon: Wyline Mood, MD;  Location[facility] ENDOSCOPY;  Service: Gastroenterology;  Laterality: N/A;   DILATION AND CURETTAGE OF UTERUS     LAPAROSCOPIC LYSIS OF ADHESIONS  12/10/2015   Procedure: LAPAROSCOPIC LYSIS OF ADHESIONS;  Surgeon: Herold Harms, MD;  Location: [facility] ORS;  Service: Gynecology;;   LAPAROSCOPIC VAGINAL HYSTERECTOMY WITH SALPINGO OOPHORECTOMY Bilateral 01/28/2016   Procedure: LAPAROSCOPIC ASSISTED VAGINAL HYSTERECTOMY  WITH SALPINGO OOPHORECTOMY;  Surgeon: Herold Harms, MD;  Location: [facility] ORS;  Service: Gynecology;  Laterality: Bilateral;   LAPAROSCOPY N/A 12/10/2015   Procedure: LAPAROSCOPY DIAGNOSTIC WITH BIOPSIES;  Surgeon:  Herold Harms, MD;  Location: [facility] ORS;  Service: Gynecology;  Laterality: N/A;   SHOULDER ARTHROSCOPY Right    TUBAL LIGATION     UTERINE FIBROID EMBOLIZATION      Family History  Problem Relation Age of Onset   Cancer Mother        cervical, lung   Lung cancer Mother    Heart disease Father    Hypertension Father    COPD Father    Breast cancer Maternal Aunt    Ovarian cancer Maternal Aunt    Spina bifida Sister    Diabetes Son    Diabetes Maternal Grandmother    Heart disease Maternal Grandmother    Parkinson's disease Paternal Grandfather     Social History   Tobacco Use   Smoking status: Every Day    Current packs/day: 1.00    Average packs/day: 1 pack/day for 37.0 years (37.0 ttl pk-yrs)    Types: Cigarettes    Start date: 10/06/1987   Smokeless tobacco: Never  Substance Use Topics   Alcohol use: Yes    Alcohol/week: 0.0 standard drinks of alcohol    Comment: none last 24hr     Current Outpatient Medications:    Albuterol-Budesonide (AIRSUPRA) 90-80 MCG/ACT AERO, Inhale 2 puffs into the lungs 4 (four) times daily as needed., Disp: 10.7 g, Rfl: 2   cetirizine (ZYRTEC) 10 MG tablet, Take 10 mg by mouth daily., Disp: , Rfl:    Cyanocobalamin (VITAMIN B-12) 500 MCG SUBL, Place 1 tablet (500 mcg total) under the tongue daily at 12 noon., Disp: 100 tablet, Rfl: 1   FLUoxetine (PROZAC) 40 MG capsule, TAKE 1 CAPSULE BY MOUTH EVERY DAY, Disp: 90 capsule, Rfl: 0   fluticasone (FLONASE) 50 MCG/ACT nasal spray, SPRAY 2 SPRAYS INTO EACH NOSTRIL EVERY DAY, Disp: 48 mL, Rfl: 0   gabapentin (NEURONTIN) 300 MG capsule, Take 1 capsule (300 mg total) by mouth at bedtime., Disp: 90 capsule, Rfl: 0   hydrOXYzine (ATARAX) 10 MG tablet, Take 1-2 tablets (10-20 mg total) by mouth 3 (three) times daily as needed., Disp: 30 tablet, Rfl: 0   metoprolol succinate (TOPROL-XL) 50 MG 24 hr tablet, Take 1 tablet (50 mg total) by mouth daily. TAKE WITH OR IMMEDIATELY FOLLOWING A MEAL.,  Disp: 90 tablet, Rfl: 0   Olmesartan-amLODIPine-HCTZ 40-5-12.5 MG TABS, Take 1 tablet by mouth daily at 12 noon., Disp: 90 tablet, Rfl: 0   rosuvastatin (CRESTOR) 40 MG tablet, Take 1 tablet (40 mg total) by mouth daily., Disp: 90 tablet, Rfl: 0   tiotropium (SPIRIVA HANDIHALER) 18 MCG inhalation capsule, Place 1 capsule (18 mcg total) into inhaler and inhale daily., Disp: 90 capsule, Rfl: 1   Vitamin D, Cholecalciferol, 50 MCG (2000 UT) CAPS, Take by mouth daily., Disp: , Rfl:   Allergies  Allergen Reactions   Hydroxychloroquine Itching    Pt take zyrtec to alleviate symptoms    Penicillin G Hives    Has patient had a PCN reaction causing immediate rash, facial/tongue/throat swelling, SOB or lightheadedness with hypotension:unsure Has patient had a PCN reaction causing severe rash involving mucus membranes or skin necrosis:unsure Has patient had a PCN reaction that required hospitalization:unsure Has patient had a PCN reaction occurring within the last 10 years:No If all of the above answers are "NO", then  may proceed with Cephalosporin use.     I personally reviewed active problem list, medication list, allergies, family history, social history, health maintenance with the patient/caregiver today.   ROS  Ten systems reviewed and is negative except as mentioned in HPI    Objective  Vitals:   09/08/22 1443  BP: 128/74  Pulse: 76  Resp: 16  Temp: 98.1 F (36.7 C)  SpO2: 97%  Weight: 174 lb (78.9 kg)  Height: 5\' 2"  (1.575 m)    Body mass index is 31.83 kg/m.  Physical Exam  Constitutional: Patient appears well-developed and well-nourished. Obese No distress.  HEENT: head atraumatic, normocephalic, pupils equal and reactive to light, ears left TM is slightly opaque , neck supple, tender during percussion of frontal sinus , normal posterior pharynx  Cardiovascular: Normal rate, regular rhythm and normal heart sounds.  No murmur heard. No BLE edema. Pulmonary/Chest: Effort  normal , mild rhonchi. No respiratory distress. Abdominal: Soft.  There is no tenderness. Psychiatric: Patient has a normal mood and affect. behavior is normal. Judgment and thought content normal.    PHQ2/9:    09/08/2022    2:43 PM 08/26/2022    8:01 AM 07/24/2022    7:34 AM 06/18/2022    7:44 AM 03/28/2022    8:56 AM  Depression screen PHQ 2/9  Decreased Interest 0 0 0 0 0  Down, Depressed, Hopeless 0 0 0 0 0  PHQ - 2 Score 0 0 0 0 0  Altered sleeping 0 0  0 0  Tired, decreased energy 0 0  0 0  Change in appetite 0 0  0 0  Feeling bad or failure about yourself  0 0  0 0  Trouble concentrating 0 0  0 0  Moving slowly or fidgety/restless 0 0  0 0  Suicidal thoughts 0 0  0 0  PHQ-9 Score 0 0  0 0    phq 9 is negative   Fall Risk:    09/08/2022    2:42 PM 08/26/2022    8:01 AM 07/24/2022    7:34 AM 06/18/2022    7:44 AM 03/28/2022    8:56 AM  Fall Risk   Falls in the past year? 1 1 1 1  0  Number falls in past yr: 0 0 0 0 0  Injury with Fall? 1 1 1 1  0  Risk for fall due to : No Fall Risks No Fall Risks No Fall Risks No Fall Risks No Fall Risks  Follow up Falls prevention discussed Falls prevention discussed Falls prevention discussed Falls prevention discussed Falls prevention discussed      Functional Status Survey: Is the patient deaf or have difficulty hearing?: No Does the patient have difficulty seeing, even when wearing glasses/contacts?: No Does the patient have difficulty concentrating, remembering, or making decisions?: No Does the patient have difficulty walking or climbing stairs?: Yes Does the patient have difficulty dressing or bathing?: No Does the patient have difficulty doing errands alone such as visiting a doctor's office or shopping?: No    Assessment & Plan  1. Acute maxillary sinusitis, recurrence not specified  - azithromycin (ZITHROMAX) 500 MG tablet; Take 1 tablet (500 mg total) by mouth daily.  Dispense: 3 tablet; Refill: 0  2. Centrilobular  emphysema (HCC)  - predniSONE (DELTASONE) 10 MG tablet; Take 1 tablet (10 mg total) by mouth 2 (two) times daily with a meal.  Dispense: 10 tablet; Refill: 0

## 2022-09-09 ENCOUNTER — Ambulatory Visit: Payer: No Typology Code available for payment source | Admitting: Family Medicine

## 2022-09-09 ENCOUNTER — Other Ambulatory Visit: Payer: Self-pay

## 2022-09-09 DIAGNOSIS — F17210 Nicotine dependence, cigarettes, uncomplicated: Secondary | ICD-10-CM

## 2022-09-09 DIAGNOSIS — Z87891 Personal history of nicotine dependence: Secondary | ICD-10-CM

## 2022-09-18 ENCOUNTER — Ambulatory Visit
Admission: RE | Admit: 2022-09-18 | Discharge: 2022-09-18 | Disposition: A | Discharge status: Home / Self Care | Payer: No Typology Code available for payment source | Source: Ambulatory Visit | Attending: Family Medicine | Admitting: Family Medicine

## 2022-09-18 DIAGNOSIS — Z13820 Encounter for screening for osteoporosis: Secondary | ICD-10-CM | POA: Diagnosis present

## 2022-09-18 DIAGNOSIS — Z1231 Encounter for screening mammogram for malignant neoplasm of breast: Secondary | ICD-10-CM

## 2022-09-18 DIAGNOSIS — Z0000 Encounter for general adult medical examination without abnormal findings: Secondary | ICD-10-CM | POA: Insufficient documentation

## 2022-11-14 ENCOUNTER — Other Ambulatory Visit: Payer: Self-pay | Admitting: Family Medicine

## 2022-11-14 DIAGNOSIS — J3089 Other allergic rhinitis: Secondary | ICD-10-CM

## 2022-11-17 NOTE — Telephone Encounter (Signed)
Copied from CRM [id number]. Topic: General - Inquiry >> Nov 17, 2022 10:43 AM De Blanch wrote: Reason for CRM: Pt stated she called Rheumatology to schedule her appointment, and they advised her they don't have a referral and her PCP needs to resend it. Please advise.

## 2022-11-30 ENCOUNTER — Other Ambulatory Visit: Payer: Self-pay | Admitting: Family Medicine

## 2022-11-30 DIAGNOSIS — I700 Atherosclerosis of aorta: Secondary | ICD-10-CM

## 2022-11-30 DIAGNOSIS — E7800 Pure hypercholesterolemia, unspecified: Secondary | ICD-10-CM

## 2023-01-16 NOTE — Progress Notes (Signed)
Name: Nichole Wells   MRN: [id number]    DOB: Nov 08, 1960   Date:01/27/2023       Progress Note  Subjective  Chief Complaint  Chief Complaint  Patient presents with   Medical Management of Chronic Issues    HPI  The patient, with a history of seronegative inflammatory arthritis, depression, and emphysema, presents for a regular follow-up visit. She reports worsening arthritis symptoms, particularly in the hands and back, with increased stiffness and pain. 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 She has previously been treated with methotrexate but has not seen a rheumatologist in a couple of years.  The patient also reports a persistent cough with phlegm production throughout the day. However, she denies any current shortness of breath or wheezing, and feels that her emphysema symptoms are better managed with Spiriva ( only medication for COPD with low cost under her health insurance plan). She has a history of smoking, currently at a reduced rate of about seven to eight cigarettes a day.  In terms of her mental health, the patient has a long-standing history of depression, currently in remission, and experiences anxiety. She has been considering restarting Wellbutrin, both as an aid to quit smoking and to manage potential seasonal affective disorder symptoms, she takes hydroxizine prn anxiety or sleep.  The patient also mentions a recent upper respiratory infection treated with prednisone and azithromycin, which has since resolved. She has been taking Aleve for arthritis pain, but finds it insufficient. She also takes gabapentin at night for low back pain with  radiculitis, which has been effective. The patient is on a regimen of fluoxetine for depression, hydroxyzine for anxiety, and olmesartan, amlodipine, HCTZ, and metoprolol for blood pressure management. She reports no side effects from these  medications. She takes Crestor for dyslipidemia and denies side effects of medication    Patient Active Problem List   Diagnosis Date Noted   Heart palpitations 10/23/2021   Lumbosacral facet hypertrophy ( L3-4, L4-5, and L5-S1) (Bilateral) 10/08/2021    Class: Chronic   Perennial allergic rhinitis 08/26/2021   Essential hypertension 07/24/2021   Vitamin B12 deficiency 07/24/2021   Abnormal MRI, lumbar spine (06/03/2014 & 9/9 /2023) 06/13/2021   Low back pain of over 3 months duration 05/16/2021   Spondylosis without myelopathy or radiculopathy, lumbosacral region 05/16/2021   DDD (degenerative disc disease), cervical (Multilevel) 01/14/2021   Cervical facet arthropathy (Multilevel) (Bilateral) 01/14/2021   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 01/14/2021   Retrolisthesis of lumbar spine at L1/L2 and L2/L3 01/14/2021   Lumbosacral facet arthropathy (Multilevel) (Bilateral) 01/14/2021   Osteoarthritis of hips (Bilateral) 01/14/2021   Osteoarthritis of wrist (Right) 01/14/2021   Osteoarthritis of first carpometacarpal joint of hand (Right) 01/14/2021   Chronic pain syndrome 11/14/2020   Disorder of skeletal system 11/14/2020   Problems influencing health status 11/14/2020   Chronic low back pain (1ry area of Pain) (Bilateral) (L>R) w/o sciatica 11/14/2020    Class: Chronic   Lumbosacral facet syndrome (Bilateral) 11/14/2020   Chronic wrist pain (2ry area of Pain) (Right) 11/14/2020   History of carpal tunnel surgery of wrist (Right) 11/14/2020   Chronic neck pain (3ry area of Pain) (Bilateral) (L>R) 11/14/2020   Cervicalgia 11/14/2020   Cervical facet syndrome 11/14/2020   Chronic hip pain (4th area of Pain) (Bilateral) 11/14/2020   Recurrent major depressive disorder, in remission (HCC) 02/18/2018   Thoracic aorta atherosclerosis (HCC)  11/10/2017   Centrilobular emphysema (HCC) 11/10/2017   Abnormal liver CT 11/10/2017   Seronegative inflammatory arthritis  05/07/2017   Tobacco use 05/07/2017   Vitamin D deficiency 05/28/2016   Surgical menopause on hormone replacement therapy 04/23/2016   Status post laparoscopic assisted vaginal hysterectomy (LAVH) 01/28/2016   Family history of ovarian cancer 11/13/2015   Insomnia 03/27/2015   Uncontrolled hypertension 08/30/2014   Anxiety and depression 08/30/2014   Cardiac murmur 08/30/2014   HLD (hyperlipidemia) 08/30/2014   DDD (degenerative disc disease), lumbar 07/26/2014    Past Surgical History:  Procedure Laterality Date   BREAST BIOPSY Left    stereo- neg   BREAST CYST ASPIRATION Right    neg   BREAST SURGERY     benign biopsy   CHOLECYSTECTOMY     COLONOSCOPY WITH PROPOFOL N/A 10/11/2019   Procedure: COLONOSCOPY WITH PROPOFOL;  Surgeon: Wyline Mood, MD;  Location: [facility] ENDOSCOPY;  Service: Gastroenterology;  Laterality: N/A;   COLONOSCOPY WITH PROPOFOL N/A 06/26/2021   Procedure: COLONOSCOPY WITH PROPOFOL;  Surgeon: Wyline Mood, MD;  Location: [facility] ENDOSCOPY;  Service: Gastroenterology;  Laterality: N/A;   DILATION AND CURETTAGE OF UTERUS     LAPAROSCOPIC LYSIS OF ADHESIONS  12/10/2015   Procedure: LAPAROSCOPIC LYSIS OF ADHESIONS;  Surgeon: Herold Harms, MD;  Location: [facility] ORS;  Service: Gynecology;;   LAPAROSCOPIC VAGINAL HYSTERECTOMY WITH SALPINGO OOPHORECTOMY Bilateral 01/28/2016   Procedure: LAPAROSCOPIC ASSISTED VAGINAL HYSTERECTOMY WITH SALPINGO OOPHORECTOMY;  Surgeon: Herold Harms, MD;  Location: [facility] ORS;  Service: Gynecology;  Laterality: Bilateral;   LAPAROSCOPY N/A 12/10/2015   Procedure: LAPAROSCOPY DIAGNOSTIC WITH BIOPSIES;  Surgeon: Herold Harms, MD;  Location: [facility] ORS;  Service: Gynecology;  Laterality: N/A;   SHOULDER ARTHROSCOPY Right    TUBAL LIGATION     UTERINE FIBROID EMBOLIZATION      Family History  Problem Relation Age of Onset   Cancer Mother        cervical, lung   Lung cancer Mother    Heart disease Father    Hypertension  Father    COPD Father    Breast cancer Maternal Aunt    Ovarian cancer Maternal Aunt    Spina bifida Sister    Diabetes Son    Diabetes Maternal Grandmother    Heart disease Maternal Grandmother    Parkinson's disease Paternal Grandfather     Social History   Tobacco Use   Smoking status: Every Day    Current packs/day: 1.00    Average packs/day: 1 pack/day for 37.4 years (37.4 ttl pk-yrs)    Types: Cigarettes    Start date: 10/06/1987   Smokeless tobacco: Never  Substance Use Topics   Alcohol use: Yes    Alcohol/week: 0.0 standard drinks of alcohol    Comment: none last 24hr     Current Outpatient Medications:    Albuterol-Budesonide (AIRSUPRA) 90-80 MCG/ACT AERO, Inhale 2 puffs into the lungs 4 (four) times daily as needed., Disp: 10.7 g, Rfl: 2   cetirizine (ZYRTEC) 10 MG tablet, Take 10 mg by mouth daily., Disp: , Rfl:    Cyanocobalamin (VITAMIN B-12) 500 MCG SUBL, Place 1 tablet (500 mcg total) under the tongue daily at 12 noon., Disp: 100 tablet, Rfl: 1   fluticasone (FLONASE) 50 MCG/ACT nasal spray, SPRAY 2 SPRAYS INTO EACH NOSTRIL EVERY DAY, Disp: 48 mL, Rfl: 0   Vitamin D, Cholecalciferol, 50 MCG (2000 UT) CAPS, Take by mouth daily., Disp: , Rfl:    buPROPion (WELLBUTRIN XL) 150 MG  24 hr tablet, Take 1 tablet (150 mg total) by mouth daily., Disp: 90 tablet, Rfl: 1   celecoxib (CELEBREX) 200 MG capsule, Take 1 capsule (200 mg total) by mouth daily., Disp: 90 capsule, Rfl: 0   FLUoxetine (PROZAC) 40 MG capsule, TAKE 1 CAPSULE BY MOUTH EVERY DAY, Disp: 90 capsule, Rfl: 1   gabapentin (NEURONTIN) 300 MG capsule, Take 1 capsule (300 mg total) by mouth at bedtime., Disp: 90 capsule, Rfl: 1   hydrOXYzine (ATARAX) 10 MG tablet, Take 1-2 tablets (10-20 mg total) by mouth 3 (three) times daily as needed., Disp: 30 tablet, Rfl: 0   metoprolol succinate (TOPROL-XL) 50 MG 24 hr tablet, Take 1 tablet (50 mg total) by mouth daily. TAKE WITH OR IMMEDIATELY FOLLOWING A MEAL., Disp: 90  tablet, Rfl: 1   Olmesartan-amLODIPine-HCTZ 40-5-12.5 MG TABS, Take 1 tablet by mouth daily at 12 noon., Disp: 90 tablet, Rfl: 1   rosuvastatin (CRESTOR) 40 MG tablet, Take 1 tablet (40 mg total) by mouth daily., Disp: 90 tablet, Rfl: 1   tiotropium (SPIRIVA HANDIHALER) 18 MCG inhalation capsule, Place 1 capsule (18 mcg total) into inhaler and inhale daily., Disp: 90 capsule, Rfl: 1  Allergies  Allergen Reactions   Hydroxychloroquine Itching    Pt take zyrtec to alleviate symptoms    Penicillin G Hives    Has patient had a PCN reaction causing immediate rash, facial/tongue/throat swelling, SOB or lightheadedness with hypotension:unsure Has patient had a PCN reaction causing severe rash involving mucus membranes or skin necrosis:unsure Has patient had a PCN reaction that required hospitalization:unsure Has patient had a PCN reaction occurring within the last 10 years:No If all of the above answers are "NO", then may proceed with Cephalosporin use.     I personally reviewed active problem list, medication list, allergies, family history with the patient/caregiver today.   ROS  Constitutional: Negative for fever or weight change.  Respiratory: Negative for cough and shortness of breath.   Cardiovascular: Negative for chest pain or palpitations.  Gastrointestinal: Negative for abdominal pain, no bowel changes.  Musculoskeletal: Negative for gait problem or joint swelling.  Skin: Negative for rash.  Neurological: Negative for dizziness or headache.  No other specific complaints in a complete review of systems (except as listed in HPI above).   Objective  Vitals:   01/27/23 0833 01/27/23 0853  BP: (!) 142/80 132/76  Pulse: 68   Resp: 16   Temp: 98.1 F (36.7 C)   TempSrc: Oral   SpO2: 99%   Weight: 173 lb 4.8 oz (78.6 kg)   Height: 5\' 2"  (1.575 m)     Body mass index is 31.7 kg/m.  Physical Exam  Constitutional: Patient appears well-developed and well-nourished. Obese   No distress.  HEENT: head atraumatic, normocephalic, pupils equal and reactive to light, neck supple Cardiovascular: Normal rate, regular rhythm and normal heart sounds.  No murmur heard. No BLE edema. Pulmonary/Chest: Effort normal and breath sounds normal. No respiratory distress. Muscular skeletal:  Abdominal: Soft.  There is no tenderness. Psychiatric: Patient has a normal mood and affect. behavior is normal. Judgment and thought content normal.   PHQ2/9:    01/27/2023    8:31 AM 09/08/2022    2:43 PM 08/26/2022    8:01 AM 07/24/2022    7:34 AM 06/18/2022    7:44 AM  Depression screen PHQ 2/9  Decreased Interest 0 0 0 0 0  Down, Depressed, Hopeless 0 0 0 0 0  PHQ - 2 Score 0 0  0 0 0  Altered sleeping 0 0 0  0  Tired, decreased energy 0 0 0  0  Change in appetite 0 0 0  0  Feeling bad or failure about yourself  0 0 0  0  Trouble concentrating 0 0 0  0  Moving slowly or fidgety/restless 0 0 0  0  Suicidal thoughts 0 0 0  0  PHQ-9 Score 0 0 0  0  Difficult doing work/chores Not difficult at all        phq 9 is negative   Fall Risk:    01/27/2023    8:26 AM 09/08/2022    2:42 PM 08/26/2022    8:01 AM 07/24/2022    7:34 AM 06/18/2022    7:44 AM  Fall Risk   Falls in the past year? 1 1 1 1 1   Number falls in past yr: 0 0 0 0 0  Injury with Fall? 1 1 1 1 1   Risk for fall due to : Impaired balance/gait No Fall Risks No Fall Risks No Fall Risks No Fall Risks  Follow up Falls prevention discussed;Education provided;Falls evaluation completed Falls prevention discussed Falls prevention discussed Falls prevention discussed Falls prevention discussed     Assessment & Plan  Seronegative Inflammatory Arthritis Worsening symptoms with increased stiffness and pain in hands, back, and foot. No current treatment. -Referral to Rheumatology for further evaluation and management. -Start Celebrex 200mg  daily for pain relief until Rheumatology appointment.  Depression Currently in  remission on Fluoxetine 40mg . -Continue Fluoxetine 40mg . -Add Wellbutrin for additional support and smoking cessation aid.  Smoking Current smoker with decreased frequency. -Encouraged to continue efforts to quit smoking. -Continue Wellbutrin as aid for smoking cessation.  Emphysema Stable with occasional cough and phlegm production. -Continue Spiriva. -lung cancer screening - low dose CT  Hypertension Slightly elevated blood pressure likely due to pain. -Continue Olmesartan/Amlodipine/HCTZ 40/5/12.5mg  and Metoprolol 50mg  daily.  Hyperlipidemia/Atherosclerosis of Aorta Stable on Rosuvastatin. -Continue Rosuvastatin.  General Health Maintenance -Continue lung cancer screening due to smoking history. -Continue annual mammogram and bone density scan. -All immunizations up to date. -Consider Hepatitis A and B vaccines at next visit.  Follow-up in 6 months or sooner if needed.

## 2023-01-20 ENCOUNTER — Other Ambulatory Visit: Payer: Self-pay | Admitting: Family Medicine

## 2023-01-20 DIAGNOSIS — I10 Essential (primary) hypertension: Secondary | ICD-10-CM

## 2023-01-27 ENCOUNTER — Encounter: Payer: Self-pay | Admitting: Family Medicine

## 2023-01-27 ENCOUNTER — Ambulatory Visit: Payer: No Typology Code available for payment source | Admitting: Family Medicine

## 2023-01-27 VITALS — BP 132/76 | HR 68 | Temp 98.1°F | Resp 16 | Ht 62.0 in | Wt 173.3 lb

## 2023-01-27 DIAGNOSIS — J432 Centrilobular emphysema: Secondary | ICD-10-CM

## 2023-01-27 DIAGNOSIS — M1380 Other specified arthritis, unspecified site: Secondary | ICD-10-CM

## 2023-01-27 DIAGNOSIS — M545 Low back pain, unspecified: Secondary | ICD-10-CM

## 2023-01-27 DIAGNOSIS — I700 Atherosclerosis of aorta: Secondary | ICD-10-CM | POA: Diagnosis not present

## 2023-01-27 DIAGNOSIS — I10 Essential (primary) hypertension: Secondary | ICD-10-CM | POA: Diagnosis not present

## 2023-01-27 DIAGNOSIS — F3340 Major depressive disorder, recurrent, in remission, unspecified: Secondary | ICD-10-CM | POA: Diagnosis not present

## 2023-01-27 DIAGNOSIS — F40243 Fear of flying: Secondary | ICD-10-CM

## 2023-01-27 DIAGNOSIS — Z23 Encounter for immunization: Secondary | ICD-10-CM | POA: Diagnosis not present

## 2023-01-27 DIAGNOSIS — E7800 Pure hypercholesterolemia, unspecified: Secondary | ICD-10-CM

## 2023-01-27 DIAGNOSIS — Z720 Tobacco use: Secondary | ICD-10-CM

## 2023-01-27 DIAGNOSIS — G8929 Other chronic pain: Secondary | ICD-10-CM

## 2023-01-27 MED ORDER — BUPROPION HCL ER (XL) 150 MG PO TB24: 150.0000 mg | ORAL_TABLET | Freq: Every day | ORAL | 1 refills | Status: DC

## 2023-01-27 MED ORDER — ROSUVASTATIN CALCIUM 40 MG PO TABS: 40.0000 mg | ORAL_TABLET | Freq: Every day | ORAL | 1 refills | Status: DC

## 2023-01-27 MED ORDER — GABAPENTIN 300 MG PO CAPS: 300.0000 mg | ORAL_CAPSULE | Freq: Every day | ORAL | 1 refills | Status: DC

## 2023-01-27 MED ORDER — FLUOXETINE HCL 40 MG PO CAPS: ORAL_CAPSULE | ORAL | 1 refills | Status: DC

## 2023-01-27 MED ORDER — METOPROLOL SUCCINATE ER 50 MG PO TB24: 50.0000 mg | ORAL_TABLET | Freq: Every day | ORAL | 1 refills | Status: DC

## 2023-01-27 MED ORDER — TIOTROPIUM BROMIDE MONOHYDRATE 18 MCG IN CAPS: 18.0000 ug | ORAL_CAPSULE | Freq: Every day | RESPIRATORY_TRACT | 1 refills | Status: DC

## 2023-01-27 MED ORDER — HYDROXYZINE HCL 10 MG PO TABS: 10.0000 mg | ORAL_TABLET | Freq: Three times a day (TID) | ORAL | 0 refills | Status: AC | PRN

## 2023-01-27 MED ORDER — CELECOXIB 200 MG PO CAPS: 200.0000 mg | ORAL_CAPSULE | Freq: Every day | ORAL | 0 refills | Status: DC

## 2023-01-27 MED ORDER — OLMESARTAN-AMLODIPINE-HCTZ 40-5-12.5 MG PO TABS: 1.0000 | ORAL_TABLET | Freq: Every day | ORAL | 1 refills | Status: DC

## 2023-01-27 NOTE — Patient Instructions (Signed)
[facility]   P: [phone] F: [phone]

## 2023-02-13 ENCOUNTER — Ambulatory Visit
Admission: RE | Admit: 2023-02-13 | Discharge: 2023-02-13 | Disposition: A | Discharge status: Home / Self Care | Payer: No Typology Code available for payment source | Source: Ambulatory Visit | Attending: Acute Care | Admitting: Acute Care

## 2023-02-13 DIAGNOSIS — Z87891 Personal history of nicotine dependence: Secondary | ICD-10-CM

## 2023-02-13 DIAGNOSIS — F17210 Nicotine dependence, cigarettes, uncomplicated: Secondary | ICD-10-CM

## 2023-02-16 ENCOUNTER — Other Ambulatory Visit: Payer: Self-pay | Admitting: Nurse Practitioner

## 2023-02-16 DIAGNOSIS — J3089 Other allergic rhinitis: Secondary | ICD-10-CM

## 2023-02-17 NOTE — Telephone Encounter (Signed)
 Requested Prescriptions  Pending Prescriptions Disp Refills   fluticasone  (FLONASE ) 50 MCG/ACT nasal spray [Pharmacy Med Name: FLUTICASONE  PROP 50 MCG SPRAY] 48 mL 2    Sig: SPRAY 2 SPRAYS INTO EACH NOSTRIL EVERY DAY     Ear, Nose, and Throat: Nasal Preparations - Corticosteroids Passed - 02/17/2023  7:15 PM      Passed - Valid encounter within last 12 months    Recent Outpatient Visits           3 weeks ago Centrilobular emphysema [facility])   [facility] Churchill, Dorette, MD   5 months ago Acute maxillary sinusitis, recurrence not specified   [facility] Glenard Dorette, MD   5 months ago Centrilobular emphysema [facility])   [facility] Glenard Dorette, MD   6 months ago Other fatigue   [facility] Glenard Dorette, MD   8 months ago Well adult exam   [facility] Glenard Dorette, MD       Future Appointments             In 4 months Glenard, Krichna, MD [facility]   In 5 months Sowles, Krichna, MD [facility]

## 2023-02-24 ENCOUNTER — Other Ambulatory Visit: Payer: Self-pay | Admitting: Acute Care

## 2023-02-24 DIAGNOSIS — F17210 Nicotine dependence, cigarettes, uncomplicated: Secondary | ICD-10-CM

## 2023-02-24 DIAGNOSIS — Z122 Encounter for screening for malignant neoplasm of respiratory organs: Secondary | ICD-10-CM

## 2023-02-24 DIAGNOSIS — Z87891 Personal history of nicotine dependence: Secondary | ICD-10-CM

## 2023-03-10 ENCOUNTER — Encounter: Payer: Self-pay | Admitting: Family Medicine

## 2023-03-10 ENCOUNTER — Ambulatory Visit: Payer: No Typology Code available for payment source | Admitting: Family Medicine

## 2023-03-10 VITALS — BP 142/72 | HR 86 | Resp 16 | Ht 62.0 in | Wt 170.0 lb

## 2023-03-10 DIAGNOSIS — M5412 Radiculopathy, cervical region: Secondary | ICD-10-CM

## 2023-03-10 MED ORDER — MEDI-PATCH-LIDOCAINE 0.5-0.035-5-20 % EX PTCH: MEDICATED_PATCH | CUTANEOUS | 1 refills | Status: DC

## 2023-03-10 MED ORDER — KETOROLAC TROMETHAMINE 60 MG/2ML IM SOLN
30.0000 mg | Freq: Once | INTRAMUSCULAR | Status: AC
  Administered 2023-03-10: 30 mg via INTRAMUSCULAR

## 2023-03-10 MED ORDER — BACLOFEN 10 MG PO TABS: 5.0000 mg | ORAL_TABLET | Freq: Three times a day (TID) | ORAL | 1 refills | Status: DC

## 2023-03-10 MED ORDER — DEXAMETHASONE SODIUM PHOSPHATE 10 MG/ML IJ SOLN
10.0000 mg | Freq: Once | INTRAMUSCULAR | Status: AC
  Administered 2023-03-10: 10 mg via INTRAMUSCULAR

## 2023-03-10 NOTE — Progress Notes (Signed)
Patient ID: Nichole Wells, female    DOB: May 29, 1960, 63 y.o.   MRN: [id number]  PCP: Alba Cory, MD  Chief Complaint  Patient presents with   Neck Pain    Left side, started yesterday. "Feels like a pinched nerve"    Subjective:   Nichole Wells is a 63 y.o. female, presents to clinic with CC of the following:  HPI  Woke up with stiff, painful neck muscles she cannot rotate neck to the left.  She felt it last night as sleeping and it got progressively worse throughout the night even waking her from sleep She tried aleve this am and it barely touched it. She denies HA, fever, numbness/tingling Hx of back pain, DDD No injury  No pins/needles weakness numbness or shooting/burning or electrical pain More sore to the muscles worse with movement or palpation  Patient Active Problem List   Diagnosis Date Noted   Heart palpitations 10/23/2021   Lumbosacral facet hypertrophy ( L3-4, L4-5, and L5-S1) (Bilateral) 10/08/2021    Class: Chronic   Perennial allergic rhinitis 08/26/2021   Essential hypertension 07/24/2021   Vitamin B12 deficiency 07/24/2021   Abnormal MRI, lumbar spine (06/03/2014 & 9/9 /2023) 06/13/2021   Low back pain of over 3 months duration 05/16/2021   Spondylosis without myelopathy or radiculopathy, lumbosacral region 05/16/2021   DDD (degenerative disc disease), cervical (Multilevel) 01/14/2021   Cervical facet arthropathy (Multilevel) (Bilateral) 01/14/2021   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 01/14/2021   Retrolisthesis of lumbar spine at L1/L2 and L2/L3 01/14/2021   Lumbosacral facet arthropathy (Multilevel) (Bilateral) 01/14/2021   Osteoarthritis of hips (Bilateral) 01/14/2021   Osteoarthritis of wrist (Right) 01/14/2021   Osteoarthritis of first carpometacarpal joint of hand (Right) 01/14/2021   Chronic pain syndrome 11/14/2020   Disorder of skeletal system 11/14/2020   Problems influencing health status 11/14/2020   Chronic low  back pain (1ry area of Pain) (Bilateral) (L>R) w/o sciatica 11/14/2020    Class: Chronic   Lumbosacral facet syndrome (Bilateral) 11/14/2020   Chronic wrist pain (2ry area of Pain) (Right) 11/14/2020   History of carpal tunnel surgery of wrist (Right) 11/14/2020   Chronic neck pain (3ry area of Pain) (Bilateral) (L>R) 11/14/2020   Cervicalgia 11/14/2020   Cervical facet syndrome 11/14/2020   Chronic hip pain (4th area of Pain) (Bilateral) 11/14/2020   Recurrent major depressive disorder, in remission (HCC) 02/18/2018   Thoracic aorta atherosclerosis (HCC) 11/10/2017   Centrilobular emphysema (HCC) 11/10/2017   Abnormal liver CT 11/10/2017   Seronegative inflammatory arthritis 05/07/2017   Tobacco use 05/07/2017   Vitamin D deficiency 05/28/2016   Surgical menopause on hormone replacement therapy 04/23/2016   Status post laparoscopic assisted vaginal hysterectomy (LAVH) 01/28/2016   Family history of ovarian cancer 11/13/2015   Insomnia 03/27/2015   Uncontrolled hypertension 08/30/2014   Anxiety and depression 08/30/2014   Cardiac murmur 08/30/2014   HLD (hyperlipidemia) 08/30/2014   DDD (degenerative disc disease), lumbar 07/26/2014      Current Outpatient Medications:    Albuterol-Budesonide (AIRSUPRA) 90-80 MCG/ACT AERO, Inhale 2 puffs into the lungs 4 (four) times daily as needed., Disp: 10.7 g, Rfl: 2   baclofen (LIORESAL) 10 MG tablet, Take 0.5-1 tablets (5-10 mg total) by mouth 3 (three) times daily., Disp: 60 each, Rfl: 1   buPROPion (WELLBUTRIN XL) 150 MG 24 hr tablet, Take 1 tablet (150 mg total) by mouth daily., Disp: 90 tablet, Rfl: 1   cetirizine (ZYRTEC) 10 MG tablet, Take 10 mg  by mouth daily., Disp: , Rfl:    Cyanocobalamin (VITAMIN B-12) 500 MCG SUBL, Place 1 tablet (500 mcg total) under the tongue daily at 12 noon., Disp: 100 tablet, Rfl: 1   FLUoxetine (PROZAC) 40 MG capsule, TAKE 1 CAPSULE BY MOUTH EVERY DAY, Disp: 90 capsule, Rfl: 1   fluticasone (FLONASE) 50  MCG/ACT nasal spray, SPRAY 2 SPRAYS INTO EACH NOSTRIL EVERY DAY, Disp: 48 mL, Rfl: 2   gabapentin (NEURONTIN) 300 MG capsule, Take 1 capsule (300 mg total) by mouth at bedtime., Disp: 90 capsule, Rfl: 1   hydrOXYzine (ATARAX) 10 MG tablet, Take 1-2 tablets (10-20 mg total) by mouth 3 (three) times daily as needed., Disp: 30 tablet, Rfl: 0   Lido-Capsaicin-Men-Methyl Sal (MEDI-PATCH-LIDOCAINE) 0.5-0.035-5-20 % PTCH, Apply 1 patch topically to affected area every 12 (twelve) hours as needed for pain, Disp: 10 patch, Rfl: 1   metoprolol succinate (TOPROL-XL) 50 MG 24 hr tablet, Take 1 tablet (50 mg total) by mouth daily. TAKE WITH OR IMMEDIATELY FOLLOWING A MEAL., Disp: 90 tablet, Rfl: 1   Olmesartan-amLODIPine-HCTZ 40-5-12.5 MG TABS, Take 1 tablet by mouth daily at 12 noon., Disp: 90 tablet, Rfl: 1   rosuvastatin (CRESTOR) 40 MG tablet, Take 1 tablet (40 mg total) by mouth daily., Disp: 90 tablet, Rfl: 1   tiotropium (SPIRIVA HANDIHALER) 18 MCG inhalation capsule, Place 1 capsule (18 mcg total) into inhaler and inhale daily., Disp: 90 capsule, Rfl: 1   Vitamin D, Cholecalciferol, 50 MCG (2000 UT) CAPS, Take by mouth daily., Disp: , Rfl:    Allergies  Allergen Reactions   Hydroxychloroquine Itching    Pt take zyrtec to alleviate symptoms    Penicillin G Hives    Has patient had a PCN reaction causing immediate rash, facial/tongue/throat swelling, SOB or lightheadedness with hypotension:unsure Has patient had a PCN reaction causing severe rash involving mucus membranes or skin necrosis:unsure Has patient had a PCN reaction that required hospitalization:unsure Has patient had a PCN reaction occurring within the last 10 years:No If all of the above answers are "NO", then may proceed with Cephalosporin use.      Social History   Tobacco Use   Smoking status: Every Day    Current packs/day: 1.00    Average packs/day: 1 pack/day for 37.5 years (37.5 ttl pk-yrs)    Types: Cigarettes    Start  date: 10/06/1987   Smokeless tobacco: Never  Vaping Use   Vaping status: Never Used  Substance Use Topics   Alcohol use: Yes    Alcohol/week: 0.0 standard drinks of alcohol    Comment: none last 24hr   Drug use: No      Chart Review Today: I personally reviewed active problem list, medication list, allergies, family history, social history, health maintenance, notes from last encounter, lab results, imaging with the patient/caregiver today.   Review of Systems  Constitutional: Negative.   HENT: Negative.    Eyes: Negative.   Respiratory: Negative.    Cardiovascular: Negative.   Gastrointestinal: Negative.   Endocrine: Negative.   Genitourinary: Negative.   Musculoskeletal: Negative.   Skin: Negative.   Allergic/Immunologic: Negative.   Neurological: Negative.   Hematological: Negative.   Psychiatric/Behavioral: Negative.    All other systems reviewed and are negative.      Objective:   Vitals:   03/10/23 0938  BP: (!) 142/72  Pulse: 86  Resp: 16  SpO2: 96%  Weight: 170 lb (77.1 kg)  Height: 5\' 2"  (1.575 m)    Body mass  index is 31.09 kg/m.  Physical Exam Vitals and nursing note reviewed.  Constitutional:      Appearance: She is well-developed.  HENT:     Head: Normocephalic and atraumatic.     Nose: Nose normal.  Eyes:     General:        Right eye: No discharge.        Left eye: No discharge.     Conjunctiva/sclera: Conjunctivae normal.  Neck:     Trachea: No tracheal deviation.     Comments: Grossly normal sensation to light touch in bilateral UE Symmetrical grip strength  Cardiovascular:     Rate and Rhythm: Normal rate and regular rhythm.  Pulmonary:     Effort: Pulmonary effort is normal. No respiratory distress.     Breath sounds: No stridor.  Musculoskeletal:     Cervical back: Pain with movement and muscular tenderness present. No spinous process tenderness. Decreased range of motion.  Skin:    General: Skin is warm and dry.      Findings: No rash.  Neurological:     Mental Status: She is alert.     Motor: No abnormal muscle tone.     Coordination: Coordination normal.  Psychiatric:        Behavior: Behavior normal.      Results for orders placed or performed in visit on 08/26/22  BASIC METABOLIC PANEL WITH GFR   Collection Time: 08/26/22  9:05 AM  Result Value Ref Range   Glucose, Bld 93 65 - 99 mg/dL   BUN 20 7 - 25 mg/dL   Creat 4.09 8.11 - 9.14 mg/dL   eGFR 782 > OR = 60 NF/AOZ/3.08M5   BUN/Creatinine Ratio SEE NOTE: 6 - 22 (calc)   Sodium 138 135 - 146 mmol/L   Potassium 4.3 3.5 - 5.3 mmol/L   Chloride 105 98 - 110 mmol/L   CO2 28 20 - 32 mmol/L   Calcium 10.1 8.6 - 10.4 mg/dL       Assessment & Plan:   1. Cervical radiculopathy (Primary) Neck strain and MSK tension, tenderness and limited ROM (limited rotation) of neck to the left with no midline tenderness and no injury, intact sensation and strength Tylenol+NSAID, muscle relaxers, heat tx, showed her some very gentle exercises to do once more comfortable. Referrals to chiro/PT to see who can get her in the soonest If no improvement in sx invited to return to the office for tirgger point injections - she was given steroid and toradol shot today so will not do additional injections today - Ambulatory referral to Chiropractic - Ambulatory referral to Physical Therapy - Lido-Capsaicin-Men-Methyl Sal (MEDI-PATCH-LIDOCAINE) 0.5-0.035-5-20 % PTCH; Apply 1 patch topically to affected area every 12 (twelve) hours as needed for pain  Dispense: 10 patch; Refill: 1 - ketorolac (TORADOL) injection 30 mg - dexamethasone (DECADRON) injection 10 mg  Also if no improvement could do stronger muscle relaxer like valium - not quite a torticollis - but she is very stiff and uncomfortable in clinic today and it may be appropriate, that or pain med for a few days   Danelle Berry, PA-C 03/10/23 10:03 AM

## 2023-03-10 NOTE — Patient Instructions (Addendum)
[facility]   [address], [address] [place], [place] [address]  [email or web address]  Phone [phone]  Fax [phone]   [id number]  [place] [address] [phone] [email or web address]   Recommend doing 400-600 ibuprofen + tylenol 650 -1000 mg up to 4 x a day Use the muscle relaxer and do heat therapy (heating pad or thermacare patches to the tight painful muscles.) and then do the exercise I showed you

## 2023-03-11 ENCOUNTER — Other Ambulatory Visit: Payer: Self-pay | Admitting: Family Medicine

## 2023-03-12 ENCOUNTER — Other Ambulatory Visit: Payer: Self-pay

## 2023-03-12 ENCOUNTER — Ambulatory Visit: Payer: No Typology Code available for payment source | Admitting: Family Medicine

## 2023-03-12 ENCOUNTER — Encounter: Payer: Self-pay | Admitting: Family Medicine

## 2023-03-12 VITALS — BP 126/78 | HR 62 | Temp 98.0°F | Resp 16 | Ht 62.0 in | Wt 173.1 lb

## 2023-03-12 DIAGNOSIS — M5412 Radiculopathy, cervical region: Secondary | ICD-10-CM

## 2023-03-12 MED ORDER — TRIAMCINOLONE ACETONIDE 40 MG/ML IJ SUSP
40.0000 mg | Freq: Once | INTRAMUSCULAR | Status: AC
  Administered 2023-03-12: 40 mg via INTRAMUSCULAR

## 2023-03-12 MED ORDER — PREDNISONE 20 MG PO TABS: 40.0000 mg | ORAL_TABLET | Freq: Every day | ORAL | 0 refills | Status: AC

## 2023-03-12 MED ORDER — LIDOCAINE HCL (PF) 1 % IJ SOLN
1.0000 mL | Freq: Once | INTRAMUSCULAR | Status: AC
  Administered 2023-03-12: 1 mL via INTRADERMAL

## 2023-03-12 MED ORDER — TRAMADOL HCL 50 MG PO TABS: 50.0000 mg | ORAL_TABLET | Freq: Every day | ORAL | 0 refills | Status: DC

## 2023-03-12 NOTE — Progress Notes (Signed)
Tirgger point injection with kenalog and lidocaine with Dr. Carlynn Purl  Procedure only visit, OV done 2 days ago  Pt does have improved ROM, slightly less muscle tension and tenderness to palpation but still limited ROM of neck to the left  To upper left trapezius   Consent verbally agreed and reviewed in exam room Localized muscle group Injection with lidocaine 1% and Kenalog 40mg /1 ml on muscle  Patient tolerated procedure well No side effects  Main complaint is pain severe enough to wake her from sleep, few tramadol called in after consulting PDMP, add steroids Pt encouraged to continue heat tx, do tens unit/ROM stretches/massage, and continue OTC meds and muscle relaxers     Danelle Berry, PA-C 03/12/23

## 2023-04-24 ENCOUNTER — Other Ambulatory Visit: Payer: Self-pay | Admitting: Family Medicine

## 2023-04-24 DIAGNOSIS — M1380 Other specified arthritis, unspecified site: Secondary | ICD-10-CM

## 2023-06-19 ENCOUNTER — Ambulatory Visit (INDEPENDENT_AMBULATORY_CARE_PROVIDER_SITE_OTHER): Payer: Self-pay | Admitting: Family Medicine

## 2023-06-19 ENCOUNTER — Encounter: Payer: Self-pay | Admitting: Family Medicine

## 2023-06-19 VITALS — BP 140/84 | HR 72 | Resp 16 | Ht 61.0 in | Wt 174.9 lb

## 2023-06-19 DIAGNOSIS — Z0000 Encounter for general adult medical examination without abnormal findings: Secondary | ICD-10-CM | POA: Diagnosis not present

## 2023-06-19 DIAGNOSIS — Z1231 Encounter for screening mammogram for malignant neoplasm of breast: Secondary | ICD-10-CM

## 2023-06-19 NOTE — Patient Instructions (Signed)
 Preventive Care 16-61 Years Old, Female  Preventive care refers to lifestyle choices and visits with your health care provider that can promote health and wellness. Preventive care visits are also called wellness exams.  What can I expect for my preventive care visit?  Counseling  Your health care provider may ask you questions about your:  Medical history, including:  Past medical problems.  Family medical history.  Pregnancy history.  Current health, including:  Menstrual cycle.  Method of birth control.  Emotional well-being.  Home life and relationship well-being.  Sexual activity and sexual health.  Lifestyle, including:  Alcohol, nicotine or tobacco, and drug use.  Access to firearms.  Diet, exercise, and sleep habits.  Work and work Astronomer.  Sunscreen use.  Safety issues such as seatbelt and bike helmet use.  Physical exam  Your health care provider will check your:  Height and weight. These may be used to calculate your BMI (body mass index). BMI is a measurement that tells if you are at a healthy weight.  Waist circumference. This measures the distance around your waistline. This measurement also tells if you are at a healthy weight and may help predict your risk of certain diseases, such as type 2 diabetes and high blood pressure.  Heart rate and blood pressure.  Body temperature.  Skin for abnormal spots.  What immunizations do I need?    Vaccines are usually given at various ages, according to a schedule. Your health care provider will recommend vaccines for you based on your age, medical history, and lifestyle or other factors, such as travel or where you work.  What tests do I need?  Screening  Your health care provider may recommend screening tests for certain conditions. This may include:  Lipid and cholesterol levels.  Diabetes screening. This is done by checking your blood sugar (glucose) after you have not eaten for a while (fasting).  Pelvic exam and Pap test.  Hepatitis B test.  Hepatitis C  test.  HIV (human immunodeficiency virus) test.  STI (sexually transmitted infection) testing, if you are at risk.  Lung cancer screening.  Colorectal cancer screening.  Mammogram. Talk with your health care provider about when you should start having regular mammograms. This may depend on whether you have a family history of breast cancer.  BRCA-related cancer screening. This may be done if you have a family history of breast, ovarian, tubal, or peritoneal cancers.  Bone density scan. This is done to screen for osteoporosis.  Talk with your health care provider about your test results, treatment options, and if necessary, the need for more tests.  Follow these instructions at home:  Eating and drinking    Eat a diet that includes fresh fruits and vegetables, whole grains, lean protein, and low-fat dairy products.  Take vitamin and mineral supplements as recommended by your health care provider.  Do not drink alcohol if:  Your health care provider tells you not to drink.  You are pregnant, may be pregnant, or are planning to become pregnant.  If you drink alcohol:  Limit how much you have to 0-1 drink a day.  Know how much alcohol is in your drink. In the [place] one drink equals one 12 oz bottle of beer (355 mL), one 5 oz glass of wine (148 mL), or one 1 oz glass of hard liquor (44 mL).  Lifestyle  Brush your teeth every morning and night with fluoride toothpaste. Floss one time each day.  Exercise for at least  30 minutes 5 or more days each week.  Do not use any products that contain nicotine or tobacco. These products include cigarettes, chewing tobacco, and vaping devices, such as e-cigarettes. If you need help quitting, ask your health care provider.  Do not use drugs.  If you are sexually active, practice safe sex. Use a condom or other form of protection to prevent STIs.  If you do not wish to become pregnant, use a form of birth control. If you plan to become pregnant, see your health care provider for a  prepregnancy visit.  Take aspirin only as told by your health care provider. Make sure that you understand how much to take and what form to take. Work with your health care provider to find out whether it is safe and beneficial for you to take aspirin daily.  Find healthy ways to manage stress, such as:  Meditation, yoga, or listening to music.  Journaling.  Talking to a trusted person.  Spending time with friends and family.  Minimize exposure to UV radiation to reduce your risk of skin cancer.  Safety  Always wear your seat belt while driving or riding in a vehicle.  Do not drive:  If you have been drinking alcohol. Do not ride with someone who has been drinking.  When you are tired or distracted.  While texting.  If you have been using any mind-altering substances or drugs.  Wear a helmet and other protective equipment during sports activities.  If you have firearms in your house, make sure you follow all gun safety procedures.  Seek help if you have been physically or sexually abused.  What's next?  Visit your health care provider once a year for an annual wellness visit.  Ask your health care provider how often you should have your eyes and teeth checked.  Stay up to date on all vaccines.  This information is not intended to replace advice given to you by your health care provider. Make sure you discuss any questions you have with your health care provider.  Document Revised: 07/25/2020 Document Reviewed: 07/25/2020  Elsevier Patient Education  2024 [employer].

## 2023-06-19 NOTE — Progress Notes (Signed)
 Name: Nichole Wells   MRN: [id number]    DOB: 02/28/1960   Date:06/19/2023       Progress Note  Subjective  Chief Complaint  Chief Complaint  Patient presents with   Annual Exam    HPI  Patient presents for annual CPE.  Diet: not planning meals on a regular basis  Exercise: continue regular physical activity   Last Eye Exam: due for eye exam  Last Dental Exam: completed  Flowsheet Row Office Visit from 06/19/2023 in [facility]  AUDIT-C Score 1      Depression: Phq 9 is  negative    06/19/2023    9:09 AM 01/27/2023    8:31 AM 09/08/2022    2:43 PM 08/26/2022    8:01 AM 07/24/2022    7:34 AM  Depression screen PHQ 2/9  Decreased Interest 0 0 0 0 0  Down, Depressed, Hopeless 0 0 0 0 0  PHQ - 2 Score 0 0 0 0 0  Altered sleeping 0 0 0 0   Tired, decreased energy 0 0 0 0   Change in appetite 0 0 0 0   Feeling bad or failure about yourself  0 0 0 0   Trouble concentrating 0 0 0 0   Moving slowly or fidgety/restless 0 0 0 0   Suicidal thoughts 0 0 0 0   PHQ-9 Score 0 0 0 0   Difficult doing work/chores Not difficult at all Not difficult at all      Hypertension: BP Readings from Last 3 Encounters:  06/19/23 (!) 140/84  03/12/23 126/78  03/10/23 (!) 142/72   Obesity: Wt Readings from Last 3 Encounters:  06/19/23 174 lb 14.4 oz (79.3 kg)  03/12/23 173 lb 1.6 oz (78.5 kg)  03/10/23 170 lb (77.1 kg)   BMI Readings from Last 3 Encounters:  06/19/23 33.05 kg/m  03/12/23 31.66 kg/m  03/10/23 31.09 kg/m     Vaccines: reviewed with the patient.   Hep C Screening: completed STD testing and prevention (HIV/chl/gon/syphilis): N/A Intimate partner violence: negative screen  Sexual History : no changes, one partner  Menstrual History/LMP/Abnormal Bleeding:s/p hysterectomy  Discussed importance of follow up if any post-menopausal bleeding: not applicable  Incontinence Symptoms: positive for symptoms She has some urgency   Breast cancer:  -  Last Mammogram: up to date  - BRCA gene screening: maternal aunt ovarian cancer, mother had cervical cancer and lung cancer, maternal uncle had colon cancer  Osteoporosis Prevention : Discussed high calcium  and vitamin D  supplementation, weight bearing exercises Bone density :yes   Cervical cancer screening: not applicable due to hysterectomy  Skin cancer: Discussed monitoring for atypical lesions  Colorectal cancer: repeat in 2026    Lung cancer:  Low Dose CT Chest recommended if Age 59-80 years, 20 pack-year currently smoking OR have quit w/in 15years. Patient does qualify for screen   ECG: 2023  Advanced Care Planning: A voluntary discussion about advance care planning including the explanation and discussion of advance directives.  Discussed health care proxy and Living will, and the patient was able to identify a health care proxy as husband .  Patient does not have a living will and power of attorney of health care   Patient Active Problem List   Diagnosis Date Noted   Heart palpitations 10/23/2021   Lumbosacral facet hypertrophy ( L3-4, L4-5, and L5-S1) (Bilateral) 10/08/2021    Class: Chronic   Perennial allergic rhinitis 08/26/2021   Essential hypertension 07/24/2021  Vitamin B12 deficiency 07/24/2021   Abnormal MRI, lumbar spine (06/03/2014 & 9/9 /2023) 06/13/2021   Low back pain of over 3 months duration 05/16/2021   Spondylosis without myelopathy or radiculopathy, lumbosacral region 05/16/2021   DDD (degenerative disc disease), cervical (Multilevel) 01/14/2021   Cervical facet arthropathy (Multilevel) (Bilateral) 01/14/2021   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 01/14/2021   Retrolisthesis of lumbar spine at L1/L2 and L2/L3 01/14/2021   Lumbosacral facet arthropathy (Multilevel) (Bilateral) 01/14/2021   Osteoarthritis of hips (Bilateral) 01/14/2021   Osteoarthritis of wrist (Right) 01/14/2021   Osteoarthritis of first carpometacarpal joint of hand (Right)  01/14/2021   Chronic pain syndrome 11/14/2020   Disorder of skeletal system 11/14/2020   Problems influencing health status 11/14/2020   Chronic low back pain (1ry area of Pain) (Bilateral) (L>R) w/o sciatica 11/14/2020    Class: Chronic   Lumbosacral facet syndrome (Bilateral) 11/14/2020   Chronic wrist pain (2ry area of Pain) (Right) 11/14/2020   History of carpal tunnel surgery of wrist (Right) 11/14/2020   Chronic neck pain (3ry area of Pain) (Bilateral) (L>R) 11/14/2020   Cervicalgia 11/14/2020   Cervical facet syndrome 11/14/2020   Chronic hip pain (4th area of Pain) (Bilateral) 11/14/2020   Recurrent major depressive disorder, in remission (HCC) 02/18/2018   Thoracic aorta atherosclerosis (HCC) 11/10/2017   Centrilobular emphysema (HCC) 11/10/2017   Abnormal liver CT 11/10/2017   Seronegative inflammatory arthritis 05/07/2017   Tobacco use 05/07/2017   Vitamin D  deficiency 05/28/2016   Surgical menopause on hormone replacement therapy 04/23/2016   Status post laparoscopic assisted vaginal hysterectomy (LAVH) 01/28/2016   Family history of ovarian cancer 11/13/2015   Insomnia 03/27/2015   Uncontrolled hypertension 08/30/2014   Anxiety and depression 08/30/2014   Cardiac murmur 08/30/2014   HLD (hyperlipidemia) 08/30/2014   DDD (degenerative disc disease), lumbar 07/26/2014    Past Surgical History:  Procedure Laterality Date   BREAST BIOPSY Left    stereo- neg   BREAST CYST ASPIRATION Right    neg   BREAST SURGERY     benign biopsy   CHOLECYSTECTOMY     COLONOSCOPY WITH PROPOFOL  N/A 10/11/2019   Procedure: COLONOSCOPY WITH PROPOFOL ;  Surgeon: Luke Salaam, MD;  Location: [facility] ENDOSCOPY;  Service: Gastroenterology;  Laterality: N/A;   COLONOSCOPY WITH PROPOFOL  N/A 06/26/2021   Procedure: COLONOSCOPY WITH PROPOFOL ;  Surgeon: Luke Salaam, MD;  Location: [facility] ENDOSCOPY;  Service: Gastroenterology;  Laterality: N/A;   DILATION AND CURETTAGE OF UTERUS     LAPAROSCOPIC LYSIS  OF ADHESIONS  12/10/2015   Procedure: LAPAROSCOPIC LYSIS OF ADHESIONS;  Surgeon: Colan Dash, MD;  Location: [facility] ORS;  Service: Gynecology;;   LAPAROSCOPIC VAGINAL HYSTERECTOMY WITH SALPINGO OOPHORECTOMY Bilateral 01/28/2016   Procedure: LAPAROSCOPIC ASSISTED VAGINAL HYSTERECTOMY WITH SALPINGO OOPHORECTOMY;  Surgeon: Colan Dash, MD;  Location: [facility] ORS;  Service: Gynecology;  Laterality: Bilateral;   LAPAROSCOPY N/A 12/10/2015   Procedure: LAPAROSCOPY DIAGNOSTIC WITH BIOPSIES;  Surgeon: Colan Dash, MD;  Location: [facility] ORS;  Service: Gynecology;  Laterality: N/A;   SHOULDER ARTHROSCOPY Right    TUBAL LIGATION     UTERINE FIBROID EMBOLIZATION      Family History  Problem Relation Age of Onset   Cancer Mother        cervical, lung   Lung cancer Mother    Heart disease Father    Hypertension Father    COPD Father    Spina bifida Sister    Diabetes Maternal Grandmother    Heart  disease Maternal Grandmother    Parkinson's disease Paternal Grandfather    Diabetes Son    Breast cancer Maternal Aunt    Ovarian cancer Maternal Aunt     Social History   Socioeconomic History   Marital status: Married    Spouse name: John    Number of children: 2   Years of education: Not on file   Highest education level: Some college, no degree  Occupational History   Occupation: Production designer, theatre/television/film  Tobacco Use   Smoking status: Every Day    Current packs/day: 0.50    Average packs/day: 1 pack/day for 35.7 years (34.7 ttl pk-yrs)    Types: Cigarettes    Start date: 10/06/1987   Smokeless tobacco: Never  Vaping Use   Vaping status: Never Used  Substance and Sexual Activity   Alcohol use: Yes    Alcohol/week: 0.0 standard drinks of alcohol    Comment: none last 24hr   Drug use: No   Sexual activity: Yes    Partners: Male    Birth control/protection: Surgical  Other Topics Concern   Not on file  Social History Narrative   Married   Works full time   Has 2 grown  children    Social Drivers of Corporate investment banker Strain: Low Risk  (06/19/2023)   Overall Financial Resource Strain (CARDIA)    Difficulty of Paying Living Expenses: Not hard at all  Food Insecurity: No Food Insecurity (06/19/2023)   Hunger Vital Sign    Worried About Running Out of Food in the Last Year: Never true    Ran Out of Food in the Last Year: Never true  Transportation Needs: No Transportation Needs (06/19/2023)   PRAPARE - Administrator, Civil Service (Medical): No    Lack of Transportation (Non-Medical): No  Physical Activity: Sufficiently Active (06/19/2023)   Exercise Vital Sign    Days of Exercise per Week: 5 days    Minutes of Exercise per Session: 30 min  Stress: No Stress Concern Present (06/19/2023)   [employer] of Occupational Health - Occupational Stress Questionnaire    Feeling of Stress : Only a little  Social Connections: Moderately Isolated (06/19/2023)   Social Connection and Isolation Panel [NHANES]    Frequency of Communication with Friends and Family: More than three times a week    Frequency of Social Gatherings with Friends and Family: More than three times a week    Attends Religious Services: Never    Database administrator or Organizations: No    Attends Banker Meetings: Never    Marital Status: Married  Catering manager Violence: Not At Risk (06/19/2023)   Humiliation, Afraid, Rape, and Kick questionnaire    Fear of Current or Ex-Partner: No    Emotionally Abused: No    Physically Abused: No    Sexually Abused: No     Current Outpatient Medications:    Albuterol -Budesonide (AIRSUPRA ) 90-80 MCG/ACT AERO, Inhale 2 puffs into the lungs 4 (four) times daily as needed., Disp: 10.7 g, Rfl: 2   baclofen  (LIORESAL ) 10 MG tablet, Take 0.5-1 tablets (5-10 mg total) by mouth 3 (three) times daily., Disp: 60 each, Rfl: 1   buPROPion  (WELLBUTRIN  XL) 150 MG 24 hr tablet, Take 1 tablet (150 mg total) by mouth daily., Disp: 90  tablet, Rfl: 1   cetirizine (ZYRTEC) 10 MG tablet, Take 10 mg by mouth daily., Disp: , Rfl:    Cyanocobalamin  (VITAMIN B-12) 500 MCG SUBL, Place  1 tablet (500 mcg total) under the tongue daily at 12 noon., Disp: 100 tablet, Rfl: 1   FLUoxetine  (PROZAC ) 40 MG capsule, TAKE 1 CAPSULE BY MOUTH EVERY DAY, Disp: 90 capsule, Rfl: 1   fluticasone  (FLONASE ) 50 MCG/ACT nasal spray, SPRAY 2 SPRAYS INTO EACH NOSTRIL EVERY DAY, Disp: 48 mL, Rfl: 2   gabapentin  (NEURONTIN ) 300 MG capsule, Take 1 capsule (300 mg total) by mouth at bedtime., Disp: 90 capsule, Rfl: 1   hydrOXYzine  (ATARAX ) 10 MG tablet, Take 1-2 tablets (10-20 mg total) by mouth 3 (three) times daily as needed., Disp: 30 tablet, Rfl: 0   Lido-Capsaicin-Men-Methyl Sal (MEDI-PATCH-LIDOCAINE ) 0.5-0.035-5-20 % PTCH, Apply 1 patch topically to affected area every 12 (twelve) hours as needed for pain, Disp: 10 patch, Rfl: 1   metoprolol  succinate (TOPROL -XL) 50 MG 24 hr tablet, Take 1 tablet (50 mg total) by mouth daily. TAKE WITH OR IMMEDIATELY FOLLOWING A MEAL., Disp: 90 tablet, Rfl: 1   Olmesartan -amLODIPine -HCTZ 40-5-12.5 MG TABS, Take 1 tablet by mouth daily at 12 noon., Disp: 90 tablet, Rfl: 1   rosuvastatin  (CRESTOR ) 40 MG tablet, Take 1 tablet (40 mg total) by mouth daily., Disp: 90 tablet, Rfl: 1   tiotropium (SPIRIVA  HANDIHALER) 18 MCG inhalation capsule, Place 1 capsule (18 mcg total) into inhaler and inhale daily., Disp: 90 capsule, Rfl: 1   traMADol  (ULTRAM ) 50 MG tablet, Take 1-2 tablets (50-100 mg total) by mouth at bedtime., Disp: 12 tablet, Rfl: 0   Vitamin D , Cholecalciferol, 50 MCG (2000 UT) CAPS, Take by mouth daily., Disp: , Rfl:   Allergies  Allergen Reactions   Hydroxychloroquine  Itching    Pt take zyrtec to alleviate symptoms    Penicillin G Hives    Has patient had a PCN reaction causing immediate rash, facial/tongue/throat swelling, SOB or lightheadedness with hypotension:unsure Has patient had a PCN reaction causing severe  rash involving mucus membranes or skin necrosis:unsure Has patient had a PCN reaction that required hospitalization:unsure Has patient had a PCN reaction occurring within the last 10 years:No If all of the above answers are "NO", then may proceed with Cephalosporin use.      ROS  Constitutional: Negative for fever or weight change.  Respiratory: Negative for cough and shortness of breath.   Cardiovascular: Negative for chest pain or palpitations.  Gastrointestinal: Negative for abdominal pain, no bowel changes.  Musculoskeletal: Negative for gait problem or joint swelling.  Skin: Negative for rash.  Neurological: Negative for dizziness or headache.  No other specific complaints in a complete review of systems (except as listed in HPI above).   Objective  Vitals:   06/19/23 0911  BP: (!) 140/84  Pulse: 72  Resp: 16  SpO2: 98%  Weight: 174 lb 14.4 oz (79.3 kg)  Height: 5\' 1"  (1.549 m)    Body mass index is 33.05 kg/m.  Physical Exam  Constitutional: Patient appears well-developed and well-nourished. No distress.  HENT: Head: Normocephalic and atraumatic. Ears: B TMs ok, no erythema or effusion; Nose: Nose normal. Mouth/Throat: Oropharynx is clear and moist. No oropharyngeal exudate.  Eyes: Conjunctivae and EOM are normal. Pupils are equal, round, and reactive to light. No scleral icterus.  Neck: Normal range of motion. Neck supple. No JVD present. No thyromegaly present.  Cardiovascular: Normal rate, regular rhythm and normal heart sounds.  No murmur heard. No BLE edema. Pulmonary/Chest: Effort normal and breath sounds normal. No respiratory distress. Abdominal: Soft. Bowel sounds are normal, no distension. There is no tenderness. no masses Breast:  no lumps or masses, no nipple discharge or rashes FEMALE GENITALIA:  Not done  RECTAL: not done Musculoskeletal: Normal range of motion, no joint effusions. No gross deformities Neurological: he is alert and oriented to  person, place, and time. No cranial nerve deficit. Coordination, balance, strength, speech and gait are normal.  Skin: Skin is warm and dry. No rash noted. No erythema.  Psychiatric: Patient has a normal mood and affect. behavior is normal. Judgment and thought content normal.     Assessment & Plan  1. Well adult exam (Primary)  - Lipid panel - CBC with Differential/Platelet - Comprehensive metabolic panel with GFR - Iron, TIBC and Ferritin Panel - Hemoglobin A1c - B12 and Folate Panel - VITAMIN D  25 Hydroxy (Vit-D Deficiency, Fractures) - TSH  2. Encounter for screening mammogram for malignant neoplasm of breast  - MM 3D SCREENING MAMMOGRAM BILATERAL BREAST; Future    -USPSTF grade A and B recommendations reviewed with patient; age-appropriate recommendations, preventive care, screening tests, etc discussed and encouraged; healthy living encouraged; see AVS for patient education given to patient -Discussed importance of 150 minutes of physical activity weekly, eat two servings of fish weekly, eat one serving of tree nuts ( cashews, pistachios, pecans, almonds.Aaron Aas) every other day, eat 6 servings of fruit/vegetables daily and drink plenty of water and avoid sweet beverages.   -Reviewed Health Maintenance: Yes.

## 2023-06-25 ENCOUNTER — Ambulatory Visit: Payer: Self-pay | Admitting: Family Medicine

## 2023-06-25 LAB — HEMOGLOBIN A1C
Hgb A1c MFr Bld: 5.6 % (ref <5.7)
Mean Plasma Glucose: 114 mg/dL
eAG (mmol/L): 6.3 mmol/L

## 2023-06-25 LAB — CBC WITH DIFFERENTIAL/PLATELET
Absolute Lymphocytes: 1424 {cells}/uL (ref 850–3900)
Absolute Monocytes: 489 {cells}/uL (ref 200–950)
Basophils Absolute: 58 {cells}/uL (ref 0–200)
Basophils Relative: 0.8 %
Eosinophils Absolute: 146 {cells}/uL (ref 15–500)
Eosinophils Relative: 2 %
HCT: 42.9 % (ref 35.0–45.0)
Hemoglobin: 14.1 g/dL (ref 11.7–15.5)
MCH: 31.6 pg (ref 27.0–33.0)
MCHC: 32.9 g/dL (ref 32.0–36.0)
MCV: 96.2 fL (ref 80.0–100.0)
MPV: 10.2 fL (ref 7.5–12.5)
Monocytes Relative: 6.7 %
Neutro Abs: 5183 {cells}/uL (ref 1500–7800)
Neutrophils Relative %: 71 %
Platelets: 318 10*3/uL (ref 140–400)
RBC: 4.46 10*6/uL (ref 3.80–5.10)
RDW: 12.8 % (ref 11.0–15.0)
Total Lymphocyte: 19.5 %
WBC: 7.3 10*3/uL (ref 3.8–10.8)

## 2023-06-25 LAB — COMPREHENSIVE METABOLIC PANEL WITH GFR
AG Ratio: 1.9 (calc) (ref 1.0–2.5)
ALT: 29 U/L (ref 6–29)
AST: 25 U/L (ref 10–35)
Albumin: 4.4 g/dL (ref 3.6–5.1)
Alkaline phosphatase (APISO): 79 U/L (ref 37–153)
BUN: 18 mg/dL (ref 7–25)
CO2: 27 mmol/L (ref 20–32)
Calcium: 10.1 mg/dL (ref 8.6–10.4)
Chloride: 106 mmol/L (ref 98–110)
Creat: 0.58 mg/dL (ref 0.50–1.05)
Globulin: 2.3 g/dL (ref 1.9–3.7)
Glucose, Bld: 107 mg/dL — ABNORMAL HIGH (ref 65–99)
Potassium: 4.5 mmol/L (ref 3.5–5.3)
Sodium: 138 mmol/L (ref 135–146)
Total Bilirubin: 0.4 mg/dL (ref 0.2–1.2)
Total Protein: 6.7 g/dL (ref 6.1–8.1)
eGFR: 104 mL/min/{1.73_m2} (ref >=60)

## 2023-06-25 LAB — IRON,TIBC AND FERRITIN PANEL
%SAT: 18 % (ref 16–45)
Ferritin: 80 ng/mL (ref 16–232)
Iron: 77 ug/dL (ref 45–160)
TIBC: 426 ug/dL (ref 250–450)

## 2023-06-25 LAB — LIPID PANEL
Cholesterol: 119 mg/dL (ref <200)
HDL: 56 mg/dL (ref >=50)
LDL Cholesterol (Calc): 47 mg/dL
Non-HDL Cholesterol (Calc): 63 mg/dL (ref <130)
Total CHOL/HDL Ratio: 2.1 (calc) (ref <5.0)
Triglycerides: 84 mg/dL (ref <150)

## 2023-06-25 LAB — TSH: TSH: 1.63 m[IU]/L (ref 0.40–4.50)

## 2023-06-25 LAB — VITAMIN D 25 HYDROXY (VIT D DEFICIENCY, FRACTURES): Vit D, 25-Hydroxy: 46 ng/mL (ref 30–100)

## 2023-06-25 LAB — B12 AND FOLATE PANEL
Folate: 12.4 ng/mL
Vitamin B-12: 499 pg/mL (ref 200–1100)

## 2023-07-16 ENCOUNTER — Ambulatory Visit: Admitting: Nurse Practitioner

## 2023-07-16 ENCOUNTER — Ambulatory Visit: Payer: Self-pay

## 2023-07-16 VITALS — BP 122/64 | HR 72 | Temp 98.2°F | Ht 61.0 in | Wt 175.2 lb

## 2023-07-16 DIAGNOSIS — R051 Acute cough: Secondary | ICD-10-CM | POA: Diagnosis not present

## 2023-07-16 DIAGNOSIS — J0140 Acute pansinusitis, unspecified: Secondary | ICD-10-CM | POA: Diagnosis not present

## 2023-07-16 DIAGNOSIS — H66003 Acute suppurative otitis media without spontaneous rupture of ear drum, bilateral: Secondary | ICD-10-CM

## 2023-07-16 LAB — POC COVID19/FLU A&B COMBO
Covid Antigen, POC: NEGATIVE
Influenza A Antigen, POC: NEGATIVE
Influenza B Antigen, POC: NEGATIVE

## 2023-07-16 MED ORDER — PROMETHAZINE-DM 6.25-15 MG/5ML PO SYRP: 5.0000 mL | ORAL_SOLUTION | Freq: Four times a day (QID) | ORAL | 0 refills | Status: DC | PRN

## 2023-07-16 MED ORDER — BENZONATATE 100 MG PO CAPS: 200.0000 mg | ORAL_CAPSULE | Freq: Two times a day (BID) | ORAL | 0 refills | Status: DC | PRN

## 2023-07-16 MED ORDER — DOXYCYCLINE HYCLATE 100 MG PO TABS: 100.0000 mg | ORAL_TABLET | Freq: Two times a day (BID) | ORAL | 0 refills | Status: AC

## 2023-07-16 NOTE — Telephone Encounter (Signed)
 FYI Only or Action Required?: FYI only for provider  Patient was last seen in primary care on 06/19/2023 by Arleen Lacer, MD. Called Nurse Triage reporting Ear Drainage. Symptoms began a week ago. Interventions attempted: OTC medications: aleve . Symptoms are: gradually worsening.  Triage Disposition: See Physician Within 24 Hours  Patient/caregiver understands and will follow disposition?: Yes               Copied from CRM [id number]. Topic: Clinical - Red Word Triage >> Jul 16, 2023 10:14 AM Chrystal Crape R wrote: Pain- ear pain along with cough she believes its a sinus infection. Pt overall does not feel well. Reason for Disposition  Ear pain  Answer Assessment - Initial Assessment Questions 1. LOCATION: "Which ear is involved?"      Both ears 2. COLOR: "What is the color of the discharge?"      brownish 3. CONSISTENCY: "How runny is the discharge? Could it be water?"      Dried up 4. ONSET: "When did you first notice the discharge?"     A while 5. PAIN: "Is there any earache?" "How bad is it?"  (Scale 1-10; or mild, moderate, severe)     6/10 6. OBJECTS: "Have you put anything in your ear?" (e.g., Q-tip, other object)      no 7. OTHER SYMPTOMS: "Do you have any other symptoms?" (e.g., headache, fever, dizziness, vomiting, runny nose)     Congestion, cough, body aches  Protocols used: Ear - Discharge-A-AH

## 2023-07-16 NOTE — Progress Notes (Signed)
 BP 122/64 (BP Location: Right Arm, Patient Position: Sitting, Cuff Size: Normal)   Pulse 72   Temp 98.2 F (36.8 C) (Oral)   Ht 5\' 1"  (1.549 m)   Wt 175 lb 3.2 oz (79.5 kg)   LMP 12/24/2015 (Approximate)   SpO2 96%   BMI 33.10 kg/m    Subjective:    Patient ID: Nichole Wells, female    DOB: 03-Jul-1960, 63 y.o.   MRN: [id number]  HPI: Nichole Wells is a 63 y.o. female  Chief Complaint  Patient presents with   URI    Cough, bodyaches, congestion, sinus pressure and pain. Onset 24h     Ear Drainage    Bilateral, Pain, onset 1 month     Discussed the use of AI scribe software for clinical note transcription with the patient, who gave verbal consent to proceed.  History of Present Illness Nichole Wells is a 63 year old female with emphysema who presents with bilateral ear drainage, ear pain, bodyaches, cough , sinus pressure, and fatigue.  She has been experiencing bilateral ear drainage for the past month, particularly noticeable in the morning, accompanied by ear pain. She has not taken any antibiotics recently for this issue.  In addition to the ear symptoms, she is experiencing a cough, body aches, congestion, sinus pressure, and pain. No fever or shortness of breath. She reports she has had symptoms for awhile but symptoms got worse over night.   She uses Flonase  and allergy medications like Zyrtec or Claritin . She has previously taken doxycycline  without issues.  Her past medical history includes emphysema, for which she is currently using Airsupra  and Spiriva . She is allergic to penicillin. She has had a complete hysterectomy in the past.  She works at [employer], which may expose her to various illnesses.         07/16/2023    3:01 PM 06/19/2023    9:09 AM 01/27/2023    8:31 AM  Depression screen PHQ 2/9  Decreased Interest 0 0 0  Down, Depressed, Hopeless 0 0 0  PHQ - 2 Score 0 0 0  Altered sleeping 0 0 0  Tired, decreased energy 0 0 0  Change in appetite 0 0  0  Feeling bad or failure about yourself  0 0 0  Trouble concentrating 0 0 0  Moving slowly or fidgety/restless 0 0 0  Suicidal thoughts 0 0 0  PHQ-9 Score 0 0 0  Difficult doing work/chores Not difficult at all Not difficult at all Not difficult at all    Relevant past medical, surgical, family and social history reviewed and updated as indicated. Interim medical history since our last visit reviewed. Allergies and medications reviewed and updated.  Review of Systems  Ten systems reviewed and is negative except as mentioned in HPI      Objective:      BP 122/64 (BP Location: Right Arm, Patient Position: Sitting, Cuff Size: Normal)   Pulse 72   Temp 98.2 F (36.8 C) (Oral)   Ht 5\' 1"  (1.549 m)   Wt 175 lb 3.2 oz (79.5 kg)   LMP 12/24/2015 (Approximate)   SpO2 96%   BMI 33.10 kg/m    Wt Readings from Last 3 Encounters:  07/16/23 175 lb 3.2 oz (79.5 kg)  06/19/23 174 lb 14.4 oz (79.3 kg)  03/12/23 173 lb 1.6 oz (78.5 kg)    Physical Exam Vitals reviewed.  Constitutional:      Appearance: Normal appearance.  HENT:  Head: Normocephalic.     Right Ear: Tympanic membrane is erythematous.     Left Ear: Tympanic membrane is erythematous.     Nose: Congestion present.     Right Sinus: Maxillary sinus tenderness and frontal sinus tenderness present.     Left Sinus: Maxillary sinus tenderness and frontal sinus tenderness present.     Mouth/Throat:     Mouth: Mucous membranes are moist.     Pharynx: Oropharynx is clear. Posterior oropharyngeal erythema present. No oropharyngeal exudate.  Eyes:     Extraocular Movements: Extraocular movements intact.     Conjunctiva/sclera: Conjunctivae normal.     Pupils: Pupils are equal, round, and reactive to light.  Cardiovascular:     Rate and Rhythm: Normal rate and regular rhythm.  Pulmonary:     Effort: Pulmonary effort is normal.     Breath sounds: Normal breath sounds.  Lymphadenopathy:     Cervical: Cervical adenopathy  present.  Skin:    General: Skin is warm and dry.  Neurological:     General: No focal deficit present.     Mental Status: She is alert and oriented to person, place, and time. Mental status is at baseline.  Psychiatric:        Mood and Affect: Mood normal.        Behavior: Behavior normal.        Thought Content: Thought content normal.        Judgment: Judgment normal.       Results for orders placed or performed in visit on 07/16/23  POC Covid19/Flu A&B Antigen   Collection Time: 07/16/23  3:16 PM  Result Value Ref Range   Influenza A Antigen, POC Negative Negative   Influenza B Antigen, POC Negative Negative   Covid Antigen, POC Negative Negative          Assessment & Plan:   Problem List Items Addressed This Visit   None Visit Diagnoses       Non-recurrent acute suppurative otitis media of both ears without spontaneous rupture of tympanic membranes    -  Primary   Relevant Medications   doxycycline  (VIBRA -TABS) 100 MG tablet     Acute non-recurrent pansinusitis       Relevant Medications   doxycycline  (VIBRA -TABS) 100 MG tablet   benzonatate  (TESSALON ) 100 MG capsule   promethazine -dextromethorphan (PROMETHAZINE -DM) 6.25-15 MG/5ML syrup     Acute cough       Relevant Medications   benzonatate  (TESSALON ) 100 MG capsule   promethazine -dextromethorphan (PROMETHAZINE -DM) 6.25-15 MG/5ML syrup   Other Relevant Orders   POC Covid19/Flu A&B Antigen (Completed)        Assessment and Plan Assessment & Plan Otitis media bilateral Reports bilateral ear drainage for a month with associated ear pain, particularly in the morning. No recent antibiotic use. Possible ear infection. Tolerates doxycycline  well. - Prescribe doxycycline  100 mg twice a day for 7 days.  Sinus infection Presents with cough, body aches, congestion, sinus pressure, and pain. Denies fever or shortness of breath. Suspected viral upper respiratory infection. Discussed symptomatic relief options. -  Order COVID-19 and influenza tests which were negative - Recommend continuing Flonase  and an allergy pill like Zyrtec or Claritin . - Advise increasing fluid intake. - Recommend Mucinex for congestion. - Provide a note for work absence. - phenergan  dm, tessalon  perls for cough  Emphysema Emphysema managed with Airsupra  and Spiriva . - Continue treatment with Airsupra  and Spiriva .        Follow up plan: Return if symptoms  worsen or fail to improve.

## 2023-07-19 ENCOUNTER — Other Ambulatory Visit: Payer: Self-pay | Admitting: Family Medicine

## 2023-07-19 DIAGNOSIS — I10 Essential (primary) hypertension: Secondary | ICD-10-CM

## 2023-07-28 ENCOUNTER — Encounter: Payer: Self-pay | Admitting: Family Medicine

## 2023-07-28 ENCOUNTER — Ambulatory Visit: Payer: Self-pay | Admitting: Family Medicine

## 2023-07-28 VITALS — BP 118/74 | HR 73 | Resp 16 | Ht 61.0 in | Wt 174.3 lb

## 2023-07-28 DIAGNOSIS — E538 Deficiency of other specified B group vitamins: Secondary | ICD-10-CM

## 2023-07-28 DIAGNOSIS — I700 Atherosclerosis of aorta: Secondary | ICD-10-CM

## 2023-07-28 DIAGNOSIS — J432 Centrilobular emphysema: Secondary | ICD-10-CM | POA: Diagnosis not present

## 2023-07-28 DIAGNOSIS — I10 Essential (primary) hypertension: Secondary | ICD-10-CM

## 2023-07-28 DIAGNOSIS — F3340 Major depressive disorder, recurrent, in remission, unspecified: Secondary | ICD-10-CM

## 2023-07-28 DIAGNOSIS — M5412 Radiculopathy, cervical region: Secondary | ICD-10-CM | POA: Diagnosis not present

## 2023-07-28 DIAGNOSIS — M1380 Other specified arthritis, unspecified site: Secondary | ICD-10-CM

## 2023-07-28 DIAGNOSIS — H60543 Acute eczematoid otitis externa, bilateral: Secondary | ICD-10-CM

## 2023-07-28 MED ORDER — OLMESARTAN-AMLODIPINE-HCTZ 40-5-12.5 MG PO TABS: 1.0000 | ORAL_TABLET | Freq: Every day | ORAL | 1 refills | Status: DC

## 2023-07-28 MED ORDER — TRAMADOL HCL 50 MG PO TABS: 50.0000 mg | ORAL_TABLET | Freq: Every day | ORAL | 0 refills | Status: DC

## 2023-07-28 MED ORDER — TRIAMCINOLONE ACETONIDE 0.025 % EX LOTN: 1.0000 | TOPICAL_LOTION | Freq: Two times a day (BID) | CUTANEOUS | 0 refills | Status: AC

## 2023-07-28 MED ORDER — GABAPENTIN 300 MG PO CAPS: 300.0000 mg | ORAL_CAPSULE | Freq: Every day | ORAL | 1 refills | Status: AC

## 2023-07-28 MED ORDER — METHYLPREDNISOLONE 4 MG PO TBPK: ORAL_TABLET | ORAL | 0 refills | Status: DC

## 2023-07-28 MED ORDER — METOPROLOL SUCCINATE ER 50 MG PO TB24: 50.0000 mg | ORAL_TABLET | Freq: Every day | ORAL | 1 refills | Status: DC

## 2023-07-28 MED ORDER — TIOTROPIUM BROMIDE MONOHYDRATE 18 MCG IN CAPS: 18.0000 ug | ORAL_CAPSULE | Freq: Every day | RESPIRATORY_TRACT | 1 refills | Status: DC

## 2023-07-28 MED ORDER — BACLOFEN 10 MG PO TABS: 5.0000 mg | ORAL_TABLET | Freq: Three times a day (TID) | ORAL | 1 refills | Status: DC | PRN

## 2023-07-28 MED ORDER — ROSUVASTATIN CALCIUM 40 MG PO TABS: 40.0000 mg | ORAL_TABLET | Freq: Every day | ORAL | 1 refills | Status: DC

## 2023-07-28 MED ORDER — FLUOXETINE HCL 40 MG PO CAPS: 40.0000 mg | ORAL_CAPSULE | Freq: Every day | ORAL | 1 refills | Status: DC

## 2023-07-28 NOTE — Progress Notes (Signed)
 Name: Nichole Wells   MRN: [id number]    DOB: 1960-06-10   Date:07/28/2023       Progress Note  Subjective  Chief Complaint  Chief Complaint  Patient presents with   Medical Management of Chronic Issues   Discussed the use of AI scribe software for clinical note transcription with the patient, who gave verbal consent to proceed.  History of Present Illness Nichole Wells is a 63 year old female with cervical radiculopathy and emphysema who presents for a regular follow-up and evaluation of neck pain and ear discomfort.  She has ongoing neck issues related to cervical radiculopathy, which has been flaring up. Initially, the pain started on the right side, then moved to the left, and now involves the whole neck, radiating forward and down the left arm. She describes the sensation as tightness and notes that it worsens throughout the day, currently rating her pain as a 5 out of 10, pain on the left arm is like numbness. She has been using Aleve  and occasionally lidocaine  patches for relief but has not been taking muscle relaxers or gabapentin  recently.  She reports a recent double ear infection and sinus infection, for which she completed a course of doxycycline . Despite treatment, her ears still do not feel right, describing a sensation similar to being underwater. She mentions dryness and irritation in the ears  Regarding her emphysema, she is currently using Spiriva  and Airsupra . She experiences a daily productive cough over the past couple of weeks , but usually it is a dry cough, which she believes is related to a recent cold. No wheezing or shortness of breath.  She has thoracic aortic atherosclerosis and is taking rosuvastatin  40 mg daily. She is taking metoprolol , valsartan, amlodipine , and HCTZ for blood pressure. No recent chest pain or palpitations.  For her major depression, she is currently taking fluoxetine  40 mg and reports feeling okay, with her depression in remission. She has  stopped taking Wellbutrin .  She has a history of seronegative inflammatory arthritis, which causes occasional joint stiffness. She has not seen a rheumatologist recently due to a missed referral.  Her recent lab work shows improved A1c levels. She has been trying to cut down on carbohydrates. Her vitamin D , thyroid  function, and lipid panel are within normal limits, though her B12 is trending down.    Patient Active Problem List   Diagnosis Date Noted   Heart palpitations 10/23/2021   Lumbosacral facet hypertrophy ( L3-4, L4-5, and L5-S1) (Bilateral) 10/08/2021    Class: Chronic   Perennial allergic rhinitis 08/26/2021   Essential hypertension 07/24/2021   Vitamin B12 deficiency 07/24/2021   Abnormal MRI, lumbar spine (06/03/2014 & 9/9 /2023) 06/13/2021   Low back pain of over 3 months duration 05/16/2021   Spondylosis without myelopathy or radiculopathy, lumbosacral region 05/16/2021   DDD (degenerative disc disease), cervical (Multilevel) 01/14/2021   Cervical facet arthropathy (Multilevel) (Bilateral) 01/14/2021   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 01/14/2021   Retrolisthesis of lumbar spine at L1/L2 and L2/L3 01/14/2021   Lumbosacral facet arthropathy (Multilevel) (Bilateral) 01/14/2021   Osteoarthritis of hips (Bilateral) 01/14/2021   Osteoarthritis of wrist (Right) 01/14/2021   Osteoarthritis of first carpometacarpal joint of hand (Right) 01/14/2021   Chronic pain syndrome 11/14/2020   Disorder of skeletal system 11/14/2020   Problems influencing health status 11/14/2020   Chronic low back pain (1ry area of Pain) (Bilateral) (L>R) w/o sciatica 11/14/2020    Class: Chronic   Lumbosacral facet syndrome (Bilateral) 11/14/2020  Chronic wrist pain (2ry area of Pain) (Right) 11/14/2020   History of carpal tunnel surgery of wrist (Right) 11/14/2020   Chronic neck pain (3ry area of Pain) (Bilateral) (L>R) 11/14/2020   Cervicalgia 11/14/2020   Cervical facet  syndrome 11/14/2020   Chronic hip pain (4th area of Pain) (Bilateral) 11/14/2020   Recurrent major depressive disorder, in remission (HCC) 02/18/2018   Thoracic aorta atherosclerosis (HCC) 11/10/2017   Centrilobular emphysema (HCC) 11/10/2017   Abnormal liver CT 11/10/2017   Seronegative inflammatory arthritis 05/07/2017   Tobacco use 05/07/2017   Vitamin D  deficiency 05/28/2016   Surgical menopause on hormone replacement therapy 04/23/2016   Status post laparoscopic assisted vaginal hysterectomy (LAVH) 01/28/2016   Family history of ovarian cancer 11/13/2015   Insomnia 03/27/2015   Uncontrolled hypertension 08/30/2014   Anxiety and depression 08/30/2014   Cardiac murmur 08/30/2014   HLD (hyperlipidemia) 08/30/2014   DDD (degenerative disc disease), lumbar 07/26/2014    Past Surgical History:  Procedure Laterality Date   BREAST BIOPSY Left    stereo- neg   BREAST CYST ASPIRATION Right    neg   BREAST SURGERY     benign biopsy   CHOLECYSTECTOMY     COLONOSCOPY WITH PROPOFOL  N/A 10/11/2019   Procedure: COLONOSCOPY WITH PROPOFOL ;  Surgeon: Luke Salaam, MD;  Location: [facility] ENDOSCOPY;  Service: Gastroenterology;  Laterality: N/A;   COLONOSCOPY WITH PROPOFOL  N/A 06/26/2021   Procedure: COLONOSCOPY WITH PROPOFOL ;  Surgeon: Luke Salaam, MD;  Location: [facility] ENDOSCOPY;  Service: Gastroenterology;  Laterality: N/A;   DILATION AND CURETTAGE OF UTERUS     LAPAROSCOPIC LYSIS OF ADHESIONS  12/10/2015   Procedure: LAPAROSCOPIC LYSIS OF ADHESIONS;  Surgeon: Colan Dash, MD;  Location: [facility] ORS;  Service: Gynecology;;   LAPAROSCOPIC VAGINAL HYSTERECTOMY WITH SALPINGO OOPHORECTOMY Bilateral 01/28/2016   Procedure: LAPAROSCOPIC ASSISTED VAGINAL HYSTERECTOMY WITH SALPINGO OOPHORECTOMY;  Surgeon: Colan Dash, MD;  Location: [facility] ORS;  Service: Gynecology;  Laterality: Bilateral;   LAPAROSCOPY N/A 12/10/2015   Procedure: LAPAROSCOPY DIAGNOSTIC WITH BIOPSIES;  Surgeon: Colan Dash, MD;  Location: [facility] ORS;  Service: Gynecology;  Laterality: N/A;   SHOULDER ARTHROSCOPY Right    TUBAL LIGATION     UTERINE FIBROID EMBOLIZATION      Family History  Problem Relation Age of Onset   Cancer Mother        cervical, lung   Lung cancer Mother    Heart disease Father    Hypertension Father    COPD Father    Spina bifida Sister    Diabetes Maternal Grandmother    Heart disease Maternal Grandmother    Parkinson's disease Paternal Grandfather    Diabetes Son    Breast cancer Maternal Aunt    Ovarian cancer Maternal Aunt     Social History   Tobacco Use   Smoking status: Every Day    Current packs/day: 0.50    Average packs/day: 1 pack/day for 35.8 years (34.7 ttl pk-yrs)    Types: Cigarettes    Start date: 10/06/1987   Smokeless tobacco: Never  Substance Use Topics   Alcohol use: Yes    Alcohol/week: 0.0 standard drinks of alcohol    Comment: none last 24hr     Current Outpatient Medications:    Albuterol -Budesonide (AIRSUPRA ) 90-80 MCG/ACT AERO, Inhale 2 puffs into the lungs 4 (four) times daily as needed., Disp: 10.7 g, Rfl: 2   baclofen  (LIORESAL ) 10 MG tablet, Take 0.5-1 tablets (5-10 mg total) by mouth 3 (three) times daily., Disp:  60 each, Rfl: 1   cetirizine (ZYRTEC) 10 MG tablet, Take 10 mg by mouth daily., Disp: , Rfl:    Cyanocobalamin  (VITAMIN B-12) 500 MCG SUBL, Place 1 tablet (500 mcg total) under the tongue daily at 12 noon., Disp: 100 tablet, Rfl: 1   FLUoxetine  (PROZAC ) 40 MG capsule, TAKE 1 CAPSULE BY MOUTH EVERY DAY, Disp: 90 capsule, Rfl: 1   fluticasone  (FLONASE ) 50 MCG/ACT nasal spray, SPRAY 2 SPRAYS INTO EACH NOSTRIL EVERY DAY, Disp: 48 mL, Rfl: 2   gabapentin  (NEURONTIN ) 300 MG capsule, Take 1 capsule (300 mg total) by mouth at bedtime., Disp: 90 capsule, Rfl: 1   hydrOXYzine  (ATARAX ) 10 MG tablet, Take 1-2 tablets (10-20 mg total) by mouth 3 (three) times daily as needed., Disp: 30 tablet, Rfl: 0   metoprolol  succinate  (TOPROL -XL) 50 MG 24 hr tablet, Take 1 tablet (50 mg total) by mouth daily. TAKE WITH OR IMMEDIATELY FOLLOWING A MEAL., Disp: 90 tablet, Rfl: 1   Olmesartan -amLODIPine -HCTZ 40-5-12.5 MG TABS, Take 1 tablet by mouth daily at 12 noon., Disp: 90 tablet, Rfl: 1   rosuvastatin  (CRESTOR ) 40 MG tablet, Take 1 tablet (40 mg total) by mouth daily., Disp: 90 tablet, Rfl: 1   tiotropium (SPIRIVA  HANDIHALER) 18 MCG inhalation capsule, Place 1 capsule (18 mcg total) into inhaler and inhale daily., Disp: 90 capsule, Rfl: 1   traMADol  (ULTRAM ) 50 MG tablet, Take 1-2 tablets (50-100 mg total) by mouth at bedtime., Disp: 12 tablet, Rfl: 0   Vitamin D , Cholecalciferol, 50 MCG (2000 UT) CAPS, Take by mouth daily., Disp: , Rfl:    benzonatate  (TESSALON ) 100 MG capsule, Take 2 capsules (200 mg total) by mouth 2 (two) times daily as needed for cough., Disp: 20 capsule, Rfl: 0   buPROPion  (WELLBUTRIN  XL) 150 MG 24 hr tablet, Take 1 tablet (150 mg total) by mouth daily. (Patient not taking: Reported on 07/28/2023), Disp: 90 tablet, Rfl: 1   Lido-Capsaicin-Men-Methyl Sal (MEDI-PATCH-LIDOCAINE ) 0.5-0.035-5-20 % PTCH, Apply 1 patch topically to affected area every 12 (twelve) hours as needed for pain (Patient not taking: Reported on 07/16/2023), Disp: 10 patch, Rfl: 1   promethazine -dextromethorphan (PROMETHAZINE -DM) 6.25-15 MG/5ML syrup, Take 5 mLs by mouth 4 (four) times daily as needed for cough., Disp: 118 mL, Rfl: 0  Allergies  Allergen Reactions   Hydroxychloroquine  Itching    Pt take zyrtec to alleviate symptoms    Penicillin G Hives    Has patient had a PCN reaction causing immediate rash, facial/tongue/throat swelling, SOB or lightheadedness with hypotension:unsure Has patient had a PCN reaction causing severe rash involving mucus membranes or skin necrosis:unsure Has patient had a PCN reaction that required hospitalization:unsure Has patient had a PCN reaction occurring within the last 10 years:No If all of the  above answers are NO, then may proceed with Cephalosporin use.     I personally reviewed active problem list, medication list, allergies with the patient/caregiver today.   ROS  Ten systems reviewed and is negative except as mentioned in HPI    Objective Physical Exam  CONSTITUTIONAL: Patient appears well-developed and well-nourished. No distress. HEENT: Head atraumatic, normocephalic, neck supple. Middle ear normal. Dryness and some redness in the ear canal. CARDIOVASCULAR: Normal rate, regular rhythm and normal heart sounds. No murmur heard. No BLE edema. PULMONARY: Effort normal and breath sounds normal. No respiratory distress. ABDOMINAL: There is no tenderness or distention. MUSCULOSKELETAL: neck is tight, normal grip,  PSYCHIATRIC: Patient has a normal mood and affect. Behavior is normal. Judgment and  thought content normal.  Vitals:   07/28/23 0807  BP: 118/74  Pulse: 73  Resp: 16  SpO2: 97%  Weight: 174 lb 4.8 oz (79.1 kg)  Height: 5' 1 (1.549 m)    Body mass index is 32.93 kg/m.  Recent Results (from the past 2160 hours)  B12 and Folate Panel     Status: None   Collection Time: 06/19/23 10:00 AM  Result Value Ref Range   Vitamin B-12 499 200 - 1,100 pg/mL   Folate 12.4 ng/mL    Comment:                            Reference Range                            Low:           <3.4                            Borderline:    3.4-5.4                            Normal:        >5.4 .   CBC with Differential/Platelet     Status: None   Collection Time: 06/19/23 10:00 AM  Result Value Ref Range   WBC 7.3 3.8 - 10.8 Thousand/uL   RBC 4.46 3.80 - 5.10 Million/uL   Hemoglobin 14.1 11.7 - 15.5 g/dL   HCT 82.9 56.2 - 13.0 %   MCV 96.2 80.0 - 100.0 fL   MCH 31.6 27.0 - 33.0 pg   MCHC 32.9 32.0 - 36.0 g/dL    Comment: For adults, a slight decrease in the calculated MCHC value (in the range of 30 to 32 g/dL) is most likely not clinically significant; however, it  should be interpreted with caution in correlation with other red cell parameters and the patient's clinical condition.    RDW 12.8 11.0 - 15.0 %   Platelets 318 140 - 400 Thousand/uL   MPV 10.2 7.5 - 12.5 fL   Neutro Abs 5,183 1,500 - 7,800 cells/uL   Absolute Lymphocytes 1,424 850 - 3,900 cells/uL   Absolute Monocytes 489 200 - 950 cells/uL   Eosinophils Absolute 146 15 - 500 cells/uL   Basophils Absolute 58 0 - 200 cells/uL   Neutrophils Relative % 71 %   Total Lymphocyte 19.5 %   Monocytes Relative 6.7 %   Eosinophils Relative 2.0 %   Basophils Relative 0.8 %  Comprehensive metabolic panel with GFR     Status: Abnormal   Collection Time: 06/19/23 10:00 AM  Result Value Ref Range   Glucose, Bld 107 (H) 65 - 99 mg/dL    Comment: .            Fasting reference interval . For someone without known diabetes, a glucose value between 100 and 125 mg/dL is consistent with prediabetes and should be confirmed with a follow-up test. .    BUN 18 7 - 25 mg/dL   Creat 8.65 7.84 - 6.96 mg/dL   eGFR 295 > OR = 60 MW/UXL/2.44W1   BUN/Creatinine Ratio SEE NOTE: 6 - 22 (calc)    Comment:    Not Reported: BUN and Creatinine are within    reference range. .    Sodium 138 135 -  146 mmol/L   Potassium 4.5 3.5 - 5.3 mmol/L   Chloride 106 98 - 110 mmol/L   CO2 27 20 - 32 mmol/L   Calcium  10.1 8.6 - 10.4 mg/dL   Total Protein 6.7 6.1 - 8.1 g/dL   Albumin 4.4 3.6 - 5.1 g/dL   Globulin 2.3 1.9 - 3.7 g/dL (calc)   AG Ratio 1.9 1.0 - 2.5 (calc)   Total Bilirubin 0.4 0.2 - 1.2 mg/dL   Alkaline phosphatase (APISO) 79 37 - 153 U/L   AST 25 10 - 35 U/L   ALT 29 6 - 29 U/L  Hemoglobin A1c     Status: None   Collection Time: 06/19/23 10:00 AM  Result Value Ref Range   Hgb A1c MFr Bld 5.6 <5.7 %    Comment: For the purpose of screening for the presence of diabetes: . <5.7%       Consistent with the absence of diabetes 5.7-6.4%    Consistent with increased risk for diabetes              (prediabetes) > or =6.5%  Consistent with diabetes . This assay result is consistent with a decreased risk of diabetes. . Currently, no consensus exists regarding use of hemoglobin A1c for diagnosis of diabetes in children. . According to American Diabetes Association (ADA) guidelines, hemoglobin A1c <7.0% represents optimal control in non-pregnant diabetic patients. Different metrics may apply to specific patient populations.  Standards of Medical Care in Diabetes(ADA). .    Mean Plasma Glucose 114 mg/dL   eAG (mmol/L) 6.3 mmol/L  Iron, TIBC and Ferritin Panel     Status: None   Collection Time: 06/19/23 10:00 AM  Result Value Ref Range   Iron 77 45 - 160 mcg/dL   TIBC 295 621 - 308 mcg/dL (calc)   %SAT 18 16 - 45 % (calc)   Ferritin 80 16 - 232 ng/mL  Lipid panel     Status: None   Collection Time: 06/19/23 10:00 AM  Result Value Ref Range   Cholesterol 119 <200 mg/dL   HDL 56 > OR = 50 mg/dL   Triglycerides 84 <657 mg/dL   LDL Cholesterol (Calc) 47 mg/dL (calc)    Comment: Reference range: <100 . Desirable range <100 mg/dL for primary prevention;   <70 mg/dL for patients with CHD or diabetic patients  with > or = 2 CHD risk factors. Aaron Aas LDL-C is now calculated using the Martin-Hopkins  calculation, which is a validated novel method providing  better accuracy than the Friedewald equation in the  estimation of LDL-C.  Melinda Sprawls et al. Erroll Heard. 8469;629(52): 2061-2068  (http://education.[facility].com/faq/FAQ164)    Total CHOL/HDL Ratio 2.1 <5.0 (calc)   Non-HDL Cholesterol (Calc) 63 <841 mg/dL (calc)    Comment: For patients with diabetes plus 1 major ASCVD risk  factor, treating to a non-HDL-C goal of <100 mg/dL  (LDL-C of <32 mg/dL) is considered a therapeutic  option.   TSH     Status: None   Collection Time: 06/19/23 10:00 AM  Result Value Ref Range   TSH 1.63 0.40 - 4.50 mIU/L  VITAMIN D  25 Hydroxy (Vit-D Deficiency, Fractures)     Status: None    Collection Time: 06/19/23 10:00 AM  Result Value Ref Range   Vit D, 25-Hydroxy 46 30 - 100 ng/mL    Comment: Vitamin D  Status         25-OH Vitamin D : . Deficiency:                    <  20 ng/mL Insufficiency:             20 - 29 ng/mL Optimal:                 > or = 30 ng/mL . For 25-OH Vitamin D  testing on patients on  D2-supplementation and patients for whom quantitation  of D2 and D3 fractions is required, the [facility](TM) 25-OH VIT D, (D2,D3), LC/MS/MS is recommended: order  code 16109 (patients >99yrs). . See Note 1 . Note 1 . For additional information, please refer to  http://education.[facility].com/faq/FAQ199  (This link is being provided for informational/ educational purposes only.)   POC Covid19/Flu A&B Antigen     Status: None   Collection Time: 07/16/23  3:16 PM  Result Value Ref Range   Influenza A Antigen, POC Negative Negative   Influenza B Antigen, POC Negative Negative   Covid Antigen, POC Negative Negative    Diabetic Foot Exam:     PHQ2/9:    07/28/2023    8:05 AM 07/16/2023    3:01 PM 06/19/2023    9:09 AM 01/27/2023    8:31 AM 09/08/2022    2:43 PM  Depression screen PHQ 2/9  Decreased Interest 0 0 0 0 0  Down, Depressed, Hopeless 0 0 0 0 0  PHQ - 2 Score 0 0 0 0 0  Altered sleeping 0 0 0 0 0  Tired, decreased energy 0 0 0 0 0  Change in appetite 0 0 0 0 0  Feeling bad or failure about yourself  0 0 0 0 0  Trouble concentrating 0 0 0 0 0  Moving slowly or fidgety/restless 0 0 0 0 0  Suicidal thoughts 0 0 0 0 0  PHQ-9 Score 0 0 0 0 0  Difficult doing work/chores Not difficult at all Not difficult at all Not difficult at all Not difficult at all     phq 9 is negative  Fall Risk:    07/28/2023    8:03 AM 07/16/2023    3:00 PM 06/19/2023    9:09 AM 03/10/2023    9:39 AM 01/27/2023    8:26 AM  Fall Risk   Falls in the past year? 1 1 0 0 1  Number falls in past yr: 0 0 0 0 0  Injury with Fall? 1 1 0 0 1  Risk for fall due to :  Impaired balance/gait  No Fall Risks No Fall Risks Impaired balance/gait  Follow up Education provided;Falls evaluation completed Falls evaluation completed Falls prevention discussed;Education provided;Falls evaluation completed Falls prevention discussed Falls prevention discussed;Education provided;Falls evaluation completed      Assessment & Plan Cervical radiculopathy Chronic cervical radiculopathy with recent flare-up. Previous treatments provided limited relief. Current pain level 5/10, worsening throughout the day. - Prescribe Medrol  dose pack for inflammation. - Prescribe baclofen  10 mg for muscle relaxation, three times a day as needed, max 20 mg/day. - Prescribe gabapentin  for tingling and numbness during flare-ups. - Prescribe tramadol  for acute pain, as needed. - Advise use of over-the-counter lidocaine  patches for additional pain relief.  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 She has not seen a rheumatologist due to referral issues. - Advise contacting rheumatologist for appointment. - Provide contact information for rheumatologist.  Emphysema Centrilobular emphysema managed with Spiriva  and Airsupra . Productive cough likely due to recent cold. No wheezing or shortness of breath. Medication cost affects adherence. - Continue Spiriva  and Airsupra  as prescribed. - Discuss cost-effective options if medication  affordability becomes an issue.  Thoracic aortic atherosclerosis Thoracic aortic atherosclerosis managed with rosuvastatin . Lipid panel shows improvement with LDL at 47. - Prescribe rosuvastatin  40 mg daily.  Prediabetes Prediabetes with fasting glucose at 107. A1c improved to 5.6. Dietary changes include reduced carbohydrate intake. - Continue current dietary modifications.  Major depressive disorder, in remission Major depressive disorder in remission, well-managed on fluoxetine  40 mg. - Continue  fluoxetine  40 mg daily.  General Health Maintenance Vitamin D  and B12 levels adequate with current supplementation. B12 trending down but not critically low. - Continue over-the-counter vitamin D  supplementation. - Take B12 supplement three times a week in addition to daily B complex.  Follow-up Follow-up plan established for ongoing management of conditions. - Schedule follow-up appointment in six months.

## 2023-08-03 ENCOUNTER — Encounter: Payer: Self-pay | Admitting: Family Medicine

## 2023-08-04 ENCOUNTER — Other Ambulatory Visit: Payer: Self-pay

## 2023-08-04 DIAGNOSIS — M1380 Other specified arthritis, unspecified site: Secondary | ICD-10-CM

## 2023-08-19 ENCOUNTER — Other Ambulatory Visit: Payer: Self-pay | Admitting: Family Medicine

## 2023-08-19 DIAGNOSIS — M5412 Radiculopathy, cervical region: Secondary | ICD-10-CM

## 2023-09-22 ENCOUNTER — Other Ambulatory Visit: Payer: Self-pay | Admitting: Family Medicine

## 2023-09-22 DIAGNOSIS — M5412 Radiculopathy, cervical region: Secondary | ICD-10-CM

## 2023-11-12 ENCOUNTER — Other Ambulatory Visit: Payer: Self-pay | Admitting: Family Medicine

## 2023-11-12 DIAGNOSIS — J3089 Other allergic rhinitis: Secondary | ICD-10-CM

## 2023-11-19 ENCOUNTER — Ambulatory Visit: Payer: Self-pay

## 2023-11-19 NOTE — Telephone Encounter (Signed)
 Pt already have appt sch'd for 10/10

## 2023-11-19 NOTE — Telephone Encounter (Signed)
 FYI Only or Action Required?: FYI only for provider.  Patient was last seen in primary care on 07/28/2023 by Glenard Mire, MD.  Called Nurse Triage reporting Sore Throat.  Symptoms began several days ago.  Interventions attempted: OTC medications: Allergy Medication.  Symptoms are: gradually worsening.  Triage Disposition: See PCP When Office is Open (Within 3 Days)  Patient/caregiver understands and will follow disposition?: Yes Reason for Disposition  [1] Sore throat with cough/cold symptoms AND [2] present > 5 days  Answer Assessment - Initial Assessment Questions Takes Aleve  and Zyrtec and Flonase .   1. ONSET: When did the throat start hurting? (Hours or days ago)      Couple days ago, but feeling the worst today  2. SEVERITY: How bad is the sore throat? (Scale 1-10; mild, moderate or severe)     Moderate  3. STREP EXPOSURE: Has there been any exposure to strep within the past week? If Yes, ask: What type of contact occurred?      Yes, coworker has strep  4.  VIRAL SYMPTOMS: Are there any symptoms of a cold, such as a runny nose, cough, hoarse voice or red eyes?      Congested, stuffed nose, sinus pressure  5. FEVER: Do you have a fever? If Yes, ask: What is your temperature, how was it measured, and when did it start?     Feels hot and cold, but has not taken temperature  6. PUS ON THE TONSILS: Is there pus on the tonsils in the back of your throat?     Unsure, have not been able to see but glands are swollen  7. OTHER SYMPTOMS: Do you have any other symptoms? (e.g., difficulty breathing, headache, rash)     Denies, head hurts slightly from sinus pressure  Protocols used: Sore Throat-A-AH  Copied from CRM #[id number]. Topic: Clinical - Red Word Triage >> Nov 19, 2023 11:49 AM Wess RAMAN wrote: Red Word that prompted transfer to Nurse Triage: Patient states she really feels bad. One of her coworkers has strep throat.  Chest congestion, sore throat,  cough, runny nose and stuffy, headache, sinus pressure.

## 2023-11-19 NOTE — Telephone Encounter (Signed)
Please give her appointment

## 2023-11-20 ENCOUNTER — Ambulatory Visit: Admitting: Nurse Practitioner

## 2023-11-20 VITALS — BP 128/72 | HR 67 | Temp 98.2°F | Resp 16 | Ht 61.0 in | Wt 173.0 lb

## 2023-11-20 DIAGNOSIS — R6889 Other general symptoms and signs: Secondary | ICD-10-CM

## 2023-11-20 DIAGNOSIS — H6021 Malignant otitis externa, right ear: Secondary | ICD-10-CM | POA: Diagnosis not present

## 2023-11-20 DIAGNOSIS — R051 Acute cough: Secondary | ICD-10-CM | POA: Diagnosis not present

## 2023-11-20 DIAGNOSIS — J029 Acute pharyngitis, unspecified: Secondary | ICD-10-CM | POA: Diagnosis not present

## 2023-11-20 LAB — POC COVID19/FLU A&B COMBO
Covid Antigen, POC: NEGATIVE
Influenza A Antigen, POC: NEGATIVE
Influenza B Antigen, POC: NEGATIVE

## 2023-11-20 LAB — POCT RAPID STREP A (OFFICE): Rapid Strep A Screen: NEGATIVE

## 2023-11-20 MED ORDER — AZITHROMYCIN 500 MG PO TABS: 500.0000 mg | ORAL_TABLET | Freq: Every day | ORAL | 0 refills | Status: AC

## 2023-11-20 MED ORDER — PROMETHAZINE-DM 6.25-15 MG/5ML PO SYRP: 5.0000 mL | ORAL_SOLUTION | Freq: Four times a day (QID) | ORAL | 0 refills | Status: DC | PRN

## 2023-11-20 MED ORDER — BENZONATATE 100 MG PO CAPS: 200.0000 mg | ORAL_CAPSULE | Freq: Two times a day (BID) | ORAL | 0 refills | Status: DC | PRN

## 2023-11-20 NOTE — Progress Notes (Signed)
 BP 128/72   Pulse 67   Temp 98.2 F (36.8 C)   Resp 16   Ht 5' 1 (1.549 m)   Wt 173 lb (78.5 kg)   LMP 12/24/2015 (Approximate)   SpO2 99%   BMI 32.69 kg/m    Subjective:    Patient ID: Nichole Wells, female    DOB: 1960-10-07, 63 y.o.   MRN: [id number]  HPI: Nichole Wells is a 63 y.o. female  Chief Complaint  Patient presents with   Sore Throat    X3 days, L side hurts worse.   Headache   Discussed the use of AI scribe software for clinical note transcription with the patient, who gave verbal consent to proceed.  History of Present Illness [name] is a 63 year old female who presents with a sore throat and congestion.  Pharyngitis and lymphadenopathy - Sore throat for 3 days, worse on the left side - No fever, but feels feverish - Swollen lymph nodes present - Concern for strep exposure due to close contact with individuals who tested positive  Upper respiratory symptoms - Congestion and sinus pressure - Cough present - Mild ear pain, particularly in the right ear  Symptom management - Taking Aleve , Zyrtec, and Flonase  for symptom relief         11/20/2023    1:20 PM 07/28/2023    8:05 AM 07/16/2023    3:01 PM  Depression screen PHQ 2/9  Decreased Interest 0 0 0  Down, Depressed, Hopeless 0 0 0  PHQ - 2 Score 0 0 0  Altered sleeping 0 0 0  Tired, decreased energy 0 0 0  Change in appetite 0 0 0  Feeling bad or failure about yourself  0 0 0  Trouble concentrating 0 0 0  Moving slowly or fidgety/restless 0 0 0  Suicidal thoughts 0 0 0  PHQ-9 Score 0 0 0  Difficult doing work/chores  Not difficult at all Not difficult at all    Relevant past medical, surgical, family and social history reviewed and updated as indicated. Interim medical history since our last visit reviewed. Allergies and medications reviewed and updated.  Review of Systems  Ten systems reviewed and is negative except as mentioned in HPI      Objective:      BP 128/72    Pulse 67   Temp 98.2 F (36.8 C)   Resp 16   Ht 5' 1 (1.549 m)   Wt 173 lb (78.5 kg)   LMP 12/24/2015 (Approximate)   SpO2 99%   BMI 32.69 kg/m    Wt Readings from Last 3 Encounters:  11/20/23 173 lb (78.5 kg)  07/28/23 174 lb 4.8 oz (79.1 kg)  07/16/23 175 lb 3.2 oz (79.5 kg)    Physical Exam GENERAL: Alert, cooperative, well developed, no acute distress HEENT: Normocephalic, erythematous throat with erythematous with  exudate, right otitis externa, moist mucous membranes CHEST: Clear to auscultation bilaterally, no wheezes, rhonchi, or crackles CARDIOVASCULAR: Normal heart rate and rhythm, S1 and S2 normal without murmurs ABDOMEN: Soft, non-tender, non-distended, without organomegaly, normal bowel sounds EXTREMITIES: No cyanosis or edema NEUROLOGICAL: Cranial nerves grossly intact, moves all extremities without gross motor or sensory deficit  Results for orders placed or performed in visit on 11/20/23  POCT rapid strep A   Collection Time: 11/20/23  1:25 PM  Result Value Ref Range   Rapid Strep A Screen Negative Negative  POC Covid19/Flu A&B Antigen   Collection Time: 11/20/23  1:27  PM  Result Value Ref Range   Influenza A Antigen, POC Negative Negative   Influenza B Antigen, POC Negative Negative   Covid Antigen, POC Negative Negative          Assessment & Plan:   Problem List Items Addressed This Visit   None Visit Diagnoses       Sore throat    -  Primary   Relevant Medications   azithromycin  (ZITHROMAX ) 500 MG tablet   Other Relevant Orders   POCT rapid strep A (Completed)     Flu-like symptoms       Relevant Orders   POC Covid19/Flu A&B Antigen (Completed)     Acute cough       Relevant Medications   benzonatate  (TESSALON ) 100 MG capsule   promethazine -dextromethorphan (PROMETHAZINE -DM) 6.25-15 MG/5ML syrup     Acute malignant otitis externa of right ear       Relevant Medications   azithromycin  (ZITHROMAX ) 500 MG tablet     Pharyngitis,  unspecified etiology       Relevant Medications   azithromycin  (ZITHROMAX ) 500 MG tablet        Assessment and Plan Assessment & Plan Acute pharyngitis (suspected streptococcal) Sore throat for three days, more painful on the left side. Negative rapid strep test, but clinical presentation and contact with individuals positive for strep suggest streptococcal pharyngitis. No fever reported, but she feels feverish. Allergy to penicillin necessitates alternative antibiotic. - Prescribe azithromycin  500 mg once daily for five days. - Advise hydration.  Right otitis externa Right ear pain with throat redness and exudate, suggesting possible infection extension to the ear. Azithromycin  chosen to cover both throat and ear infections. - Prescribe azithromycin  500 mg once daily for five days.  Cough Cough likely related to upper respiratory infection. She requests treatment for cough. - Prescribe Tessalon  Perles for cough relief. - Prescribe Phenergan  DM syrup, noting potential drowsiness and aid in rest.  Congestion and sinus pressure Congestion and sinus pressure likely related to upper respiratory infection. Currently taking Aleve , Zyrtec, and Flonase  for symptom management.  Allergy to penicillin Known allergy to penicillin, requiring alternative antibiotic for suspected streptococcal pharyngitis and otitis externa. - Avoid prescribing penicillin.   Recommend taking zyrtec, flonase , mucinex, vitamin d , vitamin c, and zinc . Push fluids and get rest.       Follow up plan: Return if symptoms worsen or fail to improve.

## 2024-01-17 ENCOUNTER — Other Ambulatory Visit: Payer: Self-pay | Admitting: Family Medicine

## 2024-01-17 DIAGNOSIS — I700 Atherosclerosis of aorta: Secondary | ICD-10-CM

## 2024-01-17 DIAGNOSIS — I10 Essential (primary) hypertension: Secondary | ICD-10-CM

## 2024-01-19 NOTE — Telephone Encounter (Signed)
 Requested Prescriptions  Pending Prescriptions Disp Refills   metoprolol  succinate (TOPROL -XL) 50 MG 24 hr tablet [Pharmacy Med Name: METOPROLOL  SUCC ER 50 MG TAB] 90 tablet 0    Sig: TAKE 1 TABLET BY MOUTH DAILY. TAKE WITH OR IMMEDIATELY FOLLOWING A MEAL.     Cardiovascular:  Beta Blockers Passed - 01/19/2024  2:42 PM      Passed - Last BP in normal range    BP Readings from Last 1 Encounters:  11/20/23 128/72         Passed - Last Heart Rate in normal range    Pulse Readings from Last 1 Encounters:  11/20/23 67         Passed - Valid encounter within last 6 months    Recent Outpatient Visits           2 months ago Sore throat   [facility] Gareth Clarity F, FNP   5 months ago Centrilobular emphysema [facility])   [facility] [place], Dorette, MD   6 months ago Non-recurrent acute suppurative otitis media of both ears without spontaneous rupture of tympanic membranes   [facility] Gareth Clarity FALCON, FNP   7 months ago Well adult exam   [facility] Glen[place] Dorette, MD       Future Appointments             In 1 week Sowles, Krichna, MD [facility], [place]   In 5 months Sowles, Krichna, MD [facility], [place]             rosuvastatin  (CRESTOR ) 40 MG tablet [Pharmacy Med Name: ROSUVASTATIN  CALCIUM  40 MG TAB] 90 tablet 0    Sig: TAKE 1 TABLET BY MOUTH EVERY DAY     Cardiovascular:  Antilipid - Statins 2 Failed - 01/19/2024  2:42 PM      Failed - Lipid Panel in normal range within the last 12 months    Cholesterol, Total  Date Value Ref Range Status  04/10/2015 166 100 - 199 mg/dL Final   Cholesterol  Date Value Ref Range Status  06/19/2023 119 <200 mg/dL Final   LDL Cholesterol (Calc)  Date Value Ref Range Status  06/19/2023 47 mg/dL (calc) Final    Comment:    Reference range: <100 . Desirable range  <100 mg/dL for primary prevention;   <70 mg/dL for patients with CHD or diabetic patients  with > or = 2 CHD risk factors. SABRA LDL-C is now calculated using the Martin-Hopkins  calculation, which is a validated novel method providing  better accuracy than the Friedewald equation in the  estimation of LDL-C.  Gladis APPLETHWAITE et al. SANDREA. 7986;689(80): 2061-2068  (http://education.[employer].com/faq/FAQ164)    HDL  Date Value Ref Range Status  06/19/2023 56 > OR = 50 mg/dL Final  97/71/7982 53 >60 mg/dL Final   Triglycerides  Date Value Ref Range Status  06/19/2023 84 <150 mg/dL Final         Passed - Cr in normal range and within 360 days    Creat  Date Value Ref Range Status  06/19/2023 0.58 0.50 - 1.05 mg/dL Final   Creatinine, Urine  Date Value Ref Range Status  07/24/2021 75 20 - 275 mg/dL Final         Passed - Patient is not pregnant      Passed - Valid encounter within last 12 months    Recent Outpatient  Visits           2 months ago Sore throat   [facility] Gareth Clarity F, FNP   5 months ago Centrilobular emphysema [facility])   [place] [facility] [place], Dorette, MD   6 months ago Non-recurrent acute suppurative otitis media of both ears without spontaneous rupture of tympanic membranes   [facility] Gareth Clarity FALCON, FNP   7 months ago Well adult exam   [facility] Glen[place] Dorette, MD       Future Appointments             In 1 week Sowles, Krichna, MD [facility] [place], [place]   In 5 months Sowles, Krichna, MD [facility], [place]             Olmesartan -amLODIPine -HCTZ 40-5-12.5 MG TABS [Pharmacy Med Name: OLMSRTN-AMLDPN-HCTZ 40-5-12.5] 90 tablet 0    Sig: TAKE 1 TABLET BY MOUTH DAILY AT 12 NOON.     Cardiovascular: CCB + ARB + Diuretic Combos Failed - 01/19/2024  2:42 PM      Failed - K in  normal range and within 180 days    Potassium  Date Value Ref Range Status  06/19/2023 4.5 3.5 - 5.3 mmol/L Final  04/27/2012 3.3 (L) 3.5 - 5.1 mmol/L Final         Failed - Na in normal range and within 180 days    Sodium  Date Value Ref Range Status  06/19/2023 138 135 - 146 mmol/L Final  11/14/2020 140 134 - 144 mmol/L Final  04/27/2012 136 136 - 145 mmol/L Final         Failed - Cr in normal range and within 180 days    Creat  Date Value Ref Range Status  06/19/2023 0.58 0.50 - 1.05 mg/dL Final   Creatinine, Urine  Date Value Ref Range Status  07/24/2021 75 20 - 275 mg/dL Final         Failed - eGFR is 10 or above and within 180 days    GFR, Est African American  Date Value Ref Range Status  07/12/2020 124 > OR = 60 mL/min/1.5m2 Final   GFR, Est Non African American  Date Value Ref Range Status  07/12/2020 107 > OR = 60 mL/min/1.70m2 Final   GFR, Estimated  Date Value Ref Range Status  09/26/2021 >60 >60 mL/min Final    Comment:    (NOTE) Calculated using the CKD-EPI Creatinine Equation (2021)    eGFR  Date Value Ref Range Status  06/19/2023 104 > OR = 60 mL/min/1.13m2 Final  11/14/2020 105 >59 mL/min/1.73 Final         Passed - Patient is not pregnant      Passed - Last BP in normal range    BP Readings from Last 1 Encounters:  11/20/23 128/72         Passed - Last Heart Rate in normal range    Pulse Readings from Last 1 Encounters:  11/20/23 67         Passed - Valid encounter within last 6 months    Recent Outpatient Visits           2 months ago Sore throat   [facility] Gareth Clarity FALCON, FNP   5 months ago Centrilobular emphysema [facility])   [facility] [place] Dorette, MD   6 months ago Non-recurrent acute  suppurative otitis media of both ears without spontaneous rupture of tympanic membranes   [facility] Gareth Mliss FALCON, FNP   7 months ago Well adult exam    [facility] [place] Mire, MD       Future Appointments             In 1 week Sowles, Krichna, MD [facility], [place]   In 5 months Sowles, Krichna, MD [facility], [place]

## 2024-01-27 ENCOUNTER — Encounter: Payer: Self-pay | Admitting: Family Medicine

## 2024-01-27 ENCOUNTER — Ambulatory Visit: Admitting: Family Medicine

## 2024-01-27 VITALS — BP 118/68 | HR 69 | Resp 16 | Ht 61.0 in | Wt 177.2 lb

## 2024-01-27 DIAGNOSIS — E538 Deficiency of other specified B group vitamins: Secondary | ICD-10-CM

## 2024-01-27 DIAGNOSIS — M1380 Other specified arthritis, unspecified site: Secondary | ICD-10-CM

## 2024-01-27 DIAGNOSIS — E7800 Pure hypercholesterolemia, unspecified: Secondary | ICD-10-CM | POA: Diagnosis not present

## 2024-01-27 DIAGNOSIS — K219 Gastro-esophageal reflux disease without esophagitis: Secondary | ICD-10-CM | POA: Diagnosis not present

## 2024-01-27 DIAGNOSIS — M545 Low back pain, unspecified: Secondary | ICD-10-CM

## 2024-01-27 DIAGNOSIS — I10 Essential (primary) hypertension: Secondary | ICD-10-CM | POA: Diagnosis not present

## 2024-01-27 DIAGNOSIS — F3340 Major depressive disorder, recurrent, in remission, unspecified: Secondary | ICD-10-CM

## 2024-01-27 DIAGNOSIS — M5412 Radiculopathy, cervical region: Secondary | ICD-10-CM | POA: Diagnosis not present

## 2024-01-27 DIAGNOSIS — G8929 Other chronic pain: Secondary | ICD-10-CM

## 2024-01-27 DIAGNOSIS — I700 Atherosclerosis of aorta: Secondary | ICD-10-CM

## 2024-01-27 DIAGNOSIS — J432 Centrilobular emphysema: Secondary | ICD-10-CM | POA: Diagnosis not present

## 2024-01-27 MED ORDER — METOPROLOL SUCCINATE ER 50 MG PO TB24: 50.0000 mg | ORAL_TABLET | Freq: Every day | ORAL | 1 refills | Status: AC

## 2024-01-27 MED ORDER — CELECOXIB 100 MG PO CAPS: 100.0000 mg | ORAL_CAPSULE | Freq: Two times a day (BID) | ORAL | 1 refills | Status: AC

## 2024-01-27 MED ORDER — TRAMADOL HCL 50 MG PO TABS: 50.0000 mg | ORAL_TABLET | Freq: Every day | ORAL | 0 refills | Status: DC

## 2024-01-27 MED ORDER — OMEPRAZOLE 20 MG PO CPDR: 20.0000 mg | DELAYED_RELEASE_CAPSULE | Freq: Every day | ORAL | 1 refills | Status: AC

## 2024-01-27 MED ORDER — ROSUVASTATIN CALCIUM 40 MG PO TABS: 40.0000 mg | ORAL_TABLET | Freq: Every day | ORAL | 1 refills | Status: AC

## 2024-01-27 MED ORDER — SPIRIVA RESPIMAT 2.5 MCG/ACT IN AERS: 2.0000 | INHALATION_SPRAY | Freq: Every day | RESPIRATORY_TRACT | 1 refills | Status: AC

## 2024-01-27 MED ORDER — OLMESARTAN-AMLODIPINE-HCTZ 40-5-12.5 MG PO TABS: 1.0000 | ORAL_TABLET | Freq: Every day | ORAL | 1 refills | Status: AC

## 2024-01-27 NOTE — Progress Notes (Signed)
 Name: Nichole Wells   MRN: [id number]    DOB: 09-Apr-1960   Date:01/27/2024       Progress Note  Subjective  Chief Complaint  Chief Complaint  Patient presents with   Medical Management of Chronic Issues   Discussed the use of AI scribe software for clinical note transcription with the patient, who gave verbal consent to proceed.  History of Present Illness Nichole Wells is a 63 year old female with chronic neck pain and emphysema who presents for a six-month follow-up.  She experiences persistent neck pain radiating down her left arm, accompanied by numbness and tingling from the shoulder to the hand. The pain is continuous and has worsened since her last visit in June. She also experiences weakness in her hand, leading to dropping objects. She takes Aleve  daily, which provides some relief, but has not picked up her prescribed lidocaine  patches. She occasionally uses gabapentin  and baclofen  for pain management. She has not had an MRI of her neck, but she had an x-ray in 2022.  She reports a new pain on the right side of her neck, present for about two weeks, described as tender and different from her usual neck pain. No fever, chills, or sore throat.  She has a history of seronegative inflammatory arthritis and previously took methotrexate, which was helpful. Her current rheumatologist did not recommend resuming it. There is a family history of rheumatoid arthritis.  She has hypertension, managed with metoprolol , olmesartan , amlodipine , and HCTZ. Her blood pressure is well-controlled, but she sometimes feels tired.  She has emphysema and still smokes She is trying to quit and has reduced smoking to less than a quarter pack per day. She uses Spiriva  for her breathing and has a lung scan scheduled for cancer screening. She experiences occasional shortness of breath and wheezing. She states tried Trelegy in the past but not covered by insurance. She will let us  know if insurance will now approve  it   She has hypercholesterolemia, managed with rosuvastatin , which she takes daily.    Patient Active Problem List   Diagnosis Date Noted   Cervical radiculopathy 07/28/2023   Heart palpitations 10/23/2021   Lumbosacral facet hypertrophy ( L3-4, L4-5, and L5-S1) (Bilateral) 10/08/2021    Class: Chronic   Perennial allergic rhinitis 08/26/2021   Essential hypertension 07/24/2021   Vitamin B12 deficiency 07/24/2021   Abnormal MRI, lumbar spine (06/03/2014 & 9/9 /2023) 06/13/2021   Low back pain of over 3 months duration 05/16/2021   Spondylosis without myelopathy or radiculopathy, lumbosacral region 05/16/2021   DDD (degenerative disc disease), cervical (Multilevel) 01/14/2021   Cervical facet arthropathy (Multilevel) (Bilateral) 01/14/2021   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 01/14/2021   Retrolisthesis of lumbar spine at L1/L2 and L2/L3 01/14/2021   Lumbosacral facet arthropathy (Multilevel) (Bilateral) 01/14/2021   Osteoarthritis of hips (Bilateral) 01/14/2021   Osteoarthritis of wrist (Right) 01/14/2021   Osteoarthritis of first carpometacarpal joint of hand (Right) 01/14/2021   Chronic pain syndrome 11/14/2020   Disorder of skeletal system 11/14/2020   Problems influencing health status 11/14/2020   Chronic left-sided low back pain without sciatica 11/14/2020    Class: Chronic   Lumbosacral facet syndrome (Bilateral) 11/14/2020   Chronic wrist pain (2ry area of Pain) (Right) 11/14/2020   History of carpal tunnel surgery of wrist (Right) 11/14/2020   Chronic neck pain (3ry area of Pain) (Bilateral) (L>R) 11/14/2020   Cervicalgia 11/14/2020   Cervical facet syndrome 11/14/2020   Chronic hip pain (4th area  of Pain) (Bilateral) 11/14/2020   Recurrent major depressive disorder, in remission 02/18/2018   Thoracic aorta atherosclerosis 11/10/2017   Centrilobular emphysema (HCC) 11/10/2017   Abnormal liver CT 11/10/2017   Seronegative inflammatory arthritis  05/07/2017   Tobacco use 05/07/2017   Vitamin D  deficiency 05/28/2016   Surgical menopause on hormone replacement therapy 04/23/2016   Status post laparoscopic assisted vaginal hysterectomy (LAVH) 01/28/2016   Family history of ovarian cancer 11/13/2015   Insomnia 03/27/2015   Hypertension, benign 08/30/2014   Anxiety and depression 08/30/2014   Cardiac murmur 08/30/2014   HLD (hyperlipidemia) 08/30/2014   DDD (degenerative disc disease), lumbar 07/26/2014    Past Surgical History:  Procedure Laterality Date   BREAST BIOPSY Left    stereo- neg   BREAST CYST ASPIRATION Right    neg   BREAST SURGERY     benign biopsy   CHOLECYSTECTOMY     COLONOSCOPY WITH PROPOFOL  N/A 10/11/2019   Procedure: COLONOSCOPY WITH PROPOFOL ;  Surgeon: Therisa Bi, MD;  Location: [facility] ENDOSCOPY;  Service: Gastroenterology;  Laterality: N/A;   COLONOSCOPY WITH PROPOFOL  N/A 06/26/2021   Procedure: COLONOSCOPY WITH PROPOFOL ;  Surgeon: Therisa Bi, MD;  Location: [facility] ENDOSCOPY;  Service: Gastroenterology;  Laterality: N/A;   DILATION AND CURETTAGE OF UTERUS     LAPAROSCOPIC LYSIS OF ADHESIONS  12/10/2015   Procedure: LAPAROSCOPIC LYSIS OF ADHESIONS;  Surgeon: Gladis DELENA Dollar, MD;  Location: [facility] ORS;  Service: Gynecology;;   LAPAROSCOPIC VAGINAL HYSTERECTOMY WITH SALPINGO OOPHORECTOMY Bilateral 01/28/2016   Procedure: LAPAROSCOPIC ASSISTED VAGINAL HYSTERECTOMY WITH SALPINGO OOPHORECTOMY;  Surgeon: Gladis DELENA Dollar, MD;  Location: [facility] ORS;  Service: Gynecology;  Laterality: Bilateral;   LAPAROSCOPY N/A 12/10/2015   Procedure: LAPAROSCOPY DIAGNOSTIC WITH BIOPSIES;  Surgeon: Gladis DELENA Dollar, MD;  Location: [facility] ORS;  Service: Gynecology;  Laterality: N/A;   SHOULDER ARTHROSCOPY Right    TUBAL LIGATION     UTERINE FIBROID EMBOLIZATION      Family History  Problem Relation Age of Onset   Cancer Mother        cervical, lung   Lung cancer Mother    Heart disease Father    Hypertension Father     COPD Father    Spina bifida Sister    Diabetes Maternal Grandmother    Heart disease Maternal Grandmother    Parkinson's disease Paternal Grandfather    Diabetes Son    Breast cancer Maternal Aunt    Ovarian cancer Maternal Aunt     Social History   Tobacco Use   Smoking status: Every Day    Current packs/day: 0.50    Average packs/day: 1 pack/day for 36.3 years (35.0 ttl pk-yrs)    Types: Cigarettes    Start date: 10/06/1987   Smokeless tobacco: Never  Substance Use Topics   Alcohol use: Yes    Alcohol/week: 0.0 standard drinks of alcohol    Comment: none last 24hr    Current Medications[1]  Allergies[2]  I personally reviewed active problem list, medication list, allergies, family history with the patient/caregiver today.   ROS  Ten systems reviewed and is negative except as mentioned in HPI    Objective Physical Exam VITALS: BP- 118/68 CONSTITUTIONAL: Patient appears well-developed and well-nourished. No distress. HEENT: Head atraumatic, normocephalic, neck supple. Neck non-tender, no masses palpated. Neck pain on lateral movement.  CARDIOVASCULAR: Normal rate, regular rhythm and normal heart sounds. No murmur heard. No BLE edema. PULMONARY: Effort normal and breath sounds normal. No respiratory distress. ABDOMINAL: There is no tenderness or  distention. MUSCULOSKELETAL: Normal gait. Without gross motor or sensory deficit. Normal grip PSYCHIATRIC: Patient has a normal mood and affect. Behavior is normal. Judgment and thought content normal.  Vitals:   01/27/24 0806  BP: 118/68  Pulse: 69  Resp: 16  SpO2: 96%  Weight: 177 lb 3.2 oz (80.4 kg)  Height: 5' 1 (1.549 m)    Body mass index is 33.48 kg/m.  Recent Results (from the past 2160 hours)  POCT rapid strep A     Status: None   Collection Time: 11/20/23  1:25 PM  Result Value Ref Range   Rapid Strep A Screen Negative Negative  POC Covid19/Flu A&B Antigen     Status: None   Collection Time:  11/20/23  1:27 PM  Result Value Ref Range   Influenza A Antigen, POC Negative Negative   Influenza B Antigen, POC Negative Negative   Covid Antigen, POC Negative Negative    Diabetic Foot Exam:     PHQ2/9:    01/27/2024    8:03 AM 11/20/2023    1:20 PM 07/28/2023    8:05 AM 07/16/2023    3:01 PM 06/19/2023    9:09 AM  Depression screen PHQ 2/9  Decreased Interest 0 0 0 0 0  Down, Depressed, Hopeless 0 0 0 0 0  PHQ - 2 Score 0 0 0 0 0  Altered sleeping  0 0 0 0  Tired, decreased energy  0 0 0 0  Change in appetite  0 0 0 0  Feeling bad or failure about yourself   0 0 0 0  Trouble concentrating  0 0 0 0  Moving slowly or fidgety/restless  0 0 0 0  Suicidal thoughts  0 0 0 0  PHQ-9 Score  0  0  0  0   Difficult doing work/chores   Not difficult at all Not difficult at all Not difficult at all     Data saved with a previous flowsheet row definition    phq 9 is negative  Fall Risk:    01/27/2024    8:02 AM 07/28/2023    8:03 AM 07/16/2023    3:00 PM 06/19/2023    9:09 AM 03/10/2023    9:39 AM  Fall Risk   Falls in the past year? 0 1 1 0 0  Number falls in past yr: 0 0 0 0 0  Injury with Fall? 0 1  1  0  0   Risk for fall due to : No Fall Risks Impaired balance/gait  No Fall Risks No Fall Risks  Follow up Falls evaluation completed Education provided;Falls evaluation completed Falls evaluation completed Falls prevention discussed;Education provided;Falls evaluation completed Falls prevention discussed     Data saved with a previous flowsheet row definition      Assessment & Plan Chronic Neck Pain with Radiculopathy Chronic neck pain with left-sided radiculopathy, weakness, and new right anterior neck pain likely due to muscle spasm. Previous x-ray showed arthritis. MRI needed to assess for myelopathy. - Order cervical spine MRI in January to assess for myelopathy and further evaluate neck pain. - Switch from Aleve  to Celebrex  for pain management, with instructions to take  Celebrex  twice daily with food. - Prescribe baclofen  for muscle spasm. - Refer to rheumatologist in [place] for further evaluation of inflammatory arthritis.  Seronegative Inflammatory Arthritis Seronegative inflammatory arthritis with previous methotrexate and Plaquenil  use. Dissatisfaction with current rheumatology care. Referral for second opinion planned. - Refer to rheumatologist in [place] for further  evaluation and discussion of treatment options.  Emphysema with COPD Emphysema and COPD with reduced smoking. Occasional shortness of breath and wheezing. Spiriva  used, considering Trelegy if insurance issues arise. - Continue Spiriva , switch to Spiriva  Respimat if insurance allows. - Consider Trelegy if Spiriva  Respimat is not covered by insurance. - Encourage smoking cessation efforts. - Schedule lung cancer screening scan on January 8th.  Hypertension Hypertension well-controlled with current regimen. Occasional fatigue noted, metoprolol  timing adjustment considered. - Continue current antihypertensive regimen. - Consider reducing metoprolol  dosage if blood pressure or heart rate consistently low. - Advise taking metoprolol  in the evening if it causes daytime fatigue.  Hypercholesterolemia Hypercholesterolemia managed with rosuvastatin  without issues. - Continue rosuvastatin  as prescribed.  General Health Maintenance Discussion on weight management and healthy eating during holidays. - Advise on healthy eating habits and weight management post-holidays.        [1]  Current Outpatient Medications:    Albuterol -Budesonide (AIRSUPRA ) 90-80 MCG/ACT AERO, Inhale 2 puffs into the lungs 4 (four) times daily as needed., Disp: 10.7 g, Rfl: 2   baclofen  (LIORESAL ) 10 MG tablet, TAKE 0.5-1 TABLETS BY MOUTH 3 TIMES DAILY AS NEEDED FOR MUSCLE SPASMS., Disp: 90 tablet, Rfl: 1   benzonatate  (TESSALON ) 100 MG capsule, Take 2 capsules (200 mg total) by mouth 2 (two) times daily as  needed for cough., Disp: 20 capsule, Rfl: 0   cetirizine (ZYRTEC) 10 MG tablet, Take 10 mg by mouth daily., Disp: , Rfl:    Cyanocobalamin  (VITAMIN B-12) 500 MCG SUBL, Place 1 tablet (500 mcg total) under the tongue daily at 12 noon., Disp: 100 tablet, Rfl: 1   FLUoxetine  (PROZAC ) 40 MG capsule, Take 1 capsule (40 mg total) by mouth daily., Disp: 90 capsule, Rfl: 1   fluticasone  (FLONASE ) 50 MCG/ACT nasal spray, SPRAY 2 SPRAYS INTO EACH NOSTRIL EVERY DAY, Disp: 48 mL, Rfl: 0   gabapentin  (NEURONTIN ) 300 MG capsule, Take 1 capsule (300 mg total) by mouth at bedtime., Disp: 90 capsule, Rfl: 1   hydrOXYzine  (ATARAX ) 10 MG tablet, Take 1-2 tablets (10-20 mg total) by mouth 3 (three) times daily as needed., Disp: 30 tablet, Rfl: 0   metoprolol  succinate (TOPROL -XL) 50 MG 24 hr tablet, TAKE 1 TABLET BY MOUTH DAILY. TAKE WITH OR IMMEDIATELY FOLLOWING A MEAL., Disp: 90 tablet, Rfl: 0   Olmesartan -amLODIPine -HCTZ 40-5-12.5 MG TABS, TAKE 1 TABLET BY MOUTH DAILY AT 12 NOON., Disp: 90 tablet, Rfl: 0   promethazine -dextromethorphan (PROMETHAZINE -DM) 6.25-15 MG/5ML syrup, Take 5 mLs by mouth 4 (four) times daily as needed for cough., Disp: 118 mL, Rfl: 0   rosuvastatin  (CRESTOR ) 40 MG tablet, TAKE 1 TABLET BY MOUTH EVERY DAY, Disp: 90 tablet, Rfl: 0   tiotropium (SPIRIVA  HANDIHALER) 18 MCG inhalation capsule, Place 1 capsule (18 mcg total) into inhaler and inhale daily., Disp: 90 capsule, Rfl: 1   traMADol  (ULTRAM ) 50 MG tablet, Take 1-2 tablets (50-100 mg total) by mouth at bedtime., Disp: 30 tablet, Rfl: 0   Triamcinolone  Acetonide 0.025 % LOTN, Apply 1 each topically in the morning and at bedtime., Disp: 60 mL, Rfl: 0   Vitamin D , Cholecalciferol, 50 MCG (2000 UT) CAPS, Take by mouth daily., Disp: , Rfl:  [2]  Allergies Allergen Reactions   Hydroxychloroquine  Itching    Pt take zyrtec to alleviate symptoms    Penicillin G Hives    Has patient had a PCN reaction causing immediate rash, facial/tongue/throat  swelling, SOB or lightheadedness with hypotension:unsure Has patient had a PCN reaction causing severe  rash involving mucus membranes or skin necrosis:unsure Has patient had a PCN reaction that required hospitalization:unsure Has patient had a PCN reaction occurring within the last 10 years:No If all of the above answers are NO, then may proceed with Cephalosporin use.

## 2024-02-06 ENCOUNTER — Ambulatory Visit
Admission: RE | Admit: 2024-02-06 | Discharge: 2024-02-06 | Disposition: A | Source: Ambulatory Visit | Attending: Family Medicine | Admitting: Family Medicine

## 2024-02-06 DIAGNOSIS — M5412 Radiculopathy, cervical region: Secondary | ICD-10-CM | POA: Insufficient documentation

## 2024-02-07 ENCOUNTER — Other Ambulatory Visit: Payer: Self-pay | Admitting: Family Medicine

## 2024-02-07 DIAGNOSIS — J3089 Other allergic rhinitis: Secondary | ICD-10-CM

## 2024-02-07 DIAGNOSIS — F3340 Major depressive disorder, recurrent, in remission, unspecified: Secondary | ICD-10-CM

## 2024-02-09 NOTE — Telephone Encounter (Signed)
 Requested Prescriptions  Pending Prescriptions Disp Refills   FLUoxetine  (PROZAC ) 40 MG capsule [Pharmacy Med Name: FLUOXETINE  HCL 40 MG CAPSULE] 90 capsule 1    Sig: TAKE 1 CAPSULE (40 MG TOTAL) BY MOUTH DAILY.     Psychiatry:  Antidepressants - SSRI Passed - 02/09/2024 12:08 PM      Passed - Completed PHQ-2 or PHQ-9 in the last 360 days      Passed - Valid encounter within last 6 months    Recent Outpatient Visits           1 week ago Cervical radiculopathy   [facility] Glenard Mire, MD   2 months ago Sore throat   [facility] Gareth Mliss FALCON, FNP   6 months ago Centrilobular emphysema [facility])   [place] [facility] Glenard Mire, MD   6 months ago Non-recurrent acute suppurative otitis media of both ears without spontaneous rupture of tympanic membranes   [facility] Gareth Mliss FALCON, FNP   7 months ago Well adult exam   [facility] Glenard Mire, MD       Future Appointments             In 4 months Glenard, Krichna, MD [facility] [place]   In 4 months Dolphus Reiter, MD [facility] Rheumatology - A Dept Of [place]. [facility]             fluticasone  (FLONASE ) 50 MCG/ACT nasal spray [Pharmacy Med Name: FLUTICASONE  PROP 50 MCG SPRAY] 48 mL 0    Sig: SPRAY 2 SPRAYS INTO EACH NOSTRIL EVERY DAY     Ear, Nose, and Throat: Nasal Preparations - Corticosteroids Passed - 02/09/2024 12:08 PM      Passed - Valid encounter within last 12 months    Recent Outpatient Visits           1 week ago Cervical radiculopathy   [facility] Glenard Mire, MD   2 months ago Sore throat   [facility] Gareth Mliss FALCON, FNP   6 months ago Centrilobular emphysema [facility])   [facility] Glenard Mire, MD   6 months ago Non-recurrent  acute suppurative otitis media of both ears without spontaneous rupture of tympanic membranes   [facility] Gareth Mliss FALCON, FNP   7 months ago Well adult exam   [facility] Glenard Mire, MD       Future Appointments             In 4 months Glenard, Krichna, MD [facility] [place]   In 4 months Dolphus Reiter, MD [facility] Rheumatology - A Dept Of [place]. [facility]

## 2024-02-12 ENCOUNTER — Other Ambulatory Visit: Payer: Self-pay | Admitting: Medical Genetics

## 2024-02-15 ENCOUNTER — Ambulatory Visit: Payer: Self-pay | Admitting: Family Medicine

## 2024-02-16 NOTE — Telephone Encounter (Signed)
 I spoke with Nichole Wells and relayed that she should go to the ER if she develops any new numbness, weakness, imbalance (per Dr Elliot recommendation). I scheduled her with Lyle next Monday 02/22/24 (she currently has flu like symptoms).

## 2024-02-17 ENCOUNTER — Ambulatory Visit: Payer: Self-pay

## 2024-02-17 NOTE — Telephone Encounter (Signed)
 Appt sch'd with Dr Bernardo for 02/18/24

## 2024-02-17 NOTE — Telephone Encounter (Signed)
 FYI Only or Action Required?: Action required by provider: Refusing ED, requesting appt, needs call back, alerted CAL to ED refusal.  Patient was last seen in primary care on 01/27/2024 by Glenard Mire, MD.  Called Nurse Triage reporting Cough.  Symptoms began several days ago.  Interventions attempted: Rest, hydration, or home remedies.  Symptoms are: gradually improving.  Triage Disposition: Go to ED Now (or PCP Triage)  Patient/caregiver understands and will follow disposition?: No, refuses disposition     Pt husband called in to report symptoms for himself and wife.  Reason for Disposition  [1] Fever > 100 F (37.8 C) AND [2] diabetes mellitus or weak immune system (e.g., HIV positive, cancer chemo, splenectomy, organ transplant, chronic steroids)  Answer Assessment - Initial Assessment Questions This RN recommended pt be examined in hospital, pt husband refusing, requesting appt. Pt husband hung up before further care advice could be given. Sending message to PCP office for call back to pt with further recommendations. Alerted CAL to ED refusal.    Speaking to pt husband SOB Not speaking in phrases per husband Smoker and has inhalers here Not sure if been using any of her inhalers She's a little bit better than I am right now No wheezing or harsh sounds when breathing in at this time Negative for covid SOB just with cough Yellow mucus Emphysema No hx blood clot in legs or lungs No fever, fever broke around Monday 101F highest  Protocols used: Breathing Difficulty-A-AH

## 2024-02-17 NOTE — Telephone Encounter (Signed)
Please schedule 1st available

## 2024-02-17 NOTE — Progress Notes (Signed)
 "  Referring Physician:  Sowles, Krichna, MD [address] [place],  [place] [address]  Primary Physician:  Glenard Mire, MD  History of Present Illness: 02/22/2024 Nichole Wells is here today with a chief complaint of chronic neck pain for 4 years that has become worse over the past year.  Pain radiates on both sides to her shoulders and is worse with range of motion..  On her right side it radiates to the front of her neck.  On the left side we will go primarily to the shoulder but she also has pain, numbness and tingling, and weakness into the left hand especially with her grip.  She has noticed that she started to drop things and she adds that she feels as though her walking may be getting worse, but denies any falls.  Denies any weakness in her legs, saddle anesthesia or frank incontinence.  Denies any fever or chills.       Bowel/Bladder Dysfunction: none  Conservative measures:  Physical therapy: Has not participated in Multimodal medical therapy including regular antiinflammatories: Celebrex , Aleve , Baclofen , Tramadol   Injections:  (has had trigger point injections on the left side of the neck/shoulder x years ago) epidural steroid injections  Past Surgery: no spine surgeries   The symptoms are causing a significant impact on the patient's life.   Review of Systems:  A 10 point review of systems is negative, except for the pertinent positives and negatives detailed in the HPI.  Past Medical History: Past Medical History:  Diagnosis Date   Anxiety    Arthritis    poss ra   GERD (gastroesophageal reflux disease)    occ   Heart murmur    Hypertension    Inflammatory arthritis    Vitamin D  deficiency     Past Surgical History: Past Surgical History:  Procedure Laterality Date   BREAST BIOPSY Left    stereo- neg   BREAST CYST ASPIRATION Right    neg   BREAST SURGERY     benign biopsy   CHOLECYSTECTOMY     COLONOSCOPY WITH PROPOFOL  N/A  10/11/2019   Procedure: COLONOSCOPY WITH PROPOFOL ;  Surgeon: Therisa Bi, MD;  Location: [facility] ENDOSCOPY;  Service: Gastroenterology;  Laterality: N/A;   COLONOSCOPY WITH PROPOFOL  N/A 06/26/2021   Procedure: COLONOSCOPY WITH PROPOFOL ;  Surgeon: Therisa Bi, MD;  Location: [facility] ENDOSCOPY;  Service: Gastroenterology;  Laterality: N/A;   DILATION AND CURETTAGE OF UTERUS     LAPAROSCOPIC LYSIS OF ADHESIONS  12/10/2015   Procedure: LAPAROSCOPIC LYSIS OF ADHESIONS;  Surgeon: Gladis DELENA Dollar, MD;  Location: [facility] ORS;  Service: Gynecology;;   LAPAROSCOPIC VAGINAL HYSTERECTOMY WITH SALPINGO OOPHORECTOMY Bilateral 01/28/2016   Procedure: LAPAROSCOPIC ASSISTED VAGINAL HYSTERECTOMY WITH SALPINGO OOPHORECTOMY;  Surgeon: Gladis DELENA Dollar, MD;  Location: [facility] ORS;  Service: Gynecology;  Laterality: Bilateral;   LAPAROSCOPY N/A 12/10/2015   Procedure: LAPAROSCOPY DIAGNOSTIC WITH BIOPSIES;  Surgeon: Gladis DELENA Dollar, MD;  Location: [facility] ORS;  Service: Gynecology;  Laterality: N/A;   SHOULDER ARTHROSCOPY Right    TUBAL LIGATION     UTERINE FIBROID EMBOLIZATION      Allergies: Allergies as of 02/22/2024 - Review Complete 02/22/2024  Allergen Reaction Noted   Hydroxychloroquine  Itching 07/12/2015   Penicillin g Hives 08/30/2014    Medications: Outpatient Encounter Medications as of 02/22/2024  Medication Sig   Albuterol -Budesonide (AIRSUPRA ) 90-80 MCG/ACT AERO Inhale 2 puffs into the lungs 4 (four) times daily as needed.   baclofen  (LIORESAL ) 10 MG tablet TAKE 0.5-1 TABLETS  BY MOUTH 3 TIMES DAILY AS NEEDED FOR MUSCLE SPASMS.   celecoxib  (CELEBREX ) 100 MG capsule Take 1 capsule (100 mg total) by mouth 2 (two) times daily.   cetirizine (ZYRTEC) 10 MG tablet Take 10 mg by mouth daily.   Cyanocobalamin  (VITAMIN B-12) 500 MCG SUBL Place 1 tablet (500 mcg total) under the tongue daily at 12 noon.   FLUoxetine  (PROZAC ) 40 MG capsule TAKE 1 CAPSULE (40 MG TOTAL) BY MOUTH DAILY.   fluticasone  (FLONASE )  50 MCG/ACT nasal spray SPRAY 2 SPRAYS INTO EACH NOSTRIL EVERY DAY   gabapentin  (NEURONTIN ) 300 MG capsule Take 1 capsule (300 mg total) by mouth at bedtime. (Patient taking differently: Take 300 mg by mouth at bedtime. As needed)   hydrOXYzine  (ATARAX ) 10 MG tablet Take 1-2 tablets (10-20 mg total) by mouth 3 (three) times daily as needed.   metoprolol  succinate (TOPROL -XL) 50 MG 24 hr tablet Take 1 tablet (50 mg total) by mouth daily. TAKE WITH OR IMMEDIATELY FOLLOWING A MEAL.   Olmesartan -amLODIPine -HCTZ 40-5-12.5 MG TABS Take 1 tablet by mouth daily at 12 noon.   omeprazole  (PRILOSEC) 20 MG capsule Take 1 capsule (20 mg total) by mouth daily.   rosuvastatin  (CRESTOR ) 40 MG tablet Take 1 tablet (40 mg total) by mouth daily.   Tiotropium Bromide  (SPIRIVA  RESPIMAT) 2.5 MCG/ACT AERS Inhale 2 puffs into the lungs daily.   Triamcinolone  Acetonide 0.025 % LOTN Apply 1 each topically in the morning and at bedtime.   Vitamin D , Cholecalciferol, 50 MCG (2000 UT) CAPS Take by mouth daily.   [DISCONTINUED] traMADol  (ULTRAM ) 50 MG tablet Take 1-2 tablets (50-100 mg total) by mouth at bedtime.   [DISCONTINUED] benzonatate  (TESSALON ) 100 MG capsule Take 1 capsule (100 mg total) by mouth 2 (two) times daily as needed for cough.   [DISCONTINUED] doxycycline  (VIBRA -TABS) 100 MG tablet Take 1 tablet (100 mg total) by mouth 2 (two) times daily for 7 days.   [DISCONTINUED] predniSONE  (DELTASONE ) 20 MG tablet Take 2 tablets (40 mg total) by mouth daily with breakfast for 5 days.   No facility-administered encounter medications on file as of 02/22/2024.    Social History: Social History[1]  Patient does drink alcohol daily.  Family Medical History: Family History  Problem Relation Age of Onset   Cancer Mother        cervical, lung   Lung cancer Mother    Heart disease Father    Hypertension Father    COPD Father    Spina bifida Sister    Diabetes Maternal Grandmother    Heart disease Maternal  Grandmother    Parkinson's disease Paternal Grandfather    Diabetes Son    Breast cancer Maternal Aunt    Ovarian cancer Maternal Aunt     Physical Examination: @VITALWITHPAIN @  General: Patient is well developed, well nourished, calm, collected, and in no apparent distress. Attention to examination is appropriate.  Psychiatric: Patient is non-anxious.  Head:  Pupils equal, round, and reactive to light.  ENT:  Oral mucosa appears well hydrated.  Neck:   Supple.  Some decreased range of motion.  Respiratory: Patient is breathing without any difficulty.  Extremities: No edema.  Vascular: Palpable dorsal pedal pulses.  Skin:   On exposed skin, there are no abnormal skin lesions.  NEUROLOGICAL:     Awake, alert, oriented to person, place, and time.  Speech is clear and fluent. Fund of knowledge is appropriate.   Cranial Nerves: Pupils equal round and reactive to light.  Facial tone  is symmetric.  ROM of spine: Tenderness palpation of cervical paraspinals.    Strength: Patient is at least 4+ to 5 in bilateral upper extremities. Reflexes are 3+ in bilateral biceps and brachioradialis, 2+ in bilateral triceps.  Borderline Hoffmann's.  Clonus is not present.  Gait is largely normal on appearance.   Medical Decision Making  Imaging:   EXAM: MRI CERVICAL SPINE WITHOUT CONTRAST   TECHNIQUE: Multiplanar, multisequence MR imaging of the cervical spine was performed. No intravenous contrast was administered.   COMPARISON:  Prior radiograph from 11/14/2020.   FINDINGS: Alignment: Straightening of the normal cervical lordosis. Trace degenerative facet mediated anterolisthesis of C3 on C4, C6 on C7, and C7 on T1. Underlying mild dextroscoliosis.   Vertebrae: Vertebral body height maintained without acute or chronic fracture. Bone marrow signal intensity within normal limits. No worrisome osseous lesions. Prominent reactive marrow edema present about the right greater  than left C3-4 facets due to active facet arthritis (series 7, images 3, 13).   Cord: Normal signal and morphology.   Posterior Fossa, vertebral arteries, paraspinal tissues: Visualized brain and posterior fossa within normal limits. Probable tiny incidental pars intermedius cyst noted at the pituitary gland. Craniocervical junction normal. Paraspinous soft tissue edema adjacent to the inflamed C3-4 facets, worse on the right. Paraspinous soft tissues demonstrate no other acute finding. Preserved flow voids seen within the vertebral arteries bilaterally.   Disc levels:   C2-C3: Small central disc protrusion minimally indents the ventral thecal sac. Moderate left-sided facet arthrosis. No spinal stenosis. Mild left C3 foraminal narrowing. Right neural foramen remains widely patent.   C3-C4: Trace anterolisthesis. Mild disc bulge with uncovertebral spurring, slightly asymmetric to the left. Moderate bilateral facet arthrosis with reactive edema there is a trace right posterior epidural collection adjacent to the inflamed right C3-4 facet measuring 2-through mm in maximal AP diameter (series 9, image 14). Collection tracks inferiorly to the level of C5. Secondary flattening and mild indentation of the right posterior thecal sac without significant spinal stenosis. Moderate left C4 foraminal narrowing. Right neural foramina remains patent.   C4-C5: Mild disc bulge with bilateral uncovertebral spurring, asymmetric to the left. Moderate right worse than left facet arthrosis. No significant spinal stenosis. Foramina remain patent.   C5-C6: Degenerative intervertebral disc space narrowing. Left eccentric disc osteophyte complex flattens and partially faces the ventral thecal sac. Secondary cord flattening without cord signal changes. Moderate left with mild right facet arthrosis. Resultant mild spinal stenosis. Severe left with mild right C6 foraminal narrowing.   C6-C7: Small  central disc protrusion indents the ventral thecal sac (series 9, image 24). Moderate left with mild right facet arthrosis. No spinal stenosis. Foramina remain patent.   C7-T1: Minimal disc bulge with uncovertebral spurring. Moderate left with mild right facet hypertrophy. No spinal stenosis. Mild left C8 foraminal narrowing. Right neural foramen remains patent.   IMPRESSION: 1. 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 Associated small right posterior epidural collection adjacent to the inflamed right C3-4 facet as above. While these findings are favored to be degenerative in nature with a small benign reactive epidural collection/effusion, possible changes of acute infection with septic arthritis with a small epidural abscess could also have this appearance, and could be considered in the correct clinical setting. Correlation with physical exam and laboratory values recommended. 2. Left eccentric disc osteophyte complex at C5-6 with resultant mild canal and severe left  C6 foraminal stenosis. 3. Additional mild noncompressive disc bulging at C2-3, C4-5, and C6-7 without significant stenosis or impingement.  I have personally reviewed the images and agree with the above interpretation.  Assessment and Plan: Nichole Wells is a pleasant 64 y.o. female with chronic neck pain that has become progressively worse over the past year.  This radiates into bilateral shoulders, left worse than right.  She has also been experiencing numbness and tingling extending into the entirety of her left hand with decreased strength when it comes to her grip.  Most recently on MRI there appeared to be some reactive/paraspinous soft tissue edema at C3-4 that is favored to be degenerative in nature.  Per the reading there is a possibility that this could be infectious in nature.   Patient currently does not have any fevers and chills.   -Even though it is favored to be degenerative in nature, would like repeat MRI with and without contrast of cervical spine to evaluate for any potential change and to ensure no indication of infection - Patient also has a disc at C6 with a significant osteophyte complex at C5-6 which could largely be contributing to her pain however did not have significant weakness in this area and bicep reflex was present.  Would like to see if she has any significant listhesis on extension and flexion of her cervical spine.  X-rays will be completed today while in clinic and I will review once complete. - In the meantime, do think it would be beneficial for patient to undergo physical therapy.  A referral was placed for this as well.     Thank you for involving me in the care of this patient.   I spent a total of 45 minutes in both face-to-face and non-face-to-face activities for this visit on the date of this encounter including preparing to see the patient, obtaining and reviewing separately obtained history, performing medically appropriate examination, counseling the patient and their family, ordering additional medications and tests, documenting clinical information, independently interpreting results, coordination of care.   Lyle Decamp, PA-C Dept. of Neurosurgery     [1]  Social History Tobacco Use   Smoking status: Every Day    Current packs/day: 0.50    Average packs/day: 1 pack/day for 36.4 years (35.0 ttl pk-yrs)    Types: Cigarettes    Start date: 10/06/1987   Smokeless tobacco: Never  Vaping Use   Vaping status: Never Used  Substance Use Topics   Alcohol use: Yes    Comment: occasional   Drug use: No   "

## 2024-02-18 ENCOUNTER — Ambulatory Visit
Admission: RE | Admit: 2024-02-18 | Discharge: 2024-02-18 | Disposition: A | Discharge status: Home / Self Care | Payer: Self-pay | Source: Ambulatory Visit | Attending: Medical Genetics | Admitting: Medical Genetics

## 2024-02-18 ENCOUNTER — Ambulatory Visit
Admission: RE | Admit: 2024-02-18 | Discharge: 2024-02-18 | Disposition: A | Discharge status: Home / Self Care | Source: Ambulatory Visit | Attending: Family Medicine | Admitting: Family Medicine

## 2024-02-18 ENCOUNTER — Other Ambulatory Visit: Payer: Self-pay

## 2024-02-18 ENCOUNTER — Encounter: Payer: Self-pay | Admitting: Internal Medicine

## 2024-02-18 ENCOUNTER — Ambulatory Visit: Admitting: Internal Medicine

## 2024-02-18 ENCOUNTER — Ambulatory Visit: Admitting: Nurse Practitioner

## 2024-02-18 ENCOUNTER — Ambulatory Visit
Admission: RE | Admit: 2024-02-18 | Discharge: 2024-02-18 | Disposition: A | Discharge status: Home / Self Care | Source: Ambulatory Visit | Attending: Acute Care | Admitting: Acute Care

## 2024-02-18 VITALS — BP 122/80 | HR 77 | Temp 98.2°F | Resp 16 | Ht 61.0 in | Wt 172.1 lb

## 2024-02-18 DIAGNOSIS — Z122 Encounter for screening for malignant neoplasm of respiratory organs: Secondary | ICD-10-CM

## 2024-02-18 DIAGNOSIS — F17210 Nicotine dependence, cigarettes, uncomplicated: Secondary | ICD-10-CM

## 2024-02-18 DIAGNOSIS — R051 Acute cough: Secondary | ICD-10-CM

## 2024-02-18 DIAGNOSIS — J441 Chronic obstructive pulmonary disease with (acute) exacerbation: Secondary | ICD-10-CM

## 2024-02-18 DIAGNOSIS — Z87891 Personal history of nicotine dependence: Secondary | ICD-10-CM

## 2024-02-18 DIAGNOSIS — Z1231 Encounter for screening mammogram for malignant neoplasm of breast: Secondary | ICD-10-CM | POA: Insufficient documentation

## 2024-02-18 LAB — POC COVID19/FLU A&B COMBO
Covid Antigen, POC: NEGATIVE
Influenza A Antigen, POC: NEGATIVE
Influenza B Antigen, POC: NEGATIVE

## 2024-02-18 MED ORDER — PREDNISONE 20 MG PO TABS: 40.0000 mg | ORAL_TABLET | Freq: Every day | ORAL | 0 refills | Status: DC

## 2024-02-18 MED ORDER — DOXYCYCLINE HYCLATE 100 MG PO TABS: 100.0000 mg | ORAL_TABLET | Freq: Two times a day (BID) | ORAL | 0 refills | Status: DC

## 2024-02-18 MED ORDER — BENZONATATE 100 MG PO CAPS: 100.0000 mg | ORAL_CAPSULE | Freq: Two times a day (BID) | ORAL | 0 refills | Status: DC | PRN

## 2024-02-18 NOTE — Progress Notes (Signed)
 "  Acute Office Visit  Subjective:     Patient ID: Nichole Wells, female    DOB: October 22, 1960, 64 y.o.   MRN: [id number]  Chief Complaint  Patient presents with   URI    Cough, congestion, headache, chills, fever for 5 days    URI  Associated symptoms include congestion and coughing. Pertinent negatives include no ear pain, sinus pain, sore throat or wheezing.   Patient is in today for cough, congestion, headache x 5 days. This is my first time meeting her.   Discussed the use of AI scribe software for clinical note transcription with the patient, who gave verbal consent to proceed.  History of Present Illness Nichole Wells is a 64 year old female with emphysema who presents with flu-like symptoms and COPD exacerbation.  She began feeling exhausted after her work shift on Friday night, then developed a severe vice grip headache by Saturday along with chills, fever, and fatigue that kept her in bed for three days. She tried to return to work two days ago but had to leave due to persistent fatigue.  She now has cough with occasional yellow sputum, congestion, headache, fever and chills. She has not checked her temperature. She reports shortness of breath and wheezing with any physical activity.  She has emphysema. Recent chest CT showed mild centrilobular emphysema with diffuse bronchial wall thickening. She uses [employer] and Spiriva  Respimat as maintenance inhalers and is usually well controlled on them.  She has taken Coricidin, Mucinex, and Sudafed, with some congestion relief from Sudafed. She is allergic to penicillin. She notes that steroids make her irritable and increase her appetite but she otherwise tolerates them well.   Review of Systems  Constitutional:  Positive for chills and fever.  HENT:  Positive for congestion. Negative for ear pain, sinus pain and sore throat.   Respiratory:  Positive for cough and shortness of breath. Negative for wheezing.          Objective:    BP 122/80 (Cuff Size: Large)   Pulse 77   Temp 98.2 F (36.8 C) (Oral)   Resp 16   Ht 5' 1 (1.549 m)   Wt 172 lb 1.6 oz (78.1 kg)   LMP 12/24/2015   SpO2 98%   BMI 32.52 kg/m  BP Readings from Last 3 Encounters:  02/18/24 122/80  01/27/24 118/68  11/20/23 128/72   Wt Readings from Last 3 Encounters:  02/18/24 172 lb 1.6 oz (78.1 kg)  01/27/24 177 lb 3.2 oz (80.4 kg)  11/20/23 173 lb (78.5 kg)      Physical Exam Constitutional:      Appearance: Normal appearance.  HENT:     Head: Normocephalic and atraumatic.     Right Ear: Tympanic membrane, ear canal and external ear normal.     Left Ear: Tympanic membrane, ear canal and external ear normal.     Nose: Congestion present.     Mouth/Throat:     Mouth: Mucous membranes are moist.     Pharynx: Oropharynx is clear.  Eyes:     Conjunctiva/sclera: Conjunctivae normal.  Cardiovascular:     Rate and Rhythm: Normal rate and regular rhythm.  Pulmonary:     Effort: Pulmonary effort is normal.     Breath sounds: No wheezing, rhonchi or rales.     Comments: Decreased air movement throughout  Skin:    General: Skin is warm and dry.  Neurological:     General: No focal deficit present.  Mental Status: She is alert. Mental status is at baseline.  Psychiatric:        Mood and Affect: Mood normal.        Behavior: Behavior normal.     Results for orders placed or performed in visit on 02/18/24  POC Covid19/Flu A&B Antigen  Result Value Ref Range   Influenza A Antigen, POC Negative Negative   Influenza B Antigen, POC Negative Negative   Covid Antigen, POC Negative Negative        Assessment & Plan:   Assessment & Plan COPD exacerbation Likely viral trigger. CT for lung screening from today shows mild emphysema, bronchial wall thickening. Negative for flu and COVID. Increased pneumonia risk. - Prescribed prednisone  40 mg daily for 5 days. - Prescribed doxycycline  twice daily for 7 days. -  Advised rest and hydration. - Recommended zinc  and vitamin C. - Instructed use of acetaminophen  and anti-inflammatories for fever and chills. - Advised to contact clinic if symptoms worsen or persist beyond 7-10 days.  - POC Covid19/Flu A&B Antigen - predniSONE  (DELTASONE ) 20 MG tablet; Take 2 tablets (40 mg total) by mouth daily with breakfast for 5 days.  Dispense: 10 tablet; Refill: 0 - doxycycline  (VIBRA -TABS) 100 MG tablet; Take 1 tablet (100 mg total) by mouth 2 (two) times daily for 7 days.  Dispense: 14 tablet; Refill: 0 - benzonatate  (TESSALON ) 100 MG capsule; Take 1 capsule (100 mg total) by mouth 2 (two) times daily as needed for cough.  Dispense: 20 capsule; Refill: 0   Return if symptoms worsen or fail to improve.  Sharyle Fischer, DO   "

## 2024-02-22 ENCOUNTER — Encounter: Payer: Self-pay | Admitting: Physician Assistant

## 2024-02-22 ENCOUNTER — Ambulatory Visit

## 2024-02-22 ENCOUNTER — Ambulatory Visit: Admitting: Physician Assistant

## 2024-02-22 VITALS — BP 118/72 | Ht 62.0 in | Wt 168.5 lb

## 2024-02-22 DIAGNOSIS — M4802 Spinal stenosis, cervical region: Secondary | ICD-10-CM

## 2024-02-22 DIAGNOSIS — R9389 Abnormal findings on diagnostic imaging of other specified body structures: Secondary | ICD-10-CM | POA: Diagnosis not present

## 2024-02-24 ENCOUNTER — Encounter: Payer: Self-pay | Admitting: Physician Assistant

## 2024-02-29 ENCOUNTER — Other Ambulatory Visit: Payer: Self-pay

## 2024-02-29 DIAGNOSIS — Z87891 Personal history of nicotine dependence: Secondary | ICD-10-CM

## 2024-02-29 DIAGNOSIS — Z122 Encounter for screening for malignant neoplasm of respiratory organs: Secondary | ICD-10-CM

## 2024-02-29 DIAGNOSIS — F17210 Nicotine dependence, cigarettes, uncomplicated: Secondary | ICD-10-CM

## 2024-03-03 ENCOUNTER — Other Ambulatory Visit: Payer: Self-pay | Admitting: Physician Assistant

## 2024-03-03 ENCOUNTER — Ambulatory Visit
Admission: RE | Admit: 2024-03-03 | Discharge: 2024-03-03 | Disposition: A | Discharge status: Home / Self Care | Source: Ambulatory Visit | Attending: Physician Assistant | Admitting: Physician Assistant

## 2024-03-03 DIAGNOSIS — R9389 Abnormal findings on diagnostic imaging of other specified body structures: Secondary | ICD-10-CM

## 2024-03-03 DIAGNOSIS — M4802 Spinal stenosis, cervical region: Secondary | ICD-10-CM

## 2024-03-03 MED ORDER — METHYLPREDNISOLONE 4 MG PO TBPK: ORAL_TABLET | ORAL | 0 refills | Status: AC

## 2024-03-03 MED ORDER — GADOPICLENOL 0.5 MMOL/ML IV SOLN
10.0000 mL | Freq: Once | INTRAVENOUS | Status: AC | PRN
  Administered 2024-03-03: 8 mL via INTRAVENOUS

## 2024-03-06 ENCOUNTER — Ambulatory Visit: Payer: Self-pay | Admitting: Physician Assistant

## 2024-03-08 ENCOUNTER — Encounter: Payer: Self-pay | Admitting: Neurology

## 2024-03-08 ENCOUNTER — Ambulatory Visit: Admitting: Physician Assistant

## 2024-03-08 ENCOUNTER — Other Ambulatory Visit: Payer: Self-pay

## 2024-03-08 DIAGNOSIS — M50222 Other cervical disc displacement at C5-C6 level: Secondary | ICD-10-CM

## 2024-03-08 DIAGNOSIS — R202 Paresthesia of skin: Secondary | ICD-10-CM | POA: Diagnosis not present

## 2024-03-08 DIAGNOSIS — R200 Anesthesia of skin: Secondary | ICD-10-CM | POA: Diagnosis not present

## 2024-03-08 DIAGNOSIS — M5010 Cervical disc disorder with radiculopathy, unspecified cervical region: Secondary | ICD-10-CM

## 2024-03-08 DIAGNOSIS — R9389 Abnormal findings on diagnostic imaging of other specified body structures: Secondary | ICD-10-CM

## 2024-03-08 MED ORDER — GABAPENTIN 100 MG PO CAPS: 100.0000 mg | ORAL_CAPSULE | Freq: Two times a day (BID) | ORAL | 2 refills | Status: AC

## 2024-03-08 NOTE — Progress Notes (Signed)
 Discussed patient's MRI results over the phone.  She is endorsing increased pain, stiffness, muscle spasms in her neck.  She feels that her pain has become worse.  Denies any fevers, chills, other indications of infection.  She is having numbness and tingling in her left arm that she feels as though it is in all 5 digits.  Talk to the patient that I am concerned that this is more than just the disc herniation at C5-6 given it is involving all 5 fingers.  Want to see with an EMG if she has an active radiculopathy ongoing as well as a potential peripheral neuropathy that would account for numbness and tingling in all 5 digits.  In addition gabapentin  at night has been helpful.  Will also add in low-dose gabapentin , 100 mg 1-2 times during the day to help with pain control as well.  There still appears to be no indication of infection and the effusion is likely degenerative in nature.  He will continue to follow this.  She currently has 1 day left of her Medrol  Dosepak.  I encouraged her to make sure that she finishes this out.  Advised her to restart her Celebrex  once complete and continue physical therapy.   MRI CERVICAL SPINE WITH AND WITHOUT CONTRAST 03/03/2024 09:06:47 AM   TECHNIQUE: Multiplanar multisequence MRI of the cervical spine was performed without and with the administration of intravenous contrast.   CONTRAST: 8 ml of Vueway .   COMPARISON: MR Cervical spine without IV contrast 02/06/2024.   CLINICAL HISTORY: 64 year old female. Neck pain, chronic, degenerative changes on X-ray; concern for possible infectious versus degenerative changes. Progressive chronic neck and shoulder pain. Symptoms radiating down the left arm. Inflamed right C3-C4 facet with small posterior epidural fluid collection suspected on non-contrast MRI last month.   FINDINGS:   BONES AND ALIGNMENT: Mildly improved cervical lordosis. Normal vertebral body heights. Background marrow signal is  normal. Chronic and degenerative appearing marrow signal changes at the C5-C6 vertebral bodies and endplates.   Right greater than left C3-C4 facet edema and adjacent soft tissue STIR hyperintensity and enhancement persists (series 107 image 3). Right side facet joint fluid appears progressed since last month. The epidural space at that level remains abnormal, however is homogeneously enhancing following contrast (series 113 image 10) indicative of soft tissue thickening rather than fluid collection (T2 appearance stable on series 108 image 10, with the thickness on axial T2 mildly increased to 2 mm). Underlying chronic facet degeneration. Regional soft tissue inflammation has not changed. No progressive marrow edema. Mild spinal stenosis is stable (series 109 image 9). The soft tissue thickening there extends slightly cephalad and caudal. There is no superimposed widespread dural thickening or enhancement.   SPINAL CORD: Normal spinal cord size and signal. No abnormal intradural enhancement or cervical spinal cord signal abnormality. Negative visible cervicomedullary junction and posterior fossa.   SOFT TISSUES: Major vascular clivoids in the neck are preserved with a retropharyngeal course of both internal carotid arteries, normal variation. Negative visible thoracic inlet.   DEGENERATIVE:   Other cervical spine degeneration is stable from last month, including bulky chronic facet arthropathy on the left at C2-C3 and C5-C6.   IMPRESSION: 1. Unresolved Right greater than left C3-C4 facet and adjacent soft tissue edema/inflammation. Mildly progressed right facet joint fluid and epidural process (see #2), but lack of other progression in almost one month argues against an untreated spinal infection or septic facet(s). 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 But  a follow up MRI to document regression / resolution is recommended. 2. The right  greater than left epidural process has mildly increased at C3-C4, although is soft tissue and/or dural thickening and NOT a fluid collection. No evidence of abscess at this time. Mild overall C3-C4 spinal stenosis is stable. 3. Other cervical levels are stable.    This visit was performed via telephone.  Patient location: home Provider location: office  I spent a total of 15 minutes non-face-to-face activities for this visit on the date of this encounter including review of current clinical condition and response to treatment.  The patient is aware of and accepts the limits of this telehealth visit.

## 2024-03-13 LAB — GENECONNECT MOLECULAR SCREEN

## 2024-03-14 ENCOUNTER — Telehealth: Payer: Self-pay | Admitting: Medical Genetics

## 2024-03-14 NOTE — Telephone Encounter (Signed)
 SABRA

## 2024-04-29 ENCOUNTER — Encounter: Payer: Self-pay | Admitting: Neurology

## 2024-05-11 ENCOUNTER — Ambulatory Visit: Admitting: Rheumatology

## 2024-06-20 ENCOUNTER — Encounter: Admitting: Family Medicine

## 2024-06-22 ENCOUNTER — Ambulatory Visit: Admitting: Rheumatology

## 2024-07-27 ENCOUNTER — Ambulatory Visit: Admitting: Family Medicine
